# Patient Record
Sex: Male | Born: 1950 | Race: White | Hispanic: No | Smoking: Former smoker
Health system: Southern US, Community
[De-identification: ages and names within clinical notes are randomized; demographics above are authoritative.]

## PROBLEM LIST (undated history)

## (undated) ENCOUNTER — Emergency Department (HOSPITAL_COMMUNITY): Discharge status: Absent without leave | Payer: Self-pay

## (undated) DIAGNOSIS — I350 Nonrheumatic aortic (valve) stenosis: Secondary | ICD-10-CM

## (undated) DIAGNOSIS — M1990 Unspecified osteoarthritis, unspecified site: Secondary | ICD-10-CM

## (undated) DIAGNOSIS — I10 Essential (primary) hypertension: Secondary | ICD-10-CM

## (undated) DIAGNOSIS — I4891 Unspecified atrial fibrillation: Secondary | ICD-10-CM

## (undated) DIAGNOSIS — I700 Atherosclerosis of aorta: Secondary | ICD-10-CM

## (undated) DIAGNOSIS — K219 Gastro-esophageal reflux disease without esophagitis: Secondary | ICD-10-CM

## (undated) DIAGNOSIS — M109 Gout, unspecified: Secondary | ICD-10-CM

## (undated) DIAGNOSIS — I712 Thoracic aortic aneurysm, without rupture, unspecified: Secondary | ICD-10-CM

## (undated) DIAGNOSIS — Q2381 Bicuspid aortic valve: Secondary | ICD-10-CM

## (undated) DIAGNOSIS — Q231 Congenital insufficiency of aortic valve: Secondary | ICD-10-CM

## (undated) DIAGNOSIS — I9789 Other postprocedural complications and disorders of the circulatory system, not elsewhere classified: Principal | ICD-10-CM

## (undated) DIAGNOSIS — R0600 Dyspnea, unspecified: Secondary | ICD-10-CM

## (undated) DIAGNOSIS — I351 Nonrheumatic aortic (valve) insufficiency: Secondary | ICD-10-CM

## (undated) HISTORY — DX: Gastro-esophageal reflux disease without esophagitis: K21.9

## (undated) HISTORY — DX: Atherosclerosis of aorta: I70.0

## (undated) HISTORY — DX: Other postprocedural complications and disorders of the circulatory system, not elsewhere classified: I48.91

## (undated) HISTORY — DX: Nonrheumatic aortic (valve) stenosis: I35.0

## (undated) HISTORY — DX: Thoracic aortic aneurysm, without rupture: I71.2

## (undated) HISTORY — DX: Essential (primary) hypertension: I10

## (undated) HISTORY — DX: Bicuspid aortic valve: Q23.81

## (undated) HISTORY — PX: HERNIA REPAIR: SHX51

## (undated) HISTORY — DX: Other postprocedural complications and disorders of the circulatory system, not elsewhere classified: I97.89

## (undated) HISTORY — DX: Congenital insufficiency of aortic valve: Q23.1

## (undated) HISTORY — DX: Thoracic aortic aneurysm, without rupture, unspecified: I71.20

## (undated) HISTORY — DX: Nonrheumatic aortic (valve) insufficiency: I35.1

---

## 2012-11-26 ENCOUNTER — Ambulatory Visit: Payer: Self-pay | Admitting: Family Medicine

## 2012-12-17 ENCOUNTER — Ambulatory Visit: Payer: Self-pay | Admitting: Family Medicine

## 2013-06-17 ENCOUNTER — Emergency Department (HOSPITAL_COMMUNITY): Payer: Medicare Other

## 2013-06-17 ENCOUNTER — Encounter (HOSPITAL_COMMUNITY): Payer: Self-pay

## 2013-06-17 ENCOUNTER — Emergency Department (HOSPITAL_COMMUNITY)
Admission: EM | Admit: 2013-06-17 | Discharge: 2013-06-17 | Disposition: A | Discharge status: Home / Self Care | Payer: Medicare Other | Attending: Emergency Medicine | Admitting: Emergency Medicine

## 2013-06-17 DIAGNOSIS — G8929 Other chronic pain: Secondary | ICD-10-CM | POA: Insufficient documentation

## 2013-06-17 DIAGNOSIS — R141 Gas pain: Secondary | ICD-10-CM | POA: Insufficient documentation

## 2013-06-17 DIAGNOSIS — I10 Essential (primary) hypertension: Secondary | ICD-10-CM | POA: Insufficient documentation

## 2013-06-17 DIAGNOSIS — K219 Gastro-esophageal reflux disease without esophagitis: Secondary | ICD-10-CM | POA: Insufficient documentation

## 2013-06-17 DIAGNOSIS — R143 Flatulence: Secondary | ICD-10-CM | POA: Insufficient documentation

## 2013-06-17 DIAGNOSIS — R110 Nausea: Secondary | ICD-10-CM | POA: Insufficient documentation

## 2013-06-17 DIAGNOSIS — R1084 Generalized abdominal pain: Secondary | ICD-10-CM

## 2013-06-17 DIAGNOSIS — M109 Gout, unspecified: Secondary | ICD-10-CM | POA: Insufficient documentation

## 2013-06-17 DIAGNOSIS — Z79899 Other long term (current) drug therapy: Secondary | ICD-10-CM | POA: Insufficient documentation

## 2013-06-17 DIAGNOSIS — Z791 Long term (current) use of non-steroidal anti-inflammatories (NSAID): Secondary | ICD-10-CM | POA: Insufficient documentation

## 2013-06-17 DIAGNOSIS — M129 Arthropathy, unspecified: Secondary | ICD-10-CM | POA: Insufficient documentation

## 2013-06-17 DIAGNOSIS — R142 Eructation: Secondary | ICD-10-CM | POA: Insufficient documentation

## 2013-06-17 DIAGNOSIS — Z9889 Other specified postprocedural states: Secondary | ICD-10-CM | POA: Insufficient documentation

## 2013-06-17 HISTORY — DX: Gout, unspecified: M10.9

## 2013-06-17 HISTORY — DX: Unspecified osteoarthritis, unspecified site: M19.90

## 2013-06-17 LAB — COMPREHENSIVE METABOLIC PANEL WITH GFR
ALT: 52 U/L (ref 0–53)
AST: 34 U/L (ref 0–37)
Albumin: 4.1 g/dL (ref 3.5–5.2)
Alkaline Phosphatase: 97 U/L (ref 39–117)
BUN: 24 mg/dL — ABNORMAL HIGH (ref 6–23)
CO2: 31 meq/L (ref 19–32)
Calcium: 9.8 mg/dL (ref 8.4–10.5)
Chloride: 102 meq/L (ref 96–112)
Creatinine, Ser: 1.19 mg/dL (ref 0.50–1.35)
GFR calc Af Amer: 74 mL/min — ABNORMAL LOW
GFR calc non Af Amer: 64 mL/min — ABNORMAL LOW
Glucose, Bld: 107 mg/dL — ABNORMAL HIGH (ref 70–99)
Potassium: 4.6 meq/L (ref 3.5–5.1)
Sodium: 139 meq/L (ref 135–145)
Total Bilirubin: 0.8 mg/dL (ref 0.3–1.2)
Total Protein: 7 g/dL (ref 6.0–8.3)

## 2013-06-17 LAB — CBC WITH DIFFERENTIAL/PLATELET
Basophils Absolute: 0.1 10*3/uL (ref 0.0–0.1)
Basophils Relative: 1 % (ref 0–1)
Eosinophils Absolute: 0.1 10*3/uL (ref 0.0–0.7)
Eosinophils Relative: 2 % (ref 0–5)
HCT: 46.9 % (ref 39.0–52.0)
Hemoglobin: 16 g/dL (ref 13.0–17.0)
Lymphocytes Relative: 52 % — ABNORMAL HIGH (ref 12–46)
Lymphs Abs: 2.8 10*3/uL (ref 0.7–4.0)
MCH: 32.2 pg (ref 26.0–34.0)
MCHC: 34.1 g/dL (ref 30.0–36.0)
MCV: 94.4 fL (ref 78.0–100.0)
Monocytes Absolute: 0.5 10*3/uL (ref 0.1–1.0)
Monocytes Relative: 9 % (ref 3–12)
Neutro Abs: 1.9 10*3/uL (ref 1.7–7.7)
Neutrophils Relative %: 36 % — ABNORMAL LOW (ref 43–77)
Platelets: 218 10*3/uL (ref 150–400)
RBC: 4.97 MIL/uL (ref 4.22–5.81)
RDW: 13.9 % (ref 11.5–15.5)
WBC: 5.3 10*3/uL (ref 4.0–10.5)

## 2013-06-17 LAB — URINALYSIS, ROUTINE W REFLEX MICROSCOPIC
Bilirubin Urine: NEGATIVE
Glucose, UA: NEGATIVE mg/dL
Hgb urine dipstick: NEGATIVE
Ketones, ur: NEGATIVE mg/dL
Leukocytes, UA: NEGATIVE
Nitrite: NEGATIVE
Protein, ur: NEGATIVE mg/dL
Specific Gravity, Urine: 1.01 (ref 1.005–1.030)
Urobilinogen, UA: 0.2 mg/dL (ref 0.0–1.0)
pH: 6 (ref 5.0–8.0)

## 2013-06-17 LAB — LIPASE, BLOOD: Lipase: 24 U/L (ref 11–59)

## 2013-06-17 MED ORDER — HYOSCYAMINE SULFATE 0.125 MG SL SUBL: 0.1250 mg | SUBLINGUAL_TABLET | SUBLINGUAL | Status: DC | PRN

## 2013-06-17 MED ORDER — IOHEXOL 300 MG/ML  SOLN
100.0000 mL | Freq: Once | INTRAMUSCULAR | Status: AC | PRN
  Administered 2013-06-17: 100 mL via INTRAVENOUS

## 2013-06-17 MED ORDER — FAMOTIDINE 40 MG PO TABS: 20.0000 mg | ORAL_TABLET | Freq: Two times a day (BID) | ORAL | Status: DC

## 2013-06-17 MED ORDER — IOHEXOL 300 MG/ML  SOLN
50.0000 mL | Freq: Once | INTRAMUSCULAR | Status: AC | PRN
  Administered 2013-06-17: 50 mL via ORAL

## 2013-06-17 NOTE — ED Notes (Signed)
Pt complain of abdominal pain that is chronic in nature. Also, complain of nausea

## 2013-06-17 NOTE — ED Provider Notes (Signed)
Medical screening examination/treatment/procedure(s) were performed by non-physician practitioner and as supervising physician I was immediately available for consultation/collaboration.  Jamilyn Pigeon, MD 06/17/13 1335 

## 2013-06-17 NOTE — ED Provider Notes (Signed)
CSN: [id number]     Arrival date & time 06/17/13  7829 History     First MD Initiated Contact with Patient 06/17/13 [phone]     Chief Complaint  Patient presents with  . Abdominal Pain   (Consider location/radiation/quality/duration/timing/severity/associated sxs/prior Treatment) HPI Comments: Lawrence [name] is a 62 y.o. Male presenting with intermittent episodes of generalized abdominal cramping pain,  Stating it often wakes him at night,  Last occurred last night lasting several hours.  He describes abdominal distention and sometimes increased flatulence and nausea during these episodes, occurs about every other night on average for the past 2 months.  He was told he has a liver "problem" as he used to be a heavy drinker,  But not currently.  He denies bowel changes, no constipation, diarrhea or bloody stools,  Last bm was yesterday and normal.  He has found no alleviators and no pattern to episodes or food intake, no recent changes in diet.  He denies chest pain and shortness of breath.  He has taken no medicines for his symptoms and last felt this complaint last night.     The history is provided by the patient.    Past Medical History  Diagnosis Date  . Arthritis   . Hypertension   . Acid reflux disease   . Gout    Past Surgical History  Procedure Laterality Date  . Hernia repair     No family history on file. History  Substance Use Topics  . Smoking status: Never Smoker   . Smokeless tobacco: Not on file  . Alcohol Use: Yes    Review of Systems  Constitutional: Negative for fever, activity change and appetite change.  HENT: Negative for congestion, sore throat and neck pain.   Eyes: Negative.   Respiratory: Negative for chest tightness and shortness of breath.   Cardiovascular: Negative for chest pain.  Gastrointestinal: Positive for nausea, abdominal pain and abdominal distention. Negative for vomiting, diarrhea and constipation.  Genitourinary: Negative.    Musculoskeletal: Negative for joint swelling and arthralgias.  Skin: Negative.  Negative for rash and wound.  Neurological: Negative for dizziness, weakness, light-headedness, numbness and headaches.  Psychiatric/Behavioral: Negative.     Allergies  Morphine and related  Home Medications   Current Outpatient Rx  Name  Route  Sig  Dispense  Refill  . allopurinol (ZYLOPRIM) 300 MG tablet   Oral   Take 300 mg by mouth daily.         . cyclobenzaprine (FLEXERIL) 5 MG tablet   Oral   Take 5 mg by mouth 3 (three) times daily as needed for muscle spasms.         Marland Kitchen ibuprofen (ADVIL,MOTRIN) 600 MG tablet   Oral   Take 600 mg by mouth every 6 (six) hours as needed for pain.         Marland Kitchen lisinopril (PRINIVIL,ZESTRIL) 20 MG tablet   Oral   Take 20 mg by mouth daily.         . Multiple Vitamin (MULTIVITAMIN WITH MINERALS) TABS tablet   Oral   Take 1 tablet by mouth daily.         . famotidine (PEPCID) 40 MG tablet   Oral   Take 0.5 tablets (20 mg total) by mouth 2 (two) times daily.   28 tablet   0   . hyoscyamine (LEVSIN/SL) 0.125 MG SL tablet   Sublingual   Place 1 tablet (0.125 mg total) under the tongue every 4 (four) hours  as needed for cramping.   30 tablet   0    BP 138/86  Pulse 75  Temp(Src) 98.6 F (37 C) (Oral)  Resp 18  Ht 6\' 1"  (1.854 m)  Wt 214 lb (97.07 kg)  BMI 28.24 kg/m2  SpO2 98% Physical Exam  Nursing note and vitals reviewed. Constitutional: He appears well-developed and well-nourished.  HENT:  Head: Normocephalic and atraumatic.  Eyes: Conjunctivae are normal.  Neck: Normal range of motion.  Cardiovascular: Normal rate, regular rhythm, normal heart sounds and intact distal pulses.   Pulmonary/Chest: Effort normal and breath sounds normal. He has no wheezes.  Abdominal: Soft. He exhibits distension. He exhibits no pulsatile liver, no fluid wave, no abdominal bruit, no pulsatile midline mass and no mass. Bowel sounds are increased. There  is no hepatosplenomegaly. There is no tenderness. There is no rebound, no guarding and no CVA tenderness.  Musculoskeletal: Normal range of motion.  Neurological: He is alert.  Skin: Skin is warm and dry.  Psychiatric: He has a normal mood and affect.    ED Course   Procedures (including critical care time)  Labs Reviewed  CBC WITH DIFFERENTIAL - Abnormal; Notable for the following:    Neutrophils Relative % 36 (*)    Lymphocytes Relative 52 (*)    All other components within normal limits  COMPREHENSIVE METABOLIC PANEL - Abnormal; Notable for the following:    Glucose, Bld 107 (*)    BUN 24 (*)    GFR calc non Af Amer 64 (*)    GFR calc Af Amer 74 (*)    All other components within normal limits  LIPASE, BLOOD  URINALYSIS, ROUTINE W REFLEX MICROSCOPIC   Ct Abdomen Pelvis W Contrast  06/17/2013   *RADIOLOGY REPORT*  Clinical Data: Abdomen pain  CT ABDOMEN AND PELVIS WITH CONTRAST  Technique:  Multidetector CT imaging of the abdomen and pelvis was performed following the standard protocol during bolus administration of intravenous contrast.  Contrast: 50mL OMNIPAQUE IOHEXOL 300 MG/ML  SOLN, OMNIPAQUE IOHEXOL 300 MG/ML  SOLN  Comparison: None.  Findings: There is diffuse fatty infiltration of liver.  The liver is otherwise normal.  The gallbladder, spleen, pancreas, adrenal glands, kidneys are normal.  There is mild chronic bilateral perinephric stranding.  There is no hydronephrosis bilaterally. There is atherosclerosis of the abdominal aorta without aneurysmal dilatation.  There is no abdominal lymphadenopathy.  There is no small bowel obstruction or diverticulitis.  The appendix is normal.  Images of the pelvis demonstrate fluid filled bladder without abnormality.  There is minor dependent atelectasis of the visualized posterior lungs.  There are degenerative joint changes of the spine.  No acute abnormality is identified in the visualized bony structures.  IMPRESSION: No acute  abnormality identified in the abdomen and pelvis.  Diffuse fatty infiltration of liver.   Original Report Authenticated By: Sherian Rein, M.D.   1. Abdominal pain, chronic, generalized     MDM  Discussed with Dr. Judd Lien prior to dc home.  Prescribed pepcid, hyoscyamine,  Encouraged f/u with GI for consideration of colonoscopy.  Patients labs and/or radiological studies were viewed and considered during the medical decision making and disposition process.  The patient appears reasonably screened and/or stabilized for discharge and I doubt any other medical condition or other [facility] requiring further screening, evaluation, or treatment in the ED at this time prior to discharge.   Burgess Amor, PA-C 06/17/13 1323

## 2014-11-18 DIAGNOSIS — N529 Male erectile dysfunction, unspecified: Secondary | ICD-10-CM | POA: Diagnosis not present

## 2014-11-18 DIAGNOSIS — I10 Essential (primary) hypertension: Secondary | ICD-10-CM | POA: Diagnosis not present

## 2014-11-18 DIAGNOSIS — M545 Low back pain: Secondary | ICD-10-CM | POA: Diagnosis not present

## 2014-11-22 DIAGNOSIS — E790 Hyperuricemia without signs of inflammatory arthritis and tophaceous disease: Secondary | ICD-10-CM | POA: Diagnosis not present

## 2014-11-22 DIAGNOSIS — I10 Essential (primary) hypertension: Secondary | ICD-10-CM | POA: Diagnosis not present

## 2014-12-11 DIAGNOSIS — Z23 Encounter for immunization: Secondary | ICD-10-CM | POA: Diagnosis not present

## 2014-12-11 DIAGNOSIS — S61211A Laceration without foreign body of left index finger without damage to nail, initial encounter: Secondary | ICD-10-CM | POA: Diagnosis not present

## 2014-12-11 DIAGNOSIS — W270XXA Contact with workbench tool, initial encounter: Secondary | ICD-10-CM | POA: Diagnosis not present

## 2014-12-11 DIAGNOSIS — S60410A Abrasion of right index finger, initial encounter: Secondary | ICD-10-CM | POA: Diagnosis not present

## 2014-12-11 DIAGNOSIS — S60411A Abrasion of left index finger, initial encounter: Secondary | ICD-10-CM | POA: Diagnosis not present

## 2014-12-11 DIAGNOSIS — Z79899 Other long term (current) drug therapy: Secondary | ICD-10-CM | POA: Diagnosis not present

## 2014-12-11 DIAGNOSIS — I10 Essential (primary) hypertension: Secondary | ICD-10-CM | POA: Diagnosis not present

## 2014-12-11 DIAGNOSIS — Z87891 Personal history of nicotine dependence: Secondary | ICD-10-CM | POA: Diagnosis not present

## 2015-05-23 DIAGNOSIS — N529 Male erectile dysfunction, unspecified: Secondary | ICD-10-CM | POA: Diagnosis not present

## 2015-05-23 DIAGNOSIS — I10 Essential (primary) hypertension: Secondary | ICD-10-CM | POA: Diagnosis not present

## 2015-05-29 DIAGNOSIS — E782 Mixed hyperlipidemia: Secondary | ICD-10-CM | POA: Diagnosis not present

## 2015-05-29 DIAGNOSIS — D72829 Elevated white blood cell count, unspecified: Secondary | ICD-10-CM | POA: Diagnosis not present

## 2015-05-29 DIAGNOSIS — R7301 Impaired fasting glucose: Secondary | ICD-10-CM | POA: Diagnosis not present

## 2015-05-29 DIAGNOSIS — I10 Essential (primary) hypertension: Secondary | ICD-10-CM | POA: Diagnosis not present

## 2015-08-24 DIAGNOSIS — Q8500 Neurofibromatosis, unspecified: Secondary | ICD-10-CM | POA: Diagnosis not present

## 2015-08-24 DIAGNOSIS — L720 Epidermal cyst: Secondary | ICD-10-CM | POA: Diagnosis not present

## 2015-09-02 DIAGNOSIS — J302 Other seasonal allergic rhinitis: Secondary | ICD-10-CM | POA: Diagnosis not present

## 2015-09-27 DIAGNOSIS — L720 Epidermal cyst: Secondary | ICD-10-CM | POA: Diagnosis not present

## 2015-11-22 DIAGNOSIS — E7800 Pure hypercholesterolemia, unspecified: Secondary | ICD-10-CM | POA: Diagnosis not present

## 2015-11-22 DIAGNOSIS — D72829 Elevated white blood cell count, unspecified: Secondary | ICD-10-CM | POA: Diagnosis not present

## 2015-11-22 DIAGNOSIS — I10 Essential (primary) hypertension: Secondary | ICD-10-CM | POA: Diagnosis not present

## 2015-11-22 DIAGNOSIS — R7301 Impaired fasting glucose: Secondary | ICD-10-CM | POA: Diagnosis not present

## 2015-12-01 DIAGNOSIS — E782 Mixed hyperlipidemia: Secondary | ICD-10-CM | POA: Diagnosis not present

## 2015-12-01 DIAGNOSIS — M25559 Pain in unspecified hip: Secondary | ICD-10-CM | POA: Diagnosis not present

## 2015-12-01 DIAGNOSIS — R7301 Impaired fasting glucose: Secondary | ICD-10-CM | POA: Diagnosis not present

## 2015-12-01 DIAGNOSIS — I10 Essential (primary) hypertension: Secondary | ICD-10-CM | POA: Diagnosis not present

## 2016-02-13 DIAGNOSIS — I10 Essential (primary) hypertension: Secondary | ICD-10-CM | POA: Diagnosis not present

## 2016-06-10 DIAGNOSIS — E782 Mixed hyperlipidemia: Secondary | ICD-10-CM | POA: Diagnosis not present

## 2016-06-10 DIAGNOSIS — R7301 Impaired fasting glucose: Secondary | ICD-10-CM | POA: Diagnosis not present

## 2016-06-12 DIAGNOSIS — I10 Essential (primary) hypertension: Secondary | ICD-10-CM | POA: Diagnosis not present

## 2016-06-12 DIAGNOSIS — M25559 Pain in unspecified hip: Secondary | ICD-10-CM | POA: Diagnosis not present

## 2016-06-12 DIAGNOSIS — M542 Cervicalgia: Secondary | ICD-10-CM | POA: Diagnosis not present

## 2016-06-12 DIAGNOSIS — E782 Mixed hyperlipidemia: Secondary | ICD-10-CM | POA: Diagnosis not present

## 2016-06-12 DIAGNOSIS — Z86010 Personal history of colonic polyps: Secondary | ICD-10-CM | POA: Diagnosis not present

## 2016-06-12 DIAGNOSIS — R7301 Impaired fasting glucose: Secondary | ICD-10-CM | POA: Diagnosis not present

## 2016-07-01 ENCOUNTER — Telehealth: Payer: Self-pay

## 2016-07-01 NOTE — Telephone Encounter (Signed)
PT left Vm he is ready to schedule the colonoscopy. Can call back this afternoon.

## 2016-07-04 ENCOUNTER — Telehealth: Payer: Self-pay | Admitting: Gastroenterology

## 2016-07-04 NOTE — Telephone Encounter (Signed)
Pt was returning a call for DS. Please call [phone]

## 2016-07-09 ENCOUNTER — Telehealth: Payer: Self-pay

## 2016-07-09 NOTE — Telephone Encounter (Signed)
I called pt and he has been having colonoscopies every 3-5 years and at different places. He has a history of polyps. He will try to get some of the records and call me back to schedule an OV appt.

## 2016-07-09 NOTE — Telephone Encounter (Signed)
Pt is needing to be triaged. He said he hasn't received a call from nurse yet. Please call [phone]

## 2016-07-09 NOTE — Telephone Encounter (Signed)
See previous note. I have contacted the pt.

## 2016-07-10 NOTE — Telephone Encounter (Signed)
See separate note, he has OV appt.

## 2016-07-10 NOTE — Telephone Encounter (Signed)
Pt came by the office and signed release for previous colonoscopy reports. I have scheduled him an OV with Walden Field, NP on 08/01/2016 at 9:30 AM.

## 2016-08-01 ENCOUNTER — Ambulatory Visit (INDEPENDENT_AMBULATORY_CARE_PROVIDER_SITE_OTHER): Payer: Commercial Managed Care - HMO | Admitting: Nurse Practitioner

## 2016-08-01 ENCOUNTER — Encounter: Payer: Self-pay | Admitting: Nurse Practitioner

## 2016-08-01 DIAGNOSIS — Z86010 Personal history of colonic polyps: Secondary | ICD-10-CM | POA: Insufficient documentation

## 2016-08-01 NOTE — Progress Notes (Addendum)
REVIEWED-NO ADDITIONAL RECOMMENDATIONS.Marland Kitchen  Primary Care Physician:  Wende Neighbors, MD Primary Gastroenterologist:  Dr. Oneida Alar  Chief Complaint  Patient presents with  . Colonoscopy    hx polyps    HPI:   Lawrence [name] is a 65 y.o. male who presents to schedule colonoscopy. He is recently moved to this area. Last colonoscopy completed up [place]. Review of records finds last colonoscopy and treated on 01/21/2012 which found a transverse colon polyp which was removed and sent to pathology and found tubular adenoma without carcinoma. Recommended repeat colonoscopy every 3 years due to family history.  Today he states he's doing wel overall. Some RLQ abdominal pain in the past couple days, feels like when he had a hernia previously. Denies N/V, hematochezia, melena, fever, chills, acute changes in bowel habits. GERD symptoms well controlled currently without PPI or H2RA. Denies chest pain, dyspnea, dizziness, lightheadedness, syncope, near syncope. Denies any other upper or lower GI symptoms.  Past Medical History:  Diagnosis Date  . Acid reflux disease   . Arthritis   . Gout   . Hypertension     Past Surgical History:  Procedure Laterality Date  . HERNIA REPAIR      Current Outpatient Prescriptions  Medication Sig Dispense Refill  . allopurinol (ZYLOPRIM) 300 MG tablet Take 300 mg by mouth as needed.     . cyclobenzaprine (FLEXERIL) 5 MG tablet Take 5 mg by mouth 3 (three) times daily as needed for muscle spasms.    Marland Kitchen ibuprofen (ADVIL,MOTRIN) 600 MG tablet Take 600 mg by mouth every 6 (six) hours as needed for pain.    Marland Kitchen lisinopril (PRINIVIL,ZESTRIL) 20 MG tablet Take 20 mg by mouth daily.    . famotidine (PEPCID) 40 MG tablet Take 0.5 tablets (20 mg total) by mouth 2 (two) times daily. (Patient not taking: Reported on 08/01/2016) 28 tablet 0  . hyoscyamine (LEVSIN/SL) 0.125 MG SL tablet Place 1 tablet (0.125 mg total) under the tongue every 4 (four) hours as needed for cramping.  (Patient not taking: Reported on 08/01/2016) 30 tablet 0  . Multiple Vitamin (MULTIVITAMIN WITH MINERALS) TABS tablet Take 1 tablet by mouth daily.     No current facility-administered medications for this visit.     Allergies as of 08/01/2016 - Review Complete 08/01/2016  Allergen Reaction Noted  . Morphine and related Other (See Comments) 06/17/2013    No family history on file.  Social History   Social History  . Marital status: Single    Spouse name: N/A  . Number of children: N/A  . Years of education: N/A   Occupational History  . Not on file.   Social History Main Topics  . Smoking status: Never Smoker  . Smokeless tobacco: Not on file  . Alcohol use Yes  . Drug use: No  . Sexual activity: Not on file   Other Topics Concern  . Not on file   Social History Narrative  . No narrative on file    Review of Systems: 10-point ROS negative except as per HPI.    Physical Exam: BP (!) 156/96   Pulse (!) 53   Temp 97.6 F (36.4 C) (Oral)   Ht 5\' 11"  (1.803 m)   Wt 208 lb 12.8 oz (94.7 kg)   BMI 29.12 kg/m  General:   Alert and oriented. Pleasant and cooperative. Well-nourished and well-developed.  Head:  Normocephalic and atraumatic. Eyes:  Without icterus, sclera clear and conjunctiva pink.  Ears:  Normal auditory acuity.  Cardiovascular:  S1, S2 present without murmurs appreciated. Extremities without clubbing or edema. Respiratory:  Clear to auscultation bilaterally. No wheezes, rales, or rhonchi. No distress.  Gastrointestinal:  +BS, soft, non-tender and non-distended. No HSM noted. No guarding or rebound. No masses appreciated.  Rectal:  Deferred  Musculoskalatal:  Symmetrical without gross deformities. Neurologic:  Alert and oriented x4;  grossly normal neurologically. Psych:  Alert and cooperative. Normal mood and affect. Heme/Lymph/Immune: No excessive bruising noted.    08/01/2016 9:39 AM   Disclaimer: This note was dictated with voice  recognition software. Similar sounding words can inadvertently be transcribed and may not be corrected upon review.

## 2016-08-01 NOTE — Patient Instructions (Signed)
1. We will schedule your procedure for you. 2. Return for follow-up based on postprocedure recommendations. 

## 2016-08-01 NOTE — Progress Notes (Signed)
CC'ED TO PCP 

## 2016-08-01 NOTE — Assessment & Plan Note (Signed)
The patient has a personal history of colon polyps. Last removed and 2013 found to be tubular adenoma. Recommended 3 year repeat colonoscopy, he is currently about 1 year overdue. At this point we will proceed with recommended repeat colonoscopy. Generally asymptomatic from a GI standpoint.  Proceed with colonoscopy with Dr. Oneida Alar in the near future. The risks, benefits, and alternatives have been discussed in detail with the patient. They state understanding and desire to proceed.   The patient is not on any anticoagulants, anxiolytics, chronic pain medications, or antidepressants. He does drink occasionally, typically while watching a football game or for sporting events with up to 4 drinks per sitting. We will add 25 mg preprocedure Phenergan to promote adequate sedation.

## 2016-08-05 ENCOUNTER — Other Ambulatory Visit: Payer: Self-pay

## 2016-08-05 DIAGNOSIS — Z86010 Personal history of colonic polyps: Secondary | ICD-10-CM

## 2016-08-05 MED ORDER — PEG 3350-KCL-NA BICARB-NACL 420 G PO SOLR: 4000.0000 mL | ORAL | 0 refills | Status: DC

## 2016-08-29 ENCOUNTER — Encounter (HOSPITAL_COMMUNITY)
Admission: RE | Disposition: A | Discharge status: Home / Self Care | Payer: Self-pay | Source: Ambulatory Visit | Attending: Gastroenterology

## 2016-08-29 ENCOUNTER — Ambulatory Visit (HOSPITAL_COMMUNITY)
Admission: RE | Admit: 2016-08-29 | Discharge: 2016-08-29 | Disposition: A | Discharge status: Home / Self Care | Payer: Commercial Managed Care - HMO | Source: Ambulatory Visit | Attending: Gastroenterology | Admitting: Gastroenterology

## 2016-08-29 ENCOUNTER — Encounter (HOSPITAL_COMMUNITY): Payer: Self-pay | Admitting: *Deleted

## 2016-08-29 DIAGNOSIS — Z1211 Encounter for screening for malignant neoplasm of colon: Secondary | ICD-10-CM | POA: Insufficient documentation

## 2016-08-29 DIAGNOSIS — Z87891 Personal history of nicotine dependence: Secondary | ICD-10-CM | POA: Insufficient documentation

## 2016-08-29 DIAGNOSIS — D123 Benign neoplasm of transverse colon: Secondary | ICD-10-CM

## 2016-08-29 DIAGNOSIS — M109 Gout, unspecified: Secondary | ICD-10-CM | POA: Insufficient documentation

## 2016-08-29 DIAGNOSIS — K648 Other hemorrhoids: Secondary | ICD-10-CM | POA: Diagnosis not present

## 2016-08-29 DIAGNOSIS — Z79899 Other long term (current) drug therapy: Secondary | ICD-10-CM | POA: Insufficient documentation

## 2016-08-29 DIAGNOSIS — Q438 Other specified congenital malformations of intestine: Secondary | ICD-10-CM | POA: Insufficient documentation

## 2016-08-29 DIAGNOSIS — K5730 Diverticulosis of large intestine without perforation or abscess without bleeding: Secondary | ICD-10-CM | POA: Insufficient documentation

## 2016-08-29 DIAGNOSIS — Z86010 Personal history of colonic polyps: Secondary | ICD-10-CM | POA: Diagnosis not present

## 2016-08-29 DIAGNOSIS — I10 Essential (primary) hypertension: Secondary | ICD-10-CM | POA: Insufficient documentation

## 2016-08-29 HISTORY — PX: COLONOSCOPY: SHX5424

## 2016-08-29 SURGERY — COLONOSCOPY
Anesthesia: Moderate Sedation

## 2016-08-29 MED ORDER — PROMETHAZINE HCL 25 MG/ML IJ SOLN
25.0000 mg | Freq: Once | INTRAMUSCULAR | Status: AC
  Administered 2016-08-29: 25 mg via INTRAVENOUS

## 2016-08-29 MED ORDER — SODIUM CHLORIDE 0.9% FLUSH
INTRAVENOUS | Status: AC
  Filled 2016-08-29: qty 10

## 2016-08-29 MED ORDER — MIDAZOLAM HCL 5 MG/5ML IJ SOLN
INTRAMUSCULAR | Status: AC
  Filled 2016-08-29: qty 10

## 2016-08-29 MED ORDER — MEPERIDINE HCL 100 MG/ML IJ SOLN
INTRAMUSCULAR | Status: DC | PRN
  Administered 2016-08-29 (×3): 25 mg via INTRAVENOUS

## 2016-08-29 MED ORDER — SODIUM CHLORIDE 0.9 % IV SOLN
INTRAVENOUS | Status: DC
  Administered 2016-08-29: 10:00:00 via INTRAVENOUS

## 2016-08-29 MED ORDER — PROMETHAZINE HCL 25 MG/ML IJ SOLN
INTRAMUSCULAR | Status: AC
  Filled 2016-08-29: qty 1

## 2016-08-29 MED ORDER — STERILE WATER FOR IRRIGATION IR SOLN
Status: DC | PRN
  Administered 2016-08-29: 2.5 mL

## 2016-08-29 MED ORDER — MIDAZOLAM HCL 5 MG/5ML IJ SOLN
INTRAMUSCULAR | Status: DC | PRN
  Administered 2016-08-29 (×3): 2 mg via INTRAVENOUS

## 2016-08-29 MED ORDER — MEPERIDINE HCL 100 MG/ML IJ SOLN
INTRAMUSCULAR | Status: AC
  Filled 2016-08-29: qty 2

## 2016-08-29 NOTE — Discharge Instructions (Signed)
You have MODERATE linternal hemorrhoids. YOU HAVE diverticulosis IN YOUR LEFT COLON. YOU HAD ONE POLYP REMOVED. YOU HAVE AN EXTREMELY REDUNDANT LEFT COLON. YOUR PREP IN THE RIGHT COLON WAS LESS THAN IDEAL.    DRINK WATER TO KEEP YOUR URINE LIGHT YELLOW.  FOLLOW A HIGH FIBER DIET. AVOID ITEMS THAT CAUSE BLOATING. See info below.  CONTINUE YOUR WEIGHT LOSS EFFORTS. LOSE TEN POUNDS.  USE PREPARATION H FOUR TIMES  A DAY IF NEEDED TO RELIEVE RECTAL PAIN/PRESSURE/BLEEDING.   YOUR BIOPSY RESULTS WILL BE AVAILABLE IN MY CHART  OCT 23 AND MY OFFICE WILL CONTACT YOU IN 10-14 DAYS WITH YOUR RESULTS.   Next colonoscopy in 1-3 years.  Colonoscopy Care After Read the instructions outlined below and refer to this sheet in the next week. These discharge instructions provide you with general information on caring for yourself after you leave the hospital. While your treatment has been planned according to the most current medical practices available, unavoidable complications occasionally occur. If you have any problems or questions after discharge, call DR. Cherith Tewell, [phone].  ACTIVITY  You may resume your regular activity, but move at a slower pace for the next 24 hours.   Take frequent rest periods for the next 24 hours.   Walking will help get rid of the air and reduce the bloated feeling in your belly (abdomen).   No driving for 24 hours (because of the medicine (anesthesia) used during the test).   You may shower.   Do not sign any important legal documents or operate any machinery for 24 hours (because of the anesthesia used during the test).    NUTRITION  Drink plenty of fluids.   You may resume your normal diet as instructed by your doctor.   Begin with a light meal and progress to your normal diet. Heavy or fried foods are harder to digest and may make you feel sick to your stomach (nauseated).   Avoid alcoholic beverages for 24 hours or as instructed.    MEDICATIONS  You  may resume your normal medications.   WHAT YOU CAN EXPECT TODAY  Some feelings of bloating in the abdomen.   Passage of more gas than usual.   Spotting of blood in your stool or on the toilet paper  .  IF YOU HAD POLYPS REMOVED DURING THE COLONOSCOPY:  Eat a soft diet IF YOU HAVE NAUSEA, BLOATING, ABDOMINAL PAIN, OR VOMITING.    FINDING OUT THE RESULTS OF YOUR TEST Not all test results are available during your visit. DR. Oneida Alar WILL CALL YOU WITHIN 14 DAYS OF YOUR PROCEDUE WITH YOUR RESULTS. Do not assume everything is normal if you have not heard from DR. Rivka Baune, CALL HER OFFICE AT [phone].  SEEK IMMEDIATE MEDICAL ATTENTION AND CALL THE OFFICE: [phone] IF:  You have more than a spotting of blood in your stool.   Your belly is swollen (abdominal distention).   You are nauseated or vomiting.   You have a temperature over 101F.   You have abdominal pain or discomfort that is severe or gets worse throughout the day.  High-Fiber Diet A high-fiber diet changes your normal diet to include more whole grains, legumes, fruits, and vegetables. Changes in the diet involve replacing refined carbohydrates with unrefined foods. The calorie level of the diet is essentially unchanged. The Dietary Reference Intake (recommended amount) for adult males is 38 grams per day. For adult females, it is 25 grams per day. Pregnant and lactating women should consume 28 grams of  fiber per day. Fiber is the intact part of a plant that is not broken down during digestion. Functional fiber is fiber that has been isolated from the plant to provide a beneficial effect in the body. PURPOSE  Increase stool bulk.   Ease and regulate bowel movements.   Lower cholesterol.  REDUCE RISK OF COLON CANCER  INDICATIONS THAT YOU NEED MORE FIBER  Constipation and hemorrhoids.   Uncomplicated diverticulosis (intestine condition) and irritable bowel syndrome.   Weight management.   As a protective  measure against hardening of the arteries (atherosclerosis), diabetes, and cancer.   GUIDELINES FOR INCREASING FIBER IN THE DIET  Start adding fiber to the diet slowly. A gradual increase of about 5 more grams (2 slices of whole-wheat bread, 2 servings of most fruits or vegetables, or 1 bowl of high-fiber cereal) per day is best. Too rapid an increase in fiber may result in constipation, flatulence, and bloating.   Drink enough water and fluids to keep your urine clear or pale yellow. Water, juice, or caffeine-free drinks are recommended. Not drinking enough fluid may cause constipation.   Eat a variety of high-fiber foods rather than one type of fiber.   Try to increase your intake of fiber through using high-fiber foods rather than fiber pills or supplements that contain small amounts of fiber.   The goal is to change the types of food eaten. Do not supplement your present diet with high-fiber foods, but replace foods in your present diet.   INCLUDE A VARIETY OF FIBER SOURCES  Replace refined and processed grains with whole grains, canned fruits with fresh fruits, and incorporate other fiber sources. White rice, white breads, and most bakery goods contain little or no fiber.   Brown whole-grain rice, buckwheat oats, and many fruits and vegetables are all good sources of fiber. These include: broccoli, Brussels sprouts, cabbage, cauliflower, beets, sweet potatoes, white potatoes (skin on), carrots, tomatoes, eggplant, squash, berries, fresh fruits, and dried fruits.   Cereals appear to be the richest source of fiber. Cereal fiber is found in whole grains and bran. Bran is the fiber-rich outer coat of cereal grain, which is largely removed in refining. In whole-grain cereals, the bran remains. In breakfast cereals, the largest amount of fiber is found in those with "bran" in their names. The fiber content is sometimes indicated on the label.   You may need to include additional fruits and  vegetables each day.   In baking, for 1 cup white flour, you may use the following substitutions:   1 cup whole-wheat flour minus 2 tablespoons.   1/2 cup white flour plus 1/2 cup whole-wheat flour.   Polyps, Colon  A polyp is extra tissue that grows inside your body. Colon polyps grow in the large intestine. The large intestine, also called the colon, is part of your digestive system. It is a long, hollow tube at the end of your digestive tract where your body makes and stores stool. Most polyps are not dangerous. They are benign. This means they are not cancerous. But over time, some types of polyps can turn into cancer. Polyps that are smaller than a pea are usually not harmful. But larger polyps could someday become or may already be cancerous. To be safe, doctors remove all polyps and test them.   PREVENTION There is not one sure way to prevent polyps. You might be able to lower your risk of getting them if you:  Eat more fruits and vegetables and less fatty  food.   Do not smoke.   Avoid alcohol.   Exercise every day.   Lose weight if you are overweight.   Eating more calcium and folate can also lower your risk of getting polyps. Some foods that are rich in calcium are milk, cheese, and broccoli. Some foods that are rich in folate are chickpeas, kidney beans, and spinach.    Diverticulosis Diverticulosis is a common condition that develops when small pouches (diverticula) form in the wall of the colon. The risk of diverticulosis increases with age. It happens more often in people who eat a low-fiber diet. Most individuals with diverticulosis have no symptoms. Those individuals with symptoms usually experience belly (abdominal) pain, constipation, or loose stools (diarrhea).  HOME CARE INSTRUCTIONS  Increase the amount of fiber in your diet as directed by your caregiver or dietician. This may reduce symptoms of diverticulosis.   Drink at least 6 to 8 glasses of water each day  to prevent constipation.   Try not to strain when you have a bowel movement.   Avoiding nuts and seeds to prevent complications is NOT NECESSARY.     FOODS HAVING HIGH FIBER CONTENT INCLUDE:  Fruits. Apple, peach, pear, tangerine, raisins, prunes.   Vegetables. Brussels sprouts, asparagus, broccoli, cabbage, carrot, cauliflower, romaine lettuce, spinach, summer squash, tomato, winter squash, zucchini.   Starchy Vegetables. Baked beans, kidney beans, lima beans, split peas, lentils, potatoes (with skin).   Grains. Whole wheat bread, brown rice, bran flake cereal, plain oatmeal, white rice, shredded wheat, bran muffins.    SEEK IMMEDIATE MEDICAL CARE IF:  You develop increasing pain or severe bloating.   You have an oral temperature above 101F.   You develop vomiting or bowel movements that are bloody or black.   Hemorrhoids Hemorrhoids are dilated (enlarged) veins around the rectum. Sometimes clots will form in the veins. This makes them swollen and painful. These are called thrombosed hemorrhoids. Causes of hemorrhoids include:  Constipation.   Straining to have a bowel movement.   HEAVY LIFTING  HOME CARE INSTRUCTIONS  Eat a well balanced diet and drink 6 to 8 glasses of water every day to avoid constipation. You may also use a bulk laxative.   Avoid straining to have bowel movements.   Keep anal area dry and clean.   Do not use a donut shaped pillow or sit on the toilet for long periods. This increases blood pooling and pain.   Move your bowels when your body has the urge; this will require less straining and will decrease pain and pressure.

## 2016-08-29 NOTE — H&P (Signed)
Primary Care Physician:  Wende Neighbors, MD Primary Gastroenterologist:  Dr. Oneida Alar  Pre-Procedure History & Physical: HPI:  Lawrence [name] is a 64 y.o. male here for COLON CANCER SCREENING.  Past Medical History:  Diagnosis Date  . Acid reflux disease   . Arthritis   . Gout   . Hypertension     Past Surgical History:  Procedure Laterality Date  . HERNIA REPAIR      Prior to Admission medications   Medication Sig Start Date End Date Taking? Authorizing Provider  allopurinol (ZYLOPRIM) 300 MG tablet Take 300 mg by mouth daily.    Yes Historical Provider, MD  cyclobenzaprine (FLEXERIL) 5 MG tablet Take 5 mg by mouth at bedtime as needed for muscle spasms.    Yes Historical Provider, MD  ibuprofen (ADVIL,MOTRIN) 600 MG tablet Take 600 mg by mouth every 6 (six) hours as needed for pain.   Yes Historical Provider, MD  lisinopril (PRINIVIL,ZESTRIL) 20 MG tablet Take 20 mg by mouth daily.   Yes Historical Provider, MD  polyethylene glycol-electrolytes (TRILYTE) 420 g solution Take 4,000 mLs by mouth as directed. 08/05/16  Yes Danie Binder, MD    Allergies as of 08/05/2016 - Review Complete 08/01/2016  Allergen Reaction Noted  . Morphine and related Other (See Comments) 06/17/2013    Family History  Problem Relation Age of Onset  . Colon polyps Sister   . Colon cancer Neg Hx     Social History   Social History  . Marital status: Widowed    Spouse name: N/A  . Number of children: N/A  . Years of education: N/A   Occupational History  . Not on file.   Social History Main Topics  . Smoking status: Former Smoker    Packs/day: 2.50    Years: 25.00    Types: Cigarettes    Quit date: 08/02/1991  . Smokeless tobacco: Never Used  . Alcohol use Yes     Comment: Occasionally, with sporting events up to 4 at a time  . Drug use: No  . Sexual activity: Not on file   Other Topics Concern  . Not on file   Social History Narrative  . No narrative on file    Review of  Systems: See HPI, otherwise negative ROS   Physical Exam: BP (!) 157/95   Pulse (!) 53   Temp 98 F (36.7 C) (Oral)   Resp 20   Ht 5\' 11"  (1.803 m)   Wt 209 lb (94.8 kg)   SpO2 94%   BMI 29.15 kg/m  General:   Alert,  pleasant and cooperative in NAD Head:  Normocephalic and atraumatic. Neck:  Supple; Lungs:  Clear throughout to auscultation.    Heart:  Regular rate and rhythm. Abdomen:  Soft, nontender and nondistended. Normal bowel sounds, without guarding, and without rebound.   Neurologic:  Alert and  oriented x4;  grossly normal neurologically.  Impression/Plan:     SCREENING  Plan:  1. TCS TODAY. DISCUSSED PROCEDURE, BENEFITS, & RISKS: < 1% chance of medication reaction, bleeding, perforation, or rupture of spleen/liver.

## 2016-08-29 NOTE — Op Note (Addendum)
[facility] Patient Name: Lawrence [name] Procedure Date: 08/29/2016 10:32 AM MRN: [id number] Date of Birth: April 14, 1951 Attending MD: Barney Drain , MD CSN: [id number] Age: 65 Admit Type: Outpatient Procedure:                Colonoscopy WITH SNARE POLYPECTOMY Indications:              High risk colon cancer surveillance: Personal                            history of colonic polyps Providers:                Barney Drain, MD, Janeece Riggers, RN, Isabella Stalling,                            Technician Referring MD:             Delphina Cahill, MD Medicines:                Promethazine 25 mg IV, Meperidine 75 mg IV,                            Midazolam 6 mg IV Complications:            No immediate complications. Estimated Blood Loss:     Estimated blood loss: none. Procedure:                Pre-Anesthesia Assessment:                           - Prior to the procedure, a History and Physical                            was performed, and patient medications and                            allergies were reviewed. The patient's tolerance of                            previous anesthesia was also reviewed. The risks                            and benefits of the procedure and the sedation                            options and risks were discussed with the patient.                            All questions were answered, and informed consent                            was obtained. Prior Anticoagulants: The patient has                            taken ibuprofen, last dose was 1 day prior to  procedure. ASA Grade Assessment: II - A patient                            with mild systemic disease. After reviewing the                            risks and benefits, the patient was deemed in                            satisfactory condition to undergo the procedure.                            After obtaining informed consent, the colonoscope                            was passed  under direct vision. Throughout the                            procedure, the patient's blood pressure, pulse, and                            oxygen saturations were monitored continuously. The                            [id number]) scope was introduced through                            the anus and advanced to the the cecum, identified                            by appendiceal orifice and ileocecal valve. The                            ileocecal valve, appendiceal orifice, and rectum                            were photographed. The colonoscopy was extremely                            difficult due to significant looping. Successful                            completion of the procedure was aided by changing                            the patient to a supine position, changing the                            patient to a prone position, using manual pressure,                            straightening and shortening the scope to obtain  bowel loop reduction and applying abdominal                            pressure. The patient tolerated the procedure                            fairly well. The quality of the bowel preparation                            was good BUT LIMITED EVALUATION OF MUCOSA IN RIGHT                            COLON. POLYPS < 5 MM WOULD BE MISSED. Scope In: 10:57:01 AM Scope Out: 11:31:04 AM Scope Withdrawal Time: 0 hours 15 minutes 48 seconds  Total Procedure Duration: 0 hours 34 minutes 3 seconds  Findings:      The recto-sigmoid colon, sigmoid colon and descending colon were       significantly redundant.      A 8 mm polyp was found in the distal transverse colon. The polyp was       sessile. The polyp was removed with a hot snare. Resection and retrieval       were complete.      Many small and large-mouthed diverticula were found in the sigmoid       colon, descending colon and transverse colon.      Non-bleeding internal  hemorrhoids were found. The hemorrhoids were       moderate. Impression:               - Redundant colon.                           - One 8 mm polyp in the distal transverse colon,                            removed with a hot snare. Resected and retrieved.                           - Diverticulosis in the sigmoid colon, in the                            descending colon and in the transverse colon.                           - Non-bleeding internal hemorrhoids. Moderate Sedation:      Moderate (conscious) sedation was administered by the endoscopy nurse       and supervised by the endoscopist. The following parameters were       monitored: oxygen saturation, heart rate, blood pressure, and response       to care. Total physician intraservice time was 48 minutes. Recommendation:           - High fiber diet.                           - Continue present medications.                           -  Await pathology results.                           - Repeat colonoscopy 1-3 YEARS for surveillance DUE                            TO FAIR PREP IN RIGHT COLON. NEEDS COLOWRAP XL.                           - Patient has a contact number available for                            emergencies. The signs and symptoms of potential                            delayed complications were discussed with the                            patient. Return to normal activities tomorrow.                            Written discharge instructions were provided to the                            patient. Procedure Code(s):        --- Professional ---                           [phone], Colonoscopy, flexible; with removal of                            tumor(s), polyp(s), or other lesion(s) by snare                            technique                           99152, Moderate sedation services provided by the                            same physician or other qualified health care                            professional  performing the diagnostic or                            therapeutic service that the sedation supports,                            requiring the presence of an independent trained                            observer to assist in the monitoring of the  patient's level of consciousness and physiological                            status; initial 15 minutes of intraservice time,                            patient age 3 years or older                           [phone], Moderate sedation services; each additional                            15 minutes intraservice time                           [phone], Moderate sedation services; each additional                            15 minutes intraservice time Diagnosis Code(s):        --- Professional ---                           Z86.010, Personal history of colonic polyps                           D12.3, Benign neoplasm of transverse colon (hepatic                            flexure or splenic flexure)                           K64.8, Other hemorrhoids                           K57.30, Diverticulosis of large intestine without                            perforation or abscess without bleeding                           Q43.8, Other specified congenital malformations of                            intestine CPT copyright 2016 [employer]. All rights reserved. The codes documented in this report are preliminary and upon coder review may  be revised to meet current compliance requirements. Barney Drain, MD Barney Drain, MD 08/29/2016 11:53:30 AM This report has been signed electronically. Number of Addenda: 0

## 2016-09-03 ENCOUNTER — Encounter (HOSPITAL_COMMUNITY): Payer: Self-pay | Admitting: Gastroenterology

## 2016-09-13 ENCOUNTER — Telehealth: Payer: Self-pay | Admitting: Gastroenterology

## 2016-09-18 NOTE — Telephone Encounter (Signed)
Please call pt. He had A simple adenoma removed from hIS colon. NEXT TCS BEFORE END OF 2017 TO SAVE ON YOUR DEDUCTIBLE OR WITHIN THE NEXT YEAR. FOLLOW A High fiber diet.

## 2016-09-18 NOTE — Telephone Encounter (Signed)
Pt is calling to see about his results from the TCS. Please advise

## 2016-09-18 NOTE — Telephone Encounter (Signed)
ON RECALL  °

## 2016-09-18 NOTE — Telephone Encounter (Signed)
Pt is aware and he would he like to wait until a year. NIC for TCS in one year

## 2016-09-23 DIAGNOSIS — K5790 Diverticulosis of intestine, part unspecified, without perforation or abscess without bleeding: Secondary | ICD-10-CM | POA: Diagnosis not present

## 2016-09-23 DIAGNOSIS — L720 Epidermal cyst: Secondary | ICD-10-CM | POA: Diagnosis not present

## 2016-09-23 DIAGNOSIS — Z86010 Personal history of colonic polyps: Secondary | ICD-10-CM | POA: Diagnosis not present

## 2016-09-23 DIAGNOSIS — Z6828 Body mass index (BMI) 28.0-28.9, adult: Secondary | ICD-10-CM | POA: Diagnosis not present

## 2016-12-05 DIAGNOSIS — E782 Mixed hyperlipidemia: Secondary | ICD-10-CM | POA: Diagnosis not present

## 2016-12-05 DIAGNOSIS — R7301 Impaired fasting glucose: Secondary | ICD-10-CM | POA: Diagnosis not present

## 2016-12-10 DIAGNOSIS — I10 Essential (primary) hypertension: Secondary | ICD-10-CM | POA: Diagnosis not present

## 2016-12-10 DIAGNOSIS — Z6828 Body mass index (BMI) 28.0-28.9, adult: Secondary | ICD-10-CM | POA: Diagnosis not present

## 2016-12-10 DIAGNOSIS — E782 Mixed hyperlipidemia: Secondary | ICD-10-CM | POA: Diagnosis not present

## 2016-12-10 DIAGNOSIS — Z86010 Personal history of colonic polyps: Secondary | ICD-10-CM | POA: Diagnosis not present

## 2016-12-10 DIAGNOSIS — D45 Polycythemia vera: Secondary | ICD-10-CM | POA: Diagnosis not present

## 2016-12-10 DIAGNOSIS — R7301 Impaired fasting glucose: Secondary | ICD-10-CM | POA: Diagnosis not present

## 2017-02-10 DIAGNOSIS — Z6828 Body mass index (BMI) 28.0-28.9, adult: Secondary | ICD-10-CM | POA: Diagnosis not present

## 2017-02-10 DIAGNOSIS — D751 Secondary polycythemia: Secondary | ICD-10-CM | POA: Diagnosis not present

## 2017-02-10 DIAGNOSIS — R011 Cardiac murmur, unspecified: Secondary | ICD-10-CM | POA: Diagnosis not present

## 2017-02-26 ENCOUNTER — Encounter: Payer: Self-pay | Admitting: Cardiology

## 2017-02-26 NOTE — Progress Notes (Signed)
Cardiology Office Note  Date: 02/27/2017   ID: [name], DOB [birth year], MRN [id number]  PCP: Wende Neighbors, MD  Consulting Cardiologist: [name], MD   Chief Complaint  Patient presents with  . Heart Murmur    History of Present Illness: Lawrence [name] is a 66 y.o. male referred for cardiology consultation by Dr. Nevada Crane for further evaluation of heart murmur.He states that he has been aware of having a heart murmur for at least the last 10 years, was originally diagnosed when he lived in [place] and had an echocardiogram done at that time. He does not recall the findings of this study or whether he had documented valvular heart disease or not. From a functional perspective, he is not reporting any limiting shortness of breath or chest pain. He states that he is disabled due to chronic back pain which is his main limitation. He does not report any palpitations or syncope.  He saw Dr. [place] Crane recently due to polycythemia on screening lab work, hemoglobin 18.2. He was placed on aspirin with further workup pending. Current medications are reviewed below. He reports compliance.  I personally reviewed his ECG today which shows sinus rhythm with left anterior fascicular block.  Past Medical History:  Diagnosis Date  . Arthritis   . Essential hypertension   . GERD (gastroesophageal reflux disease)   . Gout     Past Surgical History:  Procedure Laterality Date  . COLONOSCOPY N/A 08/29/2016   Procedure: COLONOSCOPY;  Surgeon: Danie Binder, MD;  Location: [facility];  Service: Endoscopy;  Laterality: N/A;  1030  . HERNIA REPAIR      Current Outpatient Prescriptions  Medication Sig Dispense Refill  . allopurinol (ZYLOPRIM) 300 MG tablet Take 300 mg by mouth daily.     . cyclobenzaprine (FLEXERIL) 5 MG tablet Take 5 mg by mouth at bedtime as needed for muscle spasms.     Marland Kitchen ibuprofen (ADVIL,MOTRIN) 600 MG tablet Take 600 mg by mouth every 6 (six) hours as needed for  pain.    Marland Kitchen lisinopril (PRINIVIL,ZESTRIL) 20 MG tablet Take 20 mg by mouth daily.     No current facility-administered medications for this visit.    Allergies:  Morphine and related   Social History: The patient  reports that he quit smoking about 25 years ago. His smoking use included Cigarettes. He has a 62.50 pack-year smoking history. He has never used smokeless tobacco. He reports that he drinks alcohol. He reports that he does not use drugs.   Family History: The patient's family history includes Colon polyps in his sister; Heart attack in his mother; Stomach cancer in his father.   ROS:  Please see the history of present illness. Otherwise, complete review of systems is positive for snoring and periods of apnea during sleep - recalls being told this by his late wife.  All other systems are reviewed and negative.   Physical Exam: VS:  BP (!) 138/96 (BP Location: Right Arm)   Pulse 99   Ht 5\' 11"  (1.803 m)   Wt 212 lb (96.2 kg)   SpO2 94%   BMI 29.57 kg/m , BMI Body mass index is 29.57 kg/m.  Wt Readings from Last 3 Encounters:  02/27/17 212 lb (96.2 kg)  08/29/16 209 lb (94.8 kg)  08/01/16 208 lb 12.8 oz (94.7 kg)    General: Patient appears comfortable at rest. HEENT: Conjunctiva and lids normal, oropharynx clear. Neck: Supple, no elevated JVP or carotid bruits, no  thyromegaly. Lungs: Clear to auscultation, nonlabored breathing at rest. Cardiac: Regular rate and rhythm, S4, 3-6/0 systolic murmur best heard at the right base, no pericardial rub. Abdomen: Soft, nontender, bowel sounds present, no guarding or rebound. Extremities: No pitting edema, distal pulses 2+. Skin: Warm and dry. Musculoskeletal: No kyphosis. Neuropsychiatric: Alert and oriented x3, affect grossly appropriate.  ECG: No old tracing available for comparison.  Assessment and Plan:  1. Systolic heart murmur, predominantly aortic position. Patient reports being aware of this for at least the last 10  years. He had an echocardiogram in the past but does not recall the results. ECG reviewed and overall nonspecific. He does have elevated blood pressure and polycythemia, so his murmur could be accentuated by flow. Plan will be to obtain an echocardiogram for further objective assessment.  2. Essential hypertension, blood pressure is elevated today. He reports compliance with lisinopril.  3. Patient states that he was told by his late wife that he snored and also had periods of apnea at nighttime. I asked him to discuss this with Dr. Nevada Crane regarding possibility of a sleep test to evaluate for obstructive sleep apnea.  4. Tobacco abuse in remission.  Current medicines were reviewed with the patient today.   Orders Placed This Encounter  Procedures  . EKG 12-Lead  . ECHOCARDIOGRAM COMPLETE    Disposition: Call with test results and determine follow-up.  Signed, Satira Sark, MD, [facility] 02/27/2017 1:49 PM    [place] [facility] [address], [place], [place] [address] Phone: [phone]; Fax: [phone]

## 2017-02-27 ENCOUNTER — Ambulatory Visit (INDEPENDENT_AMBULATORY_CARE_PROVIDER_SITE_OTHER): Payer: Medicare HMO | Admitting: Cardiology

## 2017-02-27 ENCOUNTER — Encounter: Payer: Self-pay | Admitting: Cardiology

## 2017-02-27 VITALS — BP 138/96 | HR 99 | Ht 71.0 in | Wt 212.0 lb

## 2017-02-27 DIAGNOSIS — Z9189 Other specified personal risk factors, not elsewhere classified: Secondary | ICD-10-CM

## 2017-02-27 DIAGNOSIS — F17201 Nicotine dependence, unspecified, in remission: Secondary | ICD-10-CM

## 2017-02-27 DIAGNOSIS — I10 Essential (primary) hypertension: Secondary | ICD-10-CM | POA: Diagnosis not present

## 2017-02-27 DIAGNOSIS — R011 Cardiac murmur, unspecified: Secondary | ICD-10-CM

## 2017-02-27 NOTE — Patient Instructions (Signed)
Your physician recommends that you schedule a follow-up appointment depending on test results   Your physician recommends that you continue on your current medications as directed. Please refer to the Current Medication list given to you today.  Your physician has requested that you have an echocardiogram. Echocardiography is a painless test that uses sound waves to create images of your heart. It provides your doctor with information about the size and shape of your heart and how well your heart's chambers and valves are working. This procedure takes approximately one hour. There are no restrictions for this procedure.   If you need a refill on your cardiac medications before your next appointment, please call your pharmacy.  Thank you for choosing [facility]!

## 2017-03-13 ENCOUNTER — Ambulatory Visit (HOSPITAL_COMMUNITY)
Admission: RE | Admit: 2017-03-13 | Discharge: 2017-03-13 | Disposition: A | Discharge status: Home / Self Care | Payer: Medicare HMO | Source: Ambulatory Visit | Attending: Cardiology | Admitting: Cardiology

## 2017-03-13 DIAGNOSIS — I517 Cardiomegaly: Secondary | ICD-10-CM | POA: Diagnosis not present

## 2017-03-13 DIAGNOSIS — R011 Cardiac murmur, unspecified: Secondary | ICD-10-CM | POA: Diagnosis not present

## 2017-03-13 DIAGNOSIS — I5030 Unspecified diastolic (congestive) heart failure: Secondary | ICD-10-CM | POA: Insufficient documentation

## 2017-03-13 DIAGNOSIS — I77810 Thoracic aortic ectasia: Secondary | ICD-10-CM | POA: Insufficient documentation

## 2017-03-13 DIAGNOSIS — I352 Nonrheumatic aortic (valve) stenosis with insufficiency: Secondary | ICD-10-CM | POA: Insufficient documentation

## 2017-03-13 DIAGNOSIS — I361 Nonrheumatic tricuspid (valve) insufficiency: Secondary | ICD-10-CM | POA: Diagnosis not present

## 2017-03-13 DIAGNOSIS — D509 Iron deficiency anemia, unspecified: Secondary | ICD-10-CM | POA: Diagnosis not present

## 2017-03-13 DIAGNOSIS — I350 Nonrheumatic aortic (valve) stenosis: Secondary | ICD-10-CM

## 2017-03-13 LAB — ECHOCARDIOGRAM COMPLETE
AOPV: 0.67 m/s
AV Area VTI index: 0.83 cm2/m2
AV Area mean vel: 1.87 cm2
AV area mean vel ind: 0.85 cm2/m2
AV peak Index: 0.87
AV pk vel: 215 cm/s
AV vel: 1.83
AVA: 1.83 cm2
AVAREAVTI: 1.92 cm2
AVCELMEANRAT: 0.66
AVG: 10 mmHg
AVPG: 18 mmHg
CHL CUP DOP CALC LVOT VTI: 33.9 cm
CHL CUP STROKE VOLUME: 63 mL
CHL CUP TV REG PEAK VELOCITY: 213 cm/s
DOP CAL AO MEAN VELOCITY: 141 cm/s
EERAT: 8.05
EWDT: 239 ms
FS: 29 % (ref 28–44)
IVS/LV PW RATIO, ED: 0.9
LA ID, A-P, ES: 28 mm
LA diam end sys: 28 mm
LA vol A4C: 40.5 ml
LADIAMINDEX: 1.26 cm/m2
LAVOL: 45.3 mL
LAVOLIN: 20.4 mL/m2
LDCA: 2.84 cm2
LV E/e' medial: 8.05
LV PW d: 14.4 mm — AB (ref 0.6–1.1)
LV TDI E'LATERAL: 7.18
LV TDI E'MEDIAL: 6.2
LV e' LATERAL: 7.18 cm/s
LV sys vol index: 20 mL/m2
LV sys vol: 44 mL
LVDIAVOL: 106 mL (ref 62–150)
LVDIAVOLIN: 48 mL/m2
LVEEAVG: 8.05
LVOT SV: 96 mL
LVOT peak VTI: 0.64 cm
LVOT peak grad rest: 8 mmHg
LVOT peak vel: 145 cm/s
LVOTD: 19 mm
Lateral S' vel: 12.3 cm/s
MV Dec: 239
MV pk A vel: 73.8 m/s
MVPKEVEL: 57.8 m/s
P 1/2 time: 751 ms
Simpson's disk: 59
TAPSE: 18.7 mm
TRMAXVEL: 213 cm/s
VTI: 52.7 cm
Valve area index: 0.83

## 2017-03-13 NOTE — Progress Notes (Signed)
*  PRELIMINARY RESULTS* Echocardiogram 2D Echocardiogram has been performed.  Lawrence [name] 03/13/2017, 9:36 AM

## 2017-08-04 ENCOUNTER — Other Ambulatory Visit: Payer: Self-pay

## 2017-08-04 ENCOUNTER — Emergency Department (HOSPITAL_COMMUNITY)
Admission: EM | Admit: 2017-08-04 | Discharge: 2017-08-04 | Disposition: A | Discharge status: Home / Self Care | Payer: Medicare HMO | Attending: Emergency Medicine | Admitting: Emergency Medicine

## 2017-08-04 ENCOUNTER — Emergency Department (HOSPITAL_COMMUNITY): Payer: Medicare HMO

## 2017-08-04 ENCOUNTER — Encounter (HOSPITAL_COMMUNITY): Payer: Self-pay | Admitting: Emergency Medicine

## 2017-08-04 DIAGNOSIS — Z79899 Other long term (current) drug therapy: Secondary | ICD-10-CM | POA: Diagnosis not present

## 2017-08-04 DIAGNOSIS — Z87891 Personal history of nicotine dependence: Secondary | ICD-10-CM | POA: Diagnosis not present

## 2017-08-04 DIAGNOSIS — R079 Chest pain, unspecified: Secondary | ICD-10-CM | POA: Diagnosis not present

## 2017-08-04 DIAGNOSIS — R0789 Other chest pain: Secondary | ICD-10-CM | POA: Insufficient documentation

## 2017-08-04 DIAGNOSIS — K5730 Diverticulosis of large intestine without perforation or abscess without bleeding: Secondary | ICD-10-CM | POA: Diagnosis not present

## 2017-08-04 DIAGNOSIS — I10 Essential (primary) hypertension: Secondary | ICD-10-CM | POA: Diagnosis not present

## 2017-08-04 LAB — BASIC METABOLIC PANEL
Anion gap: 10 (ref 5–15)
BUN: 17 mg/dL (ref 6–20)
CHLORIDE: 103 mmol/L (ref 101–111)
CO2: 29 mmol/L (ref 22–32)
CREATININE: 1.06 mg/dL (ref 0.61–1.24)
Calcium: 9.9 mg/dL (ref 8.9–10.3)
GFR calc Af Amer: >60 mL/min (ref >60)
GFR calc non Af Amer: >60 mL/min (ref >60)
Glucose, Bld: 118 mg/dL — ABNORMAL HIGH (ref 65–99)
POTASSIUM: 4 mmol/L (ref 3.5–5.1)
Sodium: 142 mmol/L (ref 135–145)

## 2017-08-04 LAB — HEPATIC FUNCTION PANEL
ALT: 31 U/L (ref 17–63)
AST: 27 U/L (ref 15–41)
Albumin: 4.4 g/dL (ref 3.5–5.0)
Alkaline Phosphatase: 72 U/L (ref 38–126)
BILIRUBIN DIRECT: 0.1 mg/dL (ref 0.1–0.5)
BILIRUBIN INDIRECT: 0.6 mg/dL (ref 0.3–0.9)
Total Bilirubin: 0.7 mg/dL (ref 0.3–1.2)
Total Protein: 7.3 g/dL (ref 6.5–8.1)

## 2017-08-04 LAB — CBC
HEMATOCRIT: 46.9 % (ref 39.0–52.0)
Hemoglobin: 16 g/dL (ref 13.0–17.0)
MCH: 32 pg (ref 26.0–34.0)
MCHC: 34.1 g/dL (ref 30.0–36.0)
MCV: 93.8 fL (ref 78.0–100.0)
PLATELETS: 172 10*3/uL (ref 150–400)
RBC: 5 MIL/uL (ref 4.22–5.81)
RDW: 12.9 % (ref 11.5–15.5)
WBC: 7 10*3/uL (ref 4.0–10.5)

## 2017-08-04 LAB — TROPONIN I: Troponin I: <0.03 ng/mL (ref <0.03)

## 2017-08-04 MED ORDER — NITROGLYCERIN 0.4 MG SL SUBL
0.4000 mg | SUBLINGUAL_TABLET | SUBLINGUAL | Status: AC | PRN
  Administered 2017-08-04 (×3): 0.4 mg via SUBLINGUAL
  Filled 2017-08-04: qty 1

## 2017-08-04 MED ORDER — ONDANSETRON HCL 4 MG/2ML IJ SOLN
4.0000 mg | Freq: Once | INTRAMUSCULAR | Status: AC
  Administered 2017-08-04: 4 mg via INTRAVENOUS
  Filled 2017-08-04: qty 2

## 2017-08-04 MED ORDER — TRAMADOL HCL 50 MG PO TABS
ORAL_TABLET | ORAL | Status: AC
  Filled 2017-08-04: qty 1

## 2017-08-04 MED ORDER — TRAMADOL HCL 50 MG PO TABS
50.0000 mg | ORAL_TABLET | Freq: Once | ORAL | Status: AC
  Administered 2017-08-04: 50 mg via ORAL

## 2017-08-04 MED ORDER — IOPAMIDOL (ISOVUE-370) INJECTION 76%
100.0000 mL | Freq: Once | INTRAVENOUS | Status: AC | PRN
  Administered 2017-08-04: 100 mL via INTRAVENOUS

## 2017-08-04 MED ORDER — TRAMADOL HCL 50 MG PO TABS: 50.0000 mg | ORAL_TABLET | Freq: Four times a day (QID) | ORAL | 0 refills | Status: DC | PRN

## 2017-08-04 MED ORDER — HYDROMORPHONE HCL 1 MG/ML IJ SOLN
1.0000 mg | Freq: Once | INTRAMUSCULAR | Status: AC
Start: 2017-08-04 — End: 2017-08-04
  Administered 2017-08-04: 1 mg via INTRAVENOUS
  Filled 2017-08-04: qty 1

## 2017-08-04 NOTE — ED Notes (Signed)
ED Provider at bedside. 

## 2017-08-04 NOTE — ED Provider Notes (Signed)
[facility] DEPT Provider Note   CSN: [id number] Arrival date & time: 08/04/17  1608     History   Chief Complaint Chief Complaint  Patient presents with  . Chest Pain    HPI Lawrence [name] is a 66 y.o. male.  Patient complains of left-sided chest pain. He states is severe and sharp   The history is provided by the patient.  Chest Pain   This is a new problem. The current episode started 12 to 24 hours ago. The problem occurs constantly. The problem has not changed since onset.The pain is associated with movement. The pain is present in the substernal region. The pain is at a severity of 4/10. The pain is severe. The quality of the pain is described as stabbing. The pain does not radiate. Pertinent negatives include no abdominal pain, no back pain, no cough and no headaches.  Pertinent negatives for past medical history include no seizures.    Past Medical History:  Diagnosis Date  . Arthritis   . Essential hypertension   . GERD (gastroesophageal reflux disease)   . Gout     Patient Active Problem List   Diagnosis Date Noted  . History of colonic polyps 08/01/2016    Past Surgical History:  Procedure Laterality Date  . COLONOSCOPY N/A 08/29/2016   Procedure: COLONOSCOPY;  Surgeon: Danie Binder, MD;  Location: [facility];  Service: Endoscopy;  Laterality: N/A;  1030  . HERNIA REPAIR         Home Medications    Prior to Admission medications   Medication Sig Start Date End Date Taking? Authorizing Provider  allopurinol (ZYLOPRIM) 300 MG tablet Take 300 mg by mouth daily.    Yes [provider]  cyclobenzaprine (FLEXERIL) 5 MG tablet Take 5 mg by mouth at bedtime as needed for muscle spasms.    Yes [provider]  ibuprofen (ADVIL,MOTRIN) 600 MG tablet Take 600 mg by mouth every 6 (six) hours as needed for pain.   Yes [provider]  lisinopril (PRINIVIL,ZESTRIL) 20 MG tablet Take 20 mg by mouth daily.   Yes [provider]    Family History Family History  Problem Relation Age of Onset  . Colon polyps Sister   . Heart attack Mother   . Stomach cancer Father   . Colon cancer Neg Hx     Social History Social History  Substance Use Topics  . Smoking status: Former Smoker    Packs/day: 2.50    Years: 25.00    Types: Cigarettes    Quit date: 08/02/1991  . Smokeless tobacco: Never Used  . Alcohol use Yes     Comment: Occasionally, with sporting events up to 4 at a time     Allergies   Morphine and related   Review of Systems Review of Systems  Constitutional: Negative for appetite change and fatigue.  HENT: Negative for congestion, ear discharge and sinus pressure.   Eyes: Negative for discharge.  Respiratory: Negative for cough.   Cardiovascular: Positive for chest pain.  Gastrointestinal: Negative for abdominal pain and diarrhea.  Genitourinary: Negative for frequency and hematuria.  Musculoskeletal: Negative for back pain.  Skin: Negative for rash.  Neurological: Negative for seizures and headaches.  Psychiatric/Behavioral: Negative for hallucinations.     Physical Exam Updated Vital Signs BP (!) 151/100   Pulse 73   Temp 98 F (36.7 C) (Oral)   Resp 17   Ht 5' 10.5" (1.791 m)   Wt 95.3  kg (210 lb)   SpO2 94%   BMI 29.71 kg/m   Physical Exam  Constitutional: He is oriented to person, place, and time. He appears well-developed.  HENT:  Head: Normocephalic.  Eyes: Conjunctivae and EOM are normal. No scleral icterus.  Neck: Neck supple. No thyromegaly present.  Cardiovascular: Normal rate and regular rhythm.  Exam reveals no gallop and no friction rub.   No murmur heard. Pulmonary/Chest: No stridor. He has no wheezes. He has no rales. He exhibits tenderness.  Patient has tenderness to left anterior chest.  Abdominal: He exhibits no distension. There is no tenderness. There is no rebound.  Musculoskeletal: Normal range of motion. He exhibits no edema.    Lymphadenopathy:    He has no cervical adenopathy.  Neurological: He is oriented to person, place, and time. He exhibits normal muscle tone. Coordination normal.  Skin: No rash noted. No erythema.  Psychiatric: He has a normal mood and affect. His behavior is normal.     ED Treatments / Results  Labs (all labs ordered are listed, but only abnormal results are displayed) Labs Reviewed  BASIC METABOLIC PANEL - Abnormal; Notable for the following:       Result Value   Glucose, Bld 118 (*)    All other components within normal limits  CBC  TROPONIN I  HEPATIC FUNCTION PANEL  TROPONIN I    EKG  EKG Interpretation None       Radiology Dg Chest 2 View  Result Date: 08/04/2017 CLINICAL DATA:  Left-sided chest pain EXAM: CHEST  2 VIEW COMPARISON:  None. FINDINGS: Low volume film. The lungs are clear without focal pneumonia, edema, pneumothorax or pleural effusion. Prominence of the right mediastinal contour likely related to thoracic aortic aneurysm although slight rightward rotation may account for this finding. The visualized bony structures of the thorax are intact. IMPRESSION: Prominent right mediastinal contour may be related to ascending thoracic aortic aneurysm although patient is rotated to the right on the frontal projection which would accentuate the right mediastinal contours. Chest CT could be used to further evaluate as clinically warranted. Electronically Signed   By: Misty Stanley M.D.   On: 08/04/2017 16:34   Ct Angio Chest/abd/pel For Dissection W And/or Wo Contrast  Result Date: 08/04/2017 CLINICAL DATA:  Non radiating LEFT chest pain for 30 minutes. Flu symptoms. History of hypertension. EXAM: CT ANGIOGRAPHY CHEST, ABDOMEN AND PELVIS TECHNIQUE: Multidetector CT imaging through the chest, abdomen and pelvis was performed using the standard protocol during bolus administration of intravenous contrast. Multiplanar reconstructed images and MIPs were obtained and reviewed  to evaluate the vascular anatomy. CONTRAST:  100 cc Isovue 370 COMPARISON:  Chest radiograph August 04, 2017 at 1610 hours an CT abdomen and pelvis June 17, 2013 FINDINGS: CTA CHEST FINDINGS CARDIOVASCULAR: Ascending aorta is 5 cm in transaxial dimension. Mild calcific atherosclerosis. No intrinsic density on noncontrast CT. Homogeneous contrast opacification of thoracic aorta without dissection, aneurysm, luminal irregularity, periaortic fluid collections, or contrast extravasation. Heart size is normal. Mild coronary artery calcification. No pericardial effusion. No central pulmonary embolism. MEDIASTINUM/NODES: No mediastinal mass or lymphadenopathy by CT size criteria. Mildly patulous thoracic esophagus with air-fluid level associated with reflux. LUNGS/PLEURA: Tracheobronchial tree is patent, no pneumothorax. Mild centrilobular emphysema. Apical bullous changes. No pleural effusions, focal consolidations, pulmonary nodules or masses. Bibasilar bandlike densities. MUSCULOSKELETAL: Non-suspicious. Review of the MIP images confirms the above findings. CTA ABDOMEN AND PELVIS FINDINGS VASCULAR Aorta: Abdominal aorta is normal caliber. Mildly tortuous course. Mild calcific  atherosclerosis Homogeneous contrast opacification of aortoiliac vessels without dissection, aneurysm, luminal irregularity, periaortic fluid collections, or contrast extravasation. Celiac: Patent. SMA: Patent. Renals: Patent. IMA: Patent. Inflow: Negative. Veins: Negative, not tailored for evaluation. Review of the MIP images confirms the above findings. NON-VASCULAR HEPATOBILIARY: Liver and gallbladder are normal. PANCREAS: Normal. SPLEEN: Normal considering arterial phase. ADRENALS/URINARY TRACT: Kidneys are orthotopic, demonstrating symmetric enhancement. No nephrolithiasis, hydronephrosis or solid renal masses. The unopacified ureters are normal in course and caliber. Urinary bladder is well distended and unremarkable. Normal adrenal  glands. STOMACH/BOWEL: The stomach, small and large bowel are normal in course and caliber without inflammatory changes, sensitivity decreased without oral contrast. Mild colonic diverticulosis. Normal appendix. VASCULAR/LYMPHATIC: No lymphadenopathy by CT size criteria. REPRODUCTIVE: Mild prostatomegaly. OTHER: No intraperitoneal free fluid or free air. MUSCULOSKELETAL: Nonacute. Small fat containing LEFT greater than RIGHT inguinal hernias. Subcentimeter sclerotic presumed bone island LEFT ischial tuberosity. Broad thoracolumbar dextroscoliosis. Review of the MIP images confirms the above findings. IMPRESSION: CTA CHEST: 1. 5 cm fusiform aneurysm ascending aorta. Recommend semi-annual imaging followup by CTA or MRA and referral to cardiothoracic surgery if not already obtained. This recommendation follows 2010 ACCF/AHA/AATS/ACR/ASA/SCA/SCAI/SIR/STS/SVM Guidelines for the Diagnosis and Management of Patients With Thoracic Aortic Disease. Circulation. 2010; 121: R443-X540. 2. Mild emphysema.  Bibasilar atelectasis/scarring. CTA ABDOMEN AND PELVIS: 1. No acute vascular process or or acute intra-abdominal/pelvic disease. 2. Mild colonic diverticulosis. Aortic Atherosclerosis (ICD10-I70.0), Emphysema (ICD10-J43.9), Aortic aneurysm NOS (ICD10-I71.9). Electronically Signed   By: Elon Alas M.D.   On: 08/04/2017 17:34    Procedures Procedures (including critical care time)  Medications Ordered in ED Medications  HYDROmorphone (DILAUDID) injection 1 mg (1 mg Intravenous Given 08/04/17 1652)  ondansetron (ZOFRAN) injection 4 mg (4 mg Intravenous Given 08/04/17 1650)  nitroGLYCERIN (NITROSTAT) SL tablet 0.4 mg (0.4 mg Sublingual Given 08/04/17 1704)  iopamidol (ISOVUE-370) 76 % injection 100 mL (100 mLs Intravenous Contrast Given 08/04/17 1710)     Initial Impression / Assessment and Plan / ED Course  I have reviewed the triage vital signs and the nursing notes.  Pertinent labs & imaging results that  were available during my care of the patient were reviewed by me and considered in my medical decision making (see chart for details). Patient had 2 normal troponins his pain was relieved with pain medicine. Patient does have a dilated aorta to 5 cm in his thoracic area.  he will be referred to thoracic surgery.  Suspect chest pain is chest wall pain. Patient will follow-up with his PCP     Final Clinical Impressions(s) / ED Diagnoses   Final diagnoses:  Chest wall pain    New Prescriptions New Prescriptions   No medications on file     Milton Ferguson, MD 08/04/17 2034

## 2017-08-04 NOTE — ED Notes (Signed)
Oxygen sat 89% on RA, oxygen applied at 2 LPM via [place].

## 2017-08-04 NOTE — ED Triage Notes (Signed)
Patient c/o left chest pain, non-radiating. Patient pain started approx 30 minutes ago. Per patient shortness of breath. Per patient nausea, vomiting, and generalized weakness yesterday-stating "I think I had the flu." Patient states flu like symptoms x3-4 days. Denies any cardiac hx.

## 2017-08-04 NOTE — ED Notes (Addendum)
Patient to CT with writer.

## 2017-08-04 NOTE — Discharge Instructions (Signed)
Follow-up with your family doctor this week for recheck. Follow-up with Dr.Gerhardt in 2-4 weeks to discuss your thoracic aorta

## 2017-08-04 NOTE — ED Notes (Signed)
Oxygen 91% on 2LPM. Increased oxygen to 3 LPM. Sat now 93%.

## 2017-08-07 DIAGNOSIS — Z09 Encounter for follow-up examination after completed treatment for conditions other than malignant neoplasm: Secondary | ICD-10-CM | POA: Diagnosis not present

## 2017-08-07 DIAGNOSIS — R079 Chest pain, unspecified: Secondary | ICD-10-CM | POA: Diagnosis not present

## 2017-08-07 DIAGNOSIS — Z6828 Body mass index (BMI) 28.0-28.9, adult: Secondary | ICD-10-CM | POA: Diagnosis not present

## 2017-08-11 ENCOUNTER — Telehealth: Payer: Self-pay | Admitting: Cardiology

## 2017-08-11 DIAGNOSIS — Q2381 Bicuspid aortic valve: Secondary | ICD-10-CM

## 2017-08-11 DIAGNOSIS — I712 Thoracic aortic aneurysm, without rupture, unspecified: Secondary | ICD-10-CM

## 2017-08-11 DIAGNOSIS — Q231 Congenital insufficiency of aortic valve: Secondary | ICD-10-CM

## 2017-08-11 NOTE — Telephone Encounter (Signed)
I already forwarded you a staff message sent to me by Dr. Servando Snare recently regarding this patient's CT scan results and with a request to make a referral to see Dr. Servando Snare for consultation. Thoracic aortic size has increased to 5 cm at least based on comparison to the echocardiogram he had earlier this year. With a bicuspid aortic valve, this needs to be followed closely as it relates to timing of potential surgical intervention. Please put in referral to see Dr. Servando Snare soon to discuss further.

## 2017-08-11 NOTE — Telephone Encounter (Signed)
Pt would like to speak w/ someone about the CT he had on 08/04/17-- he's concerned about his dx and would like some information about Dr. Servando Snare

## 2017-08-11 NOTE — Telephone Encounter (Signed)
Patient has apt scheduled with Dr.gerhardt, will have friend go with him that has had heart surgery before.he also plans to bring a list of questions

## 2017-08-11 NOTE — Telephone Encounter (Signed)
Attempt to reach pt, got vm,[id number]

## 2017-08-11 NOTE — Telephone Encounter (Signed)
ED noted 5 cm thoracic aneurysm from 08/04/17 visit, I do not see where they referred him to anyone.Does he need to see you ? Should I place referral to [facility] in your name ?

## 2017-08-13 ENCOUNTER — Encounter: Payer: Self-pay | Admitting: Gastroenterology

## 2017-08-25 ENCOUNTER — Telehealth: Payer: Self-pay

## 2017-08-25 NOTE — Telephone Encounter (Signed)
Pt came by the office with his letter that it was time to schedule his next colonoscopy. Has a hx of polyps and last year simple adenoma. He is having to take care of an aortic aneurysm now and will likely be several weeks before he can do the colonoscopy. He wanted me to go over his previous results and I did. He said he will call when he is able to schedule the colonoscopy.

## 2017-08-26 NOTE — Telephone Encounter (Signed)
Pt is aware.  

## 2017-08-26 NOTE — Telephone Encounter (Signed)
PLEASE CALL PT. OK TO WAIT UNTIL HIS ANEURYSM IS REPAIRED. HE SHOULD CONTACT us TO SCHEDULE HIS COLONOSCOPY WITHIN THE NEXT 6-12 MOS.

## 2017-08-28 ENCOUNTER — Institutional Professional Consult (permissible substitution) (INDEPENDENT_AMBULATORY_CARE_PROVIDER_SITE_OTHER): Payer: Medicare HMO | Admitting: Cardiothoracic Surgery

## 2017-08-28 ENCOUNTER — Encounter: Payer: Self-pay | Admitting: Cardiothoracic Surgery

## 2017-08-28 ENCOUNTER — Telehealth: Payer: Self-pay | Admitting: *Deleted

## 2017-08-28 VITALS — BP 154/104 | HR 69 | Ht 70.5 in | Wt 212.0 lb

## 2017-08-28 DIAGNOSIS — I712 Thoracic aortic aneurysm, without rupture, unspecified: Secondary | ICD-10-CM

## 2017-08-28 DIAGNOSIS — Q231 Congenital insufficiency of aortic valve: Secondary | ICD-10-CM | POA: Diagnosis not present

## 2017-08-28 DIAGNOSIS — I10 Essential (primary) hypertension: Secondary | ICD-10-CM

## 2017-08-28 MED ORDER — METOPROLOL TARTRATE 25 MG PO TABS: 25.0000 mg | ORAL_TABLET | Freq: Two times a day (BID) | ORAL | 1 refills | Status: DC

## 2017-08-28 NOTE — Patient Instructions (Signed)

## 2017-08-28 NOTE — Progress Notes (Signed)
[address]       [place] [address]             [phone]                    [place] A [facility] [place] Medical Record #[id number] Date of Birth: 1951/02/17  Referring:Dr Domenic Polite Primary Care: [name], MD  Chief Complaint:    Chief Complaint  Patient presents with  . 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 male is seen in the office  today for evaluation of dilated ascending aorta. The patient notes that in the spring of 2018 he was noted to have a soft murmur. Echocardiogram done at that time showed a bicuspid aortic valve. 3-4 weeks ago the patient had some chest discomfort and was seen in the emergency room CTA of the chest was done that showed a dilated ascending aorta to 5.0 cm without evidence of dissection.  Patient has a history of hypertension but otherwise no significant cardiac history.    patient does have a family history of her brother who at age 93 died suddenly at home found outside of the shower no autopsy was done    Current Activity/ Functional Status:  Patient is independent with mobility/ambulation, transfers, ADL's, IADL's.  Patient is disabled from chronic back pain  Zubrod Score: At the time of surgery this patient's most appropriate activity status/level should be described as: [x]     0    Normal activity, no symptoms []     1    Restricted in physical strenuous activity but ambulatory, able to do out light work []     2    Ambulatory and capable of self care, unable to do work activities, up and about               >50 % of waking hours                              []     3    Only limited self care, in bed greater than 50% of waking hours []     4    Completely disabled, no self care, confined to bed or chair []     5    Moribund   Past Medical History:  Diagnosis Date  . Arthritis   . Essential hypertension   . GERD  (gastroesophageal reflux disease)   . Gout     Past Surgical History:  Procedure Laterality Date  . COLONOSCOPY N/A 08/29/2016   Procedure: COLONOSCOPY;  Surgeon: Danie Binder, MD;  Location: [facility];  Service: Endoscopy;  Laterality: N/A;  1030  . HERNIA REPAIR      Family History  Problem Relation Age of Onset  . Colon polyps Sister   . Heart attack Mother   . Stomach cancer Father   . Colon cancer Neg Hx   Patient has one brother who died suddenly at home approximate one year ago at age 45 no autopsy was performed so exact cause of death is unknown Patient's father died at age 12 of gastric cancer, mother died at age 79 of an myocardial infarction while living in a nursing home. He has 8 siblings 1 at age 72 died of bone marrow cancer, 1 age 54 died  of brain cancer in the brother who died one year ago at age 50 sudden death at home. Social History   Social History  . Marital status: Widowed    Spouse name: N/A  . Number of children: N/A  . Years of education: N/A   Occupational History  . Not on file.   Social History Main Topics  . Smoking status: Former Smoker    Packs/day: 2.50    Years: 25.00    Types: Cigarettes    Quit date: 08/02/1991  . Smokeless tobacco: Never Used  . Alcohol use Yes     Comment: Occasionally, with sporting events up to 4 at a time  . Drug use: No  . Sexual activity: Not on file   Other Topics Concern  . Not on file   Social History Narrative  . No narrative on file  patient's wife died of malignancy proximally one year ago he moved from [place] to [place]. Currently on disability for  chronic back disorder  History  Smoking Status  . Former Smoker  . Packs/day: 2.50  . Years: 25.00  . Types: Cigarettes  . Quit date: 08/02/1991  Smokeless Tobacco  . Never Used    History  Alcohol Use  . Yes    Comment: Occasionally, with sporting events up to 4 at a time     Allergies  Allergen Reactions  . Morphine  And Related Other (See Comments)    Caused patient to pass out.     Current Outpatient Prescriptions  Medication Sig Dispense Refill  . allopurinol (ZYLOPRIM) 300 MG tablet Take 300 mg by mouth daily.     . cyclobenzaprine (FLEXERIL) 5 MG tablet Take 5 mg by mouth at bedtime as needed for muscle spasms.     Marland Kitchen ibuprofen (ADVIL,MOTRIN) 600 MG tablet Take 600 mg by mouth every 6 (six) hours as needed for pain.    Marland Kitchen lisinopril (PRINIVIL,ZESTRIL) 20 MG tablet Take 20 mg by mouth daily.    . nitroGLYCERIN (NITROSTAT) 0.3 MG SL tablet PLACE 1 TABLET UNDER THE TONGUE FOR CHEST PAIN; MAY USE ANOTHER T...  (REFER TO PRESCRIPTION NOTES).  0  . traMADol (ULTRAM) 50 MG tablet Take 1 tablet (50 mg total) by mouth every 6 (six) hours as needed. 20 tablet 0  . metoprolol tartrate (LOPRESSOR) 25 MG tablet Take 1 tablet (25 mg total) by mouth 2 (two) times daily. 60 tablet 1   No current facility-administered medications for this visit.     Pertinent items are noted in HPI.   Review of Systems:     Cardiac Review of Systems: Y or N  Chest Pain [ y   ]  Resting SOB [ n  ] Exertional SOB  [ n ]  Orthopnea [  n]   Pedal Edema [  n ]    Palpitations [n  ] Syncope  [  n]   Presyncope [ n  ]  General Review of Systems: [Y] = yes [  ]=no Constitional: recent weight change [  ];  Wt loss over the last 3 months [   ] anorexia [  ]; fatigue [  ]; nausea [  ]; night sweats [  ]; fever [  ]; or chills [  ];          Dental: poor dentition[  All dentures  ]; Last Dentist visit:   Eye : blurred vision [  ]; diplopia [   ]; vision changes [  ];  Amaurosis fugax[  ]; Resp: cough [  ];  wheezing[  ];  hemoptysis[  ]; shortness of breath[  ]; paroxysmal nocturnal dyspnea[  ]; dyspnea on exertion[  ]; or orthopnea[  ];  GI:  gallstones[  ], vomiting[  ];  dysphagia[  ]; melena[  ];  hematochezia [  ]; heartburn[  ];   Hx of  Colonoscopy[  ]; GU: kidney stones [  ]; hematuria[  ];   dysuria [  ];  nocturia[  ];  history of      obstruction [  ]; urinary frequency [  ]             Skin: rash, swelling[  ];, hair loss[  ];  peripheral edema[  ];  or itching[  ]; Musculosketetal: myalgias[  ];  joint swelling[  ];  joint erythema[  ];  joint pain[  ];  back pain[  ];  Heme/Lymph: bruising[  ];  bleeding[  ];  anemia[  ];  Neuro: TIA[  ];  headaches[  ];  stroke[  ];  vertigo[  ];  seizures[  ];   paresthesias[  ];  difficulty walking[  ];  Psych:depression[  ]; anxiety[  ];  Endocrine: diabetes[  ];  thyroid dysfunction[  ];  Immunizations: Flu up to date [ n ]; Pneumococcal up to date Florencio.Farrier  ];  Other:  Physical Exam: BP (!) 175/111   Pulse 69   Ht 5' 10.5" (1.791 m)   Wt 212 lb (96.2 kg)   SpO2 97%   BMI 29.99 kg/m  Repeat bp  160/103 had not taken BP meds  This am   PHYSICAL EXAMINATION: General appearance: alert, cooperative, appears stated age and no distress Head: Normocephalic, without obvious abnormality, atraumatic Neck: no adenopathy, no carotid bruit, no JVD, supple, symmetrical, trachea midline and thyroid not enlarged, symmetric, no tenderness/mass/nodules Lymph nodes: Cervical, supraclavicular, and axillary nodes normal. Resp: clear to auscultation bilaterally Back: symmetric, no curvature. ROM normal. No CVA tenderness. Cardio: systolic murmur: early systolic 2/6, crescendo at lower left sternal border GI: soft, non-tender; bowel sounds normal; no masses,  no organomegaly Extremities: extremities normal, atraumatic, no cyanosis or edema and Homans sign is negative, no sign of DVT Neurologic: Grossly normal Patient has no stigmata of Marfan's  Diagnostic Studies & Laboratory data:     Recent Radiology Findings:   Dg Chest 2 View  Result Date: 08/04/2017 CLINICAL DATA:  Left-sided chest pain EXAM: CHEST  2 VIEW COMPARISON:  None. FINDINGS: Low volume film. The lungs are clear without focal pneumonia, edema, pneumothorax or pleural effusion. Prominence of the right mediastinal contour likely  related to thoracic aortic aneurysm although slight rightward rotation may account for this finding. The visualized bony structures of the thorax are intact. IMPRESSION: Prominent right mediastinal contour may be related to ascending thoracic aortic aneurysm although patient is rotated to the right on the frontal projection which would accentuate the right mediastinal contours. Chest CT could be used to further evaluate as clinically warranted. Electronically Signed   By: Misty Stanley M.D.   On: 08/04/2017 16:34   Ct Angio Chest/abd/pel For Dissection W And/or Wo Contrast  Result Date: 08/04/2017 CLINICAL DATA:  Non radiating LEFT chest pain for 30 minutes. Flu symptoms. History of hypertension. EXAM: CT ANGIOGRAPHY CHEST, ABDOMEN AND PELVIS TECHNIQUE: Multidetector CT imaging through the chest, abdomen and pelvis was performed using the standard protocol during bolus administration of intravenous contrast. Multiplanar reconstructed images and MIPs were  obtained and reviewed to evaluate the vascular anatomy. CONTRAST:  100 cc Isovue 370 COMPARISON:  Chest radiograph August 04, 2017 at 1610 hours an CT abdomen and pelvis June 17, 2013 FINDINGS: CTA CHEST FINDINGS CARDIOVASCULAR: Ascending aorta is 5 cm in transaxial dimension. Mild calcific atherosclerosis. No intrinsic density on noncontrast CT. Homogeneous contrast opacification of thoracic aorta without dissection, aneurysm, luminal irregularity, periaortic fluid collections, or contrast extravasation. Heart size is normal. Mild coronary artery calcification. No pericardial effusion. No central pulmonary embolism. MEDIASTINUM/NODES: No mediastinal mass or lymphadenopathy by CT size criteria. Mildly patulous thoracic esophagus with air-fluid level associated with reflux. LUNGS/PLEURA: Tracheobronchial tree is patent, no pneumothorax. Mild centrilobular emphysema. Apical bullous changes. No pleural effusions, focal consolidations, pulmonary nodules or  masses. Bibasilar bandlike densities. MUSCULOSKELETAL: Non-suspicious. Review of the MIP images confirms the above findings. CTA ABDOMEN AND PELVIS FINDINGS VASCULAR Aorta: Abdominal aorta is normal caliber. Mildly tortuous course. Mild calcific atherosclerosis Homogeneous contrast opacification of aortoiliac vessels without dissection, aneurysm, luminal irregularity, periaortic fluid collections, or contrast extravasation. Celiac: Patent. SMA: Patent. Renals: Patent. IMA: Patent. Inflow: Negative. Veins: Negative, not tailored for evaluation. Review of the MIP images confirms the above findings. NON-VASCULAR HEPATOBILIARY: Liver and gallbladder are normal. PANCREAS: Normal. SPLEEN: Normal considering arterial phase. ADRENALS/URINARY TRACT: Kidneys are orthotopic, demonstrating symmetric enhancement. No nephrolithiasis, hydronephrosis or solid renal masses. The unopacified ureters are normal in course and caliber. Urinary bladder is well distended and unremarkable. Normal adrenal glands. STOMACH/BOWEL: The stomach, small and large bowel are normal in course and caliber without inflammatory changes, sensitivity decreased without oral contrast. Mild colonic diverticulosis. Normal appendix. VASCULAR/LYMPHATIC: No lymphadenopathy by CT size criteria. REPRODUCTIVE: Mild prostatomegaly. OTHER: No intraperitoneal free fluid or free air. MUSCULOSKELETAL: Nonacute. Small fat containing LEFT greater than RIGHT inguinal hernias. Subcentimeter sclerotic presumed bone island LEFT ischial tuberosity. Broad thoracolumbar dextroscoliosis. Review of the MIP images confirms the above findings. IMPRESSION: CTA CHEST: 1. 5 cm fusiform aneurysm ascending aorta. Recommend semi-annual imaging followup by CTA or MRA and referral to cardiothoracic surgery if not already obtained. This recommendation follows 2010 ACCF/AHA/AATS/ACR/ASA/SCA/SCAI/SIR/STS/SVM Guidelines for the Diagnosis and Management of Patients With Thoracic Aortic Disease.  Circulation. 2010; 121: H417-E081. 2. Mild emphysema.  Bibasilar atelectasis/scarring. CTA ABDOMEN AND PELVIS: 1. No acute vascular process or or acute intra-abdominal/pelvic disease. 2. Mild colonic diverticulosis. Aortic Atherosclerosis (ICD10-I70.0), Emphysema (ICD10-J43.9), Aortic aneurysm NOS (ICD10-I71.9). Electronically Signed   By: Elon Alas M.D.   On: 08/04/2017 17:34     I have independently reviewed the above radiology studies  and reviewed the findings with the patient.  ECHO: *[address], [place] [address]                            [phone]  ------------------------------------------------------------------- Transthoracic Echocardiography  Patient:    Lawrence [name], Lawrence [name] MR #:       [id number] Study Date: 03/13/2017 Gender:     M Age:        37 Height:     180.3 cm Weight:     96.2 kg BSA:        2.22 m^2 Pt. Status: Room:   ATTENDING  [name], M.D.  ORDERING     [name], M.D.  REFERRING    [name], M.D.  PERFORMING   [facility], Forestine Na  SONOGRAPHER  Alvino Chapel, RCS  cc:  ------------------------------------------------------------------- LV EF: 60%  ------------------------------------------------------------------- Indications:      Murmur 785.2.  ------------------------------------------------------------------- History:   PMH:  GERD.  ------------------------------------------------------------------- Study Conclusions  - Left ventricle: The cavity size was normal. There was moderate   concentric hypertrophy. Systolic function was normal. The   estimated ejection fraction was 60%. Wall motion was normal;   there were no regional wall motion abnormalities. Doppler   parameters are consistent with abnormal left ventricular   relaxation (grade 1 diastolic dysfunction). - Aortic valve: Mildly calcified annulus. Bicuspid.  There was mild   stenosis. There was mild to moderate regurgitation. Peak velocity   (S): 215 cm/s. Mean gradient (S): 10 mm Hg. Valve area (VTI):   1.83 cm^2. Valve area (Vmax): 1.92 cm^2. Valve area (Vmean): 1.87   cm^2. - Aorta: Mild aortic root dilatation. Aortic root dimension: 40 mm   (ED).  ------------------------------------------------------------------- Study data:  No prior study was available for comparison.  Study status:  Routine.  Procedure:  The patient reported no pain pre or post test. Transthoracic echocardiography. Image quality was adequate.  Study completion:  There were no complications. Transthoracic echocardiography.  M-mode, complete 2D, spectral Doppler, and color Doppler.  Birthdate:  Patient birthdate: 05-16-1951.  Age:  Patient is 66 yr old.  Sex:  Gender: male. BMI: 29.6 kg/m^2.  Blood pressure:     170/90  Patient status: Inpatient.  Study date:  Study date: 03/13/2017. Study time: 08:52 AM.  Location:  [facility].  -------------------------------------------------------------------  ------------------------------------------------------------------- Left ventricle:  The cavity size was normal. There was moderate concentric hypertrophy. Systolic function was normal. The estimated ejection fraction was 60%. Wall motion was normal; there were no regional wall motion abnormalities. Doppler parameters are consistent with abnormal left ventricular relaxation (grade 1 diastolic dysfunction). There was no evidence of elevated ventricular filling pressure by Doppler parameters.  ------------------------------------------------------------------- Aortic valve:   Mildly calcified annulus. Bicuspid.  Doppler: There was mild stenosis.   There was mild to moderate regurgitation.    VTI ratio of LVOT to aortic valve: 0.64. Valve area (VTI): 1.83 cm^2. Indexed valve area (VTI): 0.83 cm^2/m^2. Peak velocity ratio of LVOT to aortic valve: 0.67. Valve  area (Vmax): 1.92 cm^2. Indexed valve area (Vmax): 0.87 cm^2/m^2. Mean velocity ratio of LVOT to aortic valve: 0.66. Valve area (Vmean): 1.87 cm^2. Indexed valve area (Vmean): 0.85 cm^2/m^2.    Mean gradient (S): 10 mm Hg. Peak gradient (S): 18 mm Hg.  ------------------------------------------------------------------- Aorta:  Mild aortic root dilatation.  ------------------------------------------------------------------- Mitral valve:   Normal thickness leaflets .  Doppler:  There was no significant regurgitation.  ------------------------------------------------------------------- Left atrium:  The atrium was normal in size.  ------------------------------------------------------------------- Atrial septum:  No defect or patent foramen ovale was identified.   ------------------------------------------------------------------- Right ventricle:  The cavity size was normal. Wall thickness was normal. Systolic function was normal.  ------------------------------------------------------------------- Pulmonic valve:    The valve appears to be grossly normal. Doppler:  There was no significant regurgitation.  ------------------------------------------------------------------- Tricuspid valve:   Structurally normal valve.   Leaflet separation was normal.  Doppler:  Transvalvular velocity was within the normal range. There was trivial regurgitation.  ------------------------------------------------------------------- Right atrium:  The atrium was normal in size.  ------------------------------------------------------------------- Pericardium:  There was no pericardial effusion.  ------------------------------------------------------------------- Systemic veins:  Not  visualized.  ------------------------------------------------------------------- Post procedure conclusions Ascending Aorta:  - Mild aortic root  dilatation.  ------------------------------------------------------------------- Measurements   Left ventricle                           Value          Reference  LV ID, ED, PLAX chordal                  47.8  mm       43 - 52  LV ID, ES, PLAX chordal                  33.8  mm       23 - 38  LV fx shortening, PLAX chordal           29    %        >=29  LV PW thickness, ED                      14.4  mm       ----------  IVS/LV PW ratio, ED                      0.9            <=1.3  Stroke volume, 2D                        96    ml       ----------  Stroke volume/bsa, 2D                    43    ml/m^2   ----------  LV ejection fraction, 1-p A4C            61    %        ----------  LV end-diastolic volume, 2-p             106   ml       ----------  LV end-systolic volume, 2-p              44    ml       ----------  LV ejection fraction, 2-p                59    %        ----------  Stroke volume, 2-p                       63    ml       ----------  LV end-diastolic volume/bsa, 2-p         48    ml/m^2   ----------  LV end-systolic volume/bsa, 2-p          20    ml/m^2   ----------  Stroke volume/bsa, 2-p                   28.2  ml/m^2   ----------  LV e&', lateral                           7.18  cm/s     ----------  LV E/e&', lateral                         8.05           ----------  LV e&', medial                            6.2   cm/s     ----------  LV E/e&', medial                          9.32           ----------  LV e&', average                           6.69  cm/s     ----------  LV E/e&', average                         8.64           ----------    Ventricular septum                       Value          Reference  IVS thickness, ED                        13    mm       ----------    LVOT                                     Value          Reference  LVOT ID, S                               19    mm       ----------  LVOT area                                2.84  cm^2      ----------  LVOT peak velocity, S                    145   cm/s     ----------  LVOT mean velocity, S                    93    cm/s     ----------  LVOT VTI, S                              33.9  cm       ----------  LVOT peak gradient, S                    8     mm Hg    ----------    Aortic valve                             Value          Reference  Aortic valve peak velocity, S            215   cm/s     ----------  Aortic valve mean velocity, S  141   cm/s     ----------  Aortic valve VTI, S                      52.7  cm       ----------  Aortic mean gradient, S                  10    mm Hg    ----------  Aortic peak gradient, S                  18    mm Hg    ----------  VTI ratio, LVOT/AV                       0.64           ----------  Aortic valve area, VTI                   1.83  cm^2     ----------  Aortic valve area/bsa, VTI               0.83  cm^2/m^2 ----------  Velocity ratio, peak, LVOT/AV            0.67           ----------  Aortic valve area, peak velocity         1.92  cm^2     ----------  Aortic valve area/bsa, peak              0.87  cm^2/m^2 ----------  velocity  Velocity ratio, mean, LVOT/AV            0.66           ----------  Aortic valve area, mean velocity         1.87  cm^2     ----------  Aortic valve area/bsa, mean              0.85  cm^2/m^2 ----------  velocity  Aortic regurg pressure half-time         751   ms       ----------    Aorta                                    Value          Reference  Aortic root ID, ED                       40    mm       ----------    Left atrium                              Value          Reference  LA ID, A-P, ES                           28    mm       ----------  LA ID/bsa, A-P                           1.26  cm/m^2   <=2.2  LA volume, S  45.3  ml       ----------  LA volume/bsa, S                         20.4  ml/m^2   ----------  LA volume, ES, 1-p A4C                   40.5  ml        ----------  LA volume/bsa, ES, 1-p A4C               18.3  ml/m^2   ----------  LA volume, ES, 1-p A2C                   48.1  ml       ----------  LA volume/bsa, ES, 1-p A2C               21.7  ml/m^2   ----------    Mitral valve                             Value          Reference  Mitral E-wave peak velocity              57.8  cm/s     ----------  Mitral A-wave peak velocity              73.8  cm/s     ----------  Mitral deceleration time         (H)     239   ms       150 - 230  Mitral E/A ratio, peak                   0.8            ----------    Tricuspid valve                          Value          Reference  Tricuspid regurg peak velocity           213   cm/s     ----------  Tricuspid peak RV-RA gradient            18    mm Hg    ----------    Right atrium                             Value          Reference  RA ID, S-I, ES, A4C              (H)     62.4  mm       34 - 49  RA area, ES, A4C                         18.7  cm^2     8.3 - 19.5  RA volume, ES, A/L                       45.4  ml       ----------  RA volume/bsa, ES, A/L                   20.5  ml/m^2   ----------  Right ventricle                          Value          Reference  RV ID, ED, PLAX                          30    mm       19 - 38  TAPSE                                    18.7  mm       ----------  RV s&', lateral, S                        12.3  cm/s     ----------  Legend: (L)  and  (H)  mark values outside specified reference range.  ------------------------------------------------------------------- Prepared and Electronically Authenticated by  Kate Sable, MD 2018-05-03T10:08:02   Recent Lab Findings: Lab Results  Component Value Date   WBC 7.0 08/04/2017   HGB 16.0 08/04/2017   HCT 46.9 08/04/2017   PLT 172 08/04/2017   GLUCOSE 118 (H) 08/04/2017   ALT 31 08/04/2017   AST 27 08/04/2017   NA 142 08/04/2017   K 4.0 08/04/2017   CL 103 08/04/2017   CREATININE 1.06  08/04/2017   BUN 17 08/04/2017   CO2 29 08/04/2017   Aortic Size Index=     5.0     /Body surface area is 2.19 meters squared. = 2.28  < 2.75 cm/m2      4% risk per year 2.75 to 4.25          8% risk per year > 4.25 cm/m2    20% risk per year     Assessment / Plan:   1/Patient with bicuspid aortic valve mild aortic stenosis and aortic insufficiency with 5 cm ascending aorta, and questionable risk factor of history of sudden death of a 6 year old brother.  2/significant hypertension in the office today when the patient did not take his blood pressure medication-, I discussed with Dr. Domenic Polite, we have added Lopressor 25 twice a day to his lisinopril 20 mg a day. With early follow-up next week of his blood pressure control. 3/ with the patient's history of bicuspid aortic valve dilated ascending aorta, and potential risk factor of close family member with sudden death at a young age, when he sees Dr. Domenic Polite next week will be to discuss proceeding with cardiac catheterization to evaluate potential for coronary artery disease. He will need cardiac catheterization prior to replacement of his ascending aorta.  Plan to see him patient back within the next month after his had cardiac catheterization done.  Patient was given information about ascending aortic aneurysms, aortic dissection and bicuspid aortic valves.  Patient was warned about not using Cipro and similar antibiotics. Recent studies have raised concern that fluoroquinolone antibiotics could be associated with an increased risk of aortic aneurysm Fluoroquinolones have non-antimicrobial properties that might jeopardise the integrity of the extracellular matrix of the vascular wall In a  propensity score matched cohort study in [place], there was a 66% increased rate of aortic aneurysm or dissection associated with oral fluoroquinolone use, compared with amoxicillin use, within a 60 day risk period from start of treatment  The [employer]  of Cardiology [facility]) and the [facility]) have issued a statement to clarify 2 previous guidelines from the [facility], [place], and collaborating societies addressing the risk of aortic dissection in patients with bicuspid aortic valves (BAV) and severe aortic enlargement. The 2 guidelines differ with regard to the recommended threshold of aortic root or ascending aortic dilatation that would justify surgical intervention in patients with bicuspid aortic valves. This new statement of clarification uses the ACC/AHA revised structure for delineating the Class of Recommendation and Level of Evidence to provide recommendation that replace those contained in Section 9.2.2.1 of the thoracic aortic disease guidelines and Section 5.1.3 of the valvular heart disease guideline. New recommendations in intervention in patients with BAV and dilatation of the aortic root (sinuses) or ascending aorta include:  . Operative intervention to repair or replace the aortic root (sinuses) or replace the ascending aorta is indicated in asymptomatic patients with BAV if the diameter of the aortic root or ascending aorta is 5.5 cm or greater. (Class of recommendation 1, Level of evidence B-NR).  Marland Kitchen Operative intervention to repair or replace the aortic root (sinuses) or replace the ascending aorta is reasonable in asymptomatic patients with BAV if the diameter of the aortic root or ascending aorta is 5.0 cm or greater and an additional risk factor for dissection is present or if the patient is at low surgical risk and the surgery is performed by an experienced aortic surgical team in a center with established expertise in these procedures. (Class of recommendation IIa; Level of Evidence B-NR).  . Replacement of the ascending aorta is reasonable in patients with BAV undergoing AVR because of severe aortic stenosis or aortic regurgitation when the diameter of the ascending aorta is greater than 4.5 cm (Class of  recommendation IIa; Level of evidence C-EO).  Citation: Donnamarie Poag, Randolm Idol, et al. Surgery for aortic dilatation in patients with bicuspid aortic valves. A statement of clarification from the [employer] of Cardiology/American Heart Association Task Force on Clinical Practice Guidelines. [Published online ahead of print October 14, 2014]. Circulation. doi: 10.1161/CIR.0000000000000331. I  spent 40 minutes counseling the patient face to face and 50% or more the  time was spent in counseling and coordination of care. The total time spent in the appointment was 60 minutes.  [name] MD      [address] [place] [address] Office [phone]   Beeper [phone]  08/28/2017 11:18 AM

## 2017-08-28 NOTE — Telephone Encounter (Signed)
I TALKED WITH THEIR OFFICE AND REQUESTED THEY CONTACT MR. Lawrence [name] WITH AN APPOINTMENT FOR NEXT WEEK. DR. MCDOWELL AND DR. Servando Snare HAD A TELEPHONE CONFERENCE WHILE MR. Lawrence [name] WAS HERE TODAY AS A NEW REFERRAL FROM DR. [place] TAA. HIS BP WAS ELEVATED. HE NEEDS TO BE SEEN NEXT WEEK FOR BP EVAL AFTER METOPROLOL WAS STARTED BY DR. Servando Snare AND DISCUSS CARDIAC CATH IN 1-2 MONTHS.

## 2017-08-29 ENCOUNTER — Telehealth: Payer: Self-pay | Admitting: Gastroenterology

## 2017-08-29 NOTE — Telephone Encounter (Signed)
Pt needs to schedule his colonoscopy see previous phone notes and office note. Routing message

## 2017-08-29 NOTE — Telephone Encounter (Signed)
[phone]  PATIENT CALLED TO SCHEDULE HIS ONE YEAR TCS, CAN THIS BE TRIAGED?

## 2017-09-01 ENCOUNTER — Encounter: Payer: Self-pay | Admitting: Physician Assistant

## 2017-09-01 DIAGNOSIS — I10 Essential (primary) hypertension: Secondary | ICD-10-CM | POA: Insufficient documentation

## 2017-09-01 DIAGNOSIS — I700 Atherosclerosis of aorta: Secondary | ICD-10-CM | POA: Insufficient documentation

## 2017-09-01 DIAGNOSIS — Q231 Congenital insufficiency of aortic valve: Secondary | ICD-10-CM | POA: Insufficient documentation

## 2017-09-01 DIAGNOSIS — I712 Thoracic aortic aneurysm, without rupture, unspecified: Secondary | ICD-10-CM | POA: Insufficient documentation

## 2017-09-01 DIAGNOSIS — I350 Nonrheumatic aortic (valve) stenosis: Secondary | ICD-10-CM | POA: Insufficient documentation

## 2017-09-01 DIAGNOSIS — I351 Nonrheumatic aortic (valve) insufficiency: Secondary | ICD-10-CM | POA: Insufficient documentation

## 2017-09-01 NOTE — Telephone Encounter (Signed)
See below

## 2017-09-01 NOTE — Progress Notes (Addendum)
Cardiology Office Note    Date:  09/02/2017  ID:  Lawrence [name], DOB 25-Jan-1951, MRN [id number] PCP:  [name], MD  Cardiologist: Dr. Domenic Polite  Chief Complaint: discuss cath  History of Present Illness:  Lawrence [name] is a 66 y.o. 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   He has history of heart murmur for at least 10 years. He was originally diagnosed when he lived in [place] and had an echocardiogram done at that time but results were unknown. He is diasbled due to chronic back pain which is his main limitation. His wife died in 02-27-2012. He's lived in [place] for about 6 years. Per Dr. Myles Gip note from 02/2017 he had seen his PCP recently for polycythemia on screening lab work, hemoglobin 18.2. 2D echo 03/13/17 showed EF 60%, grade 1 DD, bicuspid aortic valve, mild AS, mild-mod AR, mild aortic root dilitation. Initially this was to be followed closely on a yearly basis. However, he developed chest pain and was seen in the ED 08/04/17 at which time CT angio revealed 5 cm fusiform aneurysm ascending aorta not previously known. It also showed aortic atherosclerosis.   He was seen by Dr. Servando Snare 10/18 who was concerned for his valvular disease in the context of 5 cm ascending aorta, and questionable risk factor of history of sudden death of a 6 year old brother. Dr. Servando Snare recommended f/u in clinic to discuss cardiac cath to evaluate for potential coronary disease and also pre-operatively in prep for possible replacement of ascending aorta. He was advised to avoid heavy lifting or fluoroquinolones. Blood pressure was high so metoprolol was added. Per Dr. Everrett Coombe note, when reviewing indications for operative intervention, "Operative intervention to repair or replace the aortic root (sinuses) or replace the ascending aorta is reasonable in asymptomatic patients with BAV if  the diameter of the aortic root or ascending aorta is 5.0 cm or greater and an additional risk factor for dissection is present or if the patient is at low surgical risk and the surgery is performed by an experienced aortic surgical team in a center with established expertise in these procedures. (Class of recommendation IIa; Level of Evidence B-NR)." Last labs on file from 07/2017 showed normal troponins, LFTs, CBC, glucose 118, Cr 1.06. The patient also has history of tubular adenoma without evidence of malignancy in 08/2016 and is due to follow-up with GI as well. He states Dr. Servando Snare has asked him to make this appointment.  He returns for follow-up overall doing well. He does occasionally get atypical pains in his chest while lying down at night lasting only a few minutes, possibly worse lying on left or right side. They are short lived, not brought on by anything and resolve spontaneously. 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 No syncope. He feels well today. SBP is 136/78 with recheck by me 159/85. He does report being on a statin previously but does not really remember which one; he knows it "didn't agree with him" but doesn't remember other details.    Past Medical History:  Diagnosis Date  . Aortic atherosclerosis ([place])   . Aortic insufficiency   . Aortic stenosis   . Arthritis   . Bicuspid aortic valve    a. mild AS, mild-mod AI by echo 03/2017, 5cm thoracic aortic aneurysm by  CT 07/2017.  . Essential hypertension   . GERD (gastroesophageal reflux disease)   . Gout   . Thoracic aortic aneurysm ([place])    a. 07/2017: 5cm by CT.    Past Surgical History:  Procedure Laterality Date  . COLONOSCOPY N/A 08/29/2016   Procedure: COLONOSCOPY;  Surgeon: Danie Binder, MD;  [facility]: [facility];  Service: Endoscopy;  Laterality: N/A;  1030  . HERNIA REPAIR      Current Medications: Current Meds  Medication Sig   . allopurinol (ZYLOPRIM) 300 MG tablet Take 300 mg by mouth daily.   . cyclobenzaprine (FLEXERIL) 5 MG tablet Take 5 mg by mouth at bedtime as needed for muscle spasms.   Marland Kitchen ibuprofen (ADVIL,MOTRIN) 600 MG tablet Take 600 mg by mouth every 6 (six) hours as needed for pain.  Marland Kitchen lisinopril (PRINIVIL,ZESTRIL) 20 MG tablet Take 20 mg by mouth daily.  . metoprolol tartrate (LOPRESSOR) 25 MG tablet Take 1 tablet (25 mg total) by mouth 2 (two) times daily.  . nitroGLYCERIN (NITROSTAT) 0.3 MG SL tablet PLACE 1 TABLET UNDER THE TONGUE FOR CHEST PAIN; MAY USE ANOTHER T...  (REFER TO PRESCRIPTION NOTES).  . traMADol (ULTRAM) 50 MG tablet Take 1 tablet (50 mg total) by mouth every 6 (six) hours as needed.     Allergies:   Morphine and related   Social History   Social History  . Marital status: Widowed    Spouse name: N/A  . Number of children: N/A  . Years of education: N/A   Social History Main Topics  . Smoking status: Former Smoker    Packs/day: 2.50    Years: 25.00    Types: Cigarettes    Quit date: 08/02/1991  . Smokeless tobacco: Never Used  . Alcohol use Yes     Comment: Occasionally, with sporting events up to 4 at a time  . Drug use: No  . Sexual activity: Not Asked   Other Topics Concern  . None   Social History Narrative  . None     Family History:  Family History  Problem Relation Age of Onset  . Colon polyps Sister   . Heart attack Mother   . Stomach cancer Father   . Colon cancer Neg Hx     ROS:   Please see the history of present illness. Takes rare ibuprofen for chronic back issues All other systems are reviewed and otherwise negative.    PHYSICAL EXAM:   VS:  BP 136/78   Pulse (!) 55   Ht 5\' 10"  (1.778 m)   Wt 210 lb (95.3 kg)   SpO2 94%   BMI 30.13 kg/m   BMI: Body mass index is 30.13 kg/m. GEN: Well nourished, well developed WM, in no acute distress  HEENT: normocephalic, atraumatic Neck: no JVD, carotid bruits, or masses Cardiac: RRR; 2-3/6 SEM  at RUSB, preserved S2, no rubs or gallops, no edema  Respiratory:  clear to auscultation bilaterally, normal work of breathing GI: soft, nontender, nondistended, + BS MS: no deformity or atrophy  Skin: warm and dry, no rash Neuro:  Alert and Oriented x 3, Strength and sensation are intact, follows commands Psych: euthymic mood, full affect  Wt Readings from Last 3 Encounters:  09/02/17 210 lb (95.3 kg)  08/28/17 212 lb (96.2 kg)  08/04/17 210 lb (95.3 kg)      Studies/Labs Reviewed:   EKG:  EKG was not ordered today but reviewed from prior visit.  Recent Labs: 08/04/2017: ALT 31;  BUN 17; Creatinine, Ser 1.06; Hemoglobin 16.0; Platelets 172; Potassium 4.0; Sodium 142   Lipid Panel No results found for: CHOL, TRIG, HDL, CHOLHDL, VLDL, LDLCALC, LDLDIRECT  Additional studies/ records that were reviewed today include: Summarized above.    ASSESSMENT & PLAN:   1. Bicuspid aortic valve with mild aortic stenosis and mild to moderate aortic insufficiency - Dr. Servando Snare is currently following with plans for eventual surgery due to need for replacement of his ascending aorta. Pre-operative cardiac cath has been requested. Spoke with Dr. Domenic Polite who agrees with plan to proceed with [facility]. Risks and benefits of cardiac catheterization have been discussed with the patient. These include bleeding, infection, kidney damage, stroke, heart attack, death. The patient understands these risks and is willing to proceed. Will check pre-cath labs today. He wishes to proceed on Friday.  2. Chest pain - somewhat atypical in nature, ruled out in ED, EKG nonacute at that time. He's not had any signs of dissection. He's asymptomatic today. Will be able to fully evaluate with cath. Warning sx reviewed with patient. He was previously on aspirin but stopped taking it (no issues, just stopped). Per review with Dr. Domenic Polite, will resume low dose today. 3. Thoracic aortic aneurysm - as above. Being followed by Dr.  Servando Snare. 4. Essential HTN - blood pressure remains elevated today. He was started on metoprolol recently. Will increase lisinopril to 40mg  daily. I suspect he might actually require an additional agent as my initial personal repeat was around 170/95 then down to 159/85 on recheck at end of visit. If blood pressure remains elevated, may need to consider another addition such as amlodipine. HR is 55 so cannot titrate beta blocker further. We also discussed reduction of sodium in diet. Will check screening TSH. Also advised pt to monitor blood pressure at home and call if running >130/80. Ideally would like to see him around 120/80 or less given aforementioned aortic disease. 5. Aortic atherosclerosis - no prior lipid panel on file. He does report history of statin therapy in the past but does not recall details. I have asked nurse to call Dr. Juel Burrow office to get more information. He's not fasting today but only had cabbage so will add lipid panel to today's labs along with A1C to fully assess his risk factors for atherosclerosis given prior hyperglycemia without known diagnosis of DM. Would consider trial of alternative statin once more information is known about what he previously tried.   Disposition: F/u with Dr. McDowell/[facility] in 2 weeks post-cath.   Medication Adjustments/Labs and Tests Ordered: Current medicines are reviewed at length with the patient today.  Concerns regarding medicines are outlined above. Medication changes, Labs and Tests ordered today are summarized above and listed in the Patient Instructions accessible in Encounters.   Signed, Charlie Pitter, PA-C  09/02/2017 1:17 PM    [name] Location in [place]. [place], [place] [address] Ph: [phone]; Fax [phone]

## 2017-09-01 NOTE — Telephone Encounter (Signed)
LMOM, pt is aware that he is to wait to schedule his TCS after the procedures for his heart have been taken care of and he has been released from the doctor.

## 2017-09-01 NOTE — Telephone Encounter (Signed)
Routing to DS. He has appt at the heart center 09/02/17. See surgical consult note from 08/28/17.

## 2017-09-02 ENCOUNTER — Ambulatory Visit (INDEPENDENT_AMBULATORY_CARE_PROVIDER_SITE_OTHER): Payer: Medicare HMO | Admitting: Physician Assistant

## 2017-09-02 ENCOUNTER — Other Ambulatory Visit (HOSPITAL_COMMUNITY)
Admission: RE | Admit: 2017-09-02 | Discharge: 2017-09-02 | Disposition: A | Discharge status: Home / Self Care | Payer: Medicare HMO | Source: Ambulatory Visit | Attending: Physician Assistant | Admitting: Physician Assistant

## 2017-09-02 ENCOUNTER — Encounter: Payer: Self-pay | Admitting: *Deleted

## 2017-09-02 ENCOUNTER — Encounter: Payer: Self-pay | Admitting: Physician Assistant

## 2017-09-02 ENCOUNTER — Telehealth: Payer: Self-pay | Admitting: Physician Assistant

## 2017-09-02 VITALS — BP 159/85 | HR 55 | Ht 70.0 in | Wt 210.0 lb

## 2017-09-02 DIAGNOSIS — I400 Infective myocarditis: Secondary | ICD-10-CM | POA: Diagnosis not present

## 2017-09-02 DIAGNOSIS — I700 Atherosclerosis of aorta: Secondary | ICD-10-CM

## 2017-09-02 DIAGNOSIS — I351 Nonrheumatic aortic (valve) insufficiency: Secondary | ICD-10-CM | POA: Insufficient documentation

## 2017-09-02 DIAGNOSIS — Q231 Congenital insufficiency of aortic valve: Secondary | ICD-10-CM | POA: Diagnosis not present

## 2017-09-02 DIAGNOSIS — I350 Nonrheumatic aortic (valve) stenosis: Secondary | ICD-10-CM | POA: Diagnosis not present

## 2017-09-02 DIAGNOSIS — I712 Thoracic aortic aneurysm, without rupture, unspecified: Secondary | ICD-10-CM

## 2017-09-02 DIAGNOSIS — R0789 Other chest pain: Secondary | ICD-10-CM

## 2017-09-02 DIAGNOSIS — I10 Essential (primary) hypertension: Secondary | ICD-10-CM | POA: Diagnosis not present

## 2017-09-02 LAB — CBC WITH DIFFERENTIAL/PLATELET
Basophils Absolute: 0 10*3/uL (ref 0.0–0.1)
Basophils Relative: 1 %
EOS ABS: 0.3 10*3/uL (ref 0.0–0.7)
Eosinophils Relative: 5 %
HEMATOCRIT: 48.2 % (ref 39.0–52.0)
HEMOGLOBIN: 16.2 g/dL (ref 13.0–17.0)
LYMPHS ABS: 2 10*3/uL (ref 0.7–4.0)
LYMPHS PCT: 42 %
MCH: 31.2 pg (ref 26.0–34.0)
MCHC: 33.6 g/dL (ref 30.0–36.0)
MCV: 92.9 fL (ref 78.0–100.0)
MONOS PCT: 10 %
Monocytes Absolute: 0.5 10*3/uL (ref 0.1–1.0)
NEUTROS ABS: 2 10*3/uL (ref 1.7–7.7)
NEUTROS PCT: 42 %
Platelets: 167 10*3/uL (ref 150–400)
RBC: 5.19 MIL/uL (ref 4.22–5.81)
RDW: 12.9 % (ref 11.5–15.5)
WBC: 4.7 10*3/uL (ref 4.0–10.5)

## 2017-09-02 LAB — PROTIME-INR
INR: 1.06
PROTHROMBIN TIME: 13.7 s (ref 11.4–15.2)

## 2017-09-02 LAB — COMPREHENSIVE METABOLIC PANEL
ALK PHOS: 81 U/L (ref 38–126)
ALT: 28 U/L (ref 17–63)
ANION GAP: 8 (ref 5–15)
AST: 22 U/L (ref 15–41)
Albumin: 4.7 g/dL (ref 3.5–5.0)
BILIRUBIN TOTAL: 0.7 mg/dL (ref 0.3–1.2)
BUN: 22 mg/dL — ABNORMAL HIGH (ref 6–20)
CALCIUM: 9.6 mg/dL (ref 8.9–10.3)
CO2: 30 mmol/L (ref 22–32)
CREATININE: 1.1 mg/dL (ref 0.61–1.24)
Chloride: 104 mmol/L (ref 101–111)
Glucose, Bld: 115 mg/dL — ABNORMAL HIGH (ref 65–99)
Potassium: 4.5 mmol/L (ref 3.5–5.1)
Sodium: 142 mmol/L (ref 135–145)
TOTAL PROTEIN: 7 g/dL (ref 6.5–8.1)

## 2017-09-02 LAB — TSH: TSH: 1.304 u[IU]/mL (ref 0.350–4.500)

## 2017-09-02 LAB — LIPID PANEL
Cholesterol: 193 mg/dL (ref 0–200)
HDL: 37 mg/dL — AB (ref >40)
LDL Cholesterol: 129 mg/dL — ABNORMAL HIGH (ref 0–99)
TRIGLYCERIDES: 133 mg/dL (ref <150)
Total CHOL/HDL Ratio: 5.2 RATIO
VLDL: 27 mg/dL (ref 0–40)

## 2017-09-02 MED ORDER — LISINOPRIL 40 MG PO TABS: 40.0000 mg | ORAL_TABLET | Freq: Every day | ORAL | 3 refills | Status: DC

## 2017-09-02 MED ORDER — ASPIRIN EC 81 MG PO TBEC: 81.0000 mg | DELAYED_RELEASE_TABLET | Freq: Every day | ORAL | 3 refills | Status: DC

## 2017-09-02 NOTE — Telephone Encounter (Addendum)
Attempt to reach, got vm, [id number]   Patient called back, wanted to make sure he was not having bypass surgery.I explained the process of the heart cath and was satisfied.

## 2017-09-02 NOTE — Patient Instructions (Addendum)
Medication Instructions:  Your physician has recommended you make the following change in your medication:  Increase Lisinopril to 40 mg Daily    Labwork: Your physician recommends that you return for lab work in: Today    Testing/Procedures: Your physician has requested that you have a cardiac catheterization. Cardiac catheterization is used to diagnose and/or treat various heart conditions. Doctors may recommend this procedure for a number of different reasons. The most common reason is to evaluate chest pain. Chest pain can be a symptom of coronary artery disease (CAD), and cardiac catheterization can show whether plaque is narrowing or blocking your heart's arteries. This procedure is also used to evaluate the valves, as well as measure the blood flow and oxygen levels in different parts of your heart. For further information please visit [email or web address]. Please follow instruction sheet, as given.    Follow-Up: Your physician recommends that you schedule a follow-up appointment in: 2 Weeks    Any Other Special Instructions Will Be Listed Below (If Applicable).     If you need a refill on your cardiac medications before your next appointment, please call your pharmacy.    Please monitor your blood pressure occasionally at home. Call your doctor if you tend to get readings of greater than 130 on the top number or 80 on the bottom number.   Cooking With Less Salt Cooking with less salt is one way to reduce the amount of sodium you get from food. Depending on your condition and overall health, your health care provider or diet and nutrition specialist (dietitian) may recommend that you reduce your sodium intake. Most people should have less than 2,300 milligrams (mg) of sodium each day. If you have high blood pressure (hypertension), you may need to limit your sodium to 1,500 mg each day. Follow the tips below to help reduce your sodium intake. What do I need to know about cooking  with less salt? Shopping  Buy sodium-free or low-sodium products. Look for the following words on food labels: ? Low-sodium. ? Sodium-free. ? Reduced-sodium. ? No salt added. ? Unsalted.  Buy fresh or frozen vegetables. Avoid canned vegetables.  Avoid buying meats or protein foods that have been injected with broth or saline solution.  Avoid cured or smoked meats, such as hot dogs, bacon, salami, ham, and bologna. Reading food labels  Check the food label before buying or using packaged ingredients.  Look for products with no more than 140 mg of sodium in one serving.  Do not choose foods with salt as one of the first three ingredients on the ingredients list. If salt is one of the first three ingredients, it usually means the item is high in sodium, because ingredients are listed in order of amount in the food item. Cooking  Use herbs, seasonings without salt, and spices as substitutes for salt in foods.  Use sodium-free baking soda when baking.  Grill, braise, or roast foods to add flavor with less salt.  Avoid adding salt to pasta, rice, or hot cereals while cooking.  Drain and rinse canned vegetables before use.  Avoid adding salt when cooking sweets and desserts.  Cook with low-sodium ingredients. What are some salt alternatives? The following are herbs, seasonings, and spices that can be used instead of salt to give taste to your food. Herbs should be fresh or dried. Do not choose packaged mixes. Next to the name of the herb, spice, or seasoning are some examples of foods you can pair it with. Herbs  Bay leaves - Soups, meat and vegetable dishes, and spaghetti sauce.  Basil - [employer], soups, pasta, and fish dishes.  Cilantro - Meat, poultry, and vegetable dishes.  Chili powder - Marinades and Mexican dishes.  Chives - Salad dressings and potato dishes.  Cumin - Mexican dishes, couscous, and meat dishes.  Dill - Fish dishes, sauces, and  salads.  Fennel - Meat and vegetable dishes, breads, and cookies.  Garlic (do not use garlic salt) - [place] dishes, meat dishes, salad dressings, and sauces.  Marjoram - Soups, potato dishes, and meat dishes.  Oregano - Pizza and spaghetti sauce.  Parsley - Salads, soups, pasta, and meat dishes.  Rosemary - [place] dishes, salad dressings, soups, and red meats.  Saffron - Fish dishes, pasta, and some poultry dishes.  Sage - Stuffings and sauces.  Tarragon - Fish and [employer].  Thyme - Stuffing, meat, and fish dishes. Seasonings  Lemon juice - Fish dishes, poultry dishes, vegetables, and salads.  Vinegar - Salad dressings, vegetables, and fish dishes. Spices  Cinnamon - Sweet dishes, such as cakes, cookies, and puddings.  Cloves - Gingerbread, puddings, and marinades for meats.  Curry - Vegetable dishes, fish and poultry dishes, and stir-fry dishes.  Ginger - Vegetables dishes, fish dishes, and stir-fry dishes.  Nutmeg - Pasta, vegetables, poultry, fish dishes, and custard. What are some low-sodium ingredients and foods?  Fresh or frozen fruits and vegetables with no sauce added.  Fresh or frozen whole meats, poultry, and fish with no sauce added.  Eggs.  Noodles, pasta, quinoa, rice.  Shredded or puffed wheat or puffed rice.  Regular or quick oats.  Milk, yogurt, hard cheeses, and low-sodium cheeses. Good cheese choices include Swiss, [place]. Always check the label for the serving size and sodium content.  Unsalted butter or margarine.  Unsalted nuts.  Sherbet or ice cream (keep to  cup per serving).  Homemade pudding.  Sodium-free baking soda and baking powder. This is not a complete list of low-sodium ingredients and foods. Contact your dietitian for more options. Summary  Cooking with less salt is one way to reduce the amount of sodium that you get from food.  Buy sodium-free or low-sodium products.  Check the food  label before using or buying packaged ingredients.  Use herbs, seasonings without salt, and spices as substitutes for salt in foods. This information is not intended to replace advice given to you by your health care provider. Make sure you discuss any questions you have with your health care provider. Document Released: 10/28/2005 Document Revised: 11/05/2016 Document Reviewed: 11/05/2016 Elsevier Interactive Patient Education  2017 [employer].

## 2017-09-02 NOTE — Telephone Encounter (Signed)
Patient has questions regarding office visit today / tg

## 2017-09-03 ENCOUNTER — Telehealth: Payer: Self-pay

## 2017-09-03 LAB — HEMOGLOBIN A1C
Hgb A1c MFr Bld: 6 % — ABNORMAL HIGH (ref 4.8–5.6)
Mean Plasma Glucose: 126 mg/dL

## 2017-09-03 NOTE — Telephone Encounter (Signed)
Pt walked in office this morning asking about scheduling his TCS. Pt also has a heart procedure that needs attention. In notes, Dr.Fileds states that it's ok to do procedure after his heart procedure. Pt says his heart Dr.  Geraldine Solar him to have his TSC prior to his heart procedure.   I called cardiologist office to see if he needs to have his TCS before his heart procedure. Waiting on a response.

## 2017-09-04 ENCOUNTER — Telehealth: Payer: Self-pay | Admitting: *Deleted

## 2017-09-04 ENCOUNTER — Ambulatory Visit: Payer: Medicare HMO | Admitting: Physician Assistant

## 2017-09-04 DIAGNOSIS — I10 Essential (primary) hypertension: Secondary | ICD-10-CM

## 2017-09-04 MED ORDER — AMLODIPINE BESYLATE 5 MG PO TABS: 5.0000 mg | ORAL_TABLET | Freq: Every day | ORAL | 6 refills | Status: DC

## 2017-09-04 NOTE — Telephone Encounter (Signed)
Triage for TCS ASAP DX: PERSONAL HISTORY OF POLYPS, MOVIPREP SPLIT DOES RX OR SAMPLE. NEEDS TO FOLLOW A CLEAR LIQUID DIET ON DAY BEFORE COLONOSCOPY.

## 2017-09-04 NOTE — Telephone Encounter (Signed)
Patient reports take Lisinopril 40 mg Wed. And Thru. Pt will come for bmet on Tue. Pt voiced understanding

## 2017-09-04 NOTE — Clinical Note (Incomplete)
Per Dr. Everrett Coombe request, I called and spoke with Dr. Oneida Alar, requesting that she proceed with his scheduled colonoscopy before his heart surgery. He may have to be on blood thinners post op which would

## 2017-09-04 NOTE — Telephone Encounter (Signed)
-----   Message from Charlie Pitter, [place] sent at 09/04/2017  2:28 PM EDT ----- Can you find out if [name] took 40mg  of lisinopril yesterday and today? - If today was his first day of the increased dose, would ask him to give it a little time. If blood pressure remains elevated tomorrow at time of cath, they will give him IV medicine to bring it down.  - If he took 40mg  of lisinopril yesterday plus this morning and blood pressure presently is still >130/80, would add amlodipine 5mg  every evening.  Would also avoid ibuprofen for now as this can drive up pressure too.  Dayna Dunn PA-C    ----- Message ----- From: Levonne Hubert, LPN Sent: [id number]   2:23 PM To: Charlie Pitter, PA-C  Pt reports BP this morning at 10:30 of 147/107 and this afternoon at 1:30 of 147/95. He would like know if this is ok for cath tomorrow.

## 2017-09-04 NOTE — Telephone Encounter (Signed)
Spoke with nurse at Dr. Nevada Crane 's office who states that pt was on pravastatin. Adverse reaction is unknown but she will msg Dr. Nevada Crane and return my call.

## 2017-09-05 ENCOUNTER — Observation Stay (HOSPITAL_COMMUNITY)
Admission: RE | Admit: 2017-09-05 | Discharge: 2017-09-06 | Disposition: A | Discharge status: Home / Self Care | Payer: Medicare HMO | Source: Ambulatory Visit | Attending: Cardiology | Admitting: Cardiology

## 2017-09-05 ENCOUNTER — Encounter (HOSPITAL_COMMUNITY): Payer: Self-pay | Admitting: Cardiology

## 2017-09-05 ENCOUNTER — Encounter (HOSPITAL_COMMUNITY)
Admission: RE | Disposition: A | Discharge status: Home / Self Care | Payer: Self-pay | Source: Ambulatory Visit | Attending: Cardiology

## 2017-09-05 DIAGNOSIS — Z885 Allergy status to narcotic agent status: Secondary | ICD-10-CM | POA: Diagnosis not present

## 2017-09-05 DIAGNOSIS — I352 Nonrheumatic aortic (valve) stenosis with insufficiency: Principal | ICD-10-CM | POA: Insufficient documentation

## 2017-09-05 DIAGNOSIS — D751 Secondary polycythemia: Secondary | ICD-10-CM | POA: Diagnosis not present

## 2017-09-05 DIAGNOSIS — Z87891 Personal history of nicotine dependence: Secondary | ICD-10-CM | POA: Diagnosis not present

## 2017-09-05 DIAGNOSIS — I7121 Aneurysm of the ascending aorta, without rupture: Secondary | ICD-10-CM | POA: Diagnosis present

## 2017-09-05 DIAGNOSIS — Z7982 Long term (current) use of aspirin: Secondary | ICD-10-CM | POA: Insufficient documentation

## 2017-09-05 DIAGNOSIS — I10 Essential (primary) hypertension: Secondary | ICD-10-CM | POA: Insufficient documentation

## 2017-09-05 DIAGNOSIS — M549 Dorsalgia, unspecified: Secondary | ICD-10-CM | POA: Insufficient documentation

## 2017-09-05 DIAGNOSIS — M1990 Unspecified osteoarthritis, unspecified site: Secondary | ICD-10-CM | POA: Diagnosis not present

## 2017-09-05 DIAGNOSIS — I712 Thoracic aortic aneurysm, without rupture: Secondary | ICD-10-CM | POA: Diagnosis not present

## 2017-09-05 DIAGNOSIS — I700 Atherosclerosis of aorta: Secondary | ICD-10-CM | POA: Diagnosis not present

## 2017-09-05 DIAGNOSIS — I350 Nonrheumatic aortic (valve) stenosis: Secondary | ICD-10-CM

## 2017-09-05 DIAGNOSIS — R0789 Other chest pain: Secondary | ICD-10-CM | POA: Insufficient documentation

## 2017-09-05 DIAGNOSIS — Q2543 Congenital aneurysm of aorta: Secondary | ICD-10-CM | POA: Diagnosis present

## 2017-09-05 DIAGNOSIS — K219 Gastro-esophageal reflux disease without esophagitis: Secondary | ICD-10-CM | POA: Diagnosis not present

## 2017-09-05 DIAGNOSIS — I719 Aortic aneurysm of unspecified site, without rupture: Secondary | ICD-10-CM | POA: Diagnosis present

## 2017-09-05 DIAGNOSIS — M109 Gout, unspecified: Secondary | ICD-10-CM | POA: Insufficient documentation

## 2017-09-05 DIAGNOSIS — G8929 Other chronic pain: Secondary | ICD-10-CM | POA: Diagnosis not present

## 2017-09-05 DIAGNOSIS — Q231 Congenital insufficiency of aortic valve: Secondary | ICD-10-CM

## 2017-09-05 HISTORY — PX: RIGHT/LEFT HEART CATH AND CORONARY ANGIOGRAPHY: CATH118266

## 2017-09-05 LAB — POCT I-STAT 3, VENOUS BLOOD GAS (G3P V)
ACID-BASE DEFICIT: 1 mmol/L (ref 0.0–2.0)
Acid-base deficit: 1 mmol/L (ref 0.0–2.0)
BICARBONATE: 25.1 mmol/L (ref 20.0–28.0)
BICARBONATE: 25.2 mmol/L (ref 20.0–28.0)
O2 SAT: 71 %
O2 SAT: 72 %
PCO2 VEN: 45 mmHg (ref 44.0–60.0)
PH VEN: 7.354 (ref 7.250–7.430)
PO2 VEN: 40 mmHg (ref 32.0–45.0)
TCO2: 26 mmol/L (ref 22–32)
TCO2: 27 mmol/L (ref 22–32)
pCO2, Ven: 45.3 mmHg (ref 44.0–60.0)
pH, Ven: 7.354 (ref 7.250–7.430)
pO2, Ven: 39 mmHg (ref 32.0–45.0)

## 2017-09-05 LAB — CBC
HEMATOCRIT: 45.8 % (ref 39.0–52.0)
HEMOGLOBIN: 15.4 g/dL (ref 13.0–17.0)
MCH: 31.1 pg (ref 26.0–34.0)
MCHC: 33.6 g/dL (ref 30.0–36.0)
MCV: 92.5 fL (ref 78.0–100.0)
Platelets: 159 10*3/uL (ref 150–400)
RBC: 4.95 MIL/uL (ref 4.22–5.81)
RDW: 13.1 % (ref 11.5–15.5)
WBC: 4.6 10*3/uL (ref 4.0–10.5)

## 2017-09-05 SURGERY — RIGHT/LEFT HEART CATH AND CORONARY ANGIOGRAPHY
Anesthesia: LOCAL

## 2017-09-05 MED ORDER — SODIUM CHLORIDE 0.9 % IV SOLN: 250.0000 mL | INTRAVENOUS | Status: DC | PRN

## 2017-09-05 MED ORDER — SODIUM CHLORIDE 0.9% FLUSH: 3.0000 mL | INTRAVENOUS | Status: DC | PRN

## 2017-09-05 MED ORDER — FENTANYL CITRATE (PF) 100 MCG/2ML IJ SOLN
INTRAMUSCULAR | Status: AC
  Filled 2017-09-05: qty 2

## 2017-09-05 MED ORDER — HEPARIN SODIUM (PORCINE) 1000 UNIT/ML IJ SOLN
INTRAMUSCULAR | Status: DC | PRN
  Administered 2017-09-05: 5000 [IU] via INTRAVENOUS

## 2017-09-05 MED ORDER — HEPARIN SODIUM (PORCINE) 1000 UNIT/ML IJ SOLN
INTRAMUSCULAR | Status: AC
  Filled 2017-09-05: qty 1

## 2017-09-05 MED ORDER — NITROGLYCERIN 0.4 MG SL SUBL: 0.4000 mg | SUBLINGUAL_TABLET | SUBLINGUAL | Status: DC | PRN

## 2017-09-05 MED ORDER — LIDOCAINE HCL 2 % IJ SOLN
INTRAMUSCULAR | Status: AC
  Filled 2017-09-05: qty 20

## 2017-09-05 MED ORDER — FENTANYL CITRATE (PF) 100 MCG/2ML IJ SOLN
INTRAMUSCULAR | Status: DC | PRN
  Administered 2017-09-05 (×2): 25 ug via INTRAVENOUS

## 2017-09-05 MED ORDER — LIDOCAINE HCL 2 % IJ SOLN
INTRAMUSCULAR | Status: DC | PRN
  Administered 2017-09-05 (×2): 3 mL

## 2017-09-05 MED ORDER — MIDAZOLAM HCL 2 MG/2ML IJ SOLN
INTRAMUSCULAR | Status: AC
  Filled 2017-09-05: qty 2

## 2017-09-05 MED ORDER — CYCLOBENZAPRINE HCL 5 MG PO TABS: 5.0000 mg | ORAL_TABLET | Freq: Every evening | ORAL | Status: DC | PRN

## 2017-09-05 MED ORDER — METOPROLOL TARTRATE 25 MG PO TABS
25.0000 mg | ORAL_TABLET | Freq: Two times a day (BID) | ORAL | Status: DC
  Administered 2017-09-05 – 2017-09-06 (×2): 25 mg via ORAL
  Filled 2017-09-05 (×2): qty 1

## 2017-09-05 MED ORDER — MIDAZOLAM HCL 2 MG/2ML IJ SOLN
INTRAMUSCULAR | Status: DC | PRN
  Administered 2017-09-05 (×2): 1 mg via INTRAVENOUS

## 2017-09-05 MED ORDER — ACETAMINOPHEN 325 MG PO TABS: 650.0000 mg | ORAL_TABLET | ORAL | Status: DC | PRN

## 2017-09-05 MED ORDER — IOPAMIDOL (ISOVUE-370) INJECTION 76%
INTRAVENOUS | Status: AC
  Filled 2017-09-05: qty 100

## 2017-09-05 MED ORDER — ASPIRIN EC 81 MG PO TBEC
81.0000 mg | DELAYED_RELEASE_TABLET | Freq: Every day | ORAL | Status: DC
  Administered 2017-09-06: 81 mg via ORAL
  Filled 2017-09-05: qty 1

## 2017-09-05 MED ORDER — SODIUM CHLORIDE 0.9 % WEIGHT BASED INFUSION
3.0000 mL/kg/h | INTRAVENOUS | Status: AC
  Administered 2017-09-05: 3 mL/kg/h via INTRAVENOUS

## 2017-09-05 MED ORDER — IOPAMIDOL (ISOVUE-370) INJECTION 76%
INTRAVENOUS | Status: DC | PRN
  Administered 2017-09-05: 80 mL via INTRA_ARTERIAL

## 2017-09-05 MED ORDER — VERAPAMIL HCL 2.5 MG/ML IV SOLN
INTRAVENOUS | Status: AC
  Filled 2017-09-05: qty 2

## 2017-09-05 MED ORDER — SODIUM CHLORIDE 0.9 % WEIGHT BASED INFUSION: 1.0000 mL/kg/h | INTRAVENOUS | Status: AC

## 2017-09-05 MED ORDER — HEPARIN (PORCINE) IN NACL 2-0.9 UNIT/ML-% IJ SOLN
INTRAMUSCULAR | Status: AC | PRN
  Administered 2017-09-05: 1000 mL

## 2017-09-05 MED ORDER — ASPIRIN 81 MG PO CHEW
CHEWABLE_TABLET | ORAL | Status: AC
  Administered 2017-09-05: 81 mg via ORAL
  Filled 2017-09-05: qty 1

## 2017-09-05 MED ORDER — ONDANSETRON HCL 4 MG/2ML IJ SOLN: 4.0000 mg | Freq: Four times a day (QID) | INTRAMUSCULAR | Status: DC | PRN

## 2017-09-05 MED ORDER — SODIUM CHLORIDE 0.9 % WEIGHT BASED INFUSION: 1.0000 mL/kg/h | INTRAVENOUS | Status: DC

## 2017-09-05 MED ORDER — PNEUMOCOCCAL VAC POLYVALENT 25 MCG/0.5ML IJ INJ
0.5000 mL | INJECTION | INTRAMUSCULAR | Status: DC
Start: 2017-09-06 — End: 2017-09-06
  Filled 2017-09-05: qty 0.5

## 2017-09-05 MED ORDER — HEPARIN SODIUM (PORCINE) 5000 UNIT/ML IJ SOLN: 5000.0000 [IU] | Freq: Three times a day (TID) | INTRAMUSCULAR | Status: DC

## 2017-09-05 MED ORDER — SODIUM CHLORIDE 0.9% FLUSH
3.0000 mL | Freq: Two times a day (BID) | INTRAVENOUS | Status: DC
  Administered 2017-09-05 – 2017-09-06 (×2): 3 mL via INTRAVENOUS

## 2017-09-05 MED ORDER — ASPIRIN EC 81 MG PO TBEC: 81.0000 mg | DELAYED_RELEASE_TABLET | Freq: Every day | ORAL | Status: DC

## 2017-09-05 MED ORDER — TRAMADOL HCL 50 MG PO TABS
50.0000 mg | ORAL_TABLET | Freq: Four times a day (QID) | ORAL | Status: DC | PRN
Start: 2017-09-05 — End: 2017-09-06

## 2017-09-05 MED ORDER — HEPARIN (PORCINE) IN NACL 2-0.9 UNIT/ML-% IJ SOLN
INTRAMUSCULAR | Status: AC
  Filled 2017-09-05: qty 1000

## 2017-09-05 MED ORDER — NITROGLYCERIN 0.3 MG SL SUBL: 0.3000 mg | SUBLINGUAL_TABLET | SUBLINGUAL | Status: DC | PRN

## 2017-09-05 MED ORDER — AMLODIPINE BESYLATE 5 MG PO TABS: 5.0000 mg | ORAL_TABLET | Freq: Every day | ORAL | Status: DC

## 2017-09-05 MED ORDER — HEPARIN SODIUM (PORCINE) 5000 UNIT/ML IJ SOLN
5000.0000 [IU] | Freq: Three times a day (TID) | INTRAMUSCULAR | Status: DC
  Filled 2017-09-05: qty 1

## 2017-09-05 MED ORDER — SODIUM CHLORIDE 0.9% FLUSH
3.0000 mL | Freq: Two times a day (BID) | INTRAVENOUS | Status: DC
  Administered 2017-09-05: 3 mL via INTRAVENOUS

## 2017-09-05 MED ORDER — LISINOPRIL 40 MG PO TABS
40.0000 mg | ORAL_TABLET | Freq: Every day | ORAL | Status: DC
  Administered 2017-09-06: 40 mg via ORAL
  Filled 2017-09-05: qty 1

## 2017-09-05 MED ORDER — ALLOPURINOL 300 MG PO TABS: 300.0000 mg | ORAL_TABLET | Freq: Every day | ORAL | Status: DC | PRN

## 2017-09-05 MED ORDER — ASPIRIN 81 MG PO CHEW
81.0000 mg | CHEWABLE_TABLET | ORAL | Status: AC
  Administered 2017-09-05: 81 mg via ORAL

## 2017-09-05 SURGICAL SUPPLY — 13 items
CATH BALLN WEDGE 5F 110CM (CATHETERS) ×2 IMPLANT
CATH IMPULSE 5F ANG/FL3.5 (CATHETERS) ×2 IMPLANT
CATH INFINITI 5 FR 3DRC (CATHETERS) ×2 IMPLANT
DEVICE RAD COMP TR BAND LRG (VASCULAR PRODUCTS) ×2 IMPLANT
GLIDESHEATH SLEND SS 6F .021 (SHEATH) ×2 IMPLANT
GUIDEWIRE INQWIRE 1.5J.035X260 (WIRE) ×1 IMPLANT
INQWIRE 1.5J .035X260CM (WIRE) ×2
KIT HEART LEFT (KITS) ×2 IMPLANT
PACK CARDIAC CATHETERIZATION (CUSTOM PROCEDURE TRAY) ×2 IMPLANT
SHEATH GLIDE SLENDER 4/5FR (SHEATH) ×2 IMPLANT
SYR MEDRAD MARK V 150ML (SYRINGE) ×2 IMPLANT
TRANSDUCER W/STOPCOCK (MISCELLANEOUS) ×2 IMPLANT
TUBING CIL FLEX 10 FLL-RA (TUBING) ×2 IMPLANT

## 2017-09-05 NOTE — H&P (View-Only) (Signed)
Cardiology Office Note    Date:  09/02/2017  ID:  Lawrence [name], DOB [birth year], MRN [id number] PCP:  [name], MD  Cardiologist: Dr. Domenic Polite  Chief Complaint: discuss cath  History of Present Illness:  Lawrence [name] is a 66 y.o. 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   He has history of heart murmur for at least 10 years. He was originally diagnosed when he lived in [place] and had an echocardiogram done at that time but results were unknown. He is diasbled due to chronic back pain which is his main limitation. His wife died in 02/13/12. He's lived in [place] for about 6 years. Per Dr. Myles Gip note from 02/2017 he had seen his PCP recently for polycythemia on screening lab work, hemoglobin 18.2. 2D echo 03/13/17 showed EF 60%, grade 1 DD, bicuspid aortic valve, mild AS, mild-mod AR, mild aortic root dilitation. Initially this was to be followed closely on a yearly basis. However, he developed chest pain and was seen in the ED 08/04/17 at which time CT angio revealed 5 cm fusiform aneurysm ascending aorta not previously known. It also showed aortic atherosclerosis.   He was seen by Dr. Servando Snare 10/18 who was concerned for his valvular disease in the context of 5 cm ascending aorta, and questionable risk factor of history of sudden death of a 1 year old brother. Dr. Servando Snare recommended f/u in clinic to discuss cardiac cath to evaluate for potential coronary disease and also pre-operatively in prep for possible replacement of ascending aorta. He was advised to avoid heavy lifting or fluoroquinolones. Blood pressure was high so metoprolol was added. Per Dr. Everrett Coombe note, when reviewing indications for operative intervention, "Operative intervention to repair or replace the aortic root (sinuses) or replace the ascending aorta is reasonable in asymptomatic patients with BAV if  the diameter of the aortic root or ascending aorta is 5.0 cm or greater and an additional risk factor for dissection is present or if the patient is at low surgical risk and the surgery is performed by an experienced aortic surgical team in a center with established expertise in these procedures. (Class of recommendation IIa; Level of Evidence B-NR)." Last labs on file from 07/2017 showed normal troponins, LFTs, CBC, glucose 118, Cr 1.06. The patient also has history of tubular adenoma without evidence of malignancy in 08/2016 and is due to follow-up with GI as well. He states Dr. Servando Snare has asked him to make this appointment.  He returns for follow-up overall doing well. He does occasionally get atypical pains in his chest while lying down at night lasting only a few minutes, possibly worse lying on left or right side. They are short lived, not brought on by anything and resolve spontaneously. 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 No syncope. He feels well today. SBP is 136/78 with recheck by me 159/85. He does report being on a statin previously but does not really remember which one; he knows it "didn't agree with him" but doesn't remember other details.    Past Medical History:  Diagnosis Date  . Aortic atherosclerosis ([place])   . Aortic insufficiency   . Aortic stenosis   . Arthritis   . Bicuspid aortic valve    a. mild AS, mild-mod AI by echo 03/2017, 5cm thoracic aortic aneurysm by  CT 07/2017.  . Essential hypertension   . GERD (gastroesophageal reflux disease)   . Gout   . Thoracic aortic aneurysm ([place])    a. 07/2017: 5cm by CT.    Past Surgical History:  Procedure Laterality Date  . COLONOSCOPY N/A 08/29/2016   Procedure: COLONOSCOPY;  Surgeon: Danie Binder, MD;  Location: [facility];  Service: Endoscopy;  Laterality: N/A;  1030  . HERNIA REPAIR      Current Medications: Current Meds  Medication Sig   . allopurinol (ZYLOPRIM) 300 MG tablet Take 300 mg by mouth daily.   . cyclobenzaprine (FLEXERIL) 5 MG tablet Take 5 mg by mouth at bedtime as needed for muscle spasms.   Marland Kitchen ibuprofen (ADVIL,MOTRIN) 600 MG tablet Take 600 mg by mouth every 6 (six) hours as needed for pain.  Marland Kitchen lisinopril (PRINIVIL,ZESTRIL) 20 MG tablet Take 20 mg by mouth daily.  . metoprolol tartrate (LOPRESSOR) 25 MG tablet Take 1 tablet (25 mg total) by mouth 2 (two) times daily.  . nitroGLYCERIN (NITROSTAT) 0.3 MG SL tablet PLACE 1 TABLET UNDER THE TONGUE FOR CHEST PAIN; MAY USE ANOTHER T...  (REFER TO PRESCRIPTION NOTES).  . traMADol (ULTRAM) 50 MG tablet Take 1 tablet (50 mg total) by mouth every 6 (six) hours as needed.     Allergies:   Morphine and related   Social History   Social History  . Marital status: Widowed    Spouse name: N/A  . Number of children: N/A  . Years of education: N/A   Social History Main Topics  . Smoking status: Former Smoker    Packs/day: 2.50    Years: 25.00    Types: Cigarettes    Quit date: 08/02/1991  . Smokeless tobacco: Never Used  . Alcohol use Yes     Comment: Occasionally, with sporting events up to 4 at a time  . Drug use: No  . Sexual activity: Not Asked   Other Topics Concern  . None   Social History Narrative  . None     Family History:  Family History  Problem Relation Age of Onset  . Colon polyps Sister   . Heart attack Mother   . Stomach cancer Father   . Colon cancer Neg Hx     ROS:   Please see the history of present illness. Takes rare ibuprofen for chronic back issues All other systems are reviewed and otherwise negative.    PHYSICAL EXAM:   VS:  BP 136/78   Pulse (!) 55   Ht 5\' 10"  (1.778 m)   Wt 210 lb (95.3 kg)   SpO2 94%   BMI 30.13 kg/m   BMI: Body mass index is 30.13 kg/m. GEN: Well nourished, well developed WM, in no acute distress  HEENT: normocephalic, atraumatic Neck: no JVD, carotid bruits, or masses Cardiac: RRR; 2-3/6 SEM  at RUSB, preserved S2, no rubs or gallops, no edema  Respiratory:  clear to auscultation bilaterally, normal work of breathing GI: soft, nontender, nondistended, + BS MS: no deformity or atrophy  Skin: warm and dry, no rash Neuro:  Alert and Oriented x 3, Strength and sensation are intact, follows commands Psych: euthymic mood, full affect  Wt Readings from Last 3 Encounters:  09/02/17 210 lb (95.3 kg)  08/28/17 212 lb (96.2 kg)  08/04/17 210 lb (95.3 kg)      Studies/Labs Reviewed:   EKG:  EKG was not ordered today but reviewed from prior visit.  Recent Labs: 08/04/2017: ALT 31;  BUN 17; Creatinine, Ser 1.06; Hemoglobin 16.0; Platelets 172; Potassium 4.0; Sodium 142   Lipid Panel No results found for: CHOL, TRIG, HDL, CHOLHDL, VLDL, LDLCALC, LDLDIRECT  Additional studies/ records that were reviewed today include: Summarized above.    ASSESSMENT & PLAN:   1. Bicuspid aortic valve with mild aortic stenosis and mild to moderate aortic insufficiency - Dr. Servando Snare is currently following with plans for eventual surgery due to need for replacement of his ascending aorta. Pre-operative cardiac cath has been requested. Spoke with Dr. Domenic Polite who agrees with plan to proceed with [facility]. Risks and benefits of cardiac catheterization have been discussed with the patient. These include bleeding, infection, kidney damage, stroke, heart attack, death. The patient understands these risks and is willing to proceed. Will check pre-cath labs today. He wishes to proceed on Friday.  2. Chest pain - somewhat atypical in nature, ruled out in ED, EKG nonacute at that time. He's not had any signs of dissection. He's asymptomatic today. Will be able to fully evaluate with cath. Warning sx reviewed with patient. He was previously on aspirin but stopped taking it (no issues, just stopped). Per review with Dr. Domenic Polite, will resume low dose today. 3. Thoracic aortic aneurysm - as above. Being followed by Dr.  Servando Snare. 4. Essential HTN - blood pressure remains elevated today. He was started on metoprolol recently. Will increase lisinopril to 40mg  daily. I suspect he might actually require an additional agent as my initial personal repeat was around 170/95 then down to 159/85 on recheck at end of visit. If blood pressure remains elevated, may need to consider another addition such as amlodipine. HR is 55 so cannot titrate beta blocker further. We also discussed reduction of sodium in diet. Will check screening TSH. Also advised pt to monitor blood pressure at home and call if running >130/80. Ideally would like to see him around 120/80 or less given aforementioned aortic disease. 5. Aortic atherosclerosis - no prior lipid panel on file. He does report history of statin therapy in the past but does not recall details. I have asked nurse to call Dr. Juel Burrow office to get more information. He's not fasting today but only had cabbage so will add lipid panel to today's labs along with A1C to fully assess his risk factors for atherosclerosis given prior hyperglycemia without known diagnosis of DM. Would consider trial of alternative statin once more information is known about what he previously tried.   Disposition: F/u with Dr. McDowell/[facility] in 2 weeks post-cath.   Medication Adjustments/Labs and Tests Ordered: Current medicines are reviewed at length with the patient today.  Concerns regarding medicines are outlined above. Medication changes, Labs and Tests ordered today are summarized above and listed in the Patient Instructions accessible in Encounters.   Signed, Charlie Pitter, PA-C  09/02/2017 1:17 PM    [facility] Location in [place]. [place], [place] [address] Ph: [phone]; Fax [phone]

## 2017-09-05 NOTE — Progress Notes (Signed)
Patient completed catheterization with no complication.    He has no one to stay with him at home tonight so will be admitted overnight for observation and discharged home tomorrow morning.   Loralie Champagne 09/05/2017 10:26 AM

## 2017-09-05 NOTE — Progress Notes (Signed)
Pt states he has no one to take him home or  be with him at home tonight .  He states he wants to spend the night and does not feel comfortable going home by himself.  He states he has already checked with his insurance co and gotten approval. Eliezer Champagne told of pt's wishes.

## 2017-09-05 NOTE — Interval H&P Note (Signed)
History and Physical Interval Note:  09/05/2017 9:34 AM  Lawrence [name]  has presented today for surgery, with the diagnosis of arotic stenosis - preop  The various methods of treatment have been discussed with the patient and family. After consideration of risks, benefits and other options for treatment, the patient has consented to  Procedure(s): RIGHT/LEFT HEART CATH AND CORONARY ANGIOGRAPHY (N/A) as a surgical intervention .  The patient's history has been reviewed, patient examined, no change in status, stable for surgery.  I have reviewed the patient's chart and labs.  Questions were answered to the patient's satisfaction.     Jamaris Biernat [employer]

## 2017-09-05 NOTE — Progress Notes (Signed)
Site area: Right brachial a 7 french venous sheath was removed  Site Prior to Removal:  Level 0  Pressure Applied For 10 MINUTES    Bedrest Beginning at  1050a  Manual:   Yes.    Patient Status During Pull:  stable  Post Pull Groin Site:  Level 0  Post Pull Instructions Given:  Yes.    Post Pull Pulses Present:  Yes.    Dressing Applied:  Yes.    Comments:  VS remain stable during sheath pull

## 2017-09-05 NOTE — Progress Notes (Signed)
TR BAND REMOVAL  LOCATION:    Right radial  DEFLATED PER PROTOCOL:    Yes.    TIME BAND OFF / DRESSING APPLIED:    1320p   SITE UPON ARRIVAL:    Level 0  SITE AFTER BAND REMOVAL:    Level 0  CIRCULATION SENSATION AND MOVEMENT:    Within Normal Limits   Yes.    COMMENTS:   Coban used as a pressure dressing.

## 2017-09-06 DIAGNOSIS — Z885 Allergy status to narcotic agent status: Secondary | ICD-10-CM | POA: Diagnosis not present

## 2017-09-06 DIAGNOSIS — G8929 Other chronic pain: Secondary | ICD-10-CM | POA: Diagnosis not present

## 2017-09-06 DIAGNOSIS — M1990 Unspecified osteoarthritis, unspecified site: Secondary | ICD-10-CM | POA: Diagnosis not present

## 2017-09-06 DIAGNOSIS — D751 Secondary polycythemia: Secondary | ICD-10-CM | POA: Diagnosis not present

## 2017-09-06 DIAGNOSIS — M109 Gout, unspecified: Secondary | ICD-10-CM | POA: Diagnosis not present

## 2017-09-06 DIAGNOSIS — I719 Aortic aneurysm of unspecified site, without rupture: Secondary | ICD-10-CM

## 2017-09-06 DIAGNOSIS — I10 Essential (primary) hypertension: Secondary | ICD-10-CM | POA: Diagnosis not present

## 2017-09-06 DIAGNOSIS — M549 Dorsalgia, unspecified: Secondary | ICD-10-CM | POA: Diagnosis not present

## 2017-09-06 DIAGNOSIS — I352 Nonrheumatic aortic (valve) stenosis with insufficiency: Secondary | ICD-10-CM | POA: Diagnosis not present

## 2017-09-06 DIAGNOSIS — K219 Gastro-esophageal reflux disease without esophagitis: Secondary | ICD-10-CM | POA: Diagnosis not present

## 2017-09-06 LAB — HIV ANTIBODY (ROUTINE TESTING W REFLEX): HIV SCREEN 4TH GENERATION: NONREACTIVE

## 2017-09-06 NOTE — Discharge Summary (Signed)
.     Discharge Summary    Patient ID: Lawrence [name],  MRN: [id number], DOB/AGE: Nov 14, 1950 66 y.o.  Admit date: 09/05/2017 Discharge date: 09/06/2017  Primary Care Provider: [name] Primary Cardiologist: Dr. Domenic Polite  Discharge Diagnoses    Active Problems:   Aortic stenosis   Aortic root aneurysm (H[facility])   Valvular heart disease   HTN  Allergies Allergies  Allergen Reactions  . Morphine And Related Other (See Comments)    Caused patient to pass out.     Diagnostic Studies/Procedures    RIGHT/LEFT HEART CATH AND CORONARY ANGIOGRAPHY  Conclusion   1. Normal right and left heart filling pressures and preserved cardiac output.  2. Normal coronaries.      History of Present Illness     Lawrence [name] is a 66 y.o. 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   He has history of heart murmur for at least 10 years. He was originally diagnosed when he lived in [place] and had an echocardiogram done at that time but results were unknown. He is diasbled due to chronic back pain which is his main limitation. His wife died in 02/17/12. He's lived in [place] for about 6 years. Per Dr. Myles Gip note from 02/2017 he had seen his PCP recently for polycythemia on screening lab work, hemoglobin 18.2. 2D echo 03/13/17 showed EF 60%, grade 1 DD, bicuspid aortic valve, mild AS, mild-mod AR, mild aortic root dilitation. Initially this was to be followed closely on a yearly basis. However, he developed chest pain and was seen in the ED 08/04/17 at which time CT angio revealed 5 cm fusiform aneurysm ascending aorta not previously known. It also showed aortic atherosclerosis.   He was seen by Dr. Servando Snare 10/18 who was concerned for his valvular disease in the context of 5 cm ascending aorta, and questionable risk factor of history of sudden death of a 58 year old brother. Dr. Servando Snare recommended  f/u in clinic to discuss cardiac cath to evaluate for potential coronary disease and also pre-operatively in prep for possible replacement of ascending aorta. He was advised to avoid heavy lifting or fluoroquinolones.   Hospital Course     Consultants: None  Patient presented for outpatient cath that showed normal coronaries with normal filling pressure. He kept overnight due to no one able to stay with him overnight. No complications. Cath site looks great.   The patient has been seen by Dr. Curt Bears  today and deemed ready for discharge home. All follow-up appointments have been scheduled. Discharge medications are listed below.  _____________   Discharge Vitals Blood pressure (!) 160/98, pulse (!) 52, temperature 97.6 F (36.4 C), temperature source Oral, resp. rate 18, height 5\' 10"  (1.778 m), weight 203 lb 8 oz (92.3 kg), SpO2 97 %.  Filed Weights   09/05/17 0822 09/05/17 1333 09/06/17 0701  Weight: 210 lb (95.3 kg) 206 lb 1 oz (93.5 kg) 203 lb 8 oz (92.3 kg)   Physical Exam  Constitutional: He is oriented to person, place, and time and well-developed, well-nourished, and in no distress.  HENT:  Head: Normocephalic and atraumatic.  Eyes: Pupils are equal, round, and reactive to light. Conjunctivae are normal.  Neck: Normal range of motion. No JVD present.  Cardiovascular: Normal rate, regular rhythm and normal heart sounds.   R radial cath site without hematoma or bruise  Pulmonary/Chest: Effort normal and breath sounds normal.  Abdominal:  Soft. Bowel sounds are normal.  Musculoskeletal: Normal range of motion.  Neurological: He is alert and oriented to person, place, and time.  Skin: Skin is warm and dry.    Labs & Radiologic Studies    CBC  Recent Labs  09/05/17 1405  WBC 4.6  HGB 15.4  HCT 45.8  MCV 92.5  PLT 159    Disposition   Pt is being discharged home today in good condition.  Follow-up Plans & Appointments    Follow-up Information    Lendon Colonel, NP. Go on 09/16/2017.   Specialties:  Nurse Practitioner, Radiology, Cardiology Why:  @2pm   Contact information: [place] [address] [phone]          Discharge Instructions    Diet - low sodium heart healthy    Complete by:  As directed    Discharge instructions    Complete by:  As directed    No driving for 48 hours. No sexual activity for 1 week.  Keep procedure site clean & dry. If you notice increased pain, swelling, bleeding or pus, call/return!  You may shower, but no soaking baths/hot tubs/pools for 1 week.   Increase activity slowly    Complete by:  As directed       Discharge Medications   Current Discharge Medication List    CONTINUE these medications which have NOT CHANGED   Details  aspirin EC 81 MG tablet Take 1 tablet (81 mg total) by mouth daily. Qty: 90 tablet, Refills: 3    lisinopril (PRINIVIL,ZESTRIL) 40 MG tablet Take 1 tablet (40 mg total) by mouth daily. Qty: 90 tablet, Refills: 3    metoprolol tartrate (LOPRESSOR) 25 MG tablet Take 1 tablet (25 mg total) by mouth 2 (two) times daily. Qty: 60 tablet, Refills: 1    allopurinol (ZYLOPRIM) 300 MG tablet Take 300 mg by mouth daily as needed (gout flare).     amLODipine (NORVASC) 5 MG tablet Take 1 tablet (5 mg total) by mouth daily. Qty: 30 tablet, Refills: 6    calcium carbonate (TUMS - DOSED IN MG ELEMENTAL CALCIUM) 500 MG chewable tablet Chew 1-2 tablets by mouth daily as needed for indigestion or heartburn.    cyclobenzaprine (FLEXERIL) 5 MG tablet Take 5 mg by mouth at bedtime as needed for muscle spasms.     ibuprofen (ADVIL,MOTRIN) 600 MG tablet Take 600 mg by mouth 3 (three) times daily as needed for moderate pain.     nitroGLYCERIN (NITROSTAT) 0.3 MG SL tablet Place 0.3 mg under the tongue every 5 (five) minutes as needed for chest pain.    traMADol (ULTRAM) 50 MG tablet Take 1 tablet (50 mg total) by mouth every 6 (six) hours as needed. Qty: 20 tablet,  Refills: 0          Outstanding Labs/Studies   none  Duration of Discharge Encounter   Greater than 30 minutes including physician time.  Signed, Bhagat,Bhavinkumar PA-C 09/06/2017, 11:16 AM  I have seen and examined this patient with Vin Bhagat.  Agree with above, note added to reflect my findings.  On exam, RRR, no murmurs, lungs clear. Patient presented yesterday for left heart catheterization as a pre-op for aortic surgery. Patient stayed overnight as no one was able to evaluate him at home, with no family around. Cath site stable. Plan for discharge with follow-up in clinic.  Orlanda Lemmerman M. Berlyn Saylor MD 09/06/2017 11:19 AM

## 2017-09-06 NOTE — Progress Notes (Signed)
Bath offered and pt stated that he will wait till he gets home

## 2017-09-06 NOTE — Progress Notes (Signed)
Patient given discharge instructions and all questions answered.  

## 2017-09-08 ENCOUNTER — Telehealth: Payer: Self-pay | Admitting: Adult Health

## 2017-09-08 MED FILL — Verapamil HCl IV Soln 2.5 MG/ML: INTRAVENOUS | Qty: 2 | Status: AC

## 2017-09-08 NOTE — Telephone Encounter (Signed)
Patient wanted to verify all his medications regarding his BP meds, reaffirmed that he is taking what is ordered

## 2017-09-08 NOTE — Telephone Encounter (Signed)
Please give pt a call--has questions about his BP meds

## 2017-09-09 ENCOUNTER — Other Ambulatory Visit (HOSPITAL_COMMUNITY)
Admission: RE | Admit: 2017-09-09 | Discharge: 2017-09-09 | Disposition: A | Discharge status: Home / Self Care | Payer: Medicare HMO | Source: Ambulatory Visit | Attending: Physician Assistant | Admitting: Physician Assistant

## 2017-09-09 ENCOUNTER — Telehealth: Payer: Self-pay | Admitting: Physician Assistant

## 2017-09-09 DIAGNOSIS — I10 Essential (primary) hypertension: Secondary | ICD-10-CM | POA: Diagnosis not present

## 2017-09-09 LAB — BASIC METABOLIC PANEL
Anion gap: 8 (ref 5–15)
BUN: 21 mg/dL — AB (ref 6–20)
CHLORIDE: 104 mmol/L (ref 101–111)
CO2: 29 mmol/L (ref 22–32)
CREATININE: 1.09 mg/dL (ref 0.61–1.24)
Calcium: 9.4 mg/dL (ref 8.9–10.3)
Glucose, Bld: 110 mg/dL — ABNORMAL HIGH (ref 65–99)
POTASSIUM: 4.1 mmol/L (ref 3.5–5.1)
SODIUM: 141 mmol/L (ref 135–145)

## 2017-09-09 NOTE — Telephone Encounter (Signed)
Patient walked into clinic today, is having lab work done now

## 2017-09-09 NOTE — Telephone Encounter (Signed)
Pt needs to know when he needs to have his lab work done

## 2017-09-12 ENCOUNTER — Telehealth: Payer: Self-pay | Admitting: *Deleted

## 2017-09-12 NOTE — Telephone Encounter (Signed)
-----   Message from Charlie Pitter, [place] sent at 09/09/2017  3:38 PM EDT ----- Please let patient know labs were stable. Dayna Dunn PA-C

## 2017-09-12 NOTE — Telephone Encounter (Signed)
Called patient with test results. No answer.Phone busy.

## 2017-09-15 NOTE — Progress Notes (Signed)
Cardiology Office Note   Date:  09/16/2017   ID:  Lawrence [name], DOB Feb 03, 1951, MRN [id number]  PCP:  Lawrence Squibb, MD  Cardiologist: Lawrence Sark,  MD.  Chief Complaint  Patient presents with  . Hypertension  . AAA      History of Present Illness: Lawrence [name] is a 66 y.o. male who presents for ongoing assessment and management aortic valve disease, hypertension, with aortic root aneurysm, status post hospitalization.  The patient underwent right and left coronary angiography revealing normal right and left heart filling pressures with preserved cardiac output and normal coronaries.  Other history includes GERD, polycythemia, bicuspid aortic valve with thoracic aortic aneurysm.  He is also disabled due to chronic back pain.  The patient was found to have a thoracic aortic aneurysm and a bicuspid aortic valve.  Echocardiogram size the aortic valve at 5 cm.  He was not found to have any coronary artery disease.  The patient had mild to moderate aortic valve stenosis.  He has recently been placed on metoprolol and lisinopril.  At first he felt a little sluggish but now feels well and is able to tolerate the medication without problems.  He denies chest pain or dyspnea.  Past Medical History:  Diagnosis Date  . Aortic atherosclerosis ([place])   . Aortic insufficiency   . Aortic stenosis   . Arthritis   . Bicuspid aortic valve    a. mild AS, mild-mod AI by echo 03/2017, 5cm thoracic aortic aneurysm by CT 07/2017.  . Essential hypertension   . GERD (gastroesophageal reflux disease)   . Gout   . Thoracic aortic aneurysm ([place])    a. 07/2017: 5cm by CT.    Past Surgical History:  Procedure Laterality Date  . HERNIA REPAIR       Current Outpatient Medications  Medication Sig Dispense Refill  . allopurinol (ZYLOPRIM) 300 MG tablet Take 300 mg by mouth daily as needed (gout flare).     Marland Kitchen amLODipine (NORVASC) 5 MG tablet Take 1 tablet (5 mg total) by mouth daily. 30 tablet 6  .  aspirin EC 81 MG tablet Take 1 tablet (81 mg total) by mouth daily. 90 tablet 3  . calcium carbonate (TUMS - DOSED IN MG ELEMENTAL CALCIUM) 500 MG chewable tablet Chew 1-2 tablets by mouth daily as needed for indigestion or heartburn.    . cyclobenzaprine (FLEXERIL) 5 MG tablet Take 5 mg by mouth at bedtime as needed for muscle spasms.     Marland Kitchen ibuprofen (ADVIL,MOTRIN) 600 MG tablet Take 600 mg by mouth 3 (three) times daily as needed for moderate pain.     Marland Kitchen lisinopril (PRINIVIL,ZESTRIL) 40 MG tablet Take 1 tablet (40 mg total) by mouth daily. 90 tablet 3  . metoprolol tartrate (LOPRESSOR) 25 MG tablet Take 1 tablet (25 mg total) by mouth 2 (two) times daily. 60 tablet 1  . nitroGLYCERIN (NITROSTAT) 0.3 MG SL tablet Place 0.3 mg under the tongue every 5 (five) minutes as needed for chest pain.    . traMADol (ULTRAM) 50 MG tablet Take 1 tablet (50 mg total) by mouth every 6 (six) hours as needed. (Patient taking differently: Take 50 mg by mouth every 6 (six) hours as needed for severe pain. ) 20 tablet 0   No current facility-administered medications for this visit.     Allergies:   Morphine and related    Social History:  The patient  reports that he quit smoking about 26 years ago.  His smoking use included cigarettes. He has a 62.50 pack-year smoking history. he has never used smokeless tobacco. He reports that he drinks alcohol. He reports that he does not use drugs.   Family History:  The patient's family history includes Colon polyps in his sister; Heart attack in his mother; Stomach cancer in his father.    ROS: All other systems are reviewed and negative. Unless otherwise mentioned in H&P    PHYSICAL EXAM: VS:  BP 126/76   Pulse (!) 58   Ht 5\' 10"  (1.778 m)   Wt 211 lb (95.7 kg)   SpO2 98%   BMI 30.28 kg/m  , BMI Body mass index is 30.28 kg/m. GEN: Well nourished, well developed, in no acute distress  HEENT: normal  Neck: no JVD, carotid bruits, or masses Cardiac: RRR; 1/6   Systolic murmurs, right sternal border, rubs, or gallops,no edema  Respiratory:  clear to auscultation bilaterally, normal work of breathing GI: soft, nontender, nondistended, + BS MS: no deformity or atrophy  Skin: warm and dry, no rash Neuro:  Strength and sensation are intact Psych: euthymic mood, full affect  Recent Labs: 09/02/2017: ALT 28; TSH 1.304 09/05/2017: Hemoglobin 15.4; Platelets 159 09/09/2017: BUN 21; Creatinine, Ser 1.09; Potassium 4.1; Sodium 141    Lipid Panel    Component Value Date/Time   CHOL 193 09/02/2017 1420   TRIG 133 09/02/2017 1420   HDL 37 (L) 09/02/2017 1420   CHOLHDL 5.2 09/02/2017 1420   VLDL 27 09/02/2017 1420   LDLCALC 129 (H) 09/02/2017 1420      Wt Readings from Last 3 Encounters:  09/16/17 211 lb (95.7 kg)  09/06/17 203 lb 8 oz (92.3 kg)  09/02/17 210 lb (95.3 kg)      Other studies Reviewed: Echocardiogram 2017-04-07 - Left ventricle: The cavity size was normal. There was moderate   concentric hypertrophy. Systolic function was normal. The   estimated ejection fraction was 60%. Wall motion was normal;   there were no regional wall motion abnormalities. Doppler   parameters are consistent with abnormal left ventricular   relaxation (grade 1 diastolic dysfunction). - Aortic valve: Mildly calcified annulus. Bicuspid. There was mild   stenosis. There was mild to moderate regurgitation. Peak velocity   (S): 215 cm/s. Mean gradient (S): 10 mm Hg. Valve area (VTI):   1.83 cm^2. Valve area (Vmax): 1.92 cm^2. Valve area (Vmean): 1.87   cm^2. - Aorta: Mild aortic root dilatation. Aortic root dimension: 40 mm   (ED).  Cardiac Catheterization 09/05/2017 Coronary Findings   Diagnostic  Dominance: Right  Left Main  No significant coronary disease.  Left Anterior Descending  No significant coronary disease.  Ramus Intermedius  Moderate ramus. No significant coronary disease.  Left Circumflex  No significant coronary disease.  Right  Coronary Artery  No significant coronary disease.  Intervention   No interventions have been documented.  Right Heart   Right Heart Pressures RHC Procedural Findings: Hemodynamics (mmHg) RA mean 5 RV 29/7 PA 30/11, mean 19 PCWP mean 13  Oxygen saturations: PA 71% AO 98%  Cardiac Output (Fick) 4.76  Cardiac Index (Fick) 2.23    ASSESSMENT AND PLAN:  1.  Bicuspid aortic valve: Recent cardiac catheterization revealing normal coronary.  He did have mild to moderate aortic valve vegetation, with mild stenosis.  He is currently on metoprolol 25 mg twice daily with good heart rate control.  He did have some mild sluggishness when starting the medication but has recovered from this.  2.  Ascending aortic aneurysm: Measurement per CT scan revealed a 5 cm fusiform aneurysm of the ascending aorta.  Semiannual imaging is recommended.  The patient is due to follow-up with CVTS for ongoing assessment.   3.  History of colon polyps: The patient is due to have a colonoscopy within the next 6 weeks.  He was advised by surgery to have this completed in case he would need to have aortic valve replacement and aneurysm repair and require anticoagulation.  Once colonoscopy is completed, they will contact surgery to allow them to follow-up with him.  4.  Hypertension: Patient is on 3 antihypertensive medications to include amlodipine, metoprolol, and lisinopril.  I reviewed his recent labs.  Creatinine 1.09.   Current medicines are reviewed at length with the patient today.  I have given him copies of his catheterization report CAT scan report and echocardiogram for his own records.  Refills have been provided on metoprolol and lisinopril.  We will see him in 6 months unless he becomes symptomatic.  Work paperwork is also filled out concerning his recent absence for catheterization.  Labs/ tests ordered today include:  Phill Myron. West Pugh, ANP, AACC   09/16/2017 2:00 PM    [facility] [address], [place], [place] [address] Phone: [phone]; Fax: [phone]

## 2017-09-16 ENCOUNTER — Encounter: Payer: Self-pay | Admitting: Adult Health

## 2017-09-16 ENCOUNTER — Ambulatory Visit (INDEPENDENT_AMBULATORY_CARE_PROVIDER_SITE_OTHER): Payer: Medicare HMO | Admitting: Adult Health

## 2017-09-16 VITALS — BP 126/76 | HR 58 | Ht 70.0 in | Wt 211.0 lb

## 2017-09-16 DIAGNOSIS — I714 Abdominal aortic aneurysm, without rupture, unspecified: Secondary | ICD-10-CM

## 2017-09-16 DIAGNOSIS — Q231 Congenital insufficiency of aortic valve: Secondary | ICD-10-CM | POA: Diagnosis not present

## 2017-09-16 DIAGNOSIS — Q2381 Bicuspid aortic valve: Secondary | ICD-10-CM

## 2017-09-16 DIAGNOSIS — I10 Essential (primary) hypertension: Secondary | ICD-10-CM

## 2017-09-16 MED ORDER — LISINOPRIL 40 MG PO TABS: 40.0000 mg | ORAL_TABLET | Freq: Every day | ORAL | 10 refills | Status: DC

## 2017-09-16 MED ORDER — METOPROLOL TARTRATE 25 MG PO TABS: 25.0000 mg | ORAL_TABLET | Freq: Two times a day (BID) | ORAL | 10 refills | Status: DC

## 2017-09-16 NOTE — Patient Instructions (Signed)
Medication Instructions:  Your physician recommends that you continue on your current medications as directed. Please refer to the Current Medication list given to you today.   Labwork: NONE   Testing/Procedures: NONE   Follow-Up: Your physician wants you to follow-up in: 6 Months. You will receive a reminder letter in the mail two months in advance. If you don't receive a letter, please call our office to schedule the follow-up appointment.   Any Other Special Instructions Will Be Listed Below (If Applicable).     If you need a refill on your cardiac medications before your next appointment, please call your pharmacy.  Thank you for choosing [facility]!   

## 2017-12-08 ENCOUNTER — Other Ambulatory Visit: Payer: Self-pay

## 2017-12-08 MED ORDER — LISINOPRIL 40 MG PO TABS: 40.0000 mg | ORAL_TABLET | Freq: Every day | ORAL | 1 refills | Status: DC

## 2017-12-08 MED ORDER — METOPROLOL TARTRATE 25 MG PO TABS: 25.0000 mg | ORAL_TABLET | Freq: Two times a day (BID) | ORAL | 1 refills | Status: DC

## 2017-12-08 MED ORDER — AMLODIPINE BESYLATE 5 MG PO TABS: 5.0000 mg | ORAL_TABLET | Freq: Every day | ORAL | 1 refills | Status: DC

## 2017-12-08 NOTE — Telephone Encounter (Signed)
escribed refills to [facility] for lisinopril, metoprolol and amlodipine

## 2017-12-09 ENCOUNTER — Other Ambulatory Visit: Payer: Self-pay

## 2017-12-09 MED ORDER — LISINOPRIL 40 MG PO TABS: 40.0000 mg | ORAL_TABLET | Freq: Every day | ORAL | 1 refills | Status: DC

## 2017-12-09 MED ORDER — AMLODIPINE BESYLATE 5 MG PO TABS: 5.0000 mg | ORAL_TABLET | Freq: Every day | ORAL | 1 refills | Status: DC

## 2017-12-09 MED ORDER — METOPROLOL TARTRATE 25 MG PO TABS: 25.0000 mg | ORAL_TABLET | Freq: Two times a day (BID) | ORAL | 1 refills | Status: DC

## 2018-01-05 ENCOUNTER — Encounter: Payer: Self-pay | Admitting: Nurse Practitioner

## 2018-01-05 ENCOUNTER — Encounter: Payer: Self-pay | Admitting: *Deleted

## 2018-01-05 ENCOUNTER — Other Ambulatory Visit: Payer: Self-pay | Admitting: *Deleted

## 2018-01-05 ENCOUNTER — Ambulatory Visit (INDEPENDENT_AMBULATORY_CARE_PROVIDER_SITE_OTHER): Payer: Medicare HMO | Admitting: Nurse Practitioner

## 2018-01-05 VITALS — BP 127/79 | HR 57 | Temp 98.5°F | Ht 70.0 in | Wt 212.4 lb

## 2018-01-05 DIAGNOSIS — Z86010 Personal history of colonic polyps: Secondary | ICD-10-CM

## 2018-01-05 DIAGNOSIS — I712 Thoracic aortic aneurysm, without rupture, unspecified: Secondary | ICD-10-CM

## 2018-01-05 NOTE — Assessment & Plan Note (Signed)
3 of colon polyps with most recent colonoscopy just over 1 year ago which noted fair prep and a single transverse polyp measuring 8 mm found to be tubular adenoma.  Recommend one year repeat exam.  The patient has a dilated a sending aorta aneurysm and is undergoing workup by cardiothoracic surgery.  The patient states he was told they want his colonoscopy done before any surgical revision of his aneurysm.  At this point we will schedule colonoscopy to follow-up on history of colon polyps.  Proceed with colonoscopy with Dr. Oneida Alar in the near future. The risks, benefits, and alternatives have been discussed in detail with the patient. They state understanding and desire to proceed.   Patient is not on any anticoagulants, anxiolytics, chronic pain medications, or antidepressants.  Occasional alcohol use, denies drug use.  Conscious sedation should be adequate for his procedure as it was for his last.

## 2018-01-05 NOTE — Progress Notes (Addendum)
REVIEWED-NO ADDITIONAL RECOMMENDATIONS.  Referring Provider: [name], MD Primary Care Physician:  [name], MD Primary GI:  Dr. Oneida Alar  Chief Complaint  Patient presents with  . Colonoscopy    consult, doing ok    HPI:   Lawrence [name] is a 67 y.o. male who presents for follow-up colonoscopy.  The patient was last seen in our office 08/01/2016 for history of colon polyps.  His previous colonoscopy at his last visit was noted to be in the [place] dated 01/21/2012 which found a tubular adenoma transverse colon polyp with recommended repeat exam every 2 years due to family history.  At his last visit he was doing well overall other than some right lower quadrant abdominal pain consistent with previous hernia symptoms.  GERD symptoms well controlled on diet.  No other GI symptoms.  Recommended repeat colonoscopy.  Follow-up based on post procedure recommendations  Colonoscopy was completed 08/29/2016 which found a fair prep, redundant colon, single 8 mm polyp in distal transverse colon, diverticulosis, nonbleeding internal hemorrhoids.  Surgical pathology found the polyp to be tubular adenoma.  Recommended continue current medications, repeat colonoscopy in 1 year.  Today he states he's doing well overall. States that for his last colonoscopy he did drink "almost all the prep" but his bowel movements were clear. Has an aortic aneurism which is pending surgery. His doctors told him postop he will be on blood thinners that will not be able to be held so they prefer he have colonoscopy prior to surgery. Has had a couple episodes of hematochezia on the stool and paper when straining; history of known hemorrhoids. Denies abdominal pain, N/V, melena, fever, chills, unintentional weight loss. GERD symptoms well controlled unless he gets off work late (11:00 pm) and eats prior to bed. Has intermittent dyspnea ("I think cause of the heart issue") but does not require oxygen; no syncope. Denies chest  pain, dizziness, lightheadedness, syncope, near syncope. Denies any other upper or lower GI symptoms.  Note from Dr. Oneida Alar recommended ASAP colonoscopy with moviprep split dose.  Past Medical History:  Diagnosis Date  . Aortic atherosclerosis ([place])   . Aortic insufficiency   . Aortic stenosis   . Arthritis   . Bicuspid aortic valve    a. mild AS, mild-mod AI by echo 03/2017, 5cm thoracic aortic aneurysm by CT 07/2017.  . Essential hypertension   . GERD (gastroesophageal reflux disease)   . Gout   . Thoracic aortic aneurysm ([place])    a. 07/2017: 5cm by CT.    Past Surgical History:  Procedure Laterality Date  . COLONOSCOPY N/A 08/29/2016   Procedure: COLONOSCOPY;  Surgeon: Danie Binder, MD;  Location: [facility];  Service: Endoscopy;  Laterality: N/A;  1030  . HERNIA REPAIR    . RIGHT/LEFT HEART CATH AND CORONARY ANGIOGRAPHY N/A 09/05/2017   Procedure: RIGHT/LEFT HEART CATH AND CORONARY ANGIOGRAPHY;  Surgeon: Larey Dresser, MD;  Location: [facility];  Service: Cardiovascular;  Laterality: N/A;    Current Outpatient Medications  Medication Sig Dispense Refill  . allopurinol (ZYLOPRIM) 300 MG tablet Take 300 mg by mouth daily as needed (gout flare).     Marland Kitchen amLODipine (NORVASC) 5 MG tablet Take 1 tablet (5 mg total) by mouth daily. 90 tablet 1  . calcium carbonate (TUMS - DOSED IN MG ELEMENTAL CALCIUM) 500 MG chewable tablet Chew 1-2 tablets by mouth daily as needed for indigestion or heartburn.    . cyclobenzaprine (FLEXERIL) 5 MG tablet  Take 5 mg by mouth at bedtime as needed for muscle spasms.     Marland Kitchen ibuprofen (ADVIL,MOTRIN) 600 MG tablet Take 600 mg by mouth 3 (three) times daily as needed for moderate pain.     Marland Kitchen lisinopril (PRINIVIL,ZESTRIL) 40 MG tablet Take 1 tablet (40 mg total) by mouth daily. 90 tablet 1  . metoprolol tartrate (LOPRESSOR) 25 MG tablet Take 1 tablet (25 mg total) by mouth 2 (two) times daily. 180 tablet 1  . nitroGLYCERIN (NITROSTAT) 0.3 MG SL  tablet Place 0.3 mg under the tongue every 5 (five) minutes as needed for chest pain.     No current facility-administered medications for this visit.     Allergies as of 01/05/2018 - Review Complete 01/05/2018  Allergen Reaction Noted  . Morphine and related Other (See Comments) 06/17/2013    Family History  Problem Relation Age of Onset  . Colon polyps Sister   . Heart attack Mother   . Stomach cancer Father   . Colon cancer Neg Hx     Social History   Socioeconomic History  . Marital status: Widowed    Spouse name: None  . Number of children: None  . Years of education: None  . Highest education level: None  Social Needs  . Financial resource strain: None  . Food insecurity - worry: None  . Food insecurity - inability: None  . Transportation needs - medical: None  . Transportation needs - non-medical: None  Occupational History  . None  Tobacco Use  . Smoking status: Former Smoker    Packs/day: 2.50    Years: 25.00    Pack years: 62.50    Types: Cigarettes    Last attempt to quit: 08/02/1991    Years since quitting: 26.4  . Smokeless tobacco: Never Used  Substance and Sexual Activity  . Alcohol use: Yes    Comment: Occasionally, with sporting events up to 4 at a time.  . Drug use: No  . Sexual activity: None  Other Topics Concern  . None  Social History Narrative  . None    Review of Systems: Complete ROS negative except as per HPI.   Physical Exam: BP 127/79   Pulse (!) 57   Temp 98.5 F (36.9 C) (Oral)   Ht 5\' 10"  (1.778 m)   Wt 212 lb 6.4 oz (96.3 kg)   BMI 30.48 kg/m  General:   Alert and oriented. Pleasant and cooperative. Well-nourished and well-developed.  Eyes:  Without icterus, sclera clear and conjunctiva pink.  Ears:  Normal auditory acuity. Cardiovascular:  S1, S2 present with 3/5 blowing systolic murmur. Extremities without clubbing or edema. Respiratory:  Clear to auscultation bilaterally. No wheezes, rales, or rhonchi. No  distress.  Gastrointestinal:  +BS, soft, non-tender and non-distended. No HSM noted. No guarding or rebound. No masses appreciated. Midline abdominal hernia notes soft, nontender, easily reducible. Rectal:  Deferred  Musculoskalatal:  Symmetrical without gross deformities. Skin:  Intact without significant lesions or rashes. Neurologic:  Alert and oriented x4;  grossly normal neurologically. Psych:  Alert and cooperative. Normal mood and affect. Heme/Lymph/Immune: No excessive bruising noted.    01/05/2018 11:49 AM   Disclaimer: This note was dictated with voice recognition software. Similar sounding words can inadvertently be transcribed and may not be corrected upon review.

## 2018-01-05 NOTE — Patient Instructions (Signed)
1. We will schedule your colonoscopy for you. 2. At this time I am not can give you the other medicine we talked about that could cause diarrhea.  Dr. Oneida Alar at previously made a recommendation for what to use. 3. Further recommendations will be made based on the results of your colonoscopy. 4. Follow-up based on the recommendations made after your colonoscopy. 5. Call if you have any questions or concerns.

## 2018-01-05 NOTE — Assessment & Plan Note (Signed)
Patient with a known ascending aortic aneurysm.  Undergoing workup by cardiovascular thoracic surgery.  Currently seeing cardiology.  The patient states he was told he should have a colonoscopy before any surgical procedure for his aneurysm.  At this point we will proceed with colonoscopy as per below.  Denies any chest pain, lightheadedness, dizziness, syncope, near syncope.  Does have intermittent shortness of breath but this is felt to be mild and he is not requiring any oxygen at this time.

## 2018-01-05 NOTE — Progress Notes (Signed)
Cc'ed to pcp °

## 2018-01-22 ENCOUNTER — Telehealth: Payer: Self-pay | Admitting: *Deleted

## 2018-01-22 ENCOUNTER — Encounter: Payer: Self-pay | Admitting: *Deleted

## 2018-01-22 NOTE — Telephone Encounter (Signed)
Patient called in stating he has some cold symptoms/slight fever last night. Called over to endo and spoke with  [facility] and she stated if he has had a fever then will need to r/s.   Called patient back and made aware. He is now r/s'd to 03/09/18 at 12:45pm. New prep instructions has been mailed to pt (confrimed mailing address). Called Hoyle Sauer and made aware to change date.

## 2018-01-30 DIAGNOSIS — L02222 Furuncle of back [any part, except buttock]: Secondary | ICD-10-CM | POA: Diagnosis not present

## 2018-02-05 DIAGNOSIS — L02232 Carbuncle of back [any part, except buttock]: Secondary | ICD-10-CM | POA: Diagnosis not present

## 2018-02-05 DIAGNOSIS — Z6829 Body mass index (BMI) 29.0-29.9, adult: Secondary | ICD-10-CM | POA: Diagnosis not present

## 2018-02-11 DIAGNOSIS — L02232 Carbuncle of back [any part, except buttock]: Secondary | ICD-10-CM | POA: Diagnosis not present

## 2018-02-11 DIAGNOSIS — Z6829 Body mass index (BMI) 29.0-29.9, adult: Secondary | ICD-10-CM | POA: Diagnosis not present

## 2018-02-11 NOTE — Progress Notes (Signed)
Cardiology Office Note  Date: 02/12/2018   ID: Lawrence [name], DOB [birth year], MRN [id number]  PCP: [name], MD  Primary Cardiologist: [name], MD   Chief Complaint  Patient presents with  . Aortic Stenosis    History of Present Illness: Lawrence [name] is a 67 y.o. male last seen by Ms. West Pugh in November 2018.  I saw him on one occasion in April 2018.  I reviewed the interval chart and testing.  He has had no further follow-up with TCTS.  He has a bicuspid aortic valve associated with mild aortic stenosis and mild to moderate aortic regurgitation by echocardiogram in May of last year.  This is associated with a 5 cm thoracic aortic aneurysm documented by chest CT in September 2018.  He is asymptomatic reporting no chest pain or shortness of breath, no palpitations or syncope.  Blood pressure is well controlled today on current medications.  He has not undergone any interval imaging studies.  I reviewed his current medications.  Antihypertensives include Norvasc, lisinopril, and Lopressor.  He continues to follow with Dr. Nevada Crane for primary care.  Past Medical History:  Diagnosis Date  . Aortic atherosclerosis ([place])   . Aortic insufficiency   . Aortic stenosis   . Arthritis   . Bicuspid aortic valve    a. mild AS, mild-mod AI by echo 03/2017, 5cm thoracic aortic aneurysm by CT 07/2017.  . Essential hypertension   . GERD (gastroesophageal reflux disease)   . Gout   . Thoracic aortic aneurysm ([place])    a. 07/2017: 5cm by CT.    Past Surgical History:  Procedure Laterality Date  . COLONOSCOPY N/A 08/29/2016   Procedure: COLONOSCOPY;  Surgeon: Danie Binder, MD;  [facility];  Service: Endoscopy;  Laterality: N/A;  1030  . HERNIA REPAIR    . RIGHT/LEFT HEART CATH AND CORONARY ANGIOGRAPHY N/A 09/05/2017   Procedure: RIGHT/LEFT HEART CATH AND CORONARY ANGIOGRAPHY;  Surgeon: Larey Dresser, MD;  Location: [place] [facility];  Service: Cardiovascular;   Laterality: N/A;    Current Outpatient Medications  Medication Sig Dispense Refill  . allopurinol (ZYLOPRIM) 300 MG tablet Take 300 mg by mouth daily as needed (gout flare).     Marland Kitchen amLODipine (NORVASC) 5 MG tablet Take 1 tablet (5 mg total) by mouth daily. 90 tablet 1  . calcium carbonate (TUMS - DOSED IN MG ELEMENTAL CALCIUM) 500 MG chewable tablet Chew 1-2 tablets by mouth daily as needed for indigestion or heartburn.    . cyclobenzaprine (FLEXERIL) 5 MG tablet Take 5 mg by mouth 3 (three) times daily as needed for muscle spasms.     Marland Kitchen ibuprofen (ADVIL,MOTRIN) 600 MG tablet Take 600 mg by mouth 3 (three) times daily as needed for moderate pain.     Marland Kitchen lisinopril (PRINIVIL,ZESTRIL) 40 MG tablet Take 1 tablet (40 mg total) by mouth daily. 90 tablet 1  . metoprolol tartrate (LOPRESSOR) 25 MG tablet Take 1 tablet (25 mg total) by mouth 2 (two) times daily. 180 tablet 1  . nitroGLYCERIN (NITROSTAT) 0.3 MG SL tablet Place 0.3 mg under the tongue every 5 (five) minutes as needed for chest pain.     No current facility-administered medications for this visit.    Allergies:  Morphine and related   Social History: The patient  reports that he quit smoking about 26 years ago. His smoking use included cigarettes. He has a 62.50 pack-year smoking history. He has never used  smokeless tobacco. He reports that he drinks alcohol. He reports that he does not use drugs.   ROS:  Please see the history of present illness. Otherwise, complete review of systems is positive for cyst on his back.  All other systems are reviewed and negative.   Physical Exam: VS:  BP 116/72   Pulse 61   Ht 5\' 10"  (1.778 m)   Wt 210 lb (95.3 kg)   SpO2 95%   BMI 30.13 kg/m , BMI Body mass index is 30.13 kg/m.  Wt Readings from Last 3 Encounters:  02/12/18 210 lb (95.3 kg)  01/05/18 212 lb 6.4 oz (96.3 kg)  09/16/17 211 lb (95.7 kg)    General: Patient appears comfortable at rest. HEENT: Conjunctiva and lids normal,  oropharynx clear. Neck: Supple, no elevated JVP or carotid bruits, no thyromegaly. Lungs: Clear to auscultation, nonlabored breathing at rest. Cardiac: Regular rate and rhythm, no S3, 3/6 systolic murmur, no pericardial rub. Abdomen: Soft, nontender, bowel sounds present. Extremities: No pitting edema, distal pulses 2+.  Skin: Warm and dry. Musculoskeletal: No kyphosis. Neuropsychiatric: Alert and oriented x3, affect grossly appropriate.  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  Recent Labwork: 09/02/2017: ALT 28; AST 22; TSH 1.304 09/05/2017: Hemoglobin 15.4; Platelets 159 09/09/2017: BUN 21; Creatinine, Ser 1.09; Potassium 4.1; Sodium 141     Component Value Date/Time   CHOL 193 09/02/2017 1420   TRIG 133 09/02/2017 1420   HDL 37 (L) 09/02/2017 1420   CHOLHDL 5.2 09/02/2017 1420   VLDL 27 09/02/2017 1420   LDLCALC 129 (H) 09/02/2017 1420    Other Studies Reviewed Today:  Chest and abdominal CTA 08/04/2017: IMPRESSION: CTA CHEST:  1. 5 cm fusiform aneurysm ascending aorta. Recommend semi-annual imaging followup by CTA or MRA and referral to cardiothoracic surgery if not already obtained. This recommendation follows 2010 ACCF/AHA/AATS/ACR/ASA/SCA/SCAI/SIR/STS/SVM Guidelines for the Diagnosis and Management of Patients With Thoracic Aortic Disease. Circulation. 2010; 121: I967-E938. 2. Mild emphysema.  Bibasilar atelectasis/scarring.  CTA ABDOMEN AND PELVIS:  1. No acute vascular process or or acute intra-abdominal/pelvic disease. 2. Mild colonic diverticulosis. Aortic Atherosclerosis (ICD10-I70.0), Emphysema (ICD10-J43.9), Aortic aneurysm NOS (ICD10-I71.9).  Left and right heart catheterization 09/05/2017: 1. Normal right and left heart filling pressures and preserved cardiac output.  2. Normal coronaries.   Echocardiogram 03/13/2017: Study Conclusions  - Left ventricle: The  cavity size was normal. There was moderate   concentric hypertrophy. Systolic function was normal. The   estimated ejection fraction was 60%. Wall motion was normal;   there were no regional wall motion abnormalities. Doppler   parameters are consistent with abnormal left ventricular   relaxation (grade 1 diastolic dysfunction). - Aortic valve: Mildly calcified annulus. Bicuspid. There was mild   stenosis. There was mild to moderate regurgitation. Peak velocity   (S): 215 cm/s. Mean gradient (S): 10 mm Hg. Valve area (VTI):   1.83 cm^2. Valve area (Vmax): 1.92 cm^2. Valve area (Vmean): 1.87   cm^2. - Aorta: Mild aortic root dilatation. Aortic root dimension: 40 mm   (ED).  Assessment and Plan:  1.  Bicuspid aortic valve with mild aortic stenosis and mild to moderate aortic regurgitation by echocardiogram in May 2018.  Follow-up study will be obtained.  2.  Thoracic aortic aneurysm measuring 5 cm by chest CTA in September 2018.  He is asymptomatic at this time.  Blood pressure is well controlled today on current regimen.  Follow-up chest CTA will  be obtained for surveillance.  She was seen by Dr. Servando Snare in consultation in October 2018.  3.  Normal coronary arteries at cardiac catheterization in October 2018.  4.  Essential hypertension, blood pressure is well controlled today.  No changes made to present regimen.  Current medicines were reviewed with the patient today.   Orders Placed This Encounter  Procedures  . CT ANGIO CHEST AORTA W &/OR WO CONTRAST  . Basic Metabolic Panel (BMET)  . ECHOCARDIOGRAM COMPLETE    Disposition: Follow up in 6 months.  Signed, Satira Sark, MD, [facility] 02/12/2018 2:02 PM    [facility] [address] [place], [place] [address] Phone: [phone]; Fax: [phone]

## 2018-02-12 ENCOUNTER — Ambulatory Visit (INDEPENDENT_AMBULATORY_CARE_PROVIDER_SITE_OTHER): Payer: Medicare HMO | Admitting: Cardiology

## 2018-02-12 ENCOUNTER — Other Ambulatory Visit (HOSPITAL_COMMUNITY)
Admission: RE | Admit: 2018-02-12 | Discharge: 2018-02-12 | Disposition: A | Discharge status: Home / Self Care | Payer: Medicare HMO | Source: Ambulatory Visit | Attending: Cardiology | Admitting: Cardiology

## 2018-02-12 ENCOUNTER — Encounter: Payer: Self-pay | Admitting: Cardiology

## 2018-02-12 VITALS — BP 116/72 | HR 61 | Ht 70.0 in | Wt 210.0 lb

## 2018-02-12 DIAGNOSIS — Q231 Congenital insufficiency of aortic valve: Secondary | ICD-10-CM | POA: Insufficient documentation

## 2018-02-12 DIAGNOSIS — I712 Thoracic aortic aneurysm, without rupture, unspecified: Secondary | ICD-10-CM

## 2018-02-12 DIAGNOSIS — I350 Nonrheumatic aortic (valve) stenosis: Secondary | ICD-10-CM

## 2018-02-12 DIAGNOSIS — I10 Essential (primary) hypertension: Secondary | ICD-10-CM

## 2018-02-12 LAB — BASIC METABOLIC PANEL
Anion gap: 10 (ref 5–15)
BUN: 22 mg/dL — ABNORMAL HIGH (ref 6–20)
CHLORIDE: 104 mmol/L (ref 101–111)
CO2: 24 mmol/L (ref 22–32)
Calcium: 9.6 mg/dL (ref 8.9–10.3)
Creatinine, Ser: 1.34 mg/dL — ABNORMAL HIGH (ref 0.61–1.24)
GFR calc non Af Amer: 54 mL/min — ABNORMAL LOW (ref >60)
Glucose, Bld: 92 mg/dL (ref 65–99)
Potassium: 4.8 mmol/L (ref 3.5–5.1)
Sodium: 138 mmol/L (ref 135–145)

## 2018-02-12 NOTE — Patient Instructions (Signed)
Your physician wants you to follow-up in: 6 months with Dr.McDowell You will receive a reminder letter in the mail two months in advance. If you don't receive a letter, please call our office to schedule the follow-up appointment.   Your physician has requested that you have an echocardiogram. Echocardiography is a painless test that uses sound waves to create images of your heart. It provides your doctor with information about the size and shape of your heart and how well your heart's chambers and valves are working. This procedure takes approximately one hour. There are no restrictions for this procedure.   Non-Cardiac CT Angiography (CTA), is a special type of CT scan that uses a computer to produce multi-dimensional views of major blood vessels throughout the body. In CT angiography, a contrast material is injected through an IV to help visualize the blood vessels     Get lab work BMET for chest CT    Your physician recommends that you continue on your current medications as directed. Please refer to the Current Medication list given to you today.    If you need a refill on your cardiac medications before your next appointment, please call your pharmacy.        Thank you for choosing [facility] !

## 2018-02-19 ENCOUNTER — Encounter: Payer: Self-pay | Admitting: General Surgery

## 2018-02-19 ENCOUNTER — Ambulatory Visit (INDEPENDENT_AMBULATORY_CARE_PROVIDER_SITE_OTHER): Payer: Medicare HMO | Admitting: General Surgery

## 2018-02-19 VITALS — BP 134/87 | HR 79 | Temp 98.6°F | Resp 18 | Ht 70.0 in | Wt 206.0 lb

## 2018-02-19 DIAGNOSIS — L723 Sebaceous cyst: Secondary | ICD-10-CM | POA: Diagnosis not present

## 2018-02-19 MED ORDER — SULFAMETHOXAZOLE-TRIMETHOPRIM 800-160 MG PO TABS: 1.0000 | ORAL_TABLET | Freq: Two times a day (BID) | ORAL | 0 refills | Status: AC

## 2018-02-19 NOTE — Progress Notes (Signed)
[facility] History and Physical  Reason for Referral: Sebaceous Cyst on Back, Recently Infected  Referring Physician:  Dr. Nevada Crane   Chief Complaint    Cyst      Lawrence [name] is a 67 y.o. male.  HPI: Lawrence [name] is a very pleasant 67 yo who reports an area on his back that has been enlarged and infected over the last 3 weeks. He has been to Dr. Nevada Crane who drained the area, but he also reports having the same issues last year about this time.  He has a recent history of a MI and findings of aortic aneurysm per his report. Dr. Myles Gip documentation also indicates bicuspid aortic valve with associated stenosis/ regurgiatation.  He is being followed by Dr. Domenic Polite and has reported no recent chest pain or shortness of breath. He recently saw Dr. Domenic Polite and is scheduled for testing 02/24/2018 to assess his aorta. He has been seen by Dr. Servando Snare in the past, and is being followed for potential need for surgery if aneurysm continue to grow.  He has completed a course of antibiotics for the infected cyst, and reports that it is less swollen but that it continues to drain. He works at [employer] as a Tourist information centre manager.    Past Medical History:  Diagnosis Date  . Aortic atherosclerosis ([place])   . Aortic insufficiency   . Aortic stenosis   . Arthritis   . Bicuspid aortic valve    a. mild AS, mild-mod AI by echo 03/2017, 5cm thoracic aortic aneurysm by CT 07/2017.  . Essential hypertension   . GERD (gastroesophageal reflux disease)   . Gout   . Thoracic aortic aneurysm ([place])    a. 07/2017: 5cm by CT.    Past Surgical History:  Procedure Laterality Date  . COLONOSCOPY N/A 08/29/2016   Procedure: COLONOSCOPY;  Surgeon: Danie Binder, MD;  [facility];  Service: Endoscopy;  Laterality: N/A;  1030  . HERNIA REPAIR    . RIGHT/LEFT HEART CATH AND CORONARY ANGIOGRAPHY N/A 09/05/2017   Procedure: RIGHT/LEFT HEART CATH AND CORONARY ANGIOGRAPHY;  Surgeon: Larey Dresser, MD;   Location: [facility];  Service: Cardiovascular;  Laterality: N/A;    Family History  Problem Relation Age of Onset  . Colon polyps Sister   . Heart attack Mother   . Stomach cancer Father   . Colon cancer Neg Hx     Social History   Tobacco Use  . Smoking status: Former Smoker    Packs/day: 2.50    Years: 25.00    Pack years: 62.50    Types: Cigarettes    Last attempt to quit: 08/02/1991    Years since quitting: 26.5  . Smokeless tobacco: Never Used  Substance Use Topics  . Alcohol use: Yes    Comment: Occasionally, with sporting events up to 4 at a time.  . Drug use: No    Medications: I have reviewed the patient's current medications. Allergies as of 02/19/2018      Reactions   Morphine And Related Other (See Comments)   Caused patient to pass out.       Medication List        Accurate as of 02/19/18  2:29 PM. Always use your most recent med list.          allopurinol 300 MG tablet Commonly known as:  ZYLOPRIM Take 300 mg by mouth daily as needed (gout flare).   amLODipine 5 MG tablet Commonly known as:  NORVASC Take 1 tablet (5 mg total) by mouth daily.   calcium carbonate 500 MG chewable tablet Commonly known as:  TUMS - dosed in mg elemental calcium Chew 1-2 tablets by mouth daily as needed for indigestion or heartburn.   cyclobenzaprine 5 MG tablet Commonly known as:  FLEXERIL Take 5 mg by mouth 3 (three) times daily as needed for muscle spasms.   ibuprofen 600 MG tablet Commonly known as:  ADVIL,MOTRIN Take 600 mg by mouth 3 (three) times daily as needed for moderate pain.   lisinopril 40 MG tablet Commonly known as:  PRINIVIL,ZESTRIL Take 1 tablet (40 mg total) by mouth daily.   metoprolol tartrate 25 MG tablet Commonly known as:  LOPRESSOR Take 1 tablet (25 mg total) by mouth 2 (two) times daily.   nitroGLYCERIN 0.3 MG SL tablet Commonly known as:  NITROSTAT Place 0.3 mg under the tongue every 5 (five) minutes as needed for chest  pain.        ROS:  A comprehensive review of systems was negative except for: draining cyst on back  Blood pressure 134/87, pulse 79, temperature 98.6 F (37 C), resp. rate 18, height 5\' 10"  (1.778 m), weight 206 lb (93.4 kg). Physical Exam  Constitutional: He is oriented to person, place, and time. He appears well-developed.  HENT:  Head: Normocephalic.  Eyes: Pupils are equal, round, and reactive to light.  Neck: Normal range of motion.  Cardiovascular: Normal rate.  Pulmonary/Chest: Effort normal.  Right of spine in mid back, 2cm fluctuant area with draining tracks, erythema, freckles/ moles over entire back   Abdominal: Soft. He exhibits no distension. There is no tenderness. No hernia.  No hernia defect, healed umbilical hernia incision, some bulging/ diastasis to the right of midline but no hernia appreciated   Musculoskeletal: Normal range of motion.  Neurological: He is alert and oriented to person, place, and time.  Skin: Skin is warm and dry.  Psychiatric: He has a normal mood and affect. His behavior is normal. Judgment and thought content normal.  Vitals reviewed.   Results: 09/05/17 Cardiac Catheterization  Conclusion   1. Normal right and left heart filling pressures and preserved cardiac output.  2. Normal coronaries.     07/2017  IMPRESSION: CTA CHEST:  1. 5 cm fusiform aneurysm ascending aorta. Recommend semi-annual imaging followup by CTA or MRA and referral to cardiothoracic surgery if not already obtained. This recommendation follows 2010 ACCF/AHA/AATS/ACR/ASA/SCA/SCAI/SIR/STS/SVM Guidelines for the Diagnosis and Management of Patients With Thoracic Aortic Disease. Circulation. 2010; 121: E952-W413. 2. Mild emphysema.  Bibasilar atelectasis/scarring.  CTA ABDOMEN AND PELVIS:  1. No acute vascular process or or acute intra-abdominal/pelvic disease. 2. Mild colonic diverticulosis. Aortic Atherosclerosis (ICD10-I70.0), Emphysema  (ICD10-J43.9), Aortic aneurysm NOS (ICD10-I71.9).  Assessment & Plan:  Lawrence [name] is a 67 y.o. male with a sebaceous/ epidermal cyst on his back that has been infected and drained by Dr. Nevada Crane. He has had a similar episode last year.  -Will plan for excision of the cyst in the upcoming weeks after his repeat imaging with Dr. Domenic Polite is complete (after 4/16) -Will send Dr. Domenic Polite a note to see if he has any concerns for local/ sedation which should be sufficient for the surgery -Bactrim DS refilled due to erythema around the draining area  -Patient will call with date for surgery   All questions were answered to the satisfaction of the patient.  The risk and benefits of excision of cyst were discussed including but not limited to  bleeding, infection, risk of recurrence, risk of being something other than cyst such as cancer.  After careful consideration, Lawrence [name] has decided to proceed.    [name] 02/19/2018, 2:29 PM

## 2018-02-19 NOTE — Patient Instructions (Addendum)
Call to schedule your surgery date. We do surgeries on Mondays, Wednesdays, and Fridays. Schedule 02/27/2018 or after to give Dr. Domenic Polite enough time to review the studies on your heart.   Epidermal (Sebaceous) Cyst An epidermal cyst is sometimes called an epidermal inclusion cyst or an infundibular cyst. It is a sac made of skin tissue. The sac contains a substance called keratin. Keratin is a protein that is normally secreted through the hair follicles. When keratin becomes trapped in the top layer of skin (epidermis), it can form an epidermal cyst. Epidermal cysts are usually found on the face, neck, trunk, and genitals. These cysts are usually harmless (benign), and they may not cause symptoms unless they become infected. It is important not to pop epidermal cysts yourself. What are the causes? This condition may be caused by:  A blocked hair follicle.  A hair that curls and re-enters the skin instead of growing straight out of the skin (ingrown hair).  A blocked pore.  Irritated skin.  An injury to the skin.  Certain conditions that are passed along from parent to child (inherited).  Human papillomavirus (HPV).  What increases the risk? The following factors may make you more likely to develop an epidermal cyst:  Having acne.  Being overweight.  Wearing tight clothing.  What are the signs or symptoms? The only symptom of this condition may be a small, painless lump underneath the skin. When an epidermal cyst becomes infected, symptoms may include:  Redness.  Inflammation.  Tenderness.  Warmth.  Fever.  Keratin draining from the cyst. Keratin may look like a grayish-white, bad-smelling substance.  Pus draining from the cyst.  How is this diagnosed? This condition is diagnosed with a physical exam. In some cases, you may have a sample of tissue (biopsy) taken from your cyst to be examined under a microscope or tested for bacteria. You may be referred to a health  care provider who specializes in skin care (dermatologist). How is this treated? In many cases, epidermal cysts go away on their own without treatment. If a cyst becomes infected, treatment may include:  Opening and draining the cyst. After draining, minor surgery to remove the rest of the cyst may be done.  Antibiotic medicine to help prevent infection.  Injections of medicines (steroids) that help to reduce inflammation.  Surgery to remove the cyst. Surgery may be done if: ? The cyst becomes large. ? The cyst bothers you. ? There is a chance that the cyst could turn into cancer.  Follow these instructions at home:  Take over-the-counter and prescription medicines only as told by your health care provider.  If you were prescribed an antibiotic, use it as told by your health care provider. Do not stop using the antibiotic even if you start to feel better.  Keep the area around your cyst clean and dry.  Wear loose, dry clothing.  Do not try to pop your cyst.  Avoid touching your cyst.  Check your cyst every day for signs of infection.  Keep all follow-up visits as told by your health care provider. This is important. How is this prevented?  Wear clean, dry, clothing.  Avoid wearing tight clothing.  Keep your skin clean and dry. Shower or take baths every day.  Wash your body with a benzoyl peroxide wash when you shower or bathe. Contact a health care provider if:  Your cyst develops symptoms of infection.  Your condition is not improving or is getting worse.  You develop  a cyst that looks different from other cysts you have had.  You have a fever. Get help right away if:  Redness spreads from the cyst into the surrounding area. This information is not intended to replace advice given to you by your health care provider. Make sure you discuss any questions you have with your health care provider. Document Released: 09/28/2004 Document Revised: 06/26/2016 Document  Reviewed: 08/30/2015 Elsevier Interactive Patient Education  [employer].

## 2018-02-24 ENCOUNTER — Ambulatory Visit (HOSPITAL_COMMUNITY)
Admission: RE | Admit: 2018-02-24 | Discharge: 2018-02-24 | Disposition: A | Discharge status: Home / Self Care | Payer: Medicare HMO | Source: Ambulatory Visit | Attending: Cardiology | Admitting: Cardiology

## 2018-02-24 DIAGNOSIS — I351 Nonrheumatic aortic (valve) insufficiency: Secondary | ICD-10-CM | POA: Insufficient documentation

## 2018-02-24 DIAGNOSIS — I712 Thoracic aortic aneurysm, without rupture, unspecified: Secondary | ICD-10-CM

## 2018-02-24 DIAGNOSIS — I350 Nonrheumatic aortic (valve) stenosis: Secondary | ICD-10-CM | POA: Diagnosis not present

## 2018-02-24 DIAGNOSIS — Z87891 Personal history of nicotine dependence: Secondary | ICD-10-CM | POA: Insufficient documentation

## 2018-02-24 DIAGNOSIS — I10 Essential (primary) hypertension: Secondary | ICD-10-CM | POA: Diagnosis not present

## 2018-02-24 MED ORDER — IOPAMIDOL (ISOVUE-370) INJECTION 76%
100.0000 mL | Freq: Once | INTRAVENOUS | Status: AC | PRN
  Administered 2018-02-24: 100 mL via INTRAVENOUS

## 2018-02-24 NOTE — Progress Notes (Signed)
*  PRELIMINARY RESULTS* Echocardiogram 2D Echocardiogram has been performed.  Leavy Cella 02/24/2018, 11:48 AM

## 2018-02-26 ENCOUNTER — Telehealth: Payer: Self-pay | Admitting: Cardiology

## 2018-02-26 NOTE — Telephone Encounter (Signed)
Attempted to reach,LM to call back with name of surgeon so we can investigate

## 2018-02-26 NOTE — Telephone Encounter (Signed)
Pt is needing clearance for his back surgery next week, please give him a call

## 2018-03-03 ENCOUNTER — Telehealth: Payer: Self-pay | Admitting: General Surgery

## 2018-03-03 NOTE — Telephone Encounter (Signed)
-----   Message from Satira Sark, MD sent at 03/03/2018 10:56 AM EDT ----- Regarding: RE: Excision of sebaceous cyst with local/ sedation  Yes, I am sorry for not getting back to you sooner.  Findings were stable based on his CT and echocardiogram, so he should be able to proceed at this time.  ----- Message ----- From: [name], MD Sent: 03/03/2018   9:57 AM To: Unk Pinto, MD Subject: RE: Excision of sebaceous cyst with local/ s#  Looks like his CTA and ECHO are stable. Are we good to proceed with sebaceous cyst removal from back?  Ria Comment   ----- Message ----- From: Satira Sark, MD Sent: 02/19/2018   6:40 PM To: [name], MD Subject: RE: Excision of sebaceous cyst with local/ s#  I will follow-up on his studies, but unless there has been a substantial change that would push him for urgent TCTS evaluation, I expect he should be able to get the cyst excised.  ----- Message ----- From: [name], MD Sent: 02/19/2018   3:05 PM To: Unk Pinto, MD Subject: Excision of sebaceous cyst with local/ sedat#  Mr. Panuco has an infected cyst on his back. Needs excision in upcoming weeks. Wanted to verify no concerns from your stand point. Waiting for scheduling until after his studies on 02/24/18. He saw you recently in clinic.   Thanks Ria Comment

## 2018-03-06 DIAGNOSIS — L723 Sebaceous cyst: Secondary | ICD-10-CM

## 2018-03-06 NOTE — H&P (Signed)
[facility] History and Physical  Reason for Referral: Sebaceous Cyst on Back, Recently Infected  Referring Physician:  Dr. Nevada Crane      Chief Complaint    Cyst      Lawrence [name] is a 67 y.o. male.  HPI: Lawrence [name] is a very pleasant 67 yo who reports an area on his back that has been enlarged and infected over the last 3 weeks. He has been to Dr. Nevada Crane who drained the area, but he also reports having the same issues last year about this time.  He has a recent history of a MI and findings of aortic aneurysm per his report. Dr. Myles Gip documentation also indicates bicuspid aortic valve with associated stenosis/ regurgiatation.  He is being followed by Dr. Domenic Polite and has reported no recent chest pain or shortness of breath. He recently saw Dr. Domenic Polite and is scheduled for testing 02/24/2018 to assess his aorta. He has been seen by Dr. Servando Snare in the past, and is being followed for potential need for surgery if aneurysm continue to grow.  He has completed a course of antibiotics for the infected cyst, and reports that it is less swollen but that it continues to drain. He works at [employer] as a Tourist information centre manager.        Past Medical History:  Diagnosis Date  . Aortic atherosclerosis ([place])   . Aortic insufficiency   . Aortic stenosis   . Arthritis   . Bicuspid aortic valve    a. mild AS, mild-mod AI by echo 03/2017, 5cm thoracic aortic aneurysm by CT 07/2017.  . Essential hypertension   . GERD (gastroesophageal reflux disease)   . Gout   . Thoracic aortic aneurysm ([place])    a. 07/2017: 5cm by CT.    Past Surgical History:  Procedure Laterality Date  . COLONOSCOPY N/A 08/29/2016   Procedure: COLONOSCOPY;  Surgeon: Danie Binder, MD;  Location: [facility];  Service: Endoscopy;  Laterality: N/A;  1030  . HERNIA REPAIR    . RIGHT/LEFT HEART CATH AND CORONARY ANGIOGRAPHY N/A 09/05/2017   Procedure: RIGHT/LEFT HEART CATH AND CORONARY ANGIOGRAPHY;   Surgeon: Larey Dresser, MD;  Location: [facility];  Service: Cardiovascular;  Laterality: N/A;         Family History  Problem Relation Age of Onset  . Colon polyps Sister   . Heart attack Mother   . Stomach cancer Father   . Colon cancer Neg Hx     Social History        Tobacco Use  . Smoking status: Former Smoker    Packs/day: 2.50    Years: 25.00    Pack years: 62.50    Types: Cigarettes    Last attempt to quit: 08/02/1991    Years since quitting: 26.5  . Smokeless tobacco: Never Used  Substance Use Topics  . Alcohol use: Yes    Comment: Occasionally, with sporting events up to 4 at a time.  . Drug use: No    Medications: I have reviewed the patient's current medications.      Allergies as of 02/19/2018      Reactions   Morphine And Related Other (See Comments)   Caused patient to pass out.                Medication List            Accurate as of 02/19/18  2:29 PM. Always use your most recent med list.  allopurinol 300 MG tablet Commonly known as:  ZYLOPRIM Take 300 mg by mouth daily as needed (gout flare).   amLODipine 5 MG tablet Commonly known as:  NORVASC Take 1 tablet (5 mg total) by mouth daily.   calcium carbonate 500 MG chewable tablet Commonly known as:  TUMS - dosed in mg elemental calcium Chew 1-2 tablets by mouth daily as needed for indigestion or heartburn.   cyclobenzaprine 5 MG tablet Commonly known as:  FLEXERIL Take 5 mg by mouth 3 (three) times daily as needed for muscle spasms.   ibuprofen 600 MG tablet Commonly known as:  ADVIL,MOTRIN Take 600 mg by mouth 3 (three) times daily as needed for moderate pain.   lisinopril 40 MG tablet Commonly known as:  PRINIVIL,ZESTRIL Take 1 tablet (40 mg total) by mouth daily.   metoprolol tartrate 25 MG tablet Commonly known as:  LOPRESSOR Take 1 tablet (25 mg total) by mouth 2 (two) times daily.   nitroGLYCERIN 0.3 MG SL  tablet Commonly known as:  NITROSTAT Place 0.3 mg under the tongue every 5 (five) minutes as needed for chest pain.        ROS:  A comprehensive review of systems was negative except for: draining cyst on back  Blood pressure 134/87, pulse 79, temperature 98.6 F (37 C), resp. rate 18, height 5\' 10"  (1.778 m), weight 206 lb (93.4 kg). Physical Exam  Constitutional: He is oriented to person, place, and time. He appears well-developed.  HENT:  Head: Normocephalic.  Eyes: Pupils are equal, round, and reactive to light.  Neck: Normal range of motion.  Cardiovascular: Normal rate.  Pulmonary/Chest: Effort normal.  Right of spine in mid back, 2cm fluctuant area with draining tracks, erythema, freckles/ moles over entire back   Abdominal: Soft. He exhibits no distension. There is no tenderness. No hernia.  No hernia defect, healed umbilical hernia incision, some bulging/ diastasis to the right of midline but no hernia appreciated   Musculoskeletal: Normal range of motion.  Neurological: He is alert and oriented to person, place, and time.  Skin: Skin is warm and dry.  Psychiatric: He has a normal mood and affect. His behavior is normal. Judgment and thought content normal.  Vitals reviewed.   Results: 09/05/17 Cardiac Catheterization  Conclusion   1. Normal right and left heart filling pressures and preserved cardiac output.  2. Normal coronaries.     07/2017  IMPRESSION: CTA CHEST:  1. 5 cm fusiform aneurysm ascending aorta. Recommend semi-annual imaging followup by CTA or MRA and referral to cardiothoracic surgery if not already obtained. This recommendation follows 2010 ACCF/AHA/AATS/ACR/ASA/SCA/SCAI/SIR/STS/SVM Guidelines for the Diagnosis and Management of Patients With Thoracic Aortic Disease. Circulation. 2010; 121: H829-H371. 2. Mild emphysema. Bibasilar atelectasis/scarring.  CTA ABDOMEN AND PELVIS:  1. No acute vascular process or or acute  intra-abdominal/pelvic disease. 2. Mild colonic diverticulosis. Aortic Atherosclerosis (ICD10-I70.0), Emphysema (ICD10-J43.9), Aortic aneurysm NOS (ICD10-I71.9).  Assessment & Plan:  Lawrence [name] is a 67 y.o. male with a sebaceous/ epidermal cyst on his back that has been infected and drained by Dr. Nevada Crane. He has had a similar episode last year.  -Will plan for excision of the cyst in the upcoming weeks after his repeat imaging with Dr. Domenic Polite is complete (after 4/16) -Will send Dr. Domenic Polite a note to see if he has any concerns for local/ sedation which should be sufficient for the surgery -Bactrim DS refilled due to erythema around the draining area  -Patient will call with date for surgery  All questions were answered to the satisfaction of the patient.  The risk and benefits of excision of cyst were discussed including but not limited to bleeding, infection, risk of recurrence, risk of being something other than cyst such as cancer.  After careful consideration, Lawrence [name] has decided to proceed.    [name] 02/19/2018, 2:29 PM   Addendum Cardiology says everything stable. Ok to proceed. See telephone note.

## 2018-03-09 ENCOUNTER — Ambulatory Visit (HOSPITAL_COMMUNITY)
Admission: RE | Admit: 2018-03-09 | Discharge: 2018-03-09 | Disposition: A | Discharge status: Home / Self Care | Payer: Medicare HMO | Source: Ambulatory Visit | Attending: Gastroenterology | Admitting: Gastroenterology

## 2018-03-09 ENCOUNTER — Other Ambulatory Visit: Payer: Self-pay

## 2018-03-09 ENCOUNTER — Encounter (HOSPITAL_COMMUNITY)
Admission: RE | Disposition: A | Discharge status: Home / Self Care | Payer: Self-pay | Source: Ambulatory Visit | Attending: Gastroenterology

## 2018-03-09 ENCOUNTER — Encounter (HOSPITAL_COMMUNITY): Payer: Self-pay | Admitting: Emergency Medicine

## 2018-03-09 DIAGNOSIS — K648 Other hemorrhoids: Secondary | ICD-10-CM | POA: Insufficient documentation

## 2018-03-09 DIAGNOSIS — D122 Benign neoplasm of ascending colon: Secondary | ICD-10-CM | POA: Insufficient documentation

## 2018-03-09 DIAGNOSIS — M1990 Unspecified osteoarthritis, unspecified site: Secondary | ICD-10-CM | POA: Insufficient documentation

## 2018-03-09 DIAGNOSIS — I10 Essential (primary) hypertension: Secondary | ICD-10-CM | POA: Diagnosis not present

## 2018-03-09 DIAGNOSIS — Z79899 Other long term (current) drug therapy: Secondary | ICD-10-CM | POA: Diagnosis not present

## 2018-03-09 DIAGNOSIS — D123 Benign neoplasm of transverse colon: Secondary | ICD-10-CM | POA: Insufficient documentation

## 2018-03-09 DIAGNOSIS — Z8371 Family history of colonic polyps: Secondary | ICD-10-CM | POA: Insufficient documentation

## 2018-03-09 DIAGNOSIS — D124 Benign neoplasm of descending colon: Secondary | ICD-10-CM | POA: Diagnosis not present

## 2018-03-09 DIAGNOSIS — K635 Polyp of colon: Secondary | ICD-10-CM | POA: Insufficient documentation

## 2018-03-09 DIAGNOSIS — Z87891 Personal history of nicotine dependence: Secondary | ICD-10-CM | POA: Diagnosis not present

## 2018-03-09 DIAGNOSIS — Z1211 Encounter for screening for malignant neoplasm of colon: Secondary | ICD-10-CM | POA: Insufficient documentation

## 2018-03-09 DIAGNOSIS — D125 Benign neoplasm of sigmoid colon: Secondary | ICD-10-CM | POA: Diagnosis not present

## 2018-03-09 DIAGNOSIS — Z885 Allergy status to narcotic agent status: Secondary | ICD-10-CM | POA: Diagnosis not present

## 2018-03-09 DIAGNOSIS — M109 Gout, unspecified: Secondary | ICD-10-CM | POA: Insufficient documentation

## 2018-03-09 DIAGNOSIS — Q438 Other specified congenital malformations of intestine: Secondary | ICD-10-CM | POA: Diagnosis not present

## 2018-03-09 DIAGNOSIS — Z86010 Personal history of colonic polyps: Secondary | ICD-10-CM | POA: Diagnosis not present

## 2018-03-09 DIAGNOSIS — K219 Gastro-esophageal reflux disease without esophagitis: Secondary | ICD-10-CM | POA: Diagnosis not present

## 2018-03-09 DIAGNOSIS — K5730 Diverticulosis of large intestine without perforation or abscess without bleeding: Secondary | ICD-10-CM | POA: Diagnosis not present

## 2018-03-09 DIAGNOSIS — K644 Residual hemorrhoidal skin tags: Secondary | ICD-10-CM | POA: Insufficient documentation

## 2018-03-09 HISTORY — PX: POLYPECTOMY: SHX5525

## 2018-03-09 HISTORY — PX: COLONOSCOPY: SHX5424

## 2018-03-09 SURGERY — COLONOSCOPY
Anesthesia: Moderate Sedation

## 2018-03-09 MED ORDER — SODIUM CHLORIDE 0.9% FLUSH
INTRAVENOUS | Status: AC
  Filled 2018-03-09: qty 10

## 2018-03-09 MED ORDER — MIDAZOLAM HCL 5 MG/5ML IJ SOLN
INTRAMUSCULAR | Status: AC
  Filled 2018-03-09: qty 10

## 2018-03-09 MED ORDER — MIDAZOLAM HCL 5 MG/5ML IJ SOLN
INTRAMUSCULAR | Status: DC | PRN
  Administered 2018-03-09: 1 mg via INTRAVENOUS
  Administered 2018-03-09 (×2): 2 mg via INTRAVENOUS

## 2018-03-09 MED ORDER — MEPERIDINE HCL 100 MG/ML IJ SOLN
INTRAMUSCULAR | Status: DC | PRN
  Administered 2018-03-09 (×2): 25 mg via INTRAVENOUS

## 2018-03-09 MED ORDER — SODIUM CHLORIDE 0.9 % IV SOLN
INTRAVENOUS | Status: DC
  Administered 2018-03-09: 12:00:00 via INTRAVENOUS

## 2018-03-09 MED ORDER — PROMETHAZINE HCL 25 MG/ML IJ SOLN
INTRAMUSCULAR | Status: AC
  Filled 2018-03-09: qty 1

## 2018-03-09 MED ORDER — PROMETHAZINE HCL 25 MG/ML IJ SOLN
INTRAMUSCULAR | Status: DC | PRN
  Administered 2018-03-09: 12.5 mg via INTRAVENOUS

## 2018-03-09 MED ORDER — MEPERIDINE HCL 100 MG/ML IJ SOLN
INTRAMUSCULAR | Status: AC
  Filled 2018-03-09: qty 2

## 2018-03-09 MED ORDER — STERILE WATER FOR IRRIGATION IR SOLN
Status: DC | PRN
  Administered 2018-03-09: 2.5 mL

## 2018-03-09 NOTE — Discharge Instructions (Signed)
You have small internal and external hemorrhoids and diverticulosis IN YOUR LEFT COLON. YOU HAD FIVE POLYPS REMOVED.    DRINK WATER TO KEEP YOUR URINE LIGHT YELLOW.  CONTINUE YOUR WEIGHT LOSS EFFORTS. YOUR BODY MASS INDEX IS OVER 30 WHICH MEANS YOU ARE OBESE. OBESITY IS ASSOCIATED WITH AN INCREASE FOR ALL CANCERS, INCLUDING ESOPHAGEAL AND COLON CANCER. A WEIGHT OF 205 LBS OR LESS  WILL KEEP YOUR BODY MASS INDEX(BMI) UNDER 30.  FOLLOW A HIGH FIBER DIET. AVOID ITEMS THAT CAUSE BLOATING. See info below.   YOUR BIOPSY RESULTS WILL BE AVAILABLE IN 7 DAYS.  USE PREPARATION H FOUR TIMES  A DAY IF NEEDED TO RELIEVE RECTAL PAIN/PRESSURE/BLEEDING.  Next colonoscopy in 3 years.  Colonoscopy Care After Read the instructions outlined below and refer to this sheet in the next week. These discharge instructions provide you with general information on caring for yourself after you leave the hospital. While your treatment has been planned according to the most current medical practices available, unavoidable complications occasionally occur. If you have any problems or questions after discharge, call DR. Noela Brothers, [phone].  ACTIVITY  You may resume your regular activity, but move at a slower pace for the next 24 hours.   Take frequent rest periods for the next 24 hours.   Walking will help get rid of the air and reduce the bloated feeling in your belly (abdomen).   No driving for 24 hours (because of the medicine (anesthesia) used during the test).   You may shower.   Do not sign any important legal documents or operate any machinery for 24 hours (because of the anesthesia used during the test).    NUTRITION  Drink plenty of fluids.   You may resume your normal diet as instructed by your doctor.   Begin with a light meal and progress to your normal diet. Heavy or fried foods are harder to digest and may make you feel sick to your stomach (nauseated).   Avoid alcoholic beverages for 24  hours or as instructed.    MEDICATIONS  You may resume your normal medications.   WHAT YOU CAN EXPECT TODAY  Some feelings of bloating in the abdomen.   Passage of more gas than usual.   Spotting of blood in your stool or on the toilet paper  .  IF YOU HAD POLYPS REMOVED DURING THE COLONOSCOPY:  Eat a soft diet IF YOU HAVE NAUSEA, BLOATING, ABDOMINAL PAIN, OR VOMITING.    FINDING OUT THE RESULTS OF YOUR TEST Not all test results are available during your visit. DR. Oneida Alar WILL CALL YOU WITHIN 14 DAYS OF YOUR PROCEDUE WITH YOUR RESULTS. Do not assume everything is normal if you have not heard from DR. Alice Vitelli, CALL HER OFFICE AT [phone].  SEEK IMMEDIATE MEDICAL ATTENTION AND CALL THE OFFICE: [phone] IF:  You have more than a spotting of blood in your stool.   Your belly is swollen (abdominal distention).   You are nauseated or vomiting.   You have a temperature over 101F.   You have abdominal pain or discomfort that is severe or gets worse throughout the day.  High-Fiber Diet A high-fiber diet changes your normal diet to include more whole grains, legumes, fruits, and vegetables. Changes in the diet involve replacing refined carbohydrates with unrefined foods. The calorie level of the diet is essentially unchanged. The Dietary Reference Intake (recommended amount) for adult males is 38 grams per day. For adult females, it is 25 grams per day. Pregnant and  lactating women should consume 28 grams of fiber per day. Fiber is the intact part of a plant that is not broken down during digestion. Functional fiber is fiber that has been isolated from the plant to provide a beneficial effect in the body. PURPOSE  Increase stool bulk.   Ease and regulate bowel movements.   Lower cholesterol.   REDUCE RISK OF COLON CANCER  INDICATIONS THAT YOU NEED MORE FIBER  Constipation and hemorrhoids.   Uncomplicated diverticulosis (intestine condition) and irritable bowel  syndrome.   Weight management.   As a protective measure against hardening of the arteries (atherosclerosis), diabetes, and cancer.   GUIDELINES FOR INCREASING FIBER IN THE DIET  Start adding fiber to the diet slowly. A gradual increase of about 5 more grams (2 slices of whole-wheat bread, 2 servings of most fruits or vegetables, or 1 bowl of high-fiber cereal) per day is best. Too rapid an increase in fiber may result in constipation, flatulence, and bloating.   Drink enough water and fluids to keep your urine clear or pale yellow. Water, juice, or caffeine-free drinks are recommended. Not drinking enough fluid may cause constipation.   Eat a variety of high-fiber foods rather than one type of fiber.   Try to increase your intake of fiber through using high-fiber foods rather than fiber pills or supplements that contain small amounts of fiber.   The goal is to change the types of food eaten. Do not supplement your present diet with high-fiber foods, but replace foods in your present diet.   INCLUDE A VARIETY OF FIBER SOURCES  Replace refined and processed grains with whole grains, canned fruits with fresh fruits, and incorporate other fiber sources. White rice, white breads, and most bakery goods contain little or no fiber.   Brown whole-grain rice, buckwheat oats, and many fruits and vegetables are all good sources of fiber. These include: broccoli, Brussels sprouts, cabbage, cauliflower, beets, sweet potatoes, white potatoes (skin on), carrots, tomatoes, eggplant, squash, berries, fresh fruits, and dried fruits.   Cereals appear to be the richest source of fiber. Cereal fiber is found in whole grains and bran. Bran is the fiber-rich outer coat of cereal grain, which is largely removed in refining. In whole-grain cereals, the bran remains. In breakfast cereals, the largest amount of fiber is found in those with "bran" in their names. The fiber content is sometimes indicated on the label.     You may need to include additional fruits and vegetables each day.   In baking, for 1 cup white flour, you may use the following substitutions:   1 cup whole-wheat flour minus 2 tablespoons.   1/2 cup white flour plus 1/2 cup whole-wheat flour.   Polyps, Colon  A polyp is extra tissue that grows inside your body. Colon polyps grow in the large intestine. The large intestine, also called the colon, is part of your digestive system. It is a long, hollow tube at the end of your digestive tract where your body makes and stores stool. Most polyps are not dangerous. They are benign. This means they are not cancerous. But over time, some types of polyps can turn into cancer. Polyps that are smaller than a pea are usually not harmful. But larger polyps could someday become or may already be cancerous. To be safe, doctors remove all polyps and test them.   PREVENTION There is not one sure way to prevent polyps. You might be able to lower your risk of getting them if you:  Eat more fruits and vegetables and less fatty food.   Do not smoke.   Avoid alcohol.   Exercise every day.   Lose weight if you are overweight.   Eating more calcium and folate can also lower your risk of getting polyps. Some foods that are rich in calcium are milk, cheese, and broccoli. Some foods that are rich in folate are chickpeas, kidney beans, and spinach.    Diverticulosis Diverticulosis is a common condition that develops when small pouches (diverticula) form in the wall of the colon. The risk of diverticulosis increases with age. It happens more often in people who eat a low-fiber diet. Most individuals with diverticulosis have no symptoms. Those individuals with symptoms usually experience belly (abdominal) pain, constipation, or loose stools (diarrhea).  HOME CARE INSTRUCTIONS  Increase the amount of fiber in your diet as directed by your caregiver or dietician. This may reduce symptoms of diverticulosis.    Drink at least 6 to 8 glasses of water each day to prevent constipation.   Try not to strain when you have a bowel movement.   Avoiding nuts and seeds to prevent complications is NOT NECESSARY.   FOODS HAVING HIGH FIBER CONTENT INCLUDE:  Fruits. Apple, peach, pear, tangerine, raisins, prunes.   Vegetables. Brussels sprouts, asparagus, broccoli, cabbage, carrot, cauliflower, romaine lettuce, spinach, summer squash, tomato, winter squash, zucchini.   Starchy Vegetables. Baked beans, kidney beans, lima beans, split peas, lentils, potatoes (with skin).   Grains. Whole wheat bread, brown rice, bran flake cereal, plain oatmeal, white rice, shredded wheat, bran muffins.   SEEK IMMEDIATE MEDICAL CARE IF:  You develop increasing pain or severe bloating.   You have an oral temperature above 101F.   You develop vomiting or bowel movements that are bloody or black.   Hemorrhoids Hemorrhoids are dilated (enlarged) veins around the rectum. Sometimes clots will form in the veins. This makes them swollen and painful. These are called thrombosed hemorrhoids. Causes of hemorrhoids include:  Constipation.   Straining to have a bowel movement.   HEAVY LIFTING   HOME CARE INSTRUCTIONS  Eat a well balanced diet and drink 6 to 8 glasses of water every day to avoid constipation. You may also use a bulk laxative.   Avoid straining to have bowel movements.   Keep anal area dry and clean.   Do not use a donut shaped pillow or sit on the toilet for long periods. This increases blood pooling and pain.   Move your bowels when your body has the urge; this will require less straining and will decrease pain and pressure.

## 2018-03-09 NOTE — Op Note (Signed)
[facility] Patient Name: Lawrence [name] Procedure Date: 03/09/2018 1:01 PM MRN: [id number] Date of Birth: 1951-01-07 Attending MD: Barney Drain MD, MD CSN: [id number] Age: 67 Admit Type: Outpatient Procedure:                Colonoscopy WITH COLD SNARE POLYPECTOMY Indications:              Family history of colonic polyps in a first-degree                            relative: SISTER, Personal history of colonic                            polyps: OCT 2017 AND POOR PREP IN RIGHT COLON Providers:                Barney Drain MD, MD, Lurline Del, RN, Nelma Rothman,                            Technician Referring MD:             Edwinna Areola. [place] Crane MD Medicines:                Promethazine 12.5 mg IV, Meperidine 75 mg IV,                            Midazolam 5 mg IV Complications:            No immediate complications. Estimated Blood Loss:     Estimated blood loss was minimal. Procedure:                Pre-Anesthesia Assessment:                           - Prior to the procedure, a History and Physical                            was performed, and patient medications and                            allergies were reviewed. The patient's tolerance of                            previous anesthesia was also reviewed. The risks                            and benefits of the procedure and the sedation                            options and risks were discussed with the patient.                            All questions were answered, and informed consent                            was obtained. Prior Anticoagulants: The patient has  taken no previous anticoagulant or antiplatelet                            agents. ASA Grade Assessment: II - A patient with                            mild systemic disease. After reviewing the risks                            and benefits, the patient was deemed in                            satisfactory condition to undergo the procedure.                     After obtaining informed consent, the colonoscope                            was passed under direct vision. Throughout the                            procedure, the patient's blood pressure, pulse, and                            oxygen saturations were monitored continuously. The                            EC-[id number] ([id number]) scope was introduced through                            the anus and advanced to the the cecum, identified                            by appendiceal orifice and ileocecal valve. The                            colonoscopy was somewhat difficult due to                            significant looping. Successful completion of the                            procedure was aided by increasing the dose of                            sedation medication, straightening and shortening                            the scope to obtain bowel loop reduction and                            COLOWRAP. The patient tolerated the procedure  fairly well. The quality of the bowel preparation                            was good. The ileocecal valve, appendiceal orifice,                            and rectum were photographed. Scope In: 1:19:10 PM Scope Out: 1:48:33 PM Scope Withdrawal Time: 0 hours 23 minutes 51 seconds  Total Procedure Duration: 0 hours 29 minutes 23 seconds  Findings:      Five sessile polyps were found in the sigmoid colon, descending colon,       mid transverse colon and ascending colon. The polyps were 4 to 6 mm in       size. These polyps were removed with a cold snare. Resection and       retrieval were complete.      A few large-mouthed diverticula were found in the descending colon.      The recto-sigmoid colon, sigmoid colon and descending colon were       moderately tortuous.      External and internal hemorrhoids were found during retroflexion. The       hemorrhoids were small. Impression:               - Five 4 to 6 mm  polyps in the sigmoid colon, in                            the descending colon, in the mid transverse colon                            and in the ascending colon, removed with a cold                            snare. Resected and retrieved.                           - Diverticulosis in the descending colon.                           - Tortuous colon.                           - External and internal hemorrhoids. Moderate Sedation:      Moderate (conscious) sedation was administered by the endoscopy nurse       and supervised by the endoscopist. The following parameters were       monitored: oxygen saturation, heart rate, blood pressure, and response       to care. Total physician intraservice time was 41 minutes. Recommendation:           - Repeat colonoscopy in 3 years for surveillance.                           - High fiber diet.                           - Continue present medications.                           -  Await pathology results.                           - Patient has a contact number available for                            emergencies. The signs and symptoms of potential                            delayed complications were discussed with the                            patient. Return to normal activities tomorrow.                            Written discharge instructions were provided to the                            patient. Procedure Code(s):        --- Professional ---                           [phone], Colonoscopy, flexible; with removal of                            tumor(s), polyp(s), or other lesion(s) by snare                            technique                           G0500, Moderate sedation services provided by the                            same physician or other qualified health care                            professional performing a gastrointestinal                            endoscopic service that sedation supports,                             requiring the presence of an independent trained                            observer to assist in the monitoring of the                            patient's level of consciousness and physiological                            status; initial 15 minutes of intra-service time;                            patient  age 50 years or older (additional time may                            be reported with [phone], as appropriate)                           [phone], Moderate sedation services provided by the                            same physician or other qualified health care                            professional performing the diagnostic or                            therapeutic service that the sedation supports,                            requiring the presence of an independent trained                            observer to assist in the monitoring of the                            patient's level of consciousness and physiological                            status; each additional 15 minutes intraservice                            time (List separately in addition to code for                            primary service)                           [phone], Moderate sedation services provided by the                            same physician or other qualified health care                            professional performing the diagnostic or                            therapeutic service that the sedation supports,                            requiring the presence of an independent trained                            observer to assist in the monitoring of the  patient's level of consciousness and physiological                            status; each additional 15 minutes intraservice                            time (List separately in addition to code for                            primary service) Diagnosis Code(s):        --- Professional ---                           D12.5,  Benign neoplasm of sigmoid colon                           D12.4, Benign neoplasm of descending colon                           D12.3, Benign neoplasm of transverse colon (hepatic                            flexure or splenic flexure)                           D12.2, Benign neoplasm of ascending colon                           K64.8, Other hemorrhoids                           Z83.71, Family history of colonic polyps                           Z86.010, Personal history of colonic polyps                           K57.30, Diverticulosis of large intestine without                            perforation or abscess without bleeding                           Q43.8, Other specified congenital malformations of                            intestine CPT copyright 2017 [employer]. All rights reserved. The codes documented in this report are preliminary and upon coder review may  be revised to meet current compliance requirements. Barney Drain, MD Barney Drain MD, MD 03/09/2018 1:57:00 PM This report has been signed electronically. Number of Addenda: 0

## 2018-03-09 NOTE — H&P (Signed)
Primary Care Physician:  [name], MD Primary Gastroenterologist:  Dr. Oneida Alar  Pre-Procedure History & Physical: HPI:  Lawrence [name] is a 67 y.o. male here for  PERSONAL HISTORY OF POLYPS.  Past Medical History:  Diagnosis Date  . Aortic atherosclerosis ([place])   . Aortic insufficiency   . Aortic stenosis   . Arthritis   . Bicuspid aortic valve    a. mild AS, mild-mod AI by echo 03/2017, 5cm thoracic aortic aneurysm by CT 07/2017.  . Essential hypertension   . GERD (gastroesophageal reflux disease)   . Gout   . Thoracic aortic aneurysm ([place])    a. 07/2017: 5cm by CT.    Past Surgical History:  Procedure Laterality Date  . COLONOSCOPY N/A 08/29/2016   Procedure: COLONOSCOPY;  Surgeon: Danie Binder, MD;  Location: [facility];  Service: Endoscopy;  Laterality: N/A;  1030  . HERNIA REPAIR    . RIGHT/LEFT HEART CATH AND CORONARY ANGIOGRAPHY N/A 09/05/2017   Procedure: RIGHT/LEFT HEART CATH AND CORONARY ANGIOGRAPHY;  Surgeon: Larey Dresser, MD;  Location: [facility];  Service: Cardiovascular;  Laterality: N/A;    Prior to Admission medications   Medication Sig Start Date End Date Taking? Authorizing Provider  amLODipine (NORVASC) 5 MG tablet Take 1 tablet (5 mg total) by mouth daily. 12/09/17 03/09/18 Yes Lendon Colonel, NP  cyclobenzaprine (FLEXERIL) 5 MG tablet Take 5 mg by mouth at bedtime as needed for muscle spasms.    Yes [provider]  ibuprofen (ADVIL,MOTRIN) 600 MG tablet Take 600 mg by mouth 3 (three) times daily as needed for moderate pain.    Yes [provider]  lisinopril (PRINIVIL,ZESTRIL) 40 MG tablet Take 1 tablet (40 mg total) by mouth daily. 12/09/17 03/09/18 Yes Lendon Colonel, NP  metoprolol tartrate (LOPRESSOR) 25 MG tablet Take 1 tablet (25 mg total) by mouth 2 (two) times daily. Patient taking differently: Take 25 mg by mouth daily.  12/09/17  Yes Lendon Colonel, NP  allopurinol (ZYLOPRIM) 300 MG tablet Take 300  mg by mouth daily as needed (gout flare).     [provider]  calcium carbonate (TUMS - DOSED IN MG ELEMENTAL CALCIUM) 500 MG chewable tablet Chew 1-2 tablets by mouth 2 (two) times daily as needed for indigestion or heartburn (for stomach pain due to spicy foods).     [provider]  nitroGLYCERIN (NITROSTAT) 0.3 MG SL tablet Place 0.3 mg under the tongue every 5 (five) minutes as needed for chest pain.    [provider]    Allergies as of 01/05/2018 - Review Complete 01/05/2018  Allergen Reaction Noted  . Morphine and related Other (See Comments) 06/17/2013    Family History  Problem Relation Age of Onset  . Colon polyps Sister   . Heart attack Mother   . Stomach cancer Father   . Colon cancer Neg Hx     Social History   Socioeconomic History  . Marital status: Widowed    Spouse name: Not on file  . Number of children: Not on file  . Years of education: Not on file  . Highest education level: Not on file  Occupational History  . Not on file  Social Needs  . Financial resource strain: Not on file  . Food insecurity:    Worry: Not on file    Inability: Not on file  . Transportation needs:    Medical: Not on file    Non-medical: Not  on file  Tobacco Use  . Smoking status: Former Smoker    Packs/day: 2.50    Years: 25.00    Pack years: 62.50    Types: Cigarettes    Last attempt to quit: 08/02/1991    Years since quitting: 26.6  . Smokeless tobacco: Never Used  Substance and Sexual Activity  . Alcohol use: Yes    Comment: Occasionally, with sporting events up to 4 at a time.  . Drug use: No  . Sexual activity: Not on file  Lifestyle  . Physical activity:    Days per week: Not on file    Minutes per session: Not on file  . Stress: Not on file  Relationships  . Social connections:    Talks on phone: Not on file    Gets together: Not on file    Attends religious service: Not on file    Active member of club or organization: Not on  file    Attends meetings of clubs or organizations: Not on file    Relationship status: Not on file  . Intimate partner violence:    Fear of current or ex partner: Not on file    Emotionally abused: Not on file    Physically abused: Not on file    Forced sexual activity: Not on file  Other Topics Concern  . Not on file  Social History Narrative  . Not on file    Review of Systems: See HPI, otherwise negative ROS   Physical Exam: BP 129/83   Pulse (!) 54   Temp 97.8 F (36.6 C)   Resp 17   Ht 5\' 10"  (1.778 m)   Wt 212 lb (96.2 kg)   SpO2 100%   BMI 30.42 kg/m  General:   Alert,  pleasant and cooperative in NAD Head:  Normocephalic and atraumatic. Neck:  Supple; Lungs:  Clear throughout to auscultation.    Heart:  Regular rate and rhythm. Abdomen:  Soft, nontender and nondistended. Normal bowel sounds, without guarding, and without rebound.   Neurologic:  Alert and  oriented x4;  grossly normal neurologically.  Impression/Plan:    PERSONAL HISTORY OF POLYPS.  PLAN:  1. TCS TODAY. DISCUSSED PROCEDURE, BENEFITS, & RISKS: < 1% chance of medication reaction, bleeding, perforation, or rupture of spleen/liver.

## 2018-03-09 NOTE — Patient Instructions (Signed)
Lawrence [name]  03/09/2018     @PREFPERIOPPHARMACY @   Your procedure is scheduled on  03/18/2018 .  Report to [facility] at  615   A.M.  Call this number if you have problems the morning of surgery:  [phone]   Remember:  Do not eat food or drink liquids after midnight.  Take these medicines the morning of surgery with A SIP OF WATER  Allopurinol, amlodipine, cyclopenzaprine, lisinopril, metoprolol.   Do not wear jewelry, make-up or nail polish.  Do not wear lotions, powders, or perfumes, or deodorant.  Do not shave 48 hours prior to surgery.  Men may shave face and neck.  Do not bring valuables to the hospital.  [facility] is not responsible for any belongings or valuables.  Contacts, dentures or bridgework may not be worn into surgery.  Leave your suitcase in the car.  After surgery it may be brought to your room.  For patients admitted to the hospital, discharge time will be determined by your treatment team.  Patients discharged the day of surgery will not be allowed to drive home.   Name and phone number of your driver:   family Special instructions:  None  Please read over the following fact sheets that you were given. Anesthesia Post-op Instructions and Care and Recovery After Surgery      Epidermal Cyst Removal Epidermal cyst removal is a procedure to remove a sac of oily material that forms under your skin (epidermal cyst). Epidermal cysts may also be called epidermoid cysts or keratin cysts. Normally, the skin secretes this oily material through a gland or a hair follicle. This type of cyst usually results when a skin gland or hair follicle becomes blocked. You may need this procedure if you have an epidermal cyst that becomes large, uncomfortable, or infected. Tell a health care provider about:  Any allergies you have.  All medicines you are taking, including vitamins, herbs, eye drops, creams, and over-the-counter medicines.  Any problems  you or family members have had with anesthetic medicines.  Any blood disorders you have.  Any surgeries you have had.  Any medical conditions you have. What are the risks? Generally, this is a safe procedure. However, problems may occur, including:  Developing another cyst.  Bleeding.  Infection.  Scarring.  What happens before the procedure?  Ask your health care provider about: ? Changing or stopping your regular medicines. This is especially important if you are taking diabetes medicines or blood thinners. ? Taking medicines such as aspirin and ibuprofen. These medicines can thin your blood. Do not take these medicines before your procedure if your health care provider instructs you not to.  If you have an infected cyst, you may have to take antibiotic medicines before or after the cyst removal. Take your antibiotics as directed by your health care provider. Finish all of the medicine even if you start to feel better.  Take a shower on the morning of your procedure. Your health care provider may ask you to use a germ-killing (antiseptic) soap. What happens during the procedure?  You will be given a medicine that numbs the area (local anesthetic).  The skin around the cyst will be cleaned with a germ-killing solution (antiseptic).  Your health care provider will make a small surgical incision over the cyst.  The cyst will be separated from the surrounding tissues that are under your skin.  If possible, the cyst will  be removed undamaged (intact).  If the cyst bursts (ruptures), it will need to be removed in pieces.  After the cyst is removed, your health care provider will control any bleeding and close the incision with small stitches (sutures). Small incisions may not need sutures, and the bleeding will be controlled by applying direct pressure with gauze.  Your health care provider may apply antibiotic ointment and a light bandage (dressing) over the incision. This  procedure may vary among health care providers and hospitals. What happens after the procedure?  If your cyst ruptured during surgery, you may need to take antibiotic medicine. If you were prescribed an antibiotic medicine, finish all of it even if you start to feel better. This information is not intended to replace advice given to you by your health care provider. Make sure you discuss any questions you have with your health care provider. Document Released: 10/25/2000 Document Revised: 04/04/2016 Document Reviewed: 07/13/2014 Elsevier Interactive Patient Education  2018 [place]. Epidermal Cyst Removal, Care After Refer to this sheet in the next few weeks. These instructions provide you with information about caring for yourself after your procedure. Your health care provider may also give you more specific instructions. Your treatment has been planned according to current medical practices, but problems sometimes occur. Call your health care provider if you have any problems or questions after your procedure. What can I expect after the procedure? After the procedure, it is common to have:  Soreness in the area where your cyst was removed.  Tightness or itching from your skin sutures.  Follow these instructions at home:  Take medicines only as directed by your health care provider.  If you were prescribed an antibiotic medicine, finish all of it even if you start to feel better.  Use antibiotic ointment as directed by your health care provider. Follow the instructions carefully.  There are many different ways to close and cover an incision, including stitches (sutures), skin glue, and adhesive strips. Follow your health care provider's instructions about: ? Incision care. ? Bandage (dressing) changes and removal. ? Incision closure removal.  Keep the bandage (dressing) dry until your health care provider says that it can be removed. Take sponge baths only. Ask your health care  provider when you can start showering or taking a bath.  After your dressing is off, check your incision every day for signs of infection. Watch for: ? Redness, swelling, or pain. ? Fluid, blood, or pus.  You can return to your normal activities. Do not do anything that stretches or puts pressure on your incision.  You can return to your normal diet.  Keep all follow-up visits as directed by your health care provider. This is important. Contact a health care provider if:  You have a fever.  Your incision bleeds.  You have redness, swelling, or pain in the incision area.  You have fluid, blood, or pus coming from your incision.  Your cyst comes back after surgery. This information is not intended to replace advice given to you by your health care provider. Make sure you discuss any questions you have with your health care provider. Document Released: 11/18/2014 Document Revised: 04/04/2016 Document Reviewed: 07/13/2014 Elsevier Interactive Patient Education  2018 [place] Anesthesia is a term that refers to techniques, procedures, and medicines that help a person stay safe and comfortable during a medical procedure. Monitored anesthesia care, or sedation, is one type of anesthesia. Your anesthesia specialist may recommend sedation if  you will be having a procedure that does not require you to be unconscious, such as:  Cataract surgery.  A dental procedure.  A biopsy.  A colonoscopy.  During the procedure, you may receive a medicine to help you relax (sedative). There are three levels of sedation:  Mild sedation. At this level, you may feel awake and relaxed. You will be able to follow directions.  Moderate sedation. At this level, you will be sleepy. You may not remember the procedure.  Deep sedation. At this level, you will be asleep. You will not remember the procedure.  The more medicine you are given, the deeper your level of sedation  will be. Depending on how you respond to the procedure, the anesthesia specialist may change your level of sedation or the type of anesthesia to fit your needs. An anesthesia specialist will monitor you closely during the procedure. Let your health care provider know about:  Any allergies you have.  All medicines you are taking, including vitamins, herbs, eye drops, creams, and over-the-counter medicines.  Any use of steroids (by mouth or as a cream).  Any problems you or family members have had with sedatives and anesthetic medicines.  Any blood disorders you have.  Any surgeries you have had.  Any medical conditions you have, such as sleep apnea.  Whether you are pregnant or may be pregnant.  Any use of cigarettes, alcohol, or street drugs. What are the risks? Generally, this is a safe procedure. However, problems may occur, including:  Getting too much medicine (oversedation).  Nausea.  Allergic reaction to medicines.  Trouble breathing. If this happens, a breathing tube may be used to help with breathing. It will be removed when you are awake and breathing on your own.  Heart trouble.  Lung trouble.  Before the procedure Staying hydrated Follow instructions from your health care provider about hydration, which may include:  Up to 2 hours before the procedure - you may continue to drink clear liquids, such as water, clear fruit juice, black coffee, and plain tea.  Eating and drinking restrictions Follow instructions from your health care provider about eating and drinking, which may include:  8 hours before the procedure - stop eating heavy meals or foods such as meat, fried foods, or fatty foods.  6 hours before the procedure - stop eating light meals or foods, such as toast or cereal.  6 hours before the procedure - stop drinking milk or drinks that contain milk.  2 hours before the procedure - stop drinking clear liquids.  Medicines Ask your health care  provider about:  Changing or stopping your regular medicines. This is especially important if you are taking diabetes medicines or blood thinners.  Taking medicines such as aspirin and ibuprofen. These medicines can thin your blood. Do not take these medicines before your procedure if your health care provider instructs you not to.  Tests and exams  You will have a physical exam.  You may have blood tests done to show: ? How well your kidneys and liver are working. ? How well your blood can clot.  General instructions  Plan to have someone take you home from the hospital or clinic.  If you will be going home right after the procedure, plan to have someone with you for 24 hours.  What happens during the procedure?  Your blood pressure, heart rate, breathing, level of pain and overall condition will be monitored.  An IV tube will be inserted into one of  your veins.  Your anesthesia specialist will give you medicines as needed to keep you comfortable during the procedure. This may mean changing the level of sedation.  The procedure will be performed. After the procedure  Your blood pressure, heart rate, breathing rate, and blood oxygen level will be monitored until the medicines you were given have worn off.  Do not drive for 24 hours if you received a sedative.  You may: ? Feel sleepy, clumsy, or nauseous. ? Feel forgetful about what happened after the procedure. ? Have a sore throat if you had a breathing tube during the procedure. ? Vomit. This information is not intended to replace advice given to you by your health care provider. Make sure you discuss any questions you have with your health care provider. Document Released: 07/24/2005 Document Revised: 04/05/2016 Document Reviewed: 02/18/2016 Elsevier Interactive Patient Education  2018 [place], Care After These instructions provide you with information about caring for yourself after  your procedure. Your health care provider may also give you more specific instructions. Your treatment has been planned according to current medical practices, but problems sometimes occur. Call your health care provider if you have any problems or questions after your procedure. What can I expect after the procedure? After your procedure, it is common to:  Feel sleepy for several hours.  Feel clumsy and have poor balance for several hours.  Feel forgetful about what happened after the procedure.  Have poor judgment for several hours.  Feel nauseous or vomit.  Have a sore throat if you had a breathing tube during the procedure.  Follow these instructions at home: For at least 24 hours after the procedure:   Do not: ? Participate in activities in which you could fall or become injured. ? Drive. ? Use heavy machinery. ? Drink alcohol. ? Take sleeping pills or medicines that cause drowsiness. ? Make important decisions or sign legal documents. ? Take care of children on your own.  Rest. Eating and drinking  Follow the diet that is recommended by your health care provider.  If you vomit, drink water, juice, or soup when you can drink without vomiting.  Make sure you have little or no nausea before eating solid foods. General instructions  Have a responsible adult stay with you until you are awake and alert.  Take over-the-counter and prescription medicines only as told by your health care provider.  If you smoke, do not smoke without supervision.  Keep all follow-up visits as told by your health care provider. This is important. Contact a health care provider if:  You keep feeling nauseous or you keep vomiting.  You feel light-headed.  You develop a rash.  You have a fever. Get help right away if:  You have trouble breathing. This information is not intended to replace advice given to you by your health care provider. Make sure you discuss any questions you have  with your health care provider. Document Released: 02/18/2016 Document Revised: 06/19/2016 Document Reviewed: 02/18/2016 Elsevier Interactive Patient Education  [employer].

## 2018-03-11 ENCOUNTER — Encounter (HOSPITAL_COMMUNITY)
Admission: RE | Admit: 2018-03-11 | Discharge: 2018-03-11 | Disposition: A | Discharge status: Home / Self Care | Payer: Medicare HMO | Source: Ambulatory Visit | Attending: General Surgery | Admitting: General Surgery

## 2018-03-11 ENCOUNTER — Encounter (HOSPITAL_COMMUNITY): Payer: Self-pay

## 2018-03-11 ENCOUNTER — Other Ambulatory Visit: Payer: Self-pay

## 2018-03-11 DIAGNOSIS — R9431 Abnormal electrocardiogram [ECG] [EKG]: Secondary | ICD-10-CM | POA: Insufficient documentation

## 2018-03-11 DIAGNOSIS — R001 Bradycardia, unspecified: Secondary | ICD-10-CM | POA: Diagnosis not present

## 2018-03-11 DIAGNOSIS — Z01812 Encounter for preprocedural laboratory examination: Secondary | ICD-10-CM | POA: Insufficient documentation

## 2018-03-11 DIAGNOSIS — I444 Left anterior fascicular block: Secondary | ICD-10-CM | POA: Insufficient documentation

## 2018-03-11 DIAGNOSIS — Z01818 Encounter for other preprocedural examination: Secondary | ICD-10-CM | POA: Insufficient documentation

## 2018-03-11 LAB — BASIC METABOLIC PANEL
Anion gap: 7 (ref 5–15)
BUN: 17 mg/dL (ref 6–20)
CO2: 26 mmol/L (ref 22–32)
CREATININE: 1.14 mg/dL (ref 0.61–1.24)
Calcium: 9.4 mg/dL (ref 8.9–10.3)
Chloride: 105 mmol/L (ref 101–111)
GFR calc non Af Amer: >60 mL/min (ref >60)
Glucose, Bld: 113 mg/dL — ABNORMAL HIGH (ref 65–99)
POTASSIUM: 4.5 mmol/L (ref 3.5–5.1)
SODIUM: 138 mmol/L (ref 135–145)

## 2018-03-11 LAB — CBC WITH DIFFERENTIAL/PLATELET
BASOS ABS: 0.1 10*3/uL (ref 0.0–0.1)
BASOS PCT: 1 %
Eosinophils Absolute: 0.2 10*3/uL (ref 0.0–0.7)
Eosinophils Relative: 4 %
HCT: 44.8 % (ref 39.0–52.0)
HEMOGLOBIN: 14.3 g/dL (ref 13.0–17.0)
Lymphocytes Relative: 49 %
Lymphs Abs: 1.9 10*3/uL (ref 0.7–4.0)
MCH: 30 pg (ref 26.0–34.0)
MCHC: 31.9 g/dL (ref 30.0–36.0)
MCV: 94.1 fL (ref 78.0–100.0)
Monocytes Absolute: 0.5 10*3/uL (ref 0.1–1.0)
Monocytes Relative: 11 %
NEUTROS PCT: 35 %
Neutro Abs: 1.4 10*3/uL — ABNORMAL LOW (ref 1.7–7.7)
Platelets: 195 10*3/uL (ref 150–400)
RBC: 4.76 MIL/uL (ref 4.22–5.81)
RDW: 13.1 % (ref 11.5–15.5)
WBC: 3.9 10*3/uL — AB (ref 4.0–10.5)

## 2018-03-11 NOTE — Progress Notes (Signed)
   03/11/18 3729  OBSTRUCTIVE SLEEP APNEA  Have you ever been diagnosed with sleep apnea through a sleep study? No  Do you snore loudly (loud enough to be heard through closed doors)?  0  Do you often feel tired, fatigued, or sleepy during the daytime (such as falling asleep during driving or talking to someone)? 0  Has anyone observed you stop breathing during your sleep? 1  Do you have, or are you being treated for high blood pressure? 1  BMI more than 35 kg/m2? 0  Age > 50 (1-yes) 1  Neck circumference greater than:Male 16 inches or larger, Male 17inches or larger? 0  Male Gender (Yes=1) 1  Obstructive Sleep Apnea Score 4

## 2018-03-11 NOTE — Telephone Encounter (Signed)
Returned pt call. No answer, no voicemail. Not able to leave message. Will mail pt a letter.

## 2018-03-12 ENCOUNTER — Encounter (HOSPITAL_COMMUNITY): Payer: Self-pay | Admitting: Gastroenterology

## 2018-03-12 ENCOUNTER — Other Ambulatory Visit (HOSPITAL_COMMUNITY): Payer: Medicare HMO

## 2018-03-18 ENCOUNTER — Encounter (HOSPITAL_COMMUNITY): Payer: Self-pay | Admitting: *Deleted

## 2018-03-18 ENCOUNTER — Ambulatory Visit (HOSPITAL_COMMUNITY): Payer: Medicare HMO | Admitting: Anesthesiology

## 2018-03-18 ENCOUNTER — Ambulatory Visit (HOSPITAL_COMMUNITY)
Admission: RE | Admit: 2018-03-18 | Discharge: 2018-03-18 | Disposition: A | Discharge status: Home / Self Care | Payer: Medicare HMO | Source: Ambulatory Visit | Attending: General Surgery | Admitting: General Surgery

## 2018-03-18 ENCOUNTER — Encounter (HOSPITAL_COMMUNITY)
Admission: RE | Disposition: A | Discharge status: Home / Self Care | Payer: Self-pay | Source: Ambulatory Visit | Attending: General Surgery

## 2018-03-18 DIAGNOSIS — L720 Epidermal cyst: Secondary | ICD-10-CM | POA: Diagnosis not present

## 2018-03-18 DIAGNOSIS — I252 Old myocardial infarction: Secondary | ICD-10-CM | POA: Diagnosis not present

## 2018-03-18 DIAGNOSIS — Q231 Congenital insufficiency of aortic valve: Secondary | ICD-10-CM | POA: Diagnosis not present

## 2018-03-18 DIAGNOSIS — J439 Emphysema, unspecified: Secondary | ICD-10-CM | POA: Diagnosis not present

## 2018-03-18 DIAGNOSIS — I700 Atherosclerosis of aorta: Secondary | ICD-10-CM | POA: Insufficient documentation

## 2018-03-18 DIAGNOSIS — K219 Gastro-esophageal reflux disease without esophagitis: Secondary | ICD-10-CM | POA: Insufficient documentation

## 2018-03-18 DIAGNOSIS — I10 Essential (primary) hypertension: Secondary | ICD-10-CM | POA: Insufficient documentation

## 2018-03-18 DIAGNOSIS — Z79899 Other long term (current) drug therapy: Secondary | ICD-10-CM | POA: Insufficient documentation

## 2018-03-18 DIAGNOSIS — M1990 Unspecified osteoarthritis, unspecified site: Secondary | ICD-10-CM | POA: Diagnosis not present

## 2018-03-18 DIAGNOSIS — Z8249 Family history of ischemic heart disease and other diseases of the circulatory system: Secondary | ICD-10-CM | POA: Insufficient documentation

## 2018-03-18 DIAGNOSIS — Z87891 Personal history of nicotine dependence: Secondary | ICD-10-CM | POA: Diagnosis not present

## 2018-03-18 DIAGNOSIS — I712 Thoracic aortic aneurysm, without rupture: Secondary | ICD-10-CM | POA: Diagnosis not present

## 2018-03-18 DIAGNOSIS — M109 Gout, unspecified: Secondary | ICD-10-CM | POA: Insufficient documentation

## 2018-03-18 DIAGNOSIS — Z885 Allergy status to narcotic agent status: Secondary | ICD-10-CM | POA: Diagnosis not present

## 2018-03-18 DIAGNOSIS — L723 Sebaceous cyst: Secondary | ICD-10-CM

## 2018-03-18 DIAGNOSIS — I739 Peripheral vascular disease, unspecified: Secondary | ICD-10-CM | POA: Insufficient documentation

## 2018-03-18 HISTORY — PX: MASS EXCISION: SHX2000

## 2018-03-18 SURGERY — EXCISION MASS
Anesthesia: General

## 2018-03-18 MED ORDER — BUPIVACAINE HCL (PF) 0.5 % IJ SOLN
INTRAMUSCULAR | Status: DC | PRN
  Administered 2018-03-18: 10 mL

## 2018-03-18 MED ORDER — CHLORHEXIDINE GLUCONATE CLOTH 2 % EX PADS: 6.0000 | MEDICATED_PAD | Freq: Once | CUTANEOUS | Status: DC

## 2018-03-18 MED ORDER — BUPIVACAINE HCL (PF) 0.5 % IJ SOLN
INTRAMUSCULAR | Status: AC
  Filled 2018-03-18: qty 30

## 2018-03-18 MED ORDER — FENTANYL CITRATE (PF) 100 MCG/2ML IJ SOLN: 25.0000 ug | INTRAMUSCULAR | Status: DC | PRN

## 2018-03-18 MED ORDER — 0.9 % SODIUM CHLORIDE (POUR BTL) OPTIME
TOPICAL | Status: DC | PRN
  Administered 2018-03-18: 1000 mL

## 2018-03-18 MED ORDER — LACTATED RINGERS IV SOLN
INTRAVENOUS | Status: DC | PRN
  Administered 2018-03-18: 07:00:00 via INTRAVENOUS

## 2018-03-18 MED ORDER — LIDOCAINE HCL (PF) 1 % IJ SOLN
INTRAMUSCULAR | Status: AC
Start: 2018-03-18 — End: ?
  Filled 2018-03-18: qty 5

## 2018-03-18 MED ORDER — PROPOFOL 10 MG/ML IV BOLUS
INTRAVENOUS | Status: DC | PRN
  Administered 2018-03-18: 150 mg via INTRAVENOUS

## 2018-03-18 MED ORDER — FENTANYL CITRATE (PF) 100 MCG/2ML IJ SOLN
INTRAMUSCULAR | Status: DC | PRN
  Administered 2018-03-18: 100 ug via INTRAVENOUS

## 2018-03-18 MED ORDER — OXYCODONE HCL 5 MG PO TABS: 5.0000 mg | ORAL_TABLET | ORAL | 0 refills | Status: DC | PRN

## 2018-03-18 MED ORDER — ONDANSETRON HCL 4 MG/2ML IJ SOLN
INTRAMUSCULAR | Status: AC
Start: 2018-03-18 — End: ?
  Filled 2018-03-18: qty 2

## 2018-03-18 MED ORDER — PROPOFOL 10 MG/ML IV BOLUS
INTRAVENOUS | Status: AC
  Filled 2018-03-18: qty 20

## 2018-03-18 MED ORDER — MIDAZOLAM HCL 5 MG/5ML IJ SOLN
INTRAMUSCULAR | Status: DC | PRN
  Administered 2018-03-18: 2 mg via INTRAVENOUS

## 2018-03-18 MED ORDER — ONDANSETRON HCL 4 MG/2ML IJ SOLN
INTRAMUSCULAR | Status: DC | PRN
  Administered 2018-03-18: 4 mg via INTRAVENOUS

## 2018-03-18 MED ORDER — MIDAZOLAM HCL 2 MG/2ML IJ SOLN
INTRAMUSCULAR | Status: AC
  Filled 2018-03-18: qty 2

## 2018-03-18 MED ORDER — LACTATED RINGERS IV SOLN
INTRAVENOUS | Status: DC
  Administered 2018-03-18: 08:00:00 via INTRAVENOUS

## 2018-03-18 MED ORDER — CEFAZOLIN SODIUM-DEXTROSE 2-4 GM/100ML-% IV SOLN
2.0000 g | INTRAVENOUS | Status: AC
  Administered 2018-03-18: 2 g via INTRAVENOUS
  Filled 2018-03-18: qty 100

## 2018-03-18 MED ORDER — DOCUSATE SODIUM 100 MG PO CAPS: 100.0000 mg | ORAL_CAPSULE | Freq: Two times a day (BID) | ORAL | 2 refills | Status: DC

## 2018-03-18 MED ORDER — EPHEDRINE SULFATE 50 MG/ML IJ SOLN
INTRAMUSCULAR | Status: DC | PRN
  Administered 2018-03-18: 20 mg via INTRAVENOUS

## 2018-03-18 MED ORDER — LIDOCAINE HCL (CARDIAC) PF 100 MG/5ML IV SOSY
PREFILLED_SYRINGE | INTRAVENOUS | Status: DC | PRN
  Administered 2018-03-18: 60 mg via INTRAVENOUS

## 2018-03-18 MED ORDER — FENTANYL CITRATE (PF) 100 MCG/2ML IJ SOLN
INTRAMUSCULAR | Status: AC
  Filled 2018-03-18: qty 2

## 2018-03-18 MED ORDER — SUCCINYLCHOLINE CHLORIDE 20 MG/ML IJ SOLN
INTRAMUSCULAR | Status: DC | PRN
  Administered 2018-03-18: 100 mg via INTRAVENOUS

## 2018-03-18 SURGICAL SUPPLY — 30 items
CHLORAPREP W/TINT 10.5 ML (MISCELLANEOUS) ×2 IMPLANT
CLOTH BEACON ORANGE TIMEOUT ST (SAFETY) ×2 IMPLANT
COVER LIGHT HANDLE STERIS (MISCELLANEOUS) ×4 IMPLANT
DECANTER SPIKE VIAL GLASS SM (MISCELLANEOUS) ×2 IMPLANT
ELECT NEEDLE TIP 2.8 STRL (NEEDLE) IMPLANT
ELECT REM PT RETURN 9FT ADLT (ELECTROSURGICAL) ×2
ELECTRODE REM PT RTRN 9FT ADLT (ELECTROSURGICAL) ×1 IMPLANT
GLOVE BIO SURGEON STRL SZ 6.5 (GLOVE) ×2 IMPLANT
GLOVE BIOGEL PI IND STRL 6.5 (GLOVE) ×1 IMPLANT
GLOVE BIOGEL PI IND STRL 7.0 (GLOVE) ×1 IMPLANT
GLOVE BIOGEL PI INDICATOR 6.5 (GLOVE) ×1
GLOVE BIOGEL PI INDICATOR 7.0 (GLOVE) ×1
GOWN STRL REUS W/ TWL XL LVL3 (GOWN DISPOSABLE) ×1 IMPLANT
GOWN STRL REUS W/TWL LRG LVL3 (GOWN DISPOSABLE) ×2 IMPLANT
GOWN STRL REUS W/TWL XL LVL3 (GOWN DISPOSABLE) ×1
KIT TURNOVER KIT A (KITS) ×2 IMPLANT
MANIFOLD NEPTUNE II (INSTRUMENTS) ×2 IMPLANT
NEEDLE HYPO 21X1.5 SAFETY (NEEDLE) ×2 IMPLANT
NS IRRIG 1000ML POUR BTL (IV SOLUTION) ×2 IMPLANT
PACK MINOR (CUSTOM PROCEDURE TRAY) IMPLANT
PAD ARMBOARD 7.5X6 YLW CONV (MISCELLANEOUS) ×2 IMPLANT
SET BASIN LINEN APH (SET/KITS/TRAYS/PACK) ×2 IMPLANT
SPONGE GAUZE 2X2 8PLY STRL LF (GAUZE/BANDAGES/DRESSINGS) ×4 IMPLANT
SUT ETHILON 3 0 FSL (SUTURE) ×2 IMPLANT
SUT MNCRL AB 4-0 PS2 18 (SUTURE) ×2 IMPLANT
SUT PROLENE 4 0 PS 2 18 (SUTURE) IMPLANT
SUT VIC AB 3-0 SH 27 (SUTURE) ×1
SUT VIC AB 3-0 SH 27X BRD (SUTURE) ×1 IMPLANT
SYR CONTROL 10ML LL (SYRINGE) ×2 IMPLANT
TAPE PAPER MEDFIX 1IN X 10YD (GAUZE/BANDAGES/DRESSINGS) ×2 IMPLANT

## 2018-03-18 NOTE — Discharge Instructions (Signed)
Discharge Instructions: Remove the dressing tomorrow and replace with a dry dressing as needed.  Shower per your regular routine. Take tylenol and ibuprofen as needed for pain control, alternating every 4-6 hours.  Take Roxicodone for breakthrough pain.  Take colace for constipation related to narcotic pain medication. Do not pick at the stitches at your incision site. Expect some drainage and replace the bandage as needed.  Attempt to limit excessive stretch, reaching with your arms for the next 1 week.  Follow up in 2 weeks to get your stitches removed.   Epidermal (Sebaceous) Cyst Removal, Care After Refer to this sheet in the next few weeks. These instructions provide you with information about caring for yourself after your procedure. Your health care provider may also give you more specific instructions. Your treatment has been planned according to current medical practices, but problems sometimes occur. Call your health care provider if you have any problems or questions after your procedure. What can I expect after the procedure? After the procedure, it is common to have:  Soreness in the area where your cyst was removed.  Tightness or itching from your skin sutures.  Follow these instructions at home:  Take medicines only as directed by your health care provider.  If you were prescribed an antibiotic medicine, finish all of it even if you start to feel better.  Use antibiotic ointment as directed by your health care provider. Follow the instructions carefully.  There are many different ways to close and cover an incision, including stitches (sutures), skin glue, and adhesive strips. Follow your health care provider's instructions about: ? Incision care. ? Bandage (dressing) changes and removal. ? Incision closure removal.  Keep the bandage (dressing) dry until your health care provider says that it can be removed. Take sponge baths only. Ask your health care provider when  you can start showering or taking a bath.  After your dressing is off, check your incision every day for signs of infection. Watch for: ? Redness, swelling, or pain. ? Fluid, blood, or pus.  You can return to your normal activities. Do not do anything that stretches or puts pressure on your incision.  You can return to your normal diet.  Keep all follow-up visits as directed by your health care provider. This is important. Contact a health care provider if:  You have a fever.  Your incision bleeds.  You have redness, swelling, or pain in the incision area.  You have fluid, blood, or pus coming from your incision.  Your cyst comes back after surgery. This information is not intended to replace advice given to you by your health care provider. Make sure you discuss any questions you have with your health care provider. Document Released: 11/18/2014 Document Revised: 04/04/2016 Document Reviewed: 07/13/2014 Elsevier Interactive Patient Education  2018 [employer].  Oxycodone tablets or capsules What is this medicine? OXYCODONE (ox i KOE done) is a pain reliever. It is used to treat moderate to severe pain. This medicine may be used for other purposes; ask your health care provider or pharmacist if you have questions. COMMON BRAND NAME(S): Dazidox, Endocodone, Oxaydo, OXECTA, OxyIR, Percolone, Roxicodone, ROXYBOND What should I tell my health care provider before I take this medicine? They need to know if you have any of these conditions: -Addison's disease -brain tumor -head injury -heart disease -history of drug or alcohol abuse problem -if you often drink alcohol -kidney disease -liver disease -lung or breathing disease, like asthma -mental illness -pancreatic disease -seizures -  thyroid disease -an unusual or allergic reaction to oxycodone, codeine, hydrocodone, morphine, other medicines, foods, dyes, or preservatives -pregnant or trying to get  pregnant -breast-feeding How should I use this medicine? Take this medicine by mouth with a glass of water. Follow the directions on the prescription label. You can take it with or without food. If it upsets your stomach, take it with food. Take your medicine at regular intervals. Do not take it more often than directed. Do not stop taking except on your doctor's advice. Some brands of this medicine, like Oxecta, have special instructions. Ask your doctor or pharmacist if these directions are for you: Do not cut, crush or chew this medicine. Swallow only one tablet at a time. Do not wet, soak, or lick the tablet before you take it. A special MedGuide will be given to you by the pharmacist with each prescription and refill. Be sure to read this information carefully each time. Talk to your pediatrician regarding the use of this medicine in children. Special care may be needed. Overdosage: If you think you have taken too much of this medicine contact a poison control center or emergency room at once. NOTE: This medicine is only for you. Do not share this medicine with others. What if I miss a dose? If you miss a dose, take it as soon as you can. If it is almost time for your next dose, take only that dose. Do not take double or extra doses. What may interact with this medicine? This medicine may interact with the following medications: -alcohol -antihistamines for allergy, cough and cold -antiviral medicines for HIV or AIDS -atropine -certain antibiotics like clarithromycin, erythromycin, linezolid, rifampin -certain medicines for anxiety or sleep -certain medicines for bladder problems like oxybutynin, tolterodine -certain medicines for depression like amitriptyline, fluoxetine, sertraline -certain medicines for fungal infections like ketoconazole, itraconazole, voriconazole -certain medicines for migraine headache like almotriptan, eletriptan, frovatriptan, naratriptan, rizatriptan,  sumatriptan, zolmitriptan -certain medicines for nausea or vomiting like dolasetron, ondansetron, palonosetron -certain medicines for Parkinson's disease like benztropine, trihexyphenidyl -certain medicines for seizures like phenobarbital, phenytoin, primidone -certain medicines for stomach problems like dicyclomine, hyoscyamine -certain medicines for travel sickness like scopolamine -diuretics -general anesthetics like halothane, isoflurane, methoxyflurane, propofol -ipratropium -local anesthetics like lidocaine, pramoxine, tetracaine -MAOIs like Carbex, Eldepryl, Marplan, Nardil, and Parnate -medicines that relax muscles for surgery -methylene blue -nilotinib -other narcotic medicines for pain or cough -phenothiazines like chlorpromazine, mesoridazine, prochlorperazine, thioridazine This list may not describe all possible interactions. Give your health care provider a list of all the medicines, herbs, non-prescription drugs, or dietary supplements you use. Also tell them if you smoke, drink alcohol, or use illegal drugs. Some items may interact with your medicine. What should I watch for while using this medicine? Tell your doctor or health care professional if your pain does not go away, if it gets worse, or if you have new or a different type of pain. You may develop tolerance to the medicine. Tolerance means that you will need a higher dose of the medicine for pain relief. Tolerance is normal and is expected if you take this medicine for a long time. Do not suddenly stop taking your medicine because you may develop a severe reaction. Your body becomes used to the medicine. This does NOT mean you are addicted. Addiction is a behavior related to getting and using a drug for a non-medical reason. If you have pain, you have a medical reason to take pain medicine. Your doctor will tell  you how much medicine to take. If your doctor wants you to stop the medicine, the dose will be slowly lowered  over time to avoid any side effects. There are different types of narcotic medicines (opiates). If you take more than one type at the same time or if you are taking another medicine that also causes drowsiness, you may have more side effects. Give your health care provider a list of all medicines you use. Your doctor will tell you how much medicine to take. Do not take more medicine than directed. Call emergency for help if you have problems breathing or unusual sleepiness. You may get drowsy or dizzy. Do not drive, use machinery, or do anything that needs mental alertness until you know how the medicine affects you. Do not stand or sit up quickly, especially if you are an older patient. This reduces the risk of dizzy or fainting spells. Alcohol may interfere with the effect of this medicine. Avoid alcoholic drinks. This medicine will cause constipation. Try to have a bowel movement at least every 2 to 3 days. If you do not have a bowel movement for 3 days, call your doctor or health care professional. Your mouth may get dry. Chewing sugarless gum or sucking hard candy, and drinking plenty of water may help. Contact your doctor if the problem does not go away or is severe. What side effects may I notice from receiving this medicine? Side effects that you should report to your doctor or health care professional as soon as possible: -allergic reactions like skin rash, itching or hives, swelling of the face, lips, or tongue -breathing problems -confusion -signs and symptoms of low blood pressure like dizziness; feeling faint or lightheaded, falls; unusually weak or tired -trouble passing urine or change in the amount of urine -trouble swallowing Side effects that usually do not require medical attention (report to your doctor or health care professional if they continue or are bothersome): -constipation -dry mouth -nausea, vomiting -tiredness This list may not describe all possible side effects. Call  your doctor for medical advice about side effects. You may report side effects to FDA at 1-800-FDA-1088. Where should I keep my medicine? Keep out of the reach of children. This medicine can be abused. Keep your medicine in a safe place to protect it from theft. Do not share this medicine with anyone. Selling or giving away this medicine is dangerous and against the law. Store at room temperature between 15 and 30 degrees C (59 and 86 degrees F). Protect from light. Keep container tightly closed. This medicine may cause accidental overdose and death if it is taken by other adults, children, or pets. Flush any unused medicine down the toilet to reduce the chance of harm. Do not use the medicine after the expiration date. NOTE: This sheet is a summary. It may not cover all possible information. If you have questions about this medicine, talk to your doctor, pharmacist, or health care provider.  2018 Elsevier/Gold Standard (2015-09-12 16:55:57)

## 2018-03-18 NOTE — Transfer of Care (Signed)
Immediate Anesthesia Transfer of Care Note  Patient: Lawrence [name]  Procedure(s) Performed: EXCISION CYST ON BACK 2CM (N/A )  Patient Location: PACU  Anesthesia Type:General  Level of Consciousness: awake  Airway & Oxygen Therapy: Patient Spontanous Breathing and Patient connected to face mask oxygen  Post-op Assessment: Report given to RN and Post -op Vital signs reviewed and stable  Post vital signs: Reviewed and stable  Last Vitals:  Vitals Value Taken Time  BP 138/79 03/18/2018  8:47 AM  Temp 36.5 C 03/18/2018  8:47 AM  Pulse 52 03/18/2018  8:53 AM  Resp 18 03/18/2018  8:53 AM  SpO2 99 % 03/18/2018  8:53 AM  Vitals shown include unvalidated device data.  Last Pain:  Vitals:   03/18/18 0847  TempSrc:   PainSc: (P) 0-No pain      Patients Stated Pain Goal: 8 (66/06/00 4599)  Complications: No apparent anesthesia complications

## 2018-03-18 NOTE — Anesthesia Postprocedure Evaluation (Signed)
Anesthesia Post Note  Patient: Lawrence [name]  Procedure(s) Performed: EXCISION CYST ON BACK 2CM (N/A )  Patient location during evaluation: PACU Anesthesia Type: General Level of consciousness: awake and alert and patient cooperative Pain management: satisfactory to patient Vital Signs Assessment: post-procedure vital signs reviewed and stable Respiratory status: spontaneous breathing Cardiovascular status: stable Postop Assessment: no apparent nausea or vomiting Anesthetic complications: no     Last Vitals:  Vitals:   03/18/18 0915 03/18/18 0920  BP: 132/82 123/80  Pulse: (!) 58 (!) 57  Resp: 18 16  Temp:  36.6 C  SpO2: 94% 91%    Last Pain:  Vitals:   03/18/18 0920  TempSrc: Oral  PainSc: 0-No pain                 Jossette Zirbel

## 2018-03-18 NOTE — Anesthesia Preprocedure Evaluation (Addendum)
Anesthesia Evaluation  Patient identified by MRN, date of birth, ID band Patient awake    Reviewed: Allergy & Precautions, NPO status , Patient's Chart, lab work & pertinent test results  History of Anesthesia Complications Negative for: history of anesthetic complications  Airway Mallampati: II  TM Distance: >3 FB Neck ROM: Full    Dental no notable dental hx. (+) Edentulous Upper, Upper Dentures, Edentulous Lower   Pulmonary neg pulmonary ROS, former smoker,    Pulmonary exam normal breath sounds clear to auscultation       Cardiovascular Exercise Tolerance: Good hypertension, Pt. on medications + Peripheral Vascular Disease and + DOE  negative cardio ROS Normal cardiovascular examI Rhythm:Regular Rate:Normal  Only occ DOE -none recent    Neuro/Psych negative neurological ROS  negative psych ROS   GI/Hepatic negative GI ROS, Neg liver ROS, GERD  ,Only PRN meds  Denies current Sx   Endo/Other  negative endocrine ROS  Renal/GU negative Renal ROS  negative genitourinary   Musculoskeletal negative musculoskeletal ROS (+) Arthritis , Osteoarthritis,    Abdominal   Peds negative pediatric ROS (+)  Hematology negative hematology ROS (+)   Anesthesia Other Findings   Reproductive/Obstetrics negative OB ROS                             Anesthesia Physical Anesthesia Plan  ASA: II  Anesthesia Plan: General   Post-op Pain Management:    Induction: Intravenous  PONV Risk Score and Plan:   Airway Management Planned:   Additional Equipment:   Intra-op Plan:   Post-operative Plan: Extubation in OR  Informed Consent: I have reviewed the patients History and Physical, chart, labs and discussed the procedure including the risks, benefits and alternatives for the proposed anesthesia with the patient or authorized representative who has indicated his/her understanding and acceptance.    Dental advisory given  Plan Discussed with: CRNA  Anesthesia Plan Comments:         Anesthesia Quick Evaluation

## 2018-03-18 NOTE — Interval H&P Note (Signed)
History and Physical Interval Note:  03/18/2018 7:15 AM  Lawrence [name]  has presented today for surgery, with the diagnosis of 2 cm sebaceous cyst on back  The various methods of treatment have been discussed with the patient and family. After consideration of risks, benefits and other options for treatment, the patient has consented to  Procedure(s): EXCISION CYST ON BACK 2CM (N/A) as a surgical intervention .  The patient's history has been reviewed, patient examined, no change in status, stable for surgery.  I have reviewed the patient's chart and labs.  Questions were answered to the patient's satisfaction.     [name]  No questions. Marked cyst on back.  [name], MD [facility] [address] [place], [place] [address] [phone] (office)

## 2018-03-18 NOTE — Op Note (Signed)
[facility] Operative Note  03/18/18  Preoperative Diagnosis:  Back sebaceous cyst    Postoperative Diagnosis: Infected sebaceous cyst on back    Procedure(s) Performed:  Excision of cyst, 2 cm    Surgeon: Lanell Matar. Constance Haw, MD   Assistants: No qualified resident was available   Anesthesia: General endotracheal   Anesthesiologist: Lenice Llamas, MD    Specimens:  Cyst    Estimated Blood Loss: Minimal   Blood Replacement: None    Complications: None   Wound Class: Infected/ Dirty    Operative Indications:  Lawrence [name] is a 67 yo with a history of an infected back sebaceous cyst that has been draining over the last few weeks. He has been having multiple episodes that required antibiotics. He otherwise has been well, and underwent cardiology evaluation that was previous scheduled that demonstrated a stable aortic aneurysm.  He was deemed acceptable risk by cardiology.   Findings: Infected cyst    Procedure: The patient was taken to the operating room and placed supine. General endotracheal anesthesia was induced. Intravenous antibiotics were administered per protocol.  He was then turned to his left lateral decubitus position as the cyst is on the right mid back, and all pressure points were padded. The back was prepared and draped in the usual sterile fashion.   An elliptical incision was made overlying the skin that had an area of ulceration that was draining keratinous material.  The skin that remained was healthy without signs of infection. This was carried down through the subcutaneous tissue and the cyst was excised using cautery and blunt dissection.  The cyst was noted to be inflamed with signs of purulent drainage.  The cavity was irrigated and local anesthesia was placed. He  A 3-0 Vicryl was used to close some of the dead space and a 3-0 Nylon was used with a vertical mattress closure to close the skin. A dry dressing was placed.    Final inspection  revealed acceptable hemostasis. All counts were correct at the end of the case. The patient was awakened from anesthesia and extubate without complication.  The patient went to the PACU in stable condition.   [name], MD [facility] [address] [place], [place] [address] [phone] (office)

## 2018-03-18 NOTE — Anesthesia Procedure Notes (Signed)
Procedure Name: Intubation Date/Time: 03/18/2018 7:39 AM Performed by: Purvis Kilts, CRNA Pre-anesthesia Checklist: Patient identified, Emergency Drugs available, Suction available, Patient being monitored and Timeout performed Patient Re-evaluated:Patient Re-evaluated prior to induction Oxygen Delivery Method: Circle system utilized Preoxygenation: Pre-oxygenation with 100% oxygen Induction Type: IV induction Ventilation: Mask ventilation without difficulty Laryngoscope Size: Mac and 3 Grade View: Grade I Tube type: Oral Tube size: 7.0 mm Number of attempts: 1 Airway Equipment and Method: Stylet Placement Confirmation: ETT inserted through vocal cords under direct vision,  positive ETCO2 and breath sounds checked- equal and bilateral Secured at: 21 cm Tube secured with: Tape Dental Injury: Teeth and Oropharynx as per pre-operative assessment

## 2018-03-19 ENCOUNTER — Telehealth: Payer: Self-pay | Admitting: Gastroenterology

## 2018-03-19 ENCOUNTER — Encounter (HOSPITAL_COMMUNITY): Payer: Self-pay | Admitting: General Surgery

## 2018-03-19 NOTE — Telephone Encounter (Signed)
PT is aware.

## 2018-03-19 NOTE — Telephone Encounter (Signed)
ON RECALL  °

## 2018-03-19 NOTE — Telephone Encounter (Signed)
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  NEXT COLONOSCOPY IN 3 YEARS.

## 2018-03-31 ENCOUNTER — Telehealth: Payer: Self-pay | Admitting: General Surgery

## 2018-03-31 NOTE — Telephone Encounter (Signed)
[facility]  Doing well. Pathology with epidermal cyst.   Will see soon in clinic.   [name], MD [facility] [address] [place], [place] [address] [phone] (office)

## 2018-04-02 ENCOUNTER — Encounter: Payer: Self-pay | Admitting: General Surgery

## 2018-04-02 ENCOUNTER — Ambulatory Visit (INDEPENDENT_AMBULATORY_CARE_PROVIDER_SITE_OTHER): Payer: Self-pay | Admitting: General Surgery

## 2018-04-02 VITALS — BP 112/69 | HR 54 | Temp 97.5°F | Resp 16 | Wt 209.0 lb

## 2018-04-02 DIAGNOSIS — L723 Sebaceous cyst: Secondary | ICD-10-CM

## 2018-04-02 NOTE — Progress Notes (Signed)
[facility] Note   HPI:  67 y.o. Male presents to clinic for post-op follow-up evaluation after excision of a cyst from his back. Patient reports he is doing well. No drainage. Some itching.   Review of Systems:  No fevers or chills No redness or drainage Itching  All other review of systems: otherwise negative   Pathology: Diagnosis Cyst, excision, back - BENIGN EPIDERMAL INCLUSION CYST.  Vital Signs:  BP 112/69 (BP Location: Left Arm, Patient Position: Sitting, Cuff Size: Normal)   Pulse (!) 54   Temp (!) 97.5 F (36.4 C) (Temporal)   Resp 16   Wt 209 lb (94.8 kg)   BMI 29.99 kg/m    Physical Exam:  Physical Exam  Constitutional: He appears well-developed.  HENT:  Head: Normocephalic.  Cardiovascular: Normal rate.  Pulmonary/Chest: Effort normal.  Back with incision/ healed, suture removed, steristrips applied, no erythema or drainage  Vitals reviewed.   Laboratory studies: None   Imaging:  None   Assessment:  67 y.o. yo Male s/p excision of cyst on the back. Doing well.   Plan:  - Sutures removed and steris applied   All of the above recommendations were discussed with the patient, and all of patient's questions were answered to his expressed satisfaction.  [name], MD [facility] [address] [place], [place] [address] [phone] (office)

## 2018-04-02 NOTE — Patient Instructions (Signed)
Keep dry dressing over area for next 2 days. Strips will fall off after a few days.

## 2018-05-08 ENCOUNTER — Telehealth: Payer: Self-pay | Admitting: Cardiology

## 2018-05-08 DIAGNOSIS — I712 Thoracic aortic aneurysm, without rupture, unspecified: Secondary | ICD-10-CM

## 2018-05-08 NOTE — Telephone Encounter (Signed)
I will forward to Luckey for his opinion

## 2018-05-08 NOTE — Telephone Encounter (Signed)
Patient is going to [place].If by car he travels over 2 days, by plane , it is a 2 hour flight

## 2018-05-08 NOTE — Telephone Encounter (Signed)
I place another referral and asked patient to please make apt

## 2018-05-08 NOTE — Telephone Encounter (Signed)
I saw him in April.  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  He also has a bicuspid valve with mild aortic stenosis.  He has been clinically stable and I would not necessarily anticipate any travel restrictions by air.  He has not however had formal consultation with [facility] (was to see Dr. Servando Snare) which has been recommended a number of times.  Please make sure that a referral has been made and encourage him to keep this appointment prior to his travels.

## 2018-06-02 ENCOUNTER — Institutional Professional Consult (permissible substitution) (INDEPENDENT_AMBULATORY_CARE_PROVIDER_SITE_OTHER): Payer: Medicare HMO | Admitting: Cardiothoracic Surgery

## 2018-06-02 VITALS — BP 127/80 | HR 56 | Resp 20 | Ht 70.0 in | Wt 209.0 lb

## 2018-06-02 DIAGNOSIS — I712 Thoracic aortic aneurysm, without rupture, unspecified: Secondary | ICD-10-CM

## 2018-06-02 DIAGNOSIS — Q231 Congenital insufficiency of aortic valve: Secondary | ICD-10-CM

## 2018-06-02 NOTE — Patient Instructions (Signed)

## 2018-06-02 NOTE — Progress Notes (Signed)
[address]       [place] [address]             [phone]                    Lawrence [name] [place] Medical Record #[id number] Date of Birth: 11/14/1950  Referring: Satira Sark, MD Primary Care: [name], MD Primary Cardiologist: [name], MD  Chief Complaint:    Chief Complaint  Patient presents with  . Thoracic Aortic Aneurysm    Surgical eval, ECHO 02/24/18, CTA Chest 02/24/18    History of Present Illness:    Lawrence [name] 67 y.o. male is seen in the office for follow-up evaluation of dilated ascending aorta and bicuspid aortic valve.  The patient notes that in the spring of 2018 he was noted to have a soft murmur. Echocardiogram done at that time showed a bicuspid aortic valve.  In October 2018 dialysis the patient had some chest discomfort and was seen in the emergency room CTA of the chest was done that showed a dilated ascending aorta to 5.0 cm without evidence of dissection.  He was seen in the surgery office after this episode as recommended to him to proceed with cardiac catheterization as a preop evaluation for possible root replacement and ascending aorta replacement in the face of a dilated ascending aorta with a bicuspid valve.  Cardiac catheterization was performed, as he was told all was okay so he assumed he did not need to return.   A follow-up CTA scan was done in the spring 2019 by cardiology  Patient has a history of hypertension but otherwise no significant cardiac history.    patient does have a family history of her brother who at age 58 died suddenly at home found outside of the shower no autopsy was done    Current Activity/ Functional Status:  Patient is independent with mobility/ambulation, transfers, ADL's, IADL's.  Patient is disabled from chronic back pain     Current Activity/ Functional Status:  Patient is independent with mobility/ambulation, transfers, ADL's, IADL's.   Zubrod  Score: At the time of surgery this patient's most appropriate activity status/level should be described as: [x]     0    Normal activity, no symptoms []     1    Restricted in physical strenuous activity but ambulatory, able to do out light work []     2    Ambulatory and capable of self care, unable to do work activities, up and about               >50 % of waking hours                              []     3    Only limited self care, in bed greater than 50% of waking hours []     4    Completely disabled, no self care, confined to bed or chair []     5    Moribund   Past Medical History:  Diagnosis Date  . Aortic atherosclerosis ([place])   . Aortic insufficiency   . Aortic stenosis   . Arthritis   . Bicuspid aortic valve    a. mild AS, mild-mod AI by echo 03/2017, 5cm thoracic aortic aneurysm by CT 07/2017.  . Essential hypertension   . GERD (gastroesophageal reflux disease)   . Gout   .  Heart murmur   . Thoracic aortic aneurysm ([place])    a. 07/2017: 5cm by CT.    Past Surgical History:  Procedure Laterality Date  . COLONOSCOPY N/A 08/29/2016   Procedure: COLONOSCOPY;  Surgeon: Danie Binder, MD;  [facility];  Service: Endoscopy;  Laterality: N/A;  1030  . COLONOSCOPY N/A 03/09/2018   Procedure: COLONOSCOPY;  Surgeon: Danie Binder, MD;  Location: [facility];  Service: Endoscopy;  Laterality: N/A;  1:15pm  . HERNIA REPAIR     umbilical  . MASS EXCISION N/A 03/18/2018   Procedure: EXCISION CYST ON BACK 2CM;  Surgeon: [name], MD;  Location: [facility];  Service: General;  Laterality: N/A;  . POLYPECTOMY  03/09/2018   Procedure: POLYPECTOMY;  Surgeon: Danie Binder, MD;  Location: [facility];  Service: Endoscopy;;  ascending, transverse x2; descending, sigmoid  . RIGHT/LEFT HEART CATH AND CORONARY ANGIOGRAPHY N/A 09/05/2017   Procedure: RIGHT/LEFT HEART CATH AND CORONARY ANGIOGRAPHY;  Surgeon: Larey Dresser, MD;  Location: [facility];  Service:  Cardiovascular;  Laterality: N/A;    Family History  Problem Relation Age of Onset  . Colon polyps Sister   . Heart attack Mother   . Stomach cancer Father   . Colon cancer Neg Hx      Social History   Tobacco Use  Smoking Status Former Smoker  . Packs/day: 2.50  . Years: 25.00  . Pack years: 62.50  . Types: Cigarettes  . Last attempt to quit: 08/02/1991  . Years since quitting: 26.8  Smokeless Tobacco Never Used    Social History   Substance and Sexual Activity  Alcohol Use Yes   Comment: Occasionally, with sporting events up to 4 at a time.     Allergies  Allergen Reactions  . Ciprofloxacin   . Morphine And Related Other (See Comments)    Caused patient to pass out.     Current Outpatient Medications  Medication Sig Dispense Refill  . allopurinol (ZYLOPRIM) 300 MG tablet Take 300 mg by mouth daily as needed (gout flare).     Marland Kitchen amLODipine (NORVASC) 5 MG tablet Take 1 tablet (5 mg total) by mouth daily. 90 tablet 1  . calcium carbonate (TUMS - DOSED IN MG ELEMENTAL CALCIUM) 500 MG chewable tablet Chew 1-2 tablets by mouth 2 (two) times daily as needed for indigestion or heartburn (for stomach pain due to spicy foods).     . cyclobenzaprine (FLEXERIL) 5 MG tablet Take 5 mg by mouth at bedtime as needed for muscle spasms.     Marland Kitchen docusate sodium (COLACE) 100 MG capsule Take 1 capsule (100 mg total) by mouth 2 (two) times daily. 60 capsule 2  . ibuprofen (ADVIL,MOTRIN) 600 MG tablet Take 600 mg by mouth 3 (three) times daily as needed for moderate pain.     Marland Kitchen lisinopril (PRINIVIL,ZESTRIL) 40 MG tablet Take 40 mg by mouth daily.    . metoprolol tartrate (LOPRESSOR) 25 MG tablet Take 1 tablet (25 mg total) by mouth 2 (two) times daily. (Patient taking differently: Take 25 mg by mouth daily. ) 180 tablet 1  . nitroGLYCERIN (NITROSTAT) 0.3 MG SL tablet Place 0.3 mg under the tongue every 5 (five) minutes as needed for chest pain.     No current facility-administered  medications for this visit.     Pertinent items are noted in HPI.   Review of Systems:     Cardiac Review of Systems: [Y] = yes  or   [ N ] = no   Chest Pain [   n ]  Resting SOB [ n  ] Exertional SOB  [n  ]  Orthopnea [ n ]   Pedal Edema [n ]    Palpitations Florencio.Farrier  ] Syncope  [ n ]   Presyncope [n   ]   General Review of Systems: [Y] = yes [  ]=no Constitional: recent weight change [  ];  Wt loss over the last 3 months [   ] anorexia [  ]; fatigue [  ]; nausea [  ]; night sweats [  ]; fever [  ]; or chills [  ];           Eye : blurred vision [  ]; diplopia [   ]; vision changes [  ];  Amaurosis fugax[  ]; Resp: cough [  ];  wheezing[  ];  hemoptysis[  ]; shortness of breath[  ]; paroxysmal nocturnal dyspnea[  ]; dyspnea on exertion[  ]; or orthopnea[  ];  GI:  gallstones[  ], vomiting[  ];  dysphagia[  ]; melena[  ];  hematochezia [  ]; heartburn[  ];   Hx of  Colonoscopy[  ]; GU: kidney stones [  ]; hematuria[  ];   dysuria [  ];  nocturia[  ];  history of     obstruction [  ]; urinary frequency [  ]             Skin: rash, swelling[  ];, hair loss[  ];  peripheral edema[  ];  or itching[  ]; Musculosketetal: myalgias[  ];  joint swelling[  ];  joint erythema[  ];  joint pain[  ];  back pain[  ];  Heme/Lymph: bruising[  ];  bleeding[  ];  anemia[  ];  Neuro: TIA[  ];  headaches[  ];  stroke[  ];  vertigo[  ];  seizures[  ];   paresthesias[  ];  difficulty walking[  ];  Psych:depression[  ]; anxiety[  ];  Endocrine: diabetes[  ];  thyroid dysfunction[  ];  Immunizations: Flu up to date [  ]; Pneumococcal up to date [  ];  Other:             Dental: Patient has upper dentures, all teeth have been removed     PHYSICAL EXAMINATION: BP 127/80   Pulse (!) 56   Resp 20   Ht 5\' 10"  (1.778 m)   Wt 209 lb (94.8 kg)   SpO2 96% Comment: RA  BMI 29.99 kg/m  General appearance: alert and cooperative Head: Normocephalic, without obvious abnormality, atraumatic Neck: no adenopathy, no  carotid bruit, no JVD, supple, symmetrical, trachea midline and thyroid not enlarged, symmetric, no tenderness/mass/nodules Lymph nodes: Cervical, supraclavicular, and axillary nodes normal. Resp: clear to auscultation bilaterally Back: symmetric, no curvature. ROM normal. No CVA tenderness. Cardio: regular rate and rhythm and systolic murmur: early systolic 2/6, crescendo at 2nd right intercostal space GI: soft, non-tender; bowel sounds normal; no masses,  no organomegaly Extremities: extremities normal, atraumatic, no cyanosis or edema and Homans sign is negative, no sign of DVT Neurologic: Grossly normal Patient has no signs or symptoms of Marfan syndrome, negative thumb/fist sign  Diagnostic Studies & Laboratory data:      Recent Lab Findings: Lab Results  Component Value Date   WBC 3.9 (L) 03/11/2018   HGB 14.3 03/11/2018   HCT 44.8 03/11/2018   PLT 195 03/11/2018  GLUCOSE 113 (H) 03/11/2018   CHOL 193 09/02/2017   TRIG 133 09/02/2017   HDL 37 (L) 09/02/2017   LDLCALC 129 (H) 09/02/2017   ALT 28 09/02/2017   AST 22 09/02/2017   NA 138 03/11/2018   K 4.5 03/11/2018   CL 105 03/11/2018   CREATININE 1.14 03/11/2018   BUN 17 03/11/2018   CO2 26 03/11/2018   TSH 1.304 09/02/2017   INR 1.06 09/02/2017   HGBA1C 6.0 (H) 09/02/2017   CATH: 09/05/2017  Fick Cardiac Output 4.76 L/min  Fick Cardiac Output Index 2.23 (L/min)/BSA  RA A Wave 8 mmHg  RA V Wave 7 mmHg  RA Mean 5 mmHg  RV Systolic Pressure 29 mmHg  RV Diastolic Pressure 1 mmHg  RV EDP 7 mmHg  PA Systolic Pressure 30 mmHg  PA Diastolic Pressure 11 mmHg  PA Mean 19 mmHg  PW A Wave 18 mmHg  PW V Wave 15 mmHg  PW Mean 13 mmHg  AO Systolic Pressure 025 mmHg  AO Diastolic Pressure 83 mmHg  AO Mean 112 mmHg  QP/QS 1  TPVR Index 8.5 HRUI  TSVR Index 50.09 HRUI  PVR SVR Ratio 0.06  TPVR/TSVR Ratio 0.17   Diagnostic  Dominance: Right  Left Main  No significant coronary disease.  Left Anterior Descending    No significant coronary disease.  Ramus Intermedius  Moderate ramus. No significant coronary disease.  Left Circumflex  No significant coronary disease.  Right Coronary Artery  No significant coronary disease.  Intervention   No interventions have been documented.         ECHO: ATTENDING    [name], M.D.  ORDERING     [name], M.D.  REFERRING    [name], M.D.  SONOGRAPHER  [facility]  PERFORMING   [facility], Deneise Lever [place]  cc:  ------------------------------------------------------------------- LV EF: 55% -   60%  ------------------------------------------------------------------- Indications:      Aortic stenosis 424.1.  ------------------------------------------------------------------- History:   PMH:  Former Smoker. Thoracic aortic aneurysm  Risk factors:  Hypertension.  ------------------------------------------------------------------- Study Conclusions  - Left ventricle: The cavity size was normal. Wall thickness was   increased in a pattern of moderate LVH. Systolic function was   normal. The estimated ejection fraction was in the range of 55%   to 60%. The study is not technically sufficient to allow   evaluation of LV diastolic function. - Aortic valve: Probable bicuspid aortic valve. There was mild to   moderate regurgitation. Valve area (VTI): 1.8 cm^2. Valve area   (Vmax): 1.73 cm^2. Valve area (Vmean): 2.48 cm^2. - Aorta: The visualized portion of the proximal ascending aorta is   moderately enlarged at 4.5 cm. Aortic root dimension: 41 mm (ED). - Aortic root: The aortic root was mildly dilated. - Technically adequate study.  ------------------------------------------------------------------- Study data:  Comparison was made to the study of 03/13/2017.  Study status:  Routine.  Procedure:  Transthoracic echocardiography. Image quality was adequate.          Transthoracic echocardiography.  M-mode, complete 2D, spectral  Doppler, and color Doppler.  Birthdate:  Patient birthdate: [birth year].  Age:  Patient is 67 yr old.  Sex:  Gender: male.    BMI: 29.6 kg/m^2.  Patient status:  Outpatient.  Study date:  Study date: 02/24/2018. Study time: 10:29 AM.  Location:  Echo laboratory.  -------------------------------------------------------------------  ------------------------------------------------------------------- Left ventricle:  The cavity size was normal. Wall thickness was increased in a pattern of moderate LVH. Systolic function was normal.  The estimated ejection fraction was in the range of 55% to 60%. Images were inadequate for LV wall motion assessment. The study is not technically sufficient to allow evaluation of LV diastolic function.  ------------------------------------------------------------------- Aortic valve:   Normal thickness leaflets. Probable bicuspid aortic valve.  Doppler:   There was no stenosis.   There was mild to moderate regurgitation.    VTI ratio of LVOT to aortic valve: 0.52. Valve area (VTI): 1.8 cm^2. Indexed valve area (VTI): 0.83 cm^2/m^2. Peak velocity ratio of LVOT to aortic valve: 0.5. Valve area (Vmax): 1.73 cm^2. Indexed valve area (Vmax): 0.8 cm^2/m^2. Mean velocity ratio of LVOT to aortic valve: 0.72. Valve area (Vmean): 2.48 cm^2. Indexed valve area (Vmean): 1.14 cm^2/m^2. Mean gradient (S): 9 mm Hg. Peak gradient (S): 17 mm Hg.  ------------------------------------------------------------------- Aorta:  The visualized portion of the proximal ascending aorta is moderately enlarged at 4.5 cm. Aortic root: The aortic root was mildly dilated.  ------------------------------------------------------------------- Mitral valve:   Normal thickness leaflets .  Doppler:   There was no evidence for stenosis.   There was no significant regurgitation.   ------------------------------------------------------------------- Left atrium:  The atrium was normal in  size.  ------------------------------------------------------------------- Atrial septum:  Poorly visualized.  ------------------------------------------------------------------- Right ventricle:  The cavity size was normal. Systolic function was normal.  ------------------------------------------------------------------- Pulmonic valve:   Not well visualized.  Doppler:   There was no evidence for stenosis.   There was no significant regurgitation.   ------------------------------------------------------------------- Tricuspid valve:   Normal thickness leaflets.  Doppler:   There was no evidence for stenosis.   There was no significant regurgitation.   ------------------------------------------------------------------- Pulmonary artery:    Systolic pressure could not be accurately estimated.   Inadequate TR jet.  ------------------------------------------------------------------- Right atrium:  The atrium was normal in size.  ------------------------------------------------------------------- Pericardium:  There was no pericardial effusion.  ------------------------------------------------------------------- Systemic veins:  IVC poorly visualized.  ------------------------------------------------------------------- Post procedure conclusions Ascending Aorta:  - The visualized portion of the proximal ascending aorta is   moderately enlarged at 4.5 cm.  ------------------------------------------------------------------- Measurements   Left ventricle                           Value           Reference  LV ID, ED, PLAX chordal                  44     mm       43 - 52  LV ID, ES, PLAX chordal                  30     mm       23 - 38  LV fx shortening, PLAX chordal           32     %        >=29  LV PW thickness, ED                      13.2   mm       ---------  IVS/LV PW ratio, ED                      1.02            <=1.3    Ventricular septum  Value           Reference  IVS thickness, ED                        13.5   mm       ---------    LVOT                                     Value           Reference  LVOT ID, S                               21     mm       ---------  LVOT area                                3.46   cm^2     ---------  LVOT peak velocity, S                    103.84 cm/s     ---------  LVOT mean velocity, S                    100    cm/s     ---------  LVOT VTI, S                              26.98  cm       ---------  Stroke volume (SV), LVOT DP              93.5   ml       ---------  Stroke index (SV/bsa), LVOT DP           43     ml/m^2   ---------    Aortic valve                             Value           Reference  Aortic valve peak velocity, S            206.31 cm/s     ---------  Aortic valve mean velocity, S            139.35 cm/s     ---------  Aortic valve VTI, S                      51.47  cm       ---------  Aortic mean gradient, S                  9      mm Hg    ---------  Aortic peak gradient, S                  17     mm Hg    ---------  VTI ratio, LVOT/AV                       0.52            ---------  Aortic valve area, VTI  1.8    cm^2     ---------  Aortic valve area/bsa, VTI               0.83   cm^2/m^2 ---------  Velocity ratio, peak, LVOT/AV            0.5             ---------  Aortic valve area, peak velocity         1.73   cm^2     ---------  Aortic valve area/bsa, peak              0.8    cm^2/m^2 ---------  velocity  Velocity ratio, mean, LVOT/AV            0.72            ---------  Aortic valve area, mean velocity         2.48   cm^2     ---------  Aortic valve area/bsa, mean              1.14   cm^2/m^2 ---------  velocity  Aortic regurg pressure half-time         633    ms       ---------    Aorta                                    Value           Reference  Aortic root ID, ED                       41     mm       ---------    Left atrium                               Value           Reference  LA ID, A-P, ES                           37     mm       ---------  LA volume/bsa, S                         27.9   ml/m^2   ---------    Right ventricle                          Value           Reference  TAPSE                                    17.3   mm       ---------  RV s&', lateral, S                        11.1   cm/s     ---------  Legend: (L)  and  (H)  mark values outside specified reference range.  ------------------------------------------------------------------- Prepared and Electronically Authenticated by  Kerry Hough, M.D. 2019-04-16T14:32:51       CLINICAL DATA:  Ascending thoracic aortic aneurysm  EXAM:  CT ANGIOGRAPHY CHEST WITH CONTRAST  TECHNIQUE: Multidetector CT imaging of the chest was performed using the standard protocol during bolus administration of intravenous contrast. Multiplanar CT image reconstructions and MIPs were obtained to evaluate the vascular anatomy.  CONTRAST:  151mL ISOVUE-370 IOPAMIDOL (ISOVUE-370) INJECTION 76%  COMPARISON:  08/04/2017  FINDINGS: Cardiovascular: Ascending thoracic aorta has a stable fusiform aneurysm measuring 5 cm in transaxial dimension. Sino-tubular junction measures 3.4 cm. Sinus of Valsalva measures 4.6 cm. Patent 3 vessel arch anatomy. Negative for dissection or mediastinal hemorrhage. Normal heart size. No pericardial effusion. No large central pulmonary embolus.  Mediastinum/Nodes: No enlarged mediastinal, hilar, or axillary lymph nodes. Thyroid gland, trachea, and esophagus demonstrate no significant findings.  Lungs/Pleura: Minor apical paraseptal emphysema dependent bibasilar atelectasis. No focal pneumonia, collapse or consolidation. Negative for edema or interstitial process. No pleural abnormality, effusion or pneumothorax. Trachea and central airways are patent.  Upper Abdomen: No acute abnormality.  Musculoskeletal: No acute  osseous finding.  Review of the MIP images confirms the above findings.  IMPRESSION: Stable 5 cm ascending thoracic aorta fusiform aneurysm.  Recommend semi-annual imaging followup by CTA or MRA and referral to cardiothoracic surgery if not already obtained. This recommendation follows 2010 ACCF/AHA/AATS/ACR/ASA/SCA/SCAI/SIR/STS/SVM Guidelines for the Diagnosis and Management of Patients With Thoracic Aortic Disease. Circulation. 2010; 121: e266-e36  No acute intrathoracic process.  Bibasilar atelectasis/scarring  Emphysema (ICD10-J43.9).   Electronically Signed   By: Jerilynn Mages.  Shick M.D.   On: 02/24/2018 14:00   I have independently reviewed the above radiology studies  and reviewed the findings with the patient.    CLINICAL DATA:  Non radiating LEFT chest pain for 30 minutes. Flu symptoms. History of hypertension.  EXAM: CT ANGIOGRAPHY CHEST, ABDOMEN AND PELVIS  TECHNIQUE: Multidetector CT imaging through the chest, abdomen and pelvis was performed using the standard protocol during bolus administration of intravenous contrast. Multiplanar reconstructed images and MIPs were obtained and reviewed to evaluate the vascular anatomy.  CONTRAST:  100 cc Isovue 370  COMPARISON:  Chest radiograph August 04, 2017 at 1610 hours an CT abdomen and pelvis June 17, 2013  FINDINGS: CTA CHEST FINDINGS  CARDIOVASCULAR: Ascending aorta is 5 cm in transaxial dimension. Mild calcific atherosclerosis. No intrinsic density on noncontrast CT. Homogeneous contrast opacification of thoracic aorta without dissection, aneurysm, luminal irregularity, periaortic fluid collections, or contrast extravasation. Heart size is normal. Mild coronary artery calcification. No pericardial effusion. No central pulmonary embolism.  MEDIASTINUM/NODES: No mediastinal mass or lymphadenopathy by CT size criteria. Mildly patulous thoracic esophagus with air-fluid level associated with  reflux.  LUNGS/PLEURA: Tracheobronchial tree is patent, no pneumothorax. Mild centrilobular emphysema. Apical bullous changes. No pleural effusions, focal consolidations, pulmonary nodules or masses. Bibasilar bandlike densities.  MUSCULOSKELETAL: Non-suspicious.  Review of the MIP images confirms the above findings.  CTA ABDOMEN AND PELVIS FINDINGS  VASCULAR  Aorta: Abdominal aorta is normal caliber. Mildly tortuous course. Mild calcific atherosclerosis Homogeneous contrast opacification of aortoiliac vessels without dissection, aneurysm, luminal irregularity, periaortic fluid collections, or contrast extravasation.  Celiac: Patent.  SMA: Patent.  Renals: Patent.  IMA: Patent.  Inflow: Negative.  Veins: Negative, not tailored for evaluation.  Review of the MIP images confirms the above findings.  NON-VASCULAR  HEPATOBILIARY: Liver and gallbladder are normal.  PANCREAS: Normal.  SPLEEN: Normal considering arterial phase.  ADRENALS/URINARY TRACT: Kidneys are orthotopic, demonstrating symmetric enhancement. No nephrolithiasis, hydronephrosis or solid renal masses. The unopacified ureters are normal in course and caliber. Urinary bladder is well distended and unremarkable. Normal adrenal glands.  STOMACH/BOWEL: The stomach, small and large bowel are normal in course and caliber without inflammatory changes, sensitivity decreased without oral contrast. Mild colonic diverticulosis. Normal appendix.  VASCULAR/LYMPHATIC: No lymphadenopathy by CT size criteria.  REPRODUCTIVE: Mild prostatomegaly.  OTHER: No intraperitoneal free fluid or free air.  MUSCULOSKELETAL: Nonacute. Small fat containing LEFT greater than RIGHT inguinal hernias. Subcentimeter sclerotic presumed bone island LEFT ischial tuberosity. Broad thoracolumbar dextroscoliosis.  Review of the MIP images confirms the above findings.  IMPRESSION: CTA CHEST:  1. 5 cm  fusiform aneurysm ascending aorta. Recommend semi-annual imaging followup by CTA or MRA and referral to cardiothoracic surgery if not already obtained. This recommendation follows 2010 ACCF/AHA/AATS/ACR/ASA/SCA/SCAI/SIR/STS/SVM Guidelines for the Diagnosis and Management of Patients With Thoracic Aortic Disease. Circulation. 2010; 121: B510-C585. 2. Mild emphysema.  Bibasilar atelectasis/scarring.  CTA ABDOMEN AND PELVIS:  1. No acute vascular process or or acute intra-abdominal/pelvic disease. 2. Mild colonic diverticulosis. Aortic Atherosclerosis (ICD10-I70.0), Emphysema (ICD10-J43.9), Aortic aneurysm NOS (ICD10-I71.9).   Electronically Signed   By: Elon Alas M.D.   On: 08/04/2017 17:34        Assessment / Plan:   5 cm ascending aorta without significant coronary artery disease and a bicuspid aortic valve with some sclerosis and mild insufficiency. I discussed with the patient proceeding a sending aorta the aortic root and aortic valve replacement, with the size of 5 cm of bicuspid valve a positive family history of a brother who died suddenly in a young age possible aortic dissection.  Risks and options of surgery were discussed in detail.  We discussed use of pericardial versus mechanical valves.  Patient prefers not to take Coumadin.   He would like to wait to proceed with surgery after he returns from [place] to visit his children and grandchildren.  He will return to see me in the office on his return and we we will then schedule surgery.    The [employer] [facility]) and the [facility]) have issued a statement to clarify 2 previous guidelines from the [facility], [place], and collaborating societies addressing the risk of aortic dissection in patients with bicuspid aortic valves (BAV) and severe aortic enlargement. The 2 guidelines differ with regard to the recommended threshold of aortic root or ascending aortic dilatation that  would justify surgical intervention in patients with bicuspid aortic valves. This new statement of clarification uses the ACC/AHA revised structure for delineating the Class of Recommendation and Level of Evidence to provide recommendation that replace those contained in Section 9.2.2.1 of the thoracic aortic disease guidelines and Section 5.1.3 of the valvular heart disease guideline. New recommendations in intervention in patients with BAV and dilatation of the aortic root (sinuses) or ascending aorta include:  . Operative intervention to repair or replace the aortic root (sinuses) or replace the ascending aorta is indicated in asymptomatic patients with BAV if the diameter of the aortic root or ascending aorta is 5.5 cm or greater. (Class of recommendation 1, Level of evidence B-NR).  Marland Kitchen Operative intervention to repair or replace the aortic root (sinuses) or replace the ascending aorta is reasonable in asymptomatic patients with BAV if the diameter of the aortic root or ascending aorta is 5.0 cm or greater and an additional risk factor for dissection is present or if the patient is at low surgical risk and the surgery is performed by an experienced aortic surgical team in a center with established expertise in these procedures. (Class of recommendation IIa; Level of Evidence B-NR).  . Replacement  of the ascending aorta is reasonable in patients with BAV undergoing AVR because of severe aortic stenosis or aortic regurgitation when the diameter of the ascending aorta is greater than 4.5 cm (Class of recommendation IIa; Level of evidence C-EO).  Citation: Donnamarie Poag, Randolm Idol, et al. Surgery for aortic dilatation in patients with bicuspid aortic valves. A statement of clarification from the [employer]/American Heart Association Task Force on Clinical Practice Guidelines. [Published online ahead of print October 14, 2014]. Circulation. doi: 10.1161/CIR.0000000000000331.     I  spent 40 minutes with  the patient face to face and greater then 50% of the time was spent in counseling and coordination of care.    [name] MD      [address] [place] [address] Office [phone]   Beeper [phone]  06/03/2018 4:54 PM

## 2018-07-02 ENCOUNTER — Other Ambulatory Visit: Payer: Self-pay | Admitting: *Deleted

## 2018-07-02 ENCOUNTER — Ambulatory Visit (INDEPENDENT_AMBULATORY_CARE_PROVIDER_SITE_OTHER): Payer: Medicare HMO | Admitting: Cardiothoracic Surgery

## 2018-07-02 ENCOUNTER — Encounter: Payer: Self-pay | Admitting: Cardiothoracic Surgery

## 2018-07-02 ENCOUNTER — Other Ambulatory Visit: Payer: Self-pay

## 2018-07-02 VITALS — BP 129/90 | HR 75 | Resp 18 | Ht 70.0 in | Wt 212.0 lb

## 2018-07-02 DIAGNOSIS — Q2381 Bicuspid aortic valve: Secondary | ICD-10-CM

## 2018-07-02 DIAGNOSIS — I351 Nonrheumatic aortic (valve) insufficiency: Secondary | ICD-10-CM

## 2018-07-02 DIAGNOSIS — I350 Nonrheumatic aortic (valve) stenosis: Secondary | ICD-10-CM

## 2018-07-02 DIAGNOSIS — Q2543 Congenital aneurysm of aorta: Secondary | ICD-10-CM

## 2018-07-02 DIAGNOSIS — Q231 Congenital insufficiency of aortic valve: Secondary | ICD-10-CM | POA: Diagnosis not present

## 2018-07-02 DIAGNOSIS — I712 Thoracic aortic aneurysm, without rupture, unspecified: Secondary | ICD-10-CM

## 2018-07-02 DIAGNOSIS — I719 Aortic aneurysm of unspecified site, without rupture: Secondary | ICD-10-CM

## 2018-07-02 DIAGNOSIS — I7121 Aneurysm of the ascending aorta, without rupture: Secondary | ICD-10-CM

## 2018-07-02 NOTE — Patient Instructions (Addendum)
September 8 stop Lisinopril Take only metoprolol on morning of surgery Stop advil , motrin now          Ascending Aortic Aneurysm/ Thoracic Aortic Aneurysm   Recent studies have raised concern that fluoroquinolone antibiotics could be associated with an increased risk of aortic aneurysm or aortic dissection. You should avoid use of Cipro and other associated antibiotics (flouroquinolone antibiotics )  It is  best to avoid activities that cause grunting or straining (medically referred to as a "valsalva maneuver"). This happens when a person bears down against a closed throat to increase the strength of arm or abdominal muscles. There's often a tendency to do this when lifting heavy weights, doing sit-ups, push-ups or chin-ups, etc., but it may be harmful.     An aneurysm is a bulge in an artery. It happens when blood pushes up against a weakened or damaged artery wall. A thoracic aortic aneurysm is an aneurysm that occurs in the first part of the aorta, between the heart and the diaphragm. The aorta is the main artery of the body. It supplies blood from the heart to the rest of the body. Some aneurysms may not cause symptoms or problems. However, the major concern with a thoracic aortic aneurysm is that it can enlarge and burst (rupture), or blood can flow between the layers of the wall of the aorta through a tear (aorticdissection). Both of these conditions can cause bleeding inside the body and can be life-threatening if they are not diagnosed and treated right away. What are the causes? The exact cause of this condition is not known. What increases the risk? The following factors may make you more likely to develop this condition:  Being age 43 or older.  Having a hardening of the arteries caused by the buildup of fat and other substances in the lining of a blood vessel (arteriosclerosis).  Having inflammation of the walls of an artery (arteritis).  Having a genetic disease that  weakens the body's connective tissue, such as Marfan syndrome.  Having an injury or trauma to the aorta.  Having an infection that is caused by bacteria, such as syphilis or staphylococcus, in the wall of the aorta (infectious aortitis).  Having high blood pressure (hypertension).  Being male.  Being white (Caucasian).  Having high cholesterol.  Having a family history of aneurysms.  Using tobacco.  Having chronic obstructive pulmonary disease (COPD). What are the signs or symptoms? Symptoms of this condition vary depending on the size and rate of growth of the aneurysm. Most grow slowly and do not cause any symptoms. When symptoms do occur, they may include:  Pain in the chest, back, sides, or abdomen. The pain may vary in intensity. A sudden onset of severe pain may indicate that the aneurysm has ruptured.  Hoarseness.  Cough.  Shortness of breath.  Swallowing problems.  Swelling in the face, arms, or legs.  Fever.  Unexplained weight loss. How is this diagnosed? This condition may be diagnosed with:  An ultrasound.  X-rays.  A CT scan.  An MRI.  Tests to check the arteries for damage or blockages (angiogram). Most unruptured thoracic aortic aneurysms cause no symptoms, so they are often found during exams for other conditions. How is this treated? Treatment for this condition depends on:  The size of the aneurysm.  How fast the aneurysm is growing.  Your age.  Risk factors for rupture. Aneurysms that are smaller than 2.2 inches (5.5 cm) may be managed by using  medicines to control blood pressure, manage pain, or fight infection. You may need regular monitoring to see if the aneurysm is getting bigger. Your health care provider may recommend that you have an ultrasound every year or every 6 months. How often you need to have an ultrasound depends on the size of the aneurysm, how fast it is growing, and whether you have a family history of  aneurysms. Surgical repair may be needed if your aneurysm is larger than 2.2 inches or if it is growing quickly. Follow these instructions at home: Eating and drinking   Eat a healthy diet. Your health care provider may recommend that you:  Lower your salt (sodium) intake. In some people, too much salt can raise blood pressure and increase the risk of thoracic aortic aneurysm.  Avoid foods that are high in saturated fat and cholesterol, such as red meat and dairy.  Eat a diet that is low in sugar.  Increase your fiber intake by including whole grains, vegetables, and fruits in your diet. Eating these foods may help to lower blood pressure.  Limit or avoid alcohol as recommended by your health care provider. Lifestyle   Follow instructions from your health care provider about healthy lifestyle habits. Your health care provider may recommend that you:  Do not use any products that contain nicotine or tobacco, such as cigarettes and e-cigarettes. If you need help quitting, ask your health care provider.  Keep your blood pressure within normal limits. The target limit for most people is below 120/80. Check your blood pressure regularly. If it is high, ask your health care provider about ways that you can control it.  Keep your blood sugar (glucose) level and cholesterol levels within normal limits. Target limits for most people are:  Blood glucose level: Less than 100 mg/dL.  Total cholesterol level: Less than 200 mg/dL.  Maintain a healthy weight. Activity   Stay physically active and exercise regularly. Talk with your health care provider about how often you should exercise and ask which types of exercise are safe for you.  Avoid heavy lifting and activities that take a lot of effort (are strenuous). Ask your health care provider what activities are safe for you. General instructions   Keep all follow-up visits as told by your health care provider. This is important.  Talk with  your health care provider about regular screenings to see if the aneurysm is getting bigger.  Take over-the-counter and prescription medicines only as told by your health care provider. Contact a health care provider if:  You have discomfort in your upper back, neck, or abdomen.  You have trouble swallowing.  You have a cough or hoarseness.  You have a family history of aneurysms.  You have unexplained weight loss. Get help right away if:  You have sudden, severe pain in your upper back and abdomen. This pain may move into your chest and arms.  You have shortness of breath.  You have a fever. This information is not intended to replace advice given to you by your health care provider. Make sure you discuss any questions you have with your health care provider. Document Released: 10/28/2005 Document Revised: 08/09/2016 Document Reviewed: 08/09/2016 Elsevier Interactive Patient Education  2017 [place].   Aortic Dissection An aortic dissection happens when there is a tear in the main blood vessel of the body (aorta). The aorta comes out of the heart, curves around, and then goes down the chest (thoracic aorta) and into the abdomen (abdominal  aorta) to supply arteries with blood. The wall of the aorta has inner and outer layers. Aortic dissection occurs most often in the thoracic aorta. As the tear widens and blood flows through it, the aorta becomes "double-barreled." This means that one part of the aorta continues to carry blood to the body, but blood also flows into the tear, between the layers of the aorta. The torn part of the aorta fills with blood and swells up. This can reduce blood flow through the part of the aorta that is still supplying blood to the body. Aortic dissection is a medical emergency. What are the causes? An aortic dissection is commonly caused by weakening of the artery wall due to high blood pressure. Other causes may include:  An injury, such as from a car  crash.  Birth defects that affect the heart (congenital heart defects).  Thickening of the artery walls. In some cases, the cause is not known. What increases the risk? The following factors may make you more likely to develop this condition:  Having certain medical conditions, such as:  High blood pressure (hypertension).  Hardening and narrowing of the arteries (atherosclerosis).  A genetic disorder that affects the connective tissue, such as Marfan syndrome or Ehlers-Danlos syndrome.  A condition that causes inflammation of blood vessels, such as giant cell arteritis.  Having a chest injury.  Having surgery on the aorta.  Being born with a congenital heart defect.  Being male.  Being older than age 68.  Using cocaine.  Smoking.  Lifting heavy weights or doing other types of high-intensity resistance training. What are the signs or symptoms? Signs and symptoms of aortic dissection start suddenly. The most common symptoms are:  Severe chest pain that may feel like tearing, stabbing, or sharp pain.  Severe pain that spreads (radiates) to the back, neck, jaw, or abdomen. Other symptoms may include:  Trouble breathing.  Dizziness or fainting.  Sudden weakness on one side of the body.  Nausea or vomiting.  Trouble swallowing.  Coughing up blood.  Vomiting blood.  Clammy skin. How is this diagnosed? This condition may be diagnosed based on:  Your symptoms.  A physical exam. This may include:  Listening for abnormal blood flow sounds (murmurs) in your chest or abdomen.  Checking your pulse in your arms and legs.  Checking your blood pressure to see whether it is low or whether there is a difference between the measurements in your right and left arm.  Electrocardiogram (ECG). This test measures the electrical activity in your heart.  Chest X-ray.  CT scan.  MRI.  Aortic angiogram. This test involves injecting dye to make it easier to see your  blood vessels clearly.  Echocardiogram to study your heart using sound waves.  Blood tests. How is this treated? It is important to treat an aortic dissection as quickly as possible. Treatment may start as soon as your health care provider thinks that you have aortic dissection. Treatment depends on the location and severity of your dissection and your overall health. Treatment may include:  Medicines to lower your blood pressure.  Surgery to repair the dissected part of your aorta with artificial material (syntheticgraft).  A medical procedure to insert a stent-graft into the aorta (endovascular procedure). During this procedure, a long, thin tube (stent) is inserted into an artery near the groin (femoral artery) and moved up to the damaged part of the aorta. Then, the stent is opened to help improve blood flow and prevent future dissection.  Follow these instructions at home: Activity   Avoid activities that could injure your chest or your abdomen. Ask your health care provider what activities are safe for you.  After you have recovered, try to stay active. Ask your health care provider what activities are safe for you after recovery.  Do not lift anything that is heavier than 10 lb (4.5 kg) until your health care provider approves.  Do not drive or use heavy machinery while taking prescription pain medicine. Eating and drinking   Eat a heart-healthy diet, which includes lots of fresh fruits and vegetables, low-fat (lean) protein, and whole grains.  Check ingredients and nutrition facts on packaged foods and beverages, and avoid foods with high amounts of:  Salt (sodium).  Saturated fats (like red meat).  Trans fats (like fried food). General instructions   Take over-the-counter and prescription medicines only as told by your health care provider.  Work with your health care provider to manage your blood pressure.  Talk with your health care provider about how to manage  stress.  Do not use any products that contain nicotine or tobacco, such as cigarettes and e-cigarettes. If you need help quitting, ask your health care provider.  Keep all follow-up visits as told by your health care provider. This is important. Get help right away if:  You develop any symptoms of aortic dissection after treatment, including severe pain in your chest, back, or abdomen.  You have a pain in your abdomen.  You have trouble breathing or you develop a cough.  You faint.  You develop a racing heartbeat. These symptoms may represent a serious problem that is an emergency. Do not wait to see if the symptoms will go away. Get medical help right away. Call your local emergency services (911 in the [place] Do not drive yourself to the hospital. Summary  An aortic dissection happens when there is a tear in the main blood vessel of the body (aorta). It is a medical emergency.  The most common symptom is severe pain in the chest that spreads (radiates) to the back, neck, jaw, or abdomen.  It is important to treat an aortic dissection as quickly as possible. Treatment typically includes surgery and medicines. This information is not intended to replace advice given to you by your health care provider. Make sure you discuss any questions you have with your health care provider. Document Released: 02/04/2008 Document Revised: 09/16/2016  Aortic Valve Replacement, Care After Refer to this sheet in the next few weeks. These instructions provide you with information about caring for yourself after your procedure. Your health care provider may also give you more specific instructions. Your treatment has been planned according to current medical practices, but problems sometimes occur. Call your health care provider if you have any problems or questions after your procedure. What can I expect after the procedure? After the procedure, it is common to have:  Pain around your incision  area.  A small amount of blood or clear fluid coming from your incision.  Follow these instructions at home: Eating and drinking   Follow instructions from your health care provider about eating or drinking restrictions. ? Limit alcohol intake to no more than 1 drink per day for nonpregnant women and 2 drinks per day for men. One drink equals 12 oz of beer, 5 oz of wine, or 1 oz of hard liquor. ? Limit how much caffeine you drink. Caffeine can affect your heart's rate and rhythm.  Drink enough fluid  to keep your urine clear or pale yellow.  Eat a heart-healthy diet. This should include plenty of fresh fruits and vegetables. If you eat meat, it should be lean cuts. Avoid foods that are: ? High in salt, saturated fat, or sugar. ? Canned or highly processed. ? Fried. Activity  Return to your normal activities as told by your health care provider. Ask your health care provider what activities are safe for you.  Exercise regularly once you have recovered, as told by your health care provider.  Avoid sitting for more than 2 hours at a time without moving. Get up and move around at least once every 1-2 hours. This helps to prevent blood clots in the legs.  Do not lift anything that is heavier than 10 lb (4.5 kg) until your health care provider approves.  Avoid pushing or pulling things with your arms until your health care provider approves. This includes pulling on handrails to help you climb stairs. Incision care   Follow instructions from your health care provider about how to take care of your incision. Make sure you: ? Wash your hands with soap and water before you change your bandage (dressing). If soap and water are not available, use hand sanitizer. ? Change your dressing as told by your health care provider. ? Leave stitches (sutures), skin glue, or adhesive strips in place. These skin closures may need to stay in place for 2 weeks or longer. If adhesive strip edges start to  loosen and curl up, you may trim the loose edges. Do not remove adhesive strips completely unless your health care provider tells you to do that.  Check your incision area every day for signs of infection. Check for: ? More redness, swelling, or pain. ? More fluid or blood. ? Warmth. ? Pus or a bad smell. Medicines  Take over-the-counter and prescription medicines only as told by your health care provider.  If you were prescribed an antibiotic medicine, take it as told by your health care provider. Do not stop taking the antibiotic even if you start to feel better. Travel  Avoid airplane travel for as long as told by your health care provider.  When you travel, bring a list of your medicines and a record of your medical history with you. Carry your medicines with you. Driving  Ask your health care provider when it is safe for you to drive. Do not drive until your health care provider approves.  Do not drive or operate heavy machinery while taking prescription pain medicine. Lifestyle   Do not use any tobacco products, such as cigarettes, chewing tobacco, or e-cigarettes. If you need help quitting, ask your health care provider.  Resume sexual activity as told by your health care provider. Do not use medicines for erectile dysfunction unless your health care provider approves, if this applies.  Work with your health care provider to keep your blood pressure and cholesterol under control, and to manage any other heart conditions that you have.  Maintain a healthy weight. General instructions  Do not take baths, swim, or use a hot tub until your health care provider approves.  Do not strain to have a bowel movement.  Avoid crossing your legs while sitting down.  Check your temperature every day for a fever. A fever may be a sign of infection.  If you are a woman and you plan to become pregnant, talk with your health care provider before you become pregnant.  Wear compression  stockings if your  health care provider instructs you to do this. These stockings help to prevent blood clots and reduce swelling in your legs.  Tell all health care providers who care for you that you have an artificial (prosthetic) aortic valve. If you have or have had heart disease or endocarditis, tell all health care providers about these conditions as well.  Keep all follow-up visits as told by your health care provider. This is important. Contact a health care provider if:  You develop a skin rash.  You experience sudden, unexplained changes in your weight.  You have more redness, swelling, or pain around your incision.  You have more fluid or blood coming from your incision.  Your incision feels warm to the touch.  You have pus or a bad smell coming from your incision.  You have a fever. Get help right away if:  You develop chest pain that is different from the pain coming from your incision.  You develop shortness of breath or difficulty breathing.  You start to feel light-headed. These symptoms may represent a serious problem that is an emergency. Do not wait to see if the symptoms will go away. Get medical help right away. Call your local emergency services (911 in the [place] Do not drive yourself to the hospital. This information is not intended to replace advice given to you by your health care provider. Make sure you discuss any questions you have with your health care provider. Document Released: 05/16/2005 Document Revised: 04/04/2016 Document Reviewed: 10/01/2015 Elsevier Interactive Patient Education  2017 [place] Reviewed: 09/16/2016 Elsevier Interactive Patient Education  2017 [employer].

## 2018-07-02 NOTE — Progress Notes (Signed)
[address]       [place] [address]             [phone]                    Klay A [facility] [facility] Medical Record #[id number] Date of Birth: August 12, 1951  Referring: Satira Sark, MD Primary Care: [name], MD Primary Cardiologist: [name], MD  Chief Complaint:    Chief Complaint  Patient presents with  . Thoracic Aortic Aneurysm    f/u to further discuss surgery  . Aortic Stenosis    History of Present Illness:    Lawrence [name] 67 y.o. male is seen in the office for follow-up evaluation of dilated ascending aorta and bicuspid aortic valve.  The patient notes that in the spring of 2018 he was noted to have a soft murmur. Echocardiogram done at that time showed a bicuspid aortic valve.  In October 2018  the patient had some chest discomfort and was seen in the emergency room,  CTA of the chest was done that showed a dilated ascending aorta to 5.0 cm without evidence of dissection.  He was seen in the surgery office after this episode it was  as recommended to him to proceed with cardiac catheterization as a preop evaluation for possible root replacement and ascending aorta replacement in the face of a dilated ascending aorta with a bicuspid valve.  Cardiac catheterization was performed,  he was told all was okay so he assumed he did not need to return.   A follow-up CTA scan was done in the spring 2019 by cardiology  Patient has a history of hypertension but otherwise no significant cardiac history.   patient does have a family history of her brother who at age 43 died suddenly at home found outside of the shower no autopsy was done  Patient is returned from visiting his family in [place] and is now willing to proceed with replacement of his aortic valve and ascending aorta.    Current Activity/ Functional Status:  Patient is independent with mobility/ambulation, transfers, ADL's, IADL's.  Patient is disabled from  chronic back pain     Current Activity/ Functional Status:  Patient is independent with mobility/ambulation, transfers, ADL's, IADL's.   Zubrod Score: At the time of surgery this patient's most appropriate activity status/level should be described as: [x]     0    Normal activity, no symptoms []     1    Restricted in physical strenuous activity but ambulatory, able to do out light work []     2    Ambulatory and capable of self care, unable to do work activities, up and about               >50 % of waking hours                              []     3    Only limited self care, in bed greater than 50% of waking hours []     4    Completely disabled, no self care, confined to bed or chair []     5    Moribund   Past Medical History:  Diagnosis Date  . Aortic atherosclerosis ([place])   . Aortic insufficiency   . Aortic stenosis   . Arthritis   . Bicuspid aortic valve  a. mild AS, mild-mod AI by echo 03/2017, 5cm thoracic aortic aneurysm by CT 07/2017.  . Essential hypertension   . GERD (gastroesophageal reflux disease)   . Gout   . Heart murmur   . Thoracic aortic aneurysm ([place])    a. 07/2017: 5cm by CT.    Past Surgical History:  Procedure Laterality Date  . COLONOSCOPY N/A 08/29/2016   Procedure: COLONOSCOPY;  Surgeon: Danie Binder, MD;  [facility];  Service: Endoscopy;  Laterality: N/A;  1030  . COLONOSCOPY N/A 03/09/2018   Procedure: COLONOSCOPY;  Surgeon: Danie Binder, MD;  [facility];  Service: Endoscopy;  Laterality: N/A;  1:15pm  . HERNIA REPAIR     umbilical  . MASS EXCISION N/A 03/18/2018   Procedure: EXCISION CYST ON BACK 2CM;  Surgeon: [name], MD;  Location: [facility];  Service: General;  Laterality: N/A;  . POLYPECTOMY  03/09/2018   Procedure: POLYPECTOMY;  Surgeon: Danie Binder, MD;  Location[facility];  Service: Endoscopy;;  ascending, transverse x2; descending, sigmoid  . RIGHT/LEFT HEART CATH AND CORONARY ANGIOGRAPHY N/A  09/05/2017   Procedure: RIGHT/LEFT HEART CATH AND CORONARY ANGIOGRAPHY;  Surgeon: Larey Dresser, MD;  Location: [facility];  Service: Cardiovascular;  Laterality: N/A;    Family History  Problem Relation Age of Onset  . Colon polyps Sister   . Heart attack Mother   . Stomach cancer Father   . Colon cancer Neg Hx      Social History   Tobacco Use  Smoking Status Former Smoker  . Packs/day: 2.50  . Years: 25.00  . Pack years: 62.50  . Types: Cigarettes  . Last attempt to quit: 08/02/1991  . Years since quitting: 26.9  Smokeless Tobacco Never Used    Social History   Substance and Sexual Activity  Alcohol Use Yes   Comment: Occasionally, with sporting events up to 4 at a time.     Allergies  Allergen Reactions  . Ciprofloxacin   . Morphine And Related Other (See Comments)    Caused patient to pass out.     Current Outpatient Medications  Medication Sig Dispense Refill  . allopurinol (ZYLOPRIM) 300 MG tablet Take 300 mg by mouth daily as needed (gout flare).     . calcium carbonate (TUMS - DOSED IN MG ELEMENTAL CALCIUM) 500 MG chewable tablet Chew 1-2 tablets by mouth 2 (two) times daily as needed for indigestion or heartburn (for stomach pain due to spicy foods).     . cyclobenzaprine (FLEXERIL) 5 MG tablet Take 5 mg by mouth at bedtime as needed for muscle spasms.     Marland Kitchen docusate sodium (COLACE) 100 MG capsule Take 1 capsule (100 mg total) by mouth 2 (two) times daily. 60 capsule 2  . ibuprofen (ADVIL,MOTRIN) 600 MG tablet Take 600 mg by mouth 3 (three) times daily as needed for moderate pain.     Marland Kitchen lisinopril (PRINIVIL,ZESTRIL) 40 MG tablet Take 40 mg by mouth daily.    . metoprolol tartrate (LOPRESSOR) 25 MG tablet Take 1 tablet (25 mg total) by mouth 2 (two) times daily. (Patient taking differently: Take 25 mg by mouth daily. ) 180 tablet 1  . nitroGLYCERIN (NITROSTAT) 0.3 MG SL tablet Place 0.3 mg under the tongue every 5 (five) minutes as needed for chest  pain.    Marland Kitchen amLODipine (NORVASC) 5 MG tablet Take 1 tablet (5 mg total) by mouth daily. 90 tablet 1  No current facility-administered medications for this visit.     Pertinent items are noted in HPI.   Review of Systems:     Cardiac Review of Systems: [Y] = yes  or   [ N ] = no   Chest Pain [ n ]  Resting SOB [ n ] Exertional SOB  [n]  Orthopnea [ n]   Pedal Edema [n ]    Palpitations [n ] Syncope  [n]   Presyncope [n  ]   General Review of Systems: [Y] = yes [  ]=no Constitional: recent weight change [  ];  Wt loss over the last 3 months [   ] anorexia [  ]; fatigue [  ]; nausea [  ]; night sweats [  ]; fever [  ]; or chills [  ];           Eye : blurred vision [  ]; diplopia [   ]; vision changes [  ];  Amaurosis fugax[  ]; Resp: cough [  ];  wheezing[  ];  hemoptysis[  ]; shortness of breath[  ]; paroxysmal nocturnal dyspnea[  ]; dyspnea on exertion[  ]; or orthopnea[  ];  GI:  gallstones[  ], vomiting[  ];  dysphagia[  ]; melena[  ];  hematochezia [  ]; heartburn[  ];   Hx of  Colonoscopy[  ]; GU: kidney stones [  ]; hematuria[  ];   dysuria [  ];  nocturia[  ];  history of     obstruction [  ]; urinary frequency [  ]             Skin: rash, swelling[  ];, hair loss[  ];  peripheral edema[  ];  or itching[  ]; Musculosketetal: myalgias[  ];  joint swelling[  ];  joint erythema[  ];  joint pain[  ];  back pain[  ];  Heme/Lymph: bruising[  ];  bleeding[  ];  anemia[  ];  Neuro: TIA[  ];  headaches[  ];  stroke[  ];  vertigo[  ];  seizures[  ];   paresthesias[  ];  difficulty walking[  ];  Psych:depression[  ]; anxiety[  ];  Endocrine: diabetes[  ];  thyroid dysfunction[  ];  Immunizations: Flu up to date [  ]; Pneumococcal up to date [  ];  Other:             Dental: Patient has upper dentures, all teeth have been removed     PHYSICAL EXAMINATION: Ht 5\' 10"  (1.778 m)   BMI 29.99 kg/m  General appearance: alert, cooperative and appears stated age Head: Normocephalic, without  obvious abnormality, atraumatic Neck: no adenopathy, no carotid bruit, no JVD, supple, symmetrical, trachea midline and thyroid not enlarged, symmetric, no tenderness/mass/nodules Lymph nodes: Cervical, supraclavicular, and axillary nodes normal. Resp: clear to auscultation bilaterally Back: symmetric, no curvature. ROM normal. No CVA tenderness. Cardio: regular rate and rhythm, S1, S2 normal, n of aortic insufficienc mild murmur, click, rub or gallop GI: soft, non-tender; bowel sounds normal; no masses,  no organomegaly Extremities: extremities normal, atraumatic, no cyanosis or edema Neurologic: Grossly normal Patient has no signs or symptoms of Marfan syndrome, negative thumb/fist sign  Diagnostic Studies & Laboratory data:      Recent Lab Findings: Lab Results  Component Value Date   WBC 3.9 (L) 03/11/2018   HGB 14.3 03/11/2018   HCT 44.8 03/11/2018   PLT 195 03/11/2018   GLUCOSE 113 (H) 03/11/2018  CHOL 193 09/02/2017   TRIG 133 09/02/2017   HDL 37 (L) 09/02/2017   LDLCALC 129 (H) 09/02/2017   ALT 28 09/02/2017   AST 22 09/02/2017   NA 138 03/11/2018   K 4.5 03/11/2018   CL 105 03/11/2018   CREATININE 1.14 03/11/2018   BUN 17 03/11/2018   CO2 26 03/11/2018   TSH 1.304 09/02/2017   INR 1.06 09/02/2017   HGBA1C 6.0 (H) 09/02/2017   CATH: 09/05/2017  Fick Cardiac Output 4.76 L/min  Fick Cardiac Output Index 2.23 (L/min)/BSA  RA A Wave 8 mmHg  RA V Wave 7 mmHg  RA Mean 5 mmHg  RV Systolic Pressure 29 mmHg  RV Diastolic Pressure 1 mmHg  RV EDP 7 mmHg  PA Systolic Pressure 30 mmHg  PA Diastolic Pressure 11 mmHg  PA Mean 19 mmHg  PW A Wave 18 mmHg  PW V Wave 15 mmHg  PW Mean 13 mmHg  AO Systolic Pressure 169 mmHg  AO Diastolic Pressure 83 mmHg  AO Mean 112 mmHg  QP/QS 1  TPVR Index 8.5 HRUI  TSVR Index 50.09 HRUI  PVR SVR Ratio 0.06  TPVR/TSVR Ratio 0.17   Diagnostic  Dominance: Right  Left Main  No significant coronary disease.  Left Anterior  Descending  No significant coronary disease.  Ramus Intermedius  Moderate ramus. No significant coronary disease.  Left Circumflex  No significant coronary disease.  Right Coronary Artery  No significant coronary disease.  Intervention   No interventions have been documented.         ECHO: ATTENDING    [name], M.D.  ORDERING     [name], M.D.  REFERRING    [name], M.D.  SONOGRAPHER  [facility]  PERFORMING   [facility], Deneise Lever [place]  cc:  ------------------------------------------------------------------- LV EF: 55% -   60%  ------------------------------------------------------------------- Indications:      Aortic stenosis 424.1.  ------------------------------------------------------------------- History:   PMH:  Former Smoker. Thoracic aortic aneurysm  Risk factors:  Hypertension.  ------------------------------------------------------------------- Study Conclusions  - Left ventricle: The cavity size was normal. Wall thickness was   increased in a pattern of moderate LVH. Systolic function was   normal. The estimated ejection fraction was in the range of 55%   to 60%. The study is not technically sufficient to allow   evaluation of LV diastolic function. - Aortic valve: Probable bicuspid aortic valve. There was mild to   moderate regurgitation. Valve area (VTI): 1.8 cm^2. Valve area   (Vmax): 1.73 cm^2. Valve area (Vmean): 2.48 cm^2. - Aorta: The visualized portion of the proximal ascending aorta is   moderately enlarged at 4.5 cm. Aortic root dimension: 41 mm (ED). - Aortic root: The aortic root was mildly dilated. - Technically adequate study.  ------------------------------------------------------------------- Study data:  Comparison was made to the study of 03/13/2017.  Study status:  Routine.  Procedure:  Transthoracic echocardiography. Image quality was adequate.          Transthoracic echocardiography.  M-mode, complete  2D, spectral Doppler, and color Doppler.  Birthdate:  Patient birthdate: Oct 18, 1951.  Age:  Patient is 67 yr old.  Sex:  Gender: male.    BMI: 29.6 kg/m^2.  Patient status:  Outpatient.  Study date:  Study date: 02/24/2018. Study time: 10:29 AM.  Location:  Echo laboratory.  -------------------------------------------------------------------  ------------------------------------------------------------------- Left ventricle:  The cavity size was normal. Wall thickness was increased in a pattern of moderate LVH. Systolic function was normal. The estimated ejection fraction was in the  range of 55% to 60%. Images were inadequate for LV wall motion assessment. The study is not technically sufficient to allow evaluation of LV diastolic function.  ------------------------------------------------------------------- Aortic valve:   Normal thickness leaflets. Probable bicuspid aortic valve.  Doppler:   There was no stenosis.   There was mild to moderate regurgitation.    VTI ratio of LVOT to aortic valve: 0.52. Valve area (VTI): 1.8 cm^2. Indexed valve area (VTI): 0.83 cm^2/m^2. Peak velocity ratio of LVOT to aortic valve: 0.5. Valve area (Vmax): 1.73 cm^2. Indexed valve area (Vmax): 0.8 cm^2/m^2. Mean velocity ratio of LVOT to aortic valve: 0.72. Valve area (Vmean): 2.48 cm^2. Indexed valve area (Vmean): 1.14 cm^2/m^2. Mean gradient (S): 9 mm Hg. Peak gradient (S): 17 mm Hg.  ------------------------------------------------------------------- Aorta:  The visualized portion of the proximal ascending aorta is moderately enlarged at 4.5 cm. Aortic root: The aortic root was mildly dilated.  ------------------------------------------------------------------- Mitral valve:   Normal thickness leaflets .  Doppler:   There was no evidence for stenosis.   There was no significant regurgitation.   ------------------------------------------------------------------- Left atrium:  The atrium was  normal in size.  ------------------------------------------------------------------- Atrial septum:  Poorly visualized.  ------------------------------------------------------------------- Right ventricle:  The cavity size was normal. Systolic function was normal.  ------------------------------------------------------------------- Pulmonic valve:   Not well visualized.  Doppler:   There was no evidence for stenosis.   There was no significant regurgitation.   ------------------------------------------------------------------- Tricuspid valve:   Normal thickness leaflets.  Doppler:   There was no evidence for stenosis.   There was no significant regurgitation.   ------------------------------------------------------------------- Pulmonary artery:    Systolic pressure could not be accurately estimated.   Inadequate TR jet.  ------------------------------------------------------------------- Right atrium:  The atrium was normal in size.  ------------------------------------------------------------------- Pericardium:  There was no pericardial effusion.  ------------------------------------------------------------------- Systemic veins:  IVC poorly visualized.  ------------------------------------------------------------------- Post procedure conclusions Ascending Aorta:  - The visualized portion of the proximal ascending aorta is   moderately enlarged at 4.5 cm.  ------------------------------------------------------------------- Measurements   Left ventricle                           Value           Reference  LV ID, ED, PLAX chordal                  44     mm       43 - 52  LV ID, ES, PLAX chordal                  30     mm       23 - 38  LV fx shortening, PLAX chordal           32     %        >=29  LV PW thickness, ED                      13.2   mm       ---------  IVS/LV PW ratio, ED                      1.02            <=1.3    Ventricular septum                        Value  Reference  IVS thickness, ED                        13.5   mm       ---------    LVOT                                     Value           Reference  LVOT ID, S                               21     mm       ---------  LVOT area                                3.46   cm^2     ---------  LVOT peak velocity, S                    103.84 cm/s     ---------  LVOT mean velocity, S                    100    cm/s     ---------  LVOT VTI, S                              26.98  cm       ---------  Stroke volume (SV), LVOT DP              93.5   ml       ---------  Stroke index (SV/bsa), LVOT DP           43     ml/m^2   ---------    Aortic valve                             Value           Reference  Aortic valve peak velocity, S            206.31 cm/s     ---------  Aortic valve mean velocity, S            139.35 cm/s     ---------  Aortic valve VTI, S                      51.47  cm       ---------  Aortic mean gradient, S                  9      mm Hg    ---------  Aortic peak gradient, S                  17     mm Hg    ---------  VTI ratio, LVOT/AV                       0.52            ---------  Aortic valve area, VTI  1.8    cm^2     ---------  Aortic valve area/bsa, VTI               0.83   cm^2/m^2 ---------  Velocity ratio, peak, LVOT/AV            0.5             ---------  Aortic valve area, peak velocity         1.73   cm^2     ---------  Aortic valve area/bsa, peak              0.8    cm^2/m^2 ---------  velocity  Velocity ratio, mean, LVOT/AV            0.72            ---------  Aortic valve area, mean velocity         2.48   cm^2     ---------  Aortic valve area/bsa, mean              1.14   cm^2/m^2 ---------  velocity  Aortic regurg pressure half-time         633    ms       ---------    Aorta                                    Value           Reference  Aortic root ID, ED                       41     mm       ---------    Left atrium                               Value           Reference  LA ID, A-P, ES                           37     mm       ---------  LA volume/bsa, S                         27.9   ml/m^2   ---------    Right ventricle                          Value           Reference  TAPSE                                    17.3   mm       ---------  RV s&', lateral, S                        11.1   cm/s     ---------  Legend: (L)  and  (H)  mark values outside specified reference range.  ------------------------------------------------------------------- Prepared and Electronically Authenticated by  Kerry Hough, M.D. 2019-04-16T14:32:51       CLINICAL DATA:  Ascending thoracic aortic aneurysm  EXAM:  CT ANGIOGRAPHY CHEST WITH CONTRAST  TECHNIQUE: Multidetector CT imaging of the chest was performed using the standard protocol during bolus administration of intravenous contrast. Multiplanar CT image reconstructions and MIPs were obtained to evaluate the vascular anatomy.  CONTRAST:  172mL ISOVUE-370 IOPAMIDOL (ISOVUE-370) INJECTION 76%  COMPARISON:  08/04/2017  FINDINGS: Cardiovascular: Ascending thoracic aorta has a stable fusiform aneurysm measuring 5 cm in transaxial dimension. Sino-tubular junction measures 3.4 cm. Sinus of Valsalva measures 4.6 cm. Patent 3 vessel arch anatomy. Negative for dissection or mediastinal hemorrhage. Normal heart size. No pericardial effusion. No large central pulmonary embolus.  Mediastinum/Nodes: No enlarged mediastinal, hilar, or axillary lymph nodes. Thyroid gland, trachea, and esophagus demonstrate no significant findings.  Lungs/Pleura: Minor apical paraseptal emphysema dependent bibasilar atelectasis. No focal pneumonia, collapse or consolidation. Negative for edema or interstitial process. No pleural abnormality, effusion or pneumothorax. Trachea and central airways are patent.  Upper Abdomen: No acute  abnormality.  Musculoskeletal: No acute osseous finding.  Review of the MIP images confirms the above findings.  IMPRESSION: Stable 5 cm ascending thoracic aorta fusiform aneurysm.  Recommend semi-annual imaging followup by CTA or MRA and referral to cardiothoracic surgery if not already obtained. This recommendation follows 2010 ACCF/AHA/AATS/ACR/ASA/SCA/SCAI/SIR/STS/SVM Guidelines for the Diagnosis and Management of Patients With Thoracic Aortic Disease. Circulation. 2010; 121: [id number]  No acute intrathoracic process.  Bibasilar atelectasis/scarring  Emphysema (ICD10-J43.9).   Electronically Signed   By: Jerilynn Mages.  Shick M.D.   On: 02/24/2018 14:00   I have independently reviewed the above radiology studies  and reviewed the findings with the patient.    CLINICAL DATA:  Non radiating LEFT chest pain for 30 minutes. Flu symptoms. History of hypertension.  EXAM: CT ANGIOGRAPHY CHEST, ABDOMEN AND PELVIS  TECHNIQUE: Multidetector CT imaging through the chest, abdomen and pelvis was performed using the standard protocol during bolus administration of intravenous contrast. Multiplanar reconstructed images and MIPs were obtained and reviewed to evaluate the vascular anatomy.  CONTRAST:  100 cc Isovue 370  COMPARISON:  Chest radiograph August 04, 2017 at 1610 hours an CT abdomen and pelvis June 17, 2013  FINDINGS: CTA CHEST FINDINGS  CARDIOVASCULAR: Ascending aorta is 5 cm in transaxial dimension. Mild calcific atherosclerosis. No intrinsic density on noncontrast CT. Homogeneous contrast opacification of thoracic aorta without dissection, aneurysm, luminal irregularity, periaortic fluid collections, or contrast extravasation. Heart size is normal. Mild coronary artery calcification. No pericardial effusion. No central pulmonary embolism.  MEDIASTINUM/NODES: No mediastinal mass or lymphadenopathy by CT size criteria. Mildly patulous thoracic  esophagus with air-fluid level associated with reflux.  LUNGS/PLEURA: Tracheobronchial tree is patent, no pneumothorax. Mild centrilobular emphysema. Apical bullous changes. No pleural effusions, focal consolidations, pulmonary nodules or masses. Bibasilar bandlike densities.  MUSCULOSKELETAL: Non-suspicious.  Review of the MIP images confirms the above findings.  CTA ABDOMEN AND PELVIS FINDINGS  VASCULAR  Aorta: Abdominal aorta is normal caliber. Mildly tortuous course. Mild calcific atherosclerosis Homogeneous contrast opacification of aortoiliac vessels without dissection, aneurysm, luminal irregularity, periaortic fluid collections, or contrast extravasation.  Celiac: Patent.  SMA: Patent.  Renals: Patent.  IMA: Patent.  Inflow: Negative.  Veins: Negative, not tailored for evaluation.  Review of the MIP images confirms the above findings.  NON-VASCULAR  HEPATOBILIARY: Liver and gallbladder are normal.  PANCREAS: Normal.  SPLEEN: Normal considering arterial phase.  ADRENALS/URINARY TRACT: Kidneys are orthotopic, demonstrating symmetric enhancement. No nephrolithiasis, hydronephrosis or solid renal masses. The unopacified ureters are normal in course and caliber. Urinary bladder is well distended and unremarkable. Normal adrenal glands.  STOMACH/BOWEL: The stomach, small and large bowel are normal in course and caliber without inflammatory changes, sensitivity decreased without oral contrast. Mild colonic diverticulosis. Normal appendix.  VASCULAR/LYMPHATIC: No lymphadenopathy by CT size criteria.  REPRODUCTIVE: Mild prostatomegaly.  OTHER: No intraperitoneal free fluid or free air.  MUSCULOSKELETAL: Nonacute. Small fat containing LEFT greater than RIGHT inguinal hernias. Subcentimeter sclerotic presumed bone island LEFT ischial tuberosity. Broad thoracolumbar dextroscoliosis.  Review of the MIP images confirms the above  findings.  IMPRESSION: CTA CHEST:  1. 5 cm fusiform aneurysm ascending aorta. Recommend semi-annual imaging followup by CTA or MRA and referral to cardiothoracic surgery if not already obtained. This recommendation follows 2010 ACCF/AHA/AATS/ACR/ASA/SCA/SCAI/SIR/STS/SVM Guidelines for the Diagnosis and Management of Patients With Thoracic Aortic Disease. Circulation. 2010; 121: [id number]. 2. Mild emphysema.  Bibasilar atelectasis/scarring.  CTA ABDOMEN AND PELVIS:  1. No acute vascular process or or acute intra-abdominal/pelvic disease. 2. Mild colonic diverticulosis. Aortic Atherosclerosis (ICD10-I70.0), Emphysema (ICD10-J43.9), Aortic aneurysm NOS (ICD10-I71.9).   Electronically Signed   By: Elon Alas M.D.   On: 08/04/2017 17:34        Assessment / Plan:   5 cm ascending aorta without significant coronary artery disease and a bicuspid aortic valve with some sclerosis and mild to moderate insufficiency and with positive family history suggestive of acute aortic dissection.  I discussed with the patient proceeding ascending aorta the aortic root and aortic valve replacement, with the size of 5 cm of bicuspid valve a positive family history of a brother who died suddenly in a young age possible aortic dissection.  Risks and options of surgery were discussed in detail.  We discussed use of pericardial versus mechanical valves.  Patient prefers not to take Coumadin and prefers a tissue valve He would like to wait to proceed with surgery now that he has returned from [place] visiting his family.  Tentatively plan surgery for September 9   The [employer] [facility]) and the [facility]) have issued a statement to clarify 2 previous guidelines from the [facility], [facility], and collaborating societies addressing the risk of aortic dissection in patients with bicuspid aortic valves (BAV) and severe aortic enlargement. The 2 guidelines differ  with regard to the recommended threshold of aortic root or ascending aortic dilatation that would justify surgical intervention in patients with bicuspid aortic valves. This new statement of clarification uses the ACC/AHA revised structure for delineating the Class of Recommendation and Level of Evidence to provide recommendation that replace those contained in Section 9.2.2.1 of the thoracic aortic disease guidelines and Section 5.1.3 of the valvular heart disease guideline. New recommendations in intervention in patients with BAV and dilatation of the aortic root (sinuses) or ascending aorta include:  . Operative intervention to repair or replace the aortic root (sinuses) or replace the ascending aorta is indicated in asymptomatic patients with BAV if the diameter of the aortic root or ascending aorta is 5.5 cm or greater. (Class of recommendation 1, Level of evidence B-NR).  Marland Kitchen Operative intervention to repair or replace the aortic root (sinuses) or replace the ascending aorta is reasonable in asymptomatic patients with BAV if the diameter of the aortic root or ascending aorta is 5.0 cm or greater and an additional risk factor for dissection is present or if the patient is at low surgical risk and the surgery is performed by an experienced aortic surgical team in a center with established expertise in these procedures. (Class of recommendation IIa; Level of Evidence B-NR).  . Replacement  of the ascending aorta is reasonable in patients with BAV undergoing AVR because of severe aortic stenosis or aortic regurgitation when the diameter of the ascending aorta is greater than 4.5 cm (Class of recommendation IIa; Level of evidence C-EO).  Citation: Donnamarie Poag, Randolm Idol, et al. Surgery for aortic dilatation in patients with bicuspid aortic valves. A statement of clarification from the [employer]/American Heart Association Task Force on Clinical Practice Guidelines. [Published  online ahead of print October 14, 2014]. Circulation. doi: 10.1161/CIR.0000000000000331.    I  spent 40 minutes with  the patient face to face and greater then 50% of the time was spent in counseling and coordination of care.    [name] MD      [address] [place] [address] Office [phone]   Beeper [phone]  07/02/2018 10:48 AM

## 2018-07-03 ENCOUNTER — Encounter: Payer: Self-pay | Admitting: *Deleted

## 2018-07-12 ENCOUNTER — Other Ambulatory Visit: Payer: Self-pay | Admitting: Physician Assistant

## 2018-07-15 NOTE — Pre-Procedure Instructions (Signed)
Lawrence [name]  07/15/2018      [facility] [phone] - [place], [place] - [place] AT [place] [address] [place] [address] Phone: [phone] Fax: [phone]    Your procedure is scheduled on Monday September 9th.  Report to [facility] Admitting at [place].M.  Call this number if you have problems the morning of surgery:  [phone]   Remember:  Do not eat or drink after midnight.    Take these medicines the morning of surgery with A SIP OF WATER   Allopurinol (if needed)  Norvasc  Meotprolol  7 days prior to surgery STOP taking any Aspirin(unless otherwise instructed by your surgeon), Aleve, Naproxen, Ibuprofen, Motrin, Advil, Goody's, BC's, all herbal medications, fish oil, and all vitamins     Do not wear jewelry  Do not wear lotions, powders, or colognes, or deodorant.  Do not shave 48 hours prior to surgery.  Men may shave face and neck.  Do not bring valuables to the hospital.  [facility] is not responsible for any belongings or valuables.  Contacts, dentures or bridgework may not be worn into surgery.  Leave your suitcase in the car.  After surgery it may be brought to your room.  For patients admitted to the hospital, discharge time will be determined by your treatment team.  Patients discharged the day of surgery will not be allowed to drive home.    [facility]- Preparing For Surgery  Before surgery, you can play an important role. Because skin is not sterile, your skin needs to be as free of germs as possible. You can reduce the number of germs on your skin by washing with CHG (chlorahexidine gluconate) Soap before surgery.  CHG is an antiseptic cleaner which kills germs and bonds with the skin to continue killing germs even after washing.    Oral Hygiene is also important to reduce your risk of infection.  Remember - BRUSH YOUR TEETH THE MORNING OF SURGERY WITH YOUR REGULAR  TOOTHPASTE  Please do not use if you have an allergy to CHG or antibacterial soaps. If your skin becomes reddened/irritated stop using the CHG.  Do not shave (including legs and underarms) for at least 48 hours prior to first CHG shower. It is OK to shave your face.  Please follow these instructions carefully.   1. Shower the NIGHT BEFORE SURGERY and the MORNING OF SURGERY with CHG.   2. If you chose to wash your hair, wash your hair first as usual with your normal shampoo.  3. After you shampoo, rinse your hair and body thoroughly to remove the shampoo.  4. Use CHG as you would any other liquid soap. You can apply CHG directly to the skin and wash gently with a scrungie or a clean washcloth.   5. Apply the CHG Soap to your body ONLY FROM THE NECK DOWN.  Do not use on open wounds or open sores. Avoid contact with your eyes, ears, mouth and genitals (private parts). Wash Face and genitals (private parts)  with your normal soap.  6. Wash thoroughly, paying special attention to the area where your surgery will be performed.  7. Thoroughly rinse your body with warm water from the neck down.  8. DO NOT shower/wash with your normal soap after using and rinsing off the CHG Soap.  9. Pat yourself dry with a CLEAN TOWEL.  10. Wear CLEAN PAJAMAS to bed the night before  surgery, wear comfortable clothes the morning of surgery  11. Place CLEAN SHEETS on your bed the night of your first shower and DO NOT SLEEP WITH PETS.    Day of Surgery:  Do not apply any deodorants/lotions.  Please wear clean clothes to the hospital/surgery center.   Remember to brush your teeth WITH YOUR REGULAR TOOTHPASTE.    Please read over the following fact sheets that you were given.

## 2018-07-16 ENCOUNTER — Ambulatory Visit (HOSPITAL_COMMUNITY)
Admission: RE | Admit: 2018-07-16 | Discharge: 2018-07-16 | Disposition: A | Discharge status: Home / Self Care | Payer: Medicare HMO | Source: Ambulatory Visit | Attending: Cardiothoracic Surgery | Admitting: Cardiothoracic Surgery

## 2018-07-16 ENCOUNTER — Encounter (HOSPITAL_COMMUNITY): Payer: Self-pay

## 2018-07-16 ENCOUNTER — Other Ambulatory Visit: Payer: Self-pay

## 2018-07-16 ENCOUNTER — Encounter (HOSPITAL_COMMUNITY)
Admission: RE | Admit: 2018-07-16 | Discharge: 2018-07-16 | Disposition: A | Discharge status: Home / Self Care | Payer: Medicare HMO | Source: Ambulatory Visit | Attending: Cardiothoracic Surgery | Admitting: Cardiothoracic Surgery

## 2018-07-16 DIAGNOSIS — Z01818 Encounter for other preprocedural examination: Secondary | ICD-10-CM | POA: Insufficient documentation

## 2018-07-16 DIAGNOSIS — I351 Nonrheumatic aortic (valve) insufficiency: Secondary | ICD-10-CM

## 2018-07-16 DIAGNOSIS — Z01812 Encounter for preprocedural laboratory examination: Secondary | ICD-10-CM | POA: Diagnosis present

## 2018-07-16 DIAGNOSIS — Z01811 Encounter for preprocedural respiratory examination: Secondary | ICD-10-CM | POA: Diagnosis not present

## 2018-07-16 DIAGNOSIS — I712 Thoracic aortic aneurysm, without rupture, unspecified: Secondary | ICD-10-CM

## 2018-07-16 HISTORY — DX: Dyspnea, unspecified: R06.00

## 2018-07-16 LAB — COMPREHENSIVE METABOLIC PANEL
ALT: 32 U/L (ref 0–44)
AST: 24 U/L (ref 15–41)
Albumin: 4.2 g/dL (ref 3.5–5.0)
Alkaline Phosphatase: 69 U/L (ref 38–126)
Anion gap: 11 (ref 5–15)
BUN: 18 mg/dL (ref 8–23)
CO2: 20 mmol/L — ABNORMAL LOW (ref 22–32)
Calcium: 9.5 mg/dL (ref 8.9–10.3)
Chloride: 108 mmol/L (ref 98–111)
Creatinine, Ser: 1.02 mg/dL (ref 0.61–1.24)
GFR calc Af Amer: >60 mL/min (ref >60)
GFR calc non Af Amer: >60 mL/min (ref >60)
Glucose, Bld: 110 mg/dL — ABNORMAL HIGH (ref 70–99)
Potassium: 4.3 mmol/L (ref 3.5–5.1)
Sodium: 139 mmol/L (ref 135–145)
Total Bilirubin: 0.9 mg/dL (ref 0.3–1.2)
Total Protein: 6.8 g/dL (ref 6.5–8.1)

## 2018-07-16 LAB — PULMONARY FUNCTION TEST
DL/VA % pred: 91 %
DL/VA: 4.23 ml/min/mmHg/L
DLCO unc % pred: 84 %
DLCO unc: 27.21 ml/min/mmHg
FEF 25-75 Post: 3.43 L/sec
FEF 25-75 Pre: 2.97 L/sec
FEF2575-%Change-Post: 15 %
FEF2575-%Pred-Post: 128 %
FEF2575-%Pred-Pre: 111 %
FEV1-%Change-Post: 0 %
FEV1-%Pred-Post: 98 %
FEV1-%Pred-Pre: 99 %
FEV1-Post: 3.35 L
FEV1-Pre: 3.37 L
FEV1FVC-%Change-Post: 0 %
FEV1FVC-%Pred-Pre: 108 %
FEV6-%Change-Post: 0 %
FEV6-%Pred-Post: 96 %
FEV6-%Pred-Pre: 96 %
FEV6-Post: 4.16 L
FEV6-Pre: 4.17 L
FEV6FVC-%Change-Post: 0 %
FEV6FVC-%Pred-Post: 105 %
FEV6FVC-%Pred-Pre: 105 %
FVC-%Change-Post: 0 %
FVC-%Pred-Post: 91 %
FVC-%Pred-Pre: 91 %
FVC-Post: 4.16 L
FVC-Pre: 4.17 L
Post FEV1/FVC ratio: 81 %
Post FEV6/FVC ratio: 100 %
Pre FEV1/FVC ratio: 81 %
Pre FEV6/FVC Ratio: 100 %
RV % pred: 126 %
RV: 2.99 L
TLC % pred: 104 %
TLC: 7.35 L

## 2018-07-16 LAB — TYPE AND SCREEN
ABO/RH(D): O POS
Antibody Screen: NEGATIVE

## 2018-07-16 LAB — CBC
HCT: 47.7 % (ref 39.0–52.0)
Hemoglobin: 15.9 g/dL (ref 13.0–17.0)
MCH: 31.4 pg (ref 26.0–34.0)
MCHC: 33.3 g/dL (ref 30.0–36.0)
MCV: 94.3 fL (ref 78.0–100.0)
Platelets: 171 10*3/uL (ref 150–400)
RBC: 5.06 MIL/uL (ref 4.22–5.81)
RDW: 12.6 % (ref 11.5–15.5)
WBC: 5.2 10*3/uL (ref 4.0–10.5)

## 2018-07-16 LAB — BLOOD GAS, ARTERIAL
Acid-Base Excess: 0.2 mmol/L (ref 0.0–2.0)
Bicarbonate: 24.1 mmol/L (ref 20.0–28.0)
Drawn by: 421801
FIO2: 21
O2 Saturation: 98.3 %
Patient temperature: 98.6
pCO2 arterial: 37.6 mmHg (ref 32.0–48.0)
pH, Arterial: 7.422 (ref 7.350–7.450)
pO2, Arterial: 110 mmHg — ABNORMAL HIGH (ref 83.0–108.0)

## 2018-07-16 LAB — PROTIME-INR
INR: 1.05
Prothrombin Time: 13.6 seconds (ref 11.4–15.2)

## 2018-07-16 LAB — SURGICAL PCR SCREEN
MRSA, PCR: NEGATIVE
Staphylococcus aureus: POSITIVE — AB

## 2018-07-16 LAB — URINALYSIS, ROUTINE W REFLEX MICROSCOPIC
Bilirubin Urine: NEGATIVE
Glucose, UA: NEGATIVE mg/dL
Hgb urine dipstick: NEGATIVE
Ketones, ur: NEGATIVE mg/dL
Leukocytes, UA: NEGATIVE
Nitrite: NEGATIVE
Protein, ur: NEGATIVE mg/dL
Specific Gravity, Urine: 1.008 (ref 1.005–1.030)
pH: 8 (ref 5.0–8.0)

## 2018-07-16 LAB — HEMOGLOBIN A1C
Hgb A1c MFr Bld: 6 % — ABNORMAL HIGH (ref 4.8–5.6)
Mean Plasma Glucose: 125.5 mg/dL

## 2018-07-16 LAB — APTT: aPTT: 29 seconds (ref 24–36)

## 2018-07-16 LAB — ABO/RH: ABO/RH(D): O POS

## 2018-07-16 MED ORDER — ALBUTEROL SULFATE (2.5 MG/3ML) 0.083% IN NEBU
2.5000 mg | INHALATION_SOLUTION | Freq: Once | RESPIRATORY_TRACT | Status: AC
  Administered 2018-07-16: 2.5 mg via RESPIRATORY_TRACT

## 2018-07-16 NOTE — Pre-Procedure Instructions (Signed)
Lawrence [name]  07/16/2018       Your procedure is scheduled on Monday September 9th.  Report to [facility] Admitting at 5:30 A.M.               Your surgery or procedure is scheduled for 7:30 AM   Call this number if you have problems the morning of surgery:  [phone]  This is the number for the Pre- Surgical Desk.   Remember:  Do not eat or drink after midnight Sunday, September 8.    Take these medicines the morning of surgery with A SIP OF WATER   Norvasc  Metoprolol               Allopurinol (if needed)  7 days prior to surgery STOP taking any Aspirin(unless otherwise instructed by your surgeon), Aleve, Naproxen, Ibuprofen, Motrin, Advil, Goody's, BC's, all herbal medications, fish oil, and all vitamins   [place]- Preparing For Surgery  Before surgery, you can play an important role. Because skin is not sterile, your skin needs to be as free of germs as possible. You can reduce the number of germs on your skin by washing with CHG (chlorahexidine gluconate) Soap before surgery.  CHG is an antiseptic cleaner which kills germs and bonds with the skin to continue killing germs even after washing.    Oral Hygiene is also important to reduce your risk of infection.  Remember - BRUSH YOUR TEETH THE MORNING OF SURGERY WITH YOUR REGULAR TOOTHPASTE  Please do not use if you have an allergy to CHG or antibacterial soaps. If your skin becomes reddened/irritated stop using the CHG.  Do not shave (including legs and underarms) for at least 48 hours prior to first CHG shower. It is OK to shave your face.  Please follow these instructions carefully.   1. Shower the NIGHT BEFORE SURGERY and the MORNING OF SURGERY with CHG.   2. If you chose to wash your hair, wash your hair first as usual with your normal shampoo.  3. After you shampoo, Wash your face and private area with the soap you use at home, rinse your hair and body thoroughly to remove the shampoo and  soap.  4. Use CHG as you would any other liquid soap. You can apply CHG directly to the skin and wash gently with a scrungie or a clean washcloth.   Apply the CHG Soap to your body ONLY FROM THE NECK DOWN.  Do not use on open wounds or open sores. Avoid contact with your eyes, ears, mouth and genitals (private parts).  5. Wash thoroughly, paying special attention to the area where your surgery will be performed.  6. Thoroughly rinse your body with warm water from the neck down.  7. DO NOT shower/wash with your normal soap after using and rinsing off the CHG Soap.  8. Pat yourself dry with a CLEAN TOWEL.  9. Wear CLEAN PAJAMAS to bed the night before surgery, wear comfortable clothes the morning of surgery  10. Place CLEAN SHEETS on your bed the night of your first shower and DO NOT SLEEP WITH PETS.  Day of Surgery: Surgery as written above  Do not apply any deodorants/lotions, powders or colognes.  Please wear clean clothes to the hospital/surgery center.   Remember to brush your teeth WITH YOUR REGULAR TOOTHPASTE.  Do not wear jewelry  Do not shave 48 hours prior to surgery.  Men may shave face and neck.  Do not  bring valuables to the hospital.  [facility] is not responsible for any belongings or valuables.  Contacts, dentures or bridgework may not be worn into surgery.  Leave your suitcase in the car.  After surgery it may be brought to your room.  For patients admitted to the hospital, discharge time will be determined by your treatment team.  Patients discharged the day of surgery will not be allowed to drive home.   Please read over the following fact sheets that you were given.

## 2018-07-16 NOTE — Progress Notes (Signed)
Pre-op Cardiac Surgery  Carotid Findings:  1-39% Stenosis bilateral ICAs. Bilateral vertebral arteries patent with antegrade flow.   Upper Extremity Right Left  Brachial Pressures 116 120  Radial Waveforms Triphasic Biphasic  Ulnar Waveforms Biphasic Biphasic  Palmar Arch (Allen's Test) See below See below   Findings:   Right Upper Extremity: Doppler waveforms remain within normal limits with right radial compression. Doppler waveforms decrease >50% with right ulnar compression.  Left Upper Extremity: Doppler waveforms decrease 50% with left radial compression. Doppler waveforms remain within normal limits with left ulnar compression.   Hongying Corda Shutt (RDMS RVT) 07/16/18 3:05 PM

## 2018-07-19 MED ORDER — MAGNESIUM SULFATE 50 % IJ SOLN
40.0000 meq | INTRAMUSCULAR | Status: DC
  Filled 2018-07-19: qty 9.85

## 2018-07-19 MED ORDER — TRANEXAMIC ACID 1000 MG/10ML IV SOLN
1.5000 mg/kg/h | INTRAVENOUS | Status: AC
  Administered 2018-07-20: 1.5 mg/kg/h via INTRAVENOUS
  Filled 2018-07-19: qty 25

## 2018-07-19 MED ORDER — SODIUM CHLORIDE 0.9 % IV SOLN
30.0000 ug/min | INTRAVENOUS | Status: AC
  Administered 2018-07-20: 40 ug/min via INTRAVENOUS
  Filled 2018-07-19: qty 2

## 2018-07-19 MED ORDER — EPINEPHRINE PF 1 MG/ML IJ SOLN
0.0000 ug/min | INTRAVENOUS | Status: DC
  Filled 2018-07-19: qty 4

## 2018-07-19 MED ORDER — POTASSIUM CHLORIDE 2 MEQ/ML IV SOLN
80.0000 meq | INTRAVENOUS | Status: DC
  Filled 2018-07-19: qty 40

## 2018-07-19 MED ORDER — SODIUM CHLORIDE 0.9 % IV SOLN
1.5000 g | INTRAVENOUS | Status: AC
  Administered 2018-07-20: 1.5 g via INTRAVENOUS
  Filled 2018-07-19 (×2): qty 1.5

## 2018-07-19 MED ORDER — VANCOMYCIN HCL 10 G IV SOLR
1250.0000 mg | INTRAVENOUS | Status: AC
  Administered 2018-07-20: 1250 mg via INTRAVENOUS
  Filled 2018-07-19: qty 1250

## 2018-07-19 MED ORDER — SODIUM CHLORIDE 0.9 % IV SOLN
INTRAVENOUS | Status: DC
  Filled 2018-07-19: qty 30

## 2018-07-19 MED ORDER — TRANEXAMIC ACID (OHS) PUMP PRIME SOLUTION
2.0000 mg/kg | INTRAVENOUS | Status: DC
  Filled 2018-07-19: qty 1.91

## 2018-07-19 MED ORDER — NITROGLYCERIN IN D5W 200-5 MCG/ML-% IV SOLN
2.0000 ug/min | INTRAVENOUS | Status: AC
  Administered 2018-07-20: 30 ug/min via INTRAVENOUS
  Filled 2018-07-19: qty 250

## 2018-07-19 MED ORDER — TRANEXAMIC ACID (OHS) BOLUS VIA INFUSION
15.0000 mg/kg | INTRAVENOUS | Status: AC
  Administered 2018-07-20: 1431 mg via INTRAVENOUS
  Filled 2018-07-19 (×2): qty 1431

## 2018-07-19 MED ORDER — SODIUM CHLORIDE 0.9 % IV SOLN
INTRAVENOUS | Status: AC
  Administered 2018-07-20: 1.2 [IU]/h via INTRAVENOUS
  Filled 2018-07-19: qty 1

## 2018-07-19 MED ORDER — SODIUM CHLORIDE 0.9 % IV SOLN
750.0000 mg | INTRAVENOUS | Status: AC
  Administered 2018-07-20: 750 mg via INTRAVENOUS
  Filled 2018-07-19: qty 750

## 2018-07-19 MED ORDER — PLASMA-LYTE 148 IV SOLN
INTRAVENOUS | Status: DC
  Filled 2018-07-19: qty 2.5

## 2018-07-19 MED ORDER — DOPAMINE-DEXTROSE 3.2-5 MG/ML-% IV SOLN
0.0000 ug/kg/min | INTRAVENOUS | Status: DC
  Filled 2018-07-19: qty 250

## 2018-07-19 MED ORDER — MILRINONE LACTATE IN DEXTROSE 20-5 MG/100ML-% IV SOLN
0.3000 ug/kg/min | INTRAVENOUS | Status: DC
  Filled 2018-07-19: qty 100

## 2018-07-19 MED ORDER — DEXMEDETOMIDINE HCL IN NACL 400 MCG/100ML IV SOLN
0.1000 ug/kg/h | INTRAVENOUS | Status: AC
  Administered 2018-07-20: .5 ug/kg/h via INTRAVENOUS
  Filled 2018-07-19: qty 100

## 2018-07-20 ENCOUNTER — Inpatient Hospital Stay (HOSPITAL_COMMUNITY): Payer: Medicare HMO | Admitting: Certified Registered"

## 2018-07-20 ENCOUNTER — Inpatient Hospital Stay (HOSPITAL_COMMUNITY)
Admission: RE | Admit: 2018-07-20 | Discharge: 2018-07-27 | DRG: 220 | Disposition: A | Discharge status: Home Health | Payer: Medicare HMO | Attending: Cardiothoracic Surgery | Admitting: Cardiothoracic Surgery

## 2018-07-20 ENCOUNTER — Other Ambulatory Visit: Payer: Self-pay

## 2018-07-20 ENCOUNTER — Encounter (HOSPITAL_COMMUNITY): Payer: Self-pay | Admitting: *Deleted

## 2018-07-20 ENCOUNTER — Inpatient Hospital Stay (HOSPITAL_COMMUNITY): Payer: Medicare HMO

## 2018-07-20 ENCOUNTER — Encounter (HOSPITAL_COMMUNITY)
Admission: RE | Disposition: A | Discharge status: Home Health | Payer: Self-pay | Source: Home / Self Care | Attending: Cardiothoracic Surgery

## 2018-07-20 DIAGNOSIS — M549 Dorsalgia, unspecified: Secondary | ICD-10-CM | POA: Diagnosis not present

## 2018-07-20 DIAGNOSIS — I350 Nonrheumatic aortic (valve) stenosis: Secondary | ICD-10-CM | POA: Diagnosis present

## 2018-07-20 DIAGNOSIS — Z87891 Personal history of nicotine dependence: Secondary | ICD-10-CM | POA: Diagnosis not present

## 2018-07-20 DIAGNOSIS — I712 Thoracic aortic aneurysm, without rupture, unspecified: Secondary | ICD-10-CM

## 2018-07-20 DIAGNOSIS — E8770 Fluid overload, unspecified: Secondary | ICD-10-CM | POA: Diagnosis not present

## 2018-07-20 DIAGNOSIS — Z800 Family history of malignant neoplasm of digestive organs: Secondary | ICD-10-CM

## 2018-07-20 DIAGNOSIS — D62 Acute posthemorrhagic anemia: Secondary | ICD-10-CM | POA: Diagnosis not present

## 2018-07-20 DIAGNOSIS — Q231 Congenital insufficiency of aortic valve: Secondary | ICD-10-CM

## 2018-07-20 DIAGNOSIS — G8929 Other chronic pain: Secondary | ICD-10-CM | POA: Diagnosis present

## 2018-07-20 DIAGNOSIS — Z9889 Other specified postprocedural states: Secondary | ICD-10-CM

## 2018-07-20 DIAGNOSIS — I10 Essential (primary) hypertension: Secondary | ICD-10-CM | POA: Diagnosis present

## 2018-07-20 DIAGNOSIS — I088 Other rheumatic multiple valve diseases: Secondary | ICD-10-CM | POA: Diagnosis not present

## 2018-07-20 DIAGNOSIS — I739 Peripheral vascular disease, unspecified: Secondary | ICD-10-CM | POA: Diagnosis not present

## 2018-07-20 DIAGNOSIS — Z8249 Family history of ischemic heart disease and other diseases of the circulatory system: Secondary | ICD-10-CM | POA: Diagnosis not present

## 2018-07-20 DIAGNOSIS — J9811 Atelectasis: Secondary | ICD-10-CM | POA: Diagnosis not present

## 2018-07-20 DIAGNOSIS — I351 Nonrheumatic aortic (valve) insufficiency: Secondary | ICD-10-CM

## 2018-07-20 DIAGNOSIS — Z09 Encounter for follow-up examination after completed treatment for conditions other than malignant neoplasm: Secondary | ICD-10-CM

## 2018-07-20 DIAGNOSIS — R079 Chest pain, unspecified: Secondary | ICD-10-CM | POA: Diagnosis not present

## 2018-07-20 HISTORY — PX: ASCENDING AORTIC ROOT REPLACEMENT: SHX5729

## 2018-07-20 HISTORY — PX: TEE WITHOUT CARDIOVERSION: SHX5443

## 2018-07-20 LAB — CBC
HCT: 38.2 % — ABNORMAL LOW (ref 39.0–52.0)
HEMATOCRIT: 37.6 % — AB (ref 39.0–52.0)
HEMOGLOBIN: 12.7 g/dL — AB (ref 13.0–17.0)
Hemoglobin: 12.7 g/dL — ABNORMAL LOW (ref 13.0–17.0)
MCH: 31.3 pg (ref 26.0–34.0)
MCH: 31.3 pg (ref 26.0–34.0)
MCHC: 33.8 g/dL (ref 30.0–36.0)
MCHC: 33.2 g/dL (ref 30.0–36.0)
MCV: 92.6 fL (ref 78.0–100.0)
MCV: 94.1 fL (ref 78.0–100.0)
PLATELETS: 104 10*3/uL — AB (ref 150–400)
Platelets: 119 10*3/uL — ABNORMAL LOW (ref 150–400)
RBC: 4.06 MIL/uL — AB (ref 4.22–5.81)
RBC: 4.06 MIL/uL — ABNORMAL LOW (ref 4.22–5.81)
RDW: 12.3 % (ref 11.5–15.5)
RDW: 12.3 % (ref 11.5–15.5)
WBC: 10.3 10*3/uL (ref 4.0–10.5)
WBC: 11.6 10*3/uL — ABNORMAL HIGH (ref 4.0–10.5)

## 2018-07-20 LAB — POCT I-STAT 3, ART BLOOD GAS (G3+)
Acid-base deficit: 4 mmol/L — ABNORMAL HIGH (ref 0.0–2.0)
Bicarbonate: 21.4 mmol/L (ref 20.0–28.0)
O2 Saturation: 98 %
Patient temperature: 34.9
TCO2: 23 mmol/L (ref 22–32)
pCO2 arterial: 34.9 mmHg (ref 32.0–48.0)
pH, Arterial: 7.385 (ref 7.350–7.450)
pO2, Arterial: 98 mmHg (ref 83.0–108.0)

## 2018-07-20 LAB — POCT I-STAT, CHEM 8
BUN: 24 mg/dL — ABNORMAL HIGH (ref 8–23)
Calcium, Ion: 1.16 mmol/L (ref 1.15–1.40)
Chloride: 108 mmol/L (ref 98–111)
Creatinine, Ser: 1 mg/dL (ref 0.61–1.24)
Glucose, Bld: 174 mg/dL — ABNORMAL HIGH (ref 70–99)
HCT: 36 % — ABNORMAL LOW (ref 39.0–52.0)
Hemoglobin: 12.2 g/dL — ABNORMAL LOW (ref 13.0–17.0)
Potassium: 5.3 mmol/L — ABNORMAL HIGH (ref 3.5–5.1)
Sodium: 139 mmol/L (ref 135–145)
TCO2: 21 mmol/L — ABNORMAL LOW (ref 22–32)

## 2018-07-20 LAB — CREATININE, SERUM
Creatinine, Ser: 1.06 mg/dL (ref 0.61–1.24)
GFR calc Af Amer: >60 mL/min
GFR calc non Af Amer: >60 mL/min

## 2018-07-20 LAB — POCT I-STAT 4, (NA,K, GLUC, HGB,HCT)
Glucose, Bld: 106 mg/dL — ABNORMAL HIGH (ref 70–99)
HCT: 34 % — ABNORMAL LOW (ref 39.0–52.0)
Hemoglobin: 11.6 g/dL — ABNORMAL LOW (ref 13.0–17.0)
Potassium: 3.4 mmol/L — ABNORMAL LOW (ref 3.5–5.1)
Sodium: 143 mmol/L (ref 135–145)

## 2018-07-20 LAB — FIBRINOGEN: Fibrinogen: 168 mg/dL — ABNORMAL LOW (ref 210–475)

## 2018-07-20 LAB — GLUCOSE, CAPILLARY
Glucose-Capillary: 101 mg/dL — ABNORMAL HIGH (ref 70–99)
Glucose-Capillary: 109 mg/dL — ABNORMAL HIGH (ref 70–99)
Glucose-Capillary: 97 mg/dL (ref 70–99)

## 2018-07-20 LAB — PROTIME-INR
INR: 1.71
PROTHROMBIN TIME: 19.9 s — AB (ref 11.4–15.2)

## 2018-07-20 LAB — PLATELET COUNT: Platelets: 121 10*3/uL — ABNORMAL LOW (ref 150–400)

## 2018-07-20 LAB — HEMOGLOBIN AND HEMATOCRIT, BLOOD
HCT: 32.3 % — ABNORMAL LOW (ref 39.0–52.0)
Hemoglobin: 10.9 g/dL — ABNORMAL LOW (ref 13.0–17.0)

## 2018-07-20 LAB — APTT: APTT: 34 s (ref 24–36)

## 2018-07-20 LAB — MAGNESIUM: Magnesium: 2.4 mg/dL (ref 1.7–2.4)

## 2018-07-20 SURGERY — ASCENDING AORTIC ROOT REPLACEMENT
Anesthesia: General | Site: Chest

## 2018-07-20 MED ORDER — MIDAZOLAM HCL 5 MG/5ML IJ SOLN
INTRAMUSCULAR | Status: DC | PRN
  Administered 2018-07-20: 1 mg via INTRAVENOUS
  Administered 2018-07-20: 2 mg via INTRAVENOUS
  Administered 2018-07-20: 4 mg via INTRAVENOUS
  Administered 2018-07-20: 2 mg via INTRAVENOUS

## 2018-07-20 MED ORDER — BISACODYL 10 MG RE SUPP: 10.0000 mg | Freq: Every day | RECTAL | Status: DC

## 2018-07-20 MED ORDER — LACTATED RINGERS IV SOLN
INTRAVENOUS | Status: DC | PRN
  Administered 2018-07-20: 07:00:00 via INTRAVENOUS

## 2018-07-20 MED ORDER — LACTATED RINGERS IV SOLN: INTRAVENOUS | Status: DC

## 2018-07-20 MED ORDER — HEMOSTATIC AGENTS (NO CHARGE) OPTIME
TOPICAL | Status: DC | PRN
  Administered 2018-07-20 (×2): 2 via TOPICAL
  Administered 2018-07-20 (×2): 1 via TOPICAL

## 2018-07-20 MED ORDER — POTASSIUM CHLORIDE 10 MEQ/50ML IV SOLN
10.0000 meq | INTRAVENOUS | Status: AC
  Administered 2018-07-20 (×3): 10 meq via INTRAVENOUS

## 2018-07-20 MED ORDER — ROCURONIUM BROMIDE 50 MG/5ML IV SOSY
PREFILLED_SYRINGE | INTRAVENOUS | Status: AC
  Filled 2018-07-20: qty 15

## 2018-07-20 MED ORDER — PHENYLEPHRINE HCL-NACL 20-0.9 MG/250ML-% IV SOLN
0.0000 ug/min | INTRAVENOUS | Status: DC
  Administered 2018-07-21: 30 ug/min via INTRAVENOUS
  Filled 2018-07-20: qty 250

## 2018-07-20 MED ORDER — METOPROLOL TARTRATE 12.5 MG HALF TABLET: 12.5000 mg | ORAL_TABLET | Freq: Once | ORAL | Status: DC

## 2018-07-20 MED ORDER — PHENYLEPHRINE 40 MCG/ML (10ML) SYRINGE FOR IV PUSH (FOR BLOOD PRESSURE SUPPORT)
PREFILLED_SYRINGE | INTRAVENOUS | Status: AC
  Filled 2018-07-20: qty 10

## 2018-07-20 MED ORDER — ALBUMIN HUMAN 5 % IV SOLN
INTRAVENOUS | Status: DC | PRN
  Administered 2018-07-20: 13:00:00 via INTRAVENOUS

## 2018-07-20 MED ORDER — FENTANYL CITRATE (PF) 100 MCG/2ML IJ SOLN
25.0000 ug | INTRAMUSCULAR | Status: DC | PRN
  Administered 2018-07-20 – 2018-07-21 (×5): 25 ug via INTRAVENOUS
  Filled 2018-07-20 (×4): qty 2

## 2018-07-20 MED ORDER — INSULIN ASPART 100 UNIT/ML ~~LOC~~ SOLN
0.0000 [IU] | SUBCUTANEOUS | Status: DC
  Administered 2018-07-20 – 2018-07-21 (×2): 4 [IU] via SUBCUTANEOUS
  Administered 2018-07-21 (×2): 2 [IU] via SUBCUTANEOUS
  Administered 2018-07-21: 4 [IU] via SUBCUTANEOUS
  Administered 2018-07-21: 8 [IU] via SUBCUTANEOUS
  Administered 2018-07-21 – 2018-07-22 (×4): 2 [IU] via SUBCUTANEOUS

## 2018-07-20 MED ORDER — VANCOMYCIN HCL IN DEXTROSE 1-5 GM/200ML-% IV SOLN
1000.0000 mg | Freq: Once | INTRAVENOUS | Status: AC
  Administered 2018-07-20: 1000 mg via INTRAVENOUS
  Filled 2018-07-20: qty 200

## 2018-07-20 MED ORDER — SODIUM CHLORIDE 0.9 % IV SOLN
INTRAVENOUS | Status: DC | PRN
  Administered 2018-07-20: 13:00:00 via INTRAVENOUS

## 2018-07-20 MED ORDER — ORAL CARE MOUTH RINSE: 15.0000 mL | Freq: Two times a day (BID) | OROMUCOSAL | Status: DC

## 2018-07-20 MED ORDER — SODIUM CHLORIDE 0.9 % IV SOLN
INTRAVENOUS | Status: DC
  Filled 2018-07-20: qty 1

## 2018-07-20 MED ORDER — MIDAZOLAM HCL 10 MG/2ML IJ SOLN
INTRAMUSCULAR | Status: AC
  Filled 2018-07-20: qty 2

## 2018-07-20 MED ORDER — METOPROLOL TARTRATE 12.5 MG HALF TABLET
12.5000 mg | ORAL_TABLET | Freq: Two times a day (BID) | ORAL | Status: DC
  Administered 2018-07-21 – 2018-07-27 (×12): 12.5 mg via ORAL
  Filled 2018-07-20 (×12): qty 1

## 2018-07-20 MED ORDER — ARTIFICIAL TEARS OPHTHALMIC OINT
TOPICAL_OINTMENT | OPHTHALMIC | Status: AC
  Filled 2018-07-20: qty 3.5

## 2018-07-20 MED ORDER — SODIUM CHLORIDE 0.45 % IV SOLN: INTRAVENOUS | Status: DC | PRN

## 2018-07-20 MED ORDER — LACTATED RINGERS IV SOLN
INTRAVENOUS | Status: DC
  Administered 2018-07-20: 21:00:00 via INTRAVENOUS

## 2018-07-20 MED ORDER — INSULIN REGULAR BOLUS VIA INFUSION
0.0000 [IU] | Freq: Three times a day (TID) | INTRAVENOUS | Status: DC
  Filled 2018-07-20: qty 10

## 2018-07-20 MED ORDER — GLYCOPYRROLATE PF 0.2 MG/ML IJ SOSY
PREFILLED_SYRINGE | INTRAMUSCULAR | Status: DC | PRN
  Administered 2018-07-20: .2 mg via INTRAVENOUS

## 2018-07-20 MED ORDER — PROTAMINE SULFATE 10 MG/ML IV SOLN
INTRAVENOUS | Status: AC
  Filled 2018-07-20: qty 10

## 2018-07-20 MED ORDER — SODIUM CHLORIDE 0.9 % IV SOLN
INTRAVENOUS | Status: DC | PRN
  Administered 2018-07-20: 15 ug/min via INTRAVENOUS

## 2018-07-20 MED ORDER — PROTAMINE SULFATE 10 MG/ML IV SOLN
INTRAVENOUS | Status: DC | PRN
  Administered 2018-07-20: 330 mg via INTRAVENOUS
  Administered 2018-07-20: 25 mg via INTRAVENOUS

## 2018-07-20 MED ORDER — ONDANSETRON HCL 4 MG/2ML IJ SOLN
4.0000 mg | Freq: Four times a day (QID) | INTRAMUSCULAR | Status: DC | PRN
  Administered 2018-07-20 – 2018-07-21 (×2): 4 mg via INTRAVENOUS
  Filled 2018-07-20 (×2): qty 2

## 2018-07-20 MED ORDER — ONDANSETRON HCL 4 MG/2ML IJ SOLN
INTRAMUSCULAR | Status: DC | PRN
  Administered 2018-07-20: 4 mg via INTRAVENOUS

## 2018-07-20 MED ORDER — MIDAZOLAM HCL 2 MG/2ML IJ SOLN: 2.0000 mg | INTRAMUSCULAR | Status: DC | PRN

## 2018-07-20 MED ORDER — ROCURONIUM BROMIDE 50 MG/5ML IV SOSY
PREFILLED_SYRINGE | INTRAVENOUS | Status: AC
  Filled 2018-07-20: qty 10

## 2018-07-20 MED ORDER — ACETAMINOPHEN 160 MG/5ML PO SOLN: 650.0000 mg | Freq: Once | ORAL | Status: AC

## 2018-07-20 MED ORDER — FENTANYL CITRATE (PF) 250 MCG/5ML IJ SOLN
INTRAMUSCULAR | Status: AC
  Filled 2018-07-20: qty 25

## 2018-07-20 MED ORDER — ONDANSETRON HCL 4 MG/2ML IJ SOLN
INTRAMUSCULAR | Status: AC
  Filled 2018-07-20: qty 2

## 2018-07-20 MED ORDER — ACETAMINOPHEN 160 MG/5ML PO SOLN: 1000.0000 mg | Freq: Four times a day (QID) | ORAL | Status: AC

## 2018-07-20 MED ORDER — ARTIFICIAL TEARS OPHTHALMIC OINT
TOPICAL_OINTMENT | OPHTHALMIC | Status: DC | PRN
  Administered 2018-07-20: 1 via OPHTHALMIC

## 2018-07-20 MED ORDER — ACETAMINOPHEN 650 MG RE SUPP
650.0000 mg | Freq: Once | RECTAL | Status: AC
  Administered 2018-07-20: 650 mg via RECTAL

## 2018-07-20 MED ORDER — ASPIRIN 81 MG PO CHEW
324.0000 mg | CHEWABLE_TABLET | Freq: Every day | ORAL | Status: DC
  Administered 2018-07-25: 324 mg
  Filled 2018-07-20: qty 4

## 2018-07-20 MED ORDER — LACTATED RINGERS IV SOLN
500.0000 mL | Freq: Once | INTRAVENOUS | Status: AC | PRN
  Administered 2018-07-21: 500 mL via INTRAVENOUS

## 2018-07-20 MED ORDER — SODIUM CHLORIDE 0.9% FLUSH: 3.0000 mL | INTRAVENOUS | Status: DC | PRN

## 2018-07-20 MED ORDER — 0.9 % SODIUM CHLORIDE (POUR BTL) OPTIME
TOPICAL | Status: DC | PRN
  Administered 2018-07-20: 6000 mL

## 2018-07-20 MED ORDER — PROPOFOL 10 MG/ML IV BOLUS
INTRAVENOUS | Status: AC
  Filled 2018-07-20: qty 20

## 2018-07-20 MED ORDER — CHLORHEXIDINE GLUCONATE 4 % EX LIQD: 30.0000 mL | CUTANEOUS | Status: DC

## 2018-07-20 MED ORDER — FAMOTIDINE IN NACL 20-0.9 MG/50ML-% IV SOLN
20.0000 mg | Freq: Two times a day (BID) | INTRAVENOUS | Status: AC
  Administered 2018-07-20: 20 mg via INTRAVENOUS

## 2018-07-20 MED ORDER — ASPIRIN EC 325 MG PO TBEC
325.0000 mg | DELAYED_RELEASE_TABLET | Freq: Every day | ORAL | Status: DC
  Administered 2018-07-21 – 2018-07-27 (×7): 325 mg via ORAL
  Filled 2018-07-20 (×7): qty 1

## 2018-07-20 MED ORDER — PANTOPRAZOLE SODIUM 40 MG PO TBEC
40.0000 mg | DELAYED_RELEASE_TABLET | Freq: Every day | ORAL | Status: DC
  Administered 2018-07-22 – 2018-07-27 (×6): 40 mg via ORAL
  Filled 2018-07-20 (×6): qty 1

## 2018-07-20 MED ORDER — SODIUM CHLORIDE 0.9 % IV SOLN
250.0000 mL | INTRAVENOUS | Status: DC
  Administered 2018-07-20: 250 mL via INTRAVENOUS

## 2018-07-20 MED ORDER — SODIUM CHLORIDE 0.9 % IV SOLN
1.5000 g | Freq: Two times a day (BID) | INTRAVENOUS | Status: AC
  Administered 2018-07-20 – 2018-07-22 (×4): 1.5 g via INTRAVENOUS
  Filled 2018-07-20 (×5): qty 1.5

## 2018-07-20 MED ORDER — ALBUMIN HUMAN 5 % IV SOLN
250.0000 mL | INTRAVENOUS | Status: AC | PRN
  Administered 2018-07-20 (×3): 12.5 g via INTRAVENOUS
  Filled 2018-07-20: qty 500

## 2018-07-20 MED ORDER — METOPROLOL TARTRATE 5 MG/5ML IV SOLN: 2.5000 mg | INTRAVENOUS | Status: DC | PRN

## 2018-07-20 MED ORDER — CHLORHEXIDINE GLUCONATE 0.12 % MT SOLN
15.0000 mL | OROMUCOSAL | Status: AC
  Administered 2018-07-20: 15 mL via OROMUCOSAL
  Filled 2018-07-20: qty 15

## 2018-07-20 MED ORDER — MUPIROCIN 2 % EX OINT
1.0000 "application " | TOPICAL_OINTMENT | Freq: Two times a day (BID) | CUTANEOUS | Status: AC
  Administered 2018-07-20 – 2018-07-25 (×6): 1 via NASAL
  Filled 2018-07-20 (×2): qty 22

## 2018-07-20 MED ORDER — ROCURONIUM BROMIDE 10 MG/ML (PF) SYRINGE
PREFILLED_SYRINGE | INTRAVENOUS | Status: DC | PRN
  Administered 2018-07-20: 80 mg via INTRAVENOUS
  Administered 2018-07-20 (×3): 50 mg via INTRAVENOUS
  Administered 2018-07-20: 30 mg via INTRAVENOUS

## 2018-07-20 MED ORDER — GLYCOPYRROLATE PF 0.2 MG/ML IJ SOSY
PREFILLED_SYRINGE | INTRAMUSCULAR | Status: AC
  Filled 2018-07-20: qty 1

## 2018-07-20 MED ORDER — TRAMADOL HCL 50 MG PO TABS
50.0000 mg | ORAL_TABLET | ORAL | Status: DC | PRN
  Administered 2018-07-21 – 2018-07-24 (×11): 100 mg via ORAL
  Filled 2018-07-20 (×11): qty 2

## 2018-07-20 MED ORDER — FENTANYL CITRATE (PF) 250 MCG/5ML IJ SOLN
INTRAMUSCULAR | Status: DC | PRN
  Administered 2018-07-20 (×2): 50 ug via INTRAVENOUS
  Administered 2018-07-20: 150 ug via INTRAVENOUS
  Administered 2018-07-20: 250 ug via INTRAVENOUS
  Administered 2018-07-20 (×2): 100 ug via INTRAVENOUS
  Administered 2018-07-20 (×2): 50 ug via INTRAVENOUS
  Administered 2018-07-20: 100 ug via INTRAVENOUS

## 2018-07-20 MED ORDER — NITROGLYCERIN IN D5W 200-5 MCG/ML-% IV SOLN
0.0000 ug/min | INTRAVENOUS | Status: DC
  Administered 2018-07-20: 5 ug/min via INTRAVENOUS

## 2018-07-20 MED ORDER — CHLORHEXIDINE GLUCONATE CLOTH 2 % EX PADS
6.0000 | MEDICATED_PAD | Freq: Every day | CUTANEOUS | Status: AC
  Administered 2018-07-20 – 2018-07-22 (×3): 6 via TOPICAL

## 2018-07-20 MED ORDER — ORAL CARE MOUTH RINSE
15.0000 mL | OROMUCOSAL | Status: DC
  Administered 2018-07-20 (×3): 15 mL via OROMUCOSAL

## 2018-07-20 MED ORDER — CHLORHEXIDINE GLUCONATE 0.12 % MT SOLN
15.0000 mL | Freq: Once | OROMUCOSAL | Status: AC
  Administered 2018-07-20: 15 mL via OROMUCOSAL
  Filled 2018-07-20: qty 15

## 2018-07-20 MED ORDER — DEXMEDETOMIDINE HCL IN NACL 200 MCG/50ML IV SOLN
0.0000 ug/kg/h | INTRAVENOUS | Status: DC
  Administered 2018-07-20: 0.5 ug/kg/h via INTRAVENOUS
  Filled 2018-07-20: qty 50

## 2018-07-20 MED ORDER — METHYLPREDNISOLONE SODIUM SUCC 125 MG IJ SOLR
INTRAMUSCULAR | Status: DC | PRN
  Administered 2018-07-20: 125 mg via INTRAVENOUS

## 2018-07-20 MED ORDER — HEPARIN SODIUM (PORCINE) 1000 UNIT/ML IJ SOLN
INTRAMUSCULAR | Status: AC
  Filled 2018-07-20: qty 4

## 2018-07-20 MED ORDER — PROTAMINE SULFATE 10 MG/ML IV SOLN
INTRAVENOUS | Status: AC
  Filled 2018-07-20: qty 25

## 2018-07-20 MED ORDER — PROTAMINE SULFATE 10 MG/ML IV SOLN
INTRAVENOUS | Status: AC
  Filled 2018-07-20: qty 5

## 2018-07-20 MED ORDER — BISACODYL 5 MG PO TBEC
10.0000 mg | DELAYED_RELEASE_TABLET | Freq: Every day | ORAL | Status: DC
  Administered 2018-07-21 – 2018-07-25 (×4): 10 mg via ORAL
  Filled 2018-07-20 (×5): qty 2

## 2018-07-20 MED ORDER — CHLORHEXIDINE GLUCONATE 0.12% ORAL RINSE (MEDLINE KIT)
15.0000 mL | Freq: Two times a day (BID) | OROMUCOSAL | Status: DC
  Administered 2018-07-20: 15 mL via OROMUCOSAL

## 2018-07-20 MED ORDER — LACTATED RINGERS IV SOLN
INTRAVENOUS | Status: DC | PRN
  Administered 2018-07-20 (×2): via INTRAVENOUS

## 2018-07-20 MED ORDER — METOPROLOL TARTRATE 25 MG/10 ML ORAL SUSPENSION: 12.5000 mg | Freq: Two times a day (BID) | ORAL | Status: DC

## 2018-07-20 MED ORDER — ACETAMINOPHEN 500 MG PO TABS
1000.0000 mg | ORAL_TABLET | Freq: Four times a day (QID) | ORAL | Status: AC
  Administered 2018-07-21 – 2018-07-25 (×14): 1000 mg via ORAL
  Filled 2018-07-20 (×14): qty 2

## 2018-07-20 MED ORDER — PROPOFOL 10 MG/ML IV BOLUS
INTRAVENOUS | Status: DC | PRN
  Administered 2018-07-20: 50 mg via INTRAVENOUS
  Administered 2018-07-20: 10 mg via INTRAVENOUS
  Administered 2018-07-20: 30 mg via INTRAVENOUS
  Administered 2018-07-20: 10 mg via INTRAVENOUS
  Administered 2018-07-20: 40 mg via INTRAVENOUS
  Administered 2018-07-20: 30 mg via INTRAVENOUS

## 2018-07-20 MED ORDER — PHENYLEPHRINE 40 MCG/ML (10ML) SYRINGE FOR IV PUSH (FOR BLOOD PRESSURE SUPPORT)
PREFILLED_SYRINGE | INTRAVENOUS | Status: DC | PRN
  Administered 2018-07-20: 40 ug via INTRAVENOUS
  Administered 2018-07-20: 60 ug via INTRAVENOUS

## 2018-07-20 MED ORDER — HEPARIN SODIUM (PORCINE) 1000 UNIT/ML IJ SOLN
INTRAMUSCULAR | Status: DC | PRN
  Administered 2018-07-20: 33000 [IU] via INTRAVENOUS

## 2018-07-20 MED ORDER — MAGNESIUM SULFATE 4 GM/100ML IV SOLN
4.0000 g | Freq: Once | INTRAVENOUS | Status: AC
  Administered 2018-07-20: 4 g via INTRAVENOUS
  Filled 2018-07-20: qty 100

## 2018-07-20 MED ORDER — DOCUSATE SODIUM 100 MG PO CAPS
200.0000 mg | ORAL_CAPSULE | Freq: Every day | ORAL | Status: DC
  Administered 2018-07-21 – 2018-07-25 (×5): 200 mg via ORAL
  Filled 2018-07-20 (×5): qty 2

## 2018-07-20 MED ORDER — SODIUM CHLORIDE 0.9% FLUSH
3.0000 mL | Freq: Two times a day (BID) | INTRAVENOUS | Status: DC
  Administered 2018-07-21 – 2018-07-26 (×7): 3 mL via INTRAVENOUS

## 2018-07-20 MED ORDER — SODIUM CHLORIDE 0.9 % IV SOLN: INTRAVENOUS | Status: DC | PRN

## 2018-07-20 MED ORDER — OXYCODONE HCL 5 MG PO TABS
5.0000 mg | ORAL_TABLET | ORAL | Status: DC | PRN
  Administered 2018-07-21: 10 mg via ORAL
  Filled 2018-07-20: qty 2

## 2018-07-20 MED ORDER — SODIUM CHLORIDE 0.9 % IV SOLN
INTRAVENOUS | Status: DC
  Administered 2018-07-20 (×2): via INTRAVENOUS

## 2018-07-20 SURGICAL SUPPLY — 92 items
ADAPTER CARDIO PERF ANTE/RETRO (ADAPTER) ×6 IMPLANT
APPLICATOR COTTON TIP 6IN STRL (MISCELLANEOUS) IMPLANT
BAG DECANTER FOR FLEXI CONT (MISCELLANEOUS) ×3 IMPLANT
BLADE STERNUM SYSTEM 6 (BLADE) ×3 IMPLANT
BLADE SURG 15 STRL LF DISP TIS (BLADE) ×2 IMPLANT
BLADE SURG 15 STRL SS (BLADE) ×1
BOOT SUTURE AID YELLOW STND (SUTURE) IMPLANT
CANISTER SUCT 3000ML PPV (MISCELLANEOUS) ×3 IMPLANT
CANN PRFSN .5XCNCT 15X34-48 (MISCELLANEOUS) ×2
CANNULA AORTIC ROOT 9FR (CANNULA) ×3 IMPLANT
CANNULA GUNDRY RCSP 15FR (MISCELLANEOUS) ×6 IMPLANT
CANNULA PRFSN .5XCNCT 15X34-48 (MISCELLANEOUS) ×2 IMPLANT
CANNULA SUMP PERICARDIAL (CANNULA) ×3 IMPLANT
CANNULA VEN 2 STAGE (MISCELLANEOUS) ×1
CATH CPB KIT GERHARDT (MISCELLANEOUS) ×3 IMPLANT
CATH HEART VENT LEFT (CATHETERS) ×2 IMPLANT
CATH RETROPLEGIA CORONARY 14FR (CATHETERS) IMPLANT
CATH/SQUID NICHOLS JEHLE COR (CATHETERS) ×3 IMPLANT
CAUTERY EYE LOW TEMP 1300F FIN (OPHTHALMIC RELATED) ×3 IMPLANT
CLIP FOGARTY SPRING 6M (CLIP) IMPLANT
CRADLE DONUT ADULT HEAD (MISCELLANEOUS) ×3 IMPLANT
DRAIN CHANNEL 28F RND 3/8 FF (WOUND CARE) IMPLANT
DRAIN CHANNEL 32F RND 10.7 FF (WOUND CARE) ×3 IMPLANT
DRAPE CARDIOVASCULAR INCISE (DRAPES) ×1
DRAPE SLUSH/WARMER DISC (DRAPES) ×3 IMPLANT
DRAPE SRG 135X102X78XABS (DRAPES) ×2 IMPLANT
DRSG AQUACEL AG ADV 3.5X 6 (GAUZE/BANDAGES/DRESSINGS) ×3 IMPLANT
DRSG AQUACEL AG ADV 3.5X14 (GAUZE/BANDAGES/DRESSINGS) ×3 IMPLANT
ELECT BLADE 4.0 EZ CLEAN MEGAD (MISCELLANEOUS) ×3
ELECT CAUTERY BLADE 6.4 (BLADE) ×3 IMPLANT
ELECT REM PT RETURN 9FT ADLT (ELECTROSURGICAL) ×6
ELECTRODE BLDE 4.0 EZ CLN MEGD (MISCELLANEOUS) ×2 IMPLANT
ELECTRODE REM PT RTRN 9FT ADLT (ELECTROSURGICAL) ×4 IMPLANT
FELT TEFLON 1X6 (MISCELLANEOUS) ×6 IMPLANT
GAUZE SPONGE 4X4 12PLY STRL (GAUZE/BANDAGES/DRESSINGS) ×3 IMPLANT
GAUZE SPONGE 4X4 12PLY STRL LF (GAUZE/BANDAGES/DRESSINGS) ×6 IMPLANT
GLOVE BIO SURGEON STRL SZ 6.5 (GLOVE) ×9 IMPLANT
GOWN STRL REUS W/ TWL LRG LVL3 (GOWN DISPOSABLE) ×8 IMPLANT
GOWN STRL REUS W/TWL LRG LVL3 (GOWN DISPOSABLE) ×4
GRAFT GELWEAVE IMPREG 8X30CM (Prosthesis & Implant Heart) ×3 IMPLANT
GRAFT WOVEN D/V 32DX30L (Vascular Products) ×3 IMPLANT
HANDLE UNIV ENDO GIA (ENDOMECHANICALS) ×3 IMPLANT
HEMOSTAT POWDER SURGIFOAM 1G (HEMOSTASIS) ×12 IMPLANT
HEMOSTAT SURGICEL 2X14 (HEMOSTASIS) ×6 IMPLANT
INSERT FOGARTY SM (MISCELLANEOUS) ×3 IMPLANT
INSERT FOGARTY XLG (MISCELLANEOUS) ×3 IMPLANT
IV CATH 18G X1.75 CATHLON (IV SOLUTION) ×3 IMPLANT
KIT BASIN OR (CUSTOM PROCEDURE TRAY) ×3 IMPLANT
KIT CATH SUCT 8FR (CATHETERS) ×3 IMPLANT
KIT SUCTION CATH 14FR (SUCTIONS) ×9 IMPLANT
KIT TURNOVER KIT B (KITS) ×3 IMPLANT
LEAD PACING MYOCARDI (MISCELLANEOUS) ×3 IMPLANT
LINE VENT (MISCELLANEOUS) ×3 IMPLANT
LOOP VESSEL MAXI BLUE (MISCELLANEOUS) ×3 IMPLANT
LOOP VESSEL SUPERMAXI WHITE (MISCELLANEOUS) ×3 IMPLANT
NS IRRIG 1000ML POUR BTL (IV SOLUTION) ×15 IMPLANT
PACK E OPEN HEART (SUTURE) ×3 IMPLANT
PACK OPEN HEART (CUSTOM PROCEDURE TRAY) ×3 IMPLANT
PAD ARMBOARD 7.5X6 YLW CONV (MISCELLANEOUS) ×6 IMPLANT
POWDER SURGICEL 3.0 GRAM (HEMOSTASIS) ×3 IMPLANT
RELOAD EN GI ROT 45 2.0 (ENDOMECHANICALS) ×3 IMPLANT
SEALANT SURG COSEAL 8ML (VASCULAR PRODUCTS) ×6 IMPLANT
SENSOR MYOCARDIAL TEMP (MISCELLANEOUS) ×3 IMPLANT
SET CARDIOPLEGIA MPS 5001102 (MISCELLANEOUS) ×3 IMPLANT
SET VEIN GRAFT PERF (SET/KITS/TRAYS/PACK) ×3 IMPLANT
STOPCOCK 4 WAY LG BORE MALE ST (IV SETS) IMPLANT
SURGIFLO W/THROMBIN 8M KIT (HEMOSTASIS) ×3 IMPLANT
SUT ETHIBON 2 0 V 52N 30 (SUTURE) ×6 IMPLANT
SUT ETHIBOND 2 0 SH (SUTURE) ×1
SUT ETHIBOND 2 0 SH 36X2 (SUTURE) ×2 IMPLANT
SUT PROLENE 3 0 RB 1 (SUTURE) ×3 IMPLANT
SUT PROLENE 3 0 SH 1 (SUTURE) ×3 IMPLANT
SUT PROLENE 3 0 SH1 36 (SUTURE) ×3 IMPLANT
SUT PROLENE 4 0 RB 1 (SUTURE) ×13
SUT PROLENE 4-0 RB1 .5 CRCL 36 (SUTURE) ×26 IMPLANT
SUT STEEL 6MS V (SUTURE) ×3 IMPLANT
SUT STEEL SZ 6 DBL 3X14 BALL (SUTURE) ×3 IMPLANT
SUT VIC AB 1 CTX 18 (SUTURE) ×6 IMPLANT
SUT VIC AB 2-0 CTX 36 (SUTURE) ×3 IMPLANT
SUT VIC AB 3-0 X1 27 (SUTURE) ×3 IMPLANT
SYSTEM SAHARA CHEST DRAIN ATS (WOUND CARE) ×3 IMPLANT
TAPE CLOTH SURG 4X10 WHT LF (GAUZE/BANDAGES/DRESSINGS) ×3 IMPLANT
TOWEL GREEN STERILE (TOWEL DISPOSABLE) ×3 IMPLANT
TOWEL GREEN STERILE FF (TOWEL DISPOSABLE) ×3 IMPLANT
TRAY FOLEY SLVR 14FR TEMP STAT (SET/KITS/TRAYS/PACK) ×3 IMPLANT
TRAY FOLEY SLVR 16FR TEMP STAT (SET/KITS/TRAYS/PACK) ×3 IMPLANT
TUBE CONNECTING 20X1/4 (TUBING) ×3 IMPLANT
TUBE FEEDING 8FR 16IN STR KANG (MISCELLANEOUS) ×3 IMPLANT
UNDERPAD 30X30 (UNDERPADS AND DIAPERS) ×3 IMPLANT
VENT LEFT HEART 12002 (CATHETERS) ×3
WATER STERILE IRR 1000ML POUR (IV SOLUTION) ×6 IMPLANT
YANKAUER SUCT BULB TIP NO VENT (SUCTIONS) ×3 IMPLANT

## 2018-07-20 NOTE — Procedures (Addendum)
Extubation Procedure Note  Patient Details:   Name: Lawrence [name] DOB: January 25, 1951 MRN: [id number]   Airway Documentation:    Vent end date: 07/20/18 Vent end time: 2300   Evaluation  O2 sats: stable throughout Complications: No apparent complications Patient did tolerate procedure well. Bilateral Breath Sounds: Clear   Yes Pt. Extubated per rapid wean protocol. Pt. Performed 1.0L on the vital capacity and -30 on the NIF. Pt. Had a positive cuff leak. Pt. Placed on 4L nasal cannula. Pt. Performed 600x 5 on the IS. No issues at this time. [name] 07/20/2018, 11:05 PM

## 2018-07-20 NOTE — Anesthesia Preprocedure Evaluation (Signed)
Anesthesia Evaluation  Patient identified by MRN, date of birth, ID band Patient awake    Reviewed: Allergy & Precautions, NPO status , Patient's Chart, lab work & pertinent test results  History of Anesthesia Complications Negative for: history of anesthetic complications  Airway Mallampati: III  TM Distance: >3 FB Neck ROM: Full    Dental  (+) Edentulous Upper, Edentulous Lower   Pulmonary shortness of breath, former smoker,    breath sounds clear to auscultation       Cardiovascular Exercise Tolerance: Good hypertension, Pt. on medications + Peripheral Vascular Disease and + DOE  I+ Valvular Problems/Murmurs  Rhythm:Regular + Systolic murmurs Only occ DOE -none recent    Neuro/Psych negative neurological ROS  negative psych ROS   GI/Hepatic negative GI ROS, Neg liver ROS, GERD  ,Only PRN meds  Denies current Sx   Endo/Other  negative endocrine ROS  Renal/GU negative Renal ROS     Musculoskeletal  (+) Arthritis , Osteoarthritis,    Abdominal   Peds  Hematology negative hematology ROS (+)   Anesthesia Other Findings TTE 04/19: - Left ventricle: The cavity size was normal. Wall thickness was   increased in a pattern of moderate LVH. Systolic function was   normal. The estimated ejection fraction was in the range of 55%   to 60%. The study is not technically sufficient to allow   evaluation of LV diastolic function. - Aortic valve: Probable bicuspid aortic valve. There was mild to   moderate regurgitation. Valve area (VTI): 1.8 cm^2. Valve area   (Vmax): 1.73 cm^2. Valve area (Vmean): 2.48 cm^2. - Aorta: The visualized portion of the proximal ascending aorta is   moderately enlarged at 4.5 cm. Aortic root dimension: 41 mm (ED). - Aortic root: The aortic root was mildly dilated.  Reproductive/Obstetrics                             Anesthesia Physical Anesthesia Plan  ASA:  IV  Anesthesia Plan: General   Post-op Pain Management:    Induction: Intravenous  PONV Risk Score and Plan: 2 and Treatment may vary due to age or medical condition  Airway Management Planned: Oral ETT  Additional Equipment: Arterial line, CVP, PA Cath, TEE and Ultrasound Guidance Line Placement  Intra-op Plan:   Post-operative Plan: Post-operative intubation/ventilation  Informed Consent: I have reviewed the patients History and Physical, chart, labs and discussed the procedure including the risks, benefits and alternatives for the proposed anesthesia with the patient or authorized representative who has indicated his/her understanding and acceptance.   Dental advisory given  Plan Discussed with: CRNA and Surgeon  Anesthesia Plan Comments:         Anesthesia Quick Evaluation

## 2018-07-20 NOTE — Progress Notes (Signed)
Wean protocol initiated. 

## 2018-07-20 NOTE — Progress Notes (Signed)
Patient ID: Lawrence [name], male   DOB: [birth year], 67 y.o.   MRN: [id number] EVENING ROUNDS NOTE :     [address]       [place] [address]             [phone]                 Day of Surgery Procedure(s) (LRB): REPLACMENT OF ASCENDING AORTA WITH 32MM GRAFT. HYPOTHERMIC CIRCULATORY ARREST. WHEAT PROCEDURE. (N/A) TRANSESOPHAGEAL ECHOCARDIOGRAM (TEE) (N/A)  Total Length of Stay:  LOS: 0 days  BP 93/70   Pulse 80   Temp (!) 97 F (36.1 C)   Resp 14   Ht 5\' 10"  (1.778 m)   Wt 95.4 kg   SpO2 100%   BMI 30.17 kg/m   .Intake/Output      09/09 0701 - 09/10 0700   I.V. (mL/kg) 3323.9 (34.8)   Blood 500   IV Piggyback 3340.9   Total Intake(mL/kg) 7164.8 (75.1)   Urine (mL/kg/hr) 1085 (0.9)   Emesis/NG output 30   Blood 1000   Chest Tube 90   Total Output 2205   Net +4959.8         . sodium chloride 10 mL/hr at 07/20/18 1900  . [START ON 07/21/2018] sodium chloride 250 mL (07/20/18 1448)  . sodium chloride 10 mL/hr at 07/20/18 1453  . albumin human 12.5 g (07/20/18 1612)  . cefUROXime (ZINACEF)  IV Stopped (07/20/18 1846)  . dexmedetomidine (PRECEDEX) IV infusion 0.2 mcg/kg/hr (07/20/18 1900)  . famotidine (PEPCID) IV 20 mg (07/20/18 1430)  . lactated ringers    . lactated ringers    . lactated ringers 20 mL/hr at 07/20/18 1900  . magnesium sulfate 20 mL/hr at 07/20/18 1900  . nitroGLYCERIN Stopped (07/20/18 1855)  . phenylephrine (NEO-SYNEPHRINE) Adult infusion Stopped (07/20/18 1510)  . vancomycin       Lab Results  Component Value Date   WBC 10.3 07/20/2018   HGB 12.7 (L) 07/20/2018   HCT 37.6 (L) 07/20/2018   PLT 104 (L) 07/20/2018   GLUCOSE 110 (H) 07/16/2018   CHOL 193 09/02/2017   TRIG 133 09/02/2017   HDL 37 (L) 09/02/2017   LDLCALC 129 (H) 09/02/2017   ALT 32 07/16/2018   AST 24 07/16/2018   NA 139 07/16/2018   K 4.3 07/16/2018   CL 108 07/16/2018   CREATININE 1.02 07/16/2018   BUN 18 07/16/2018   CO2 20 (L) 07/16/2018   TSH 1.304 09/02/2017   INR 1.71 07/20/2018   HGBA1C 6.0 (H) 07/16/2018   Patient not bleeding, waking up, moves all extremities, still on vent    [name] MD  Beeper [phone] Office [phone] 07/20/2018 7:22 PM

## 2018-07-20 NOTE — Anesthesia Procedure Notes (Signed)
Arterial Line Insertion Start/End9/07/2018 6:35 AM, 07/20/2018 6:45 AM Performed by: Imagene Riches, CRNA, CRNA  Patient location: Pre-op. Preanesthetic checklist: patient identified, IV checked, site marked, risks and benefits discussed, surgical consent, monitors and equipment checked, pre-op evaluation, timeout performed and anesthesia consent Patient sedated Left, radial was placed Catheter size: 20 G Hand hygiene performed , maximum sterile barriers used  and Seldinger technique used Allen's test indicative of satisfactory collateral circulation Attempts: 1 Procedure performed without using ultrasound guided technique. Following insertion, dressing applied and Biopatch. Post procedure assessment: normal  Patient tolerated the procedure well with no immediate complications.

## 2018-07-20 NOTE — Progress Notes (Signed)
Patient interviewed on arrival to OR. Patient able to confirm name, DOB, procedure, allergies, npo status, no metal in body and no pain. Patient able to move over to OR table with minimal assistance.   Leatha Gilding, RN

## 2018-07-20 NOTE — Brief Op Note (Signed)
      [address]       [place] [address]             [phone]      07/20/2018  7:22 PM  PATIENT:  Lawrence [name]  67 y.o. male  PRE-OPERATIVE DIAGNOSIS:  TAA  POST-OPERATIVE DIAGNOSIS:  * No post-op diagnosis entered *  PROCEDURE:  Procedure(s): REPLACMENT OF ASCENDING AORTA WITH 32MM GRAFT. HYPOTHERMIC CIRCULATORY ARREST. WHEAT PROCEDURE. (N/A) TRANSESOPHAGEAL ECHOCARDIOGRAM (TEE) (N/A)  SURGEON:  Surgeon(s) and Role:    * [name], MD - Primary  PHYSICIAN ASSISTANT:  Nicholes Rough, PA-C   ANESTHESIA:   general  EBL:  1000 mL   BLOOD ADMINISTERED:none  DRAINS: ROUTINE   LOCAL MEDICATIONS USED:  NONE  SPECIMEN:  Source of Specimen:  PORTION OF THE ASCENDING AORTA  DISPOSITION OF SPECIMEN:  PATHOLOGY  COUNTS:  YES  DICTATION: .Dragon Dictation  PLAN OF CARE: Admit to inpatient   PATIENT DISPOSITION:  ICU - intubated and hemodynamically stable.   Delay start of Pharmacological VTE agent (>24hrs) due to surgical blood loss or risk of bleeding: yes

## 2018-07-20 NOTE — Anesthesia Procedure Notes (Signed)
Procedure Name: Intubation Date/Time: 07/20/2018 7:49 AM Performed by: Imagene Riches, CRNA Pre-anesthesia Checklist: Patient identified, Emergency Drugs available, Suction available and Patient being monitored Patient Re-evaluated:Patient Re-evaluated prior to induction Oxygen Delivery Method: Circle System Utilized Preoxygenation: Pre-oxygenation with 100% oxygen Induction Type: IV induction Ventilation: Mask ventilation without difficulty Laryngoscope Size: Miller and 3 Grade View: Grade I Tube type: Oral Tube size: 8.0 mm Number of attempts: 1 Airway Equipment and Method: Stylet and Oral airway Placement Confirmation: ETT inserted through vocal cords under direct vision,  positive ETCO2 and breath sounds checked- equal and bilateral Secured at: 23 cm Tube secured with: Tape Dental Injury: Teeth and Oropharynx as per pre-operative assessment

## 2018-07-20 NOTE — Progress Notes (Signed)
Pt having difficulty with weaning process. When RT assessess mechanics, pt becomes increasingly agitated/combative. Pt fully awake and alert, able to communicate with RN via writing board. RN asked pt what happens when we try to do the tests, pt writes," it hurts and I gag." RN administered zofran as well as pain medication to ease process. RN also called pt friend Waunita Schooner), with pt's OK. Pt nods that he understands exactly what he needs to do in order to get the breathing tube out.  RN will  communicate with RT and continue to monitor.

## 2018-07-20 NOTE — Transfer of Care (Signed)
Immediate Anesthesia Transfer of Care Note  Patient: Lawrence [name]  Procedure(s) Performed: REPLACMENT OF ASCENDING AORTA WITH 32MM GRAFT. HYPOTHERMIC CIRCULATORY ARREST. WHEAT PROCEDURE. (N/A Chest) TRANSESOPHAGEAL ECHOCARDIOGRAM (TEE) (N/A )  Patient Location: PACU  Anesthesia Type:General  Level of Consciousness: Patient remains intubated per anesthesia plan  Airway & Oxygen Therapy: Patient remains intubated per anesthesia plan and Patient placed on Ventilator (see vital sign flow sheet for setting)  Post-op Assessment: Report given to RN and Post -op Vital signs reviewed and stable  Post vital signs: Reviewed and stable  Last Vitals:  Vitals Value Taken Time  BP    Temp 35.1 C 07/20/2018  2:14 PM  Pulse 89 07/20/2018  2:14 PM  Resp 17 07/20/2018  2:14 PM  SpO2 97 % 07/20/2018  2:14 PM  Vitals shown include unvalidated device data.  Last Pain:  Vitals:   07/20/18 0604  PainSc: 3       Patients Stated Pain Goal: 3 (51/70/01 7494)  Complications: No apparent anesthesia complications

## 2018-07-20 NOTE — Progress Notes (Signed)
The weaning process was initiated once again. RT got to the last stage and pt. Again refused to cooperate and perform the necessary mechanics. Pt. Became combative and is complaining of pain. Pt. Placed back on full support for now.

## 2018-07-20 NOTE — Progress Notes (Signed)
Had to place pt. Back on initial settings. Pt. Became extremely agitated and would not cooperate to perform weaning parameters. RT and RN will try again.

## 2018-07-20 NOTE — Progress Notes (Signed)
  Echocardiogram Echocardiogram Transesophageal has been performed.  Lawrence [name] 07/20/2018, 9:17 AM

## 2018-07-20 NOTE — Progress Notes (Signed)
Wean protocol.

## 2018-07-20 NOTE — Anesthesia Procedure Notes (Addendum)
Central Venous Catheter Insertion Performed by: Belinda Block, MD, anesthesiologist Start/End9/07/2018 6:50 AM, 07/20/2018 7:05 AM Patient location: Pre-op. Preanesthetic checklist: patient identified, IV checked, site marked, risks and benefits discussed, surgical consent, monitors and equipment checked, pre-op evaluation and timeout performed Position: Trendelenburg Lidocaine 1% used for infiltration and patient sedated Hand hygiene performed , maximum sterile barriers used  and Seldinger technique used MAC introducer PA Cath depth:48 Procedure performed using ultrasound guided technique. Ultrasound Notes:anatomy identified and image(s) printed for medical record Attempts: 1 Following insertion, line sutured, dressing applied and Biopatch. Post procedure assessment: blood return through all ports  Patient tolerated the procedure well with no immediate complications.

## 2018-07-20 NOTE — H&P (Signed)
[address]       ,[place] [address]             [phone]                    Lawrence [name]  Medical Record #[id number] Date of Birth: January 13, 1951  Referring: Satira Sark, MD Primary Care: [name], MD Primary Cardiologist: [name], MD  Chief Complaint:    Chief Complaint  Patient presents with  . Thoracic Aortic Aneurysm    f/u to further discuss surgery  . Aortic Stenosis     History of Present Illness:    Lawrence [name] 67 y.o. male followed in the  office  dilated ascending aorta and bicuspid aortic valve.  The patient notes that in the spring of 2018 he was noted to have a soft murmur. Echocardiogram done at that time showed a bicuspid aortic valve.  In October 2018  the patient had some chest discomfort and was seen in the emergency room,  CTA of the chest was done that showed a dilated ascending aorta to 5.0 cm without evidence of dissection.  He was seen in the surgery office after this episode it was  as recommended to him to proceed with cardiac catheterization as a preop evaluation for possible root replacement and ascending aorta replacement in the face of a dilated ascending aorta with a bicuspid valve.  Cardiac catheterization was performed,  he was told all was okay so he assumed he did not need to return.   A follow-up CTA scan was done in the spring 2019 by cardiology  Patient has a history of hypertension but otherwise no significant cardiac history.   patient does have a family history of his  brother who at age 44 died suddenly at home found outside of the shower no autopsy was done    Current Activity/ Functional Status:  Patient is independent with mobility/ambulation, transfers, ADL's, IADL's.  Patient is disabled from chronic back pain     Current Activity/ Functional Status:  Patient is independent with mobility/ambulation, transfers, ADL's, IADL's.   Zubrod Score: At the time of  surgery this patient's most appropriate activity status/level should be described as: [x]     0    Normal activity, no symptoms []     1    Restricted in physical strenuous activity but ambulatory, able to do out light work []     2    Ambulatory and capable of self care, unable to do work activities, up and about               >50 % of waking hours                              []     3    Only limited self care, in bed greater than 50% of waking hours []     4    Completely disabled, no self care, confined to bed or chair []     5    Moribund   Past Medical History:  Diagnosis Date  . Aortic atherosclerosis ([place])   . Aortic insufficiency   . Aortic stenosis   . Arthritis   . Bicuspid aortic valve    a. mild AS, mild-mod AI by echo 03/2017, 5cm thoracic aortic aneurysm by CT 07/2017.  Marland Kitchen Dyspnea   . Essential hypertension   .  GERD (gastroesophageal reflux disease)   . Gout   . Heart murmur   . Thoracic aortic aneurysm ([place])    a. 07/2017: 5cm by CT.    Past Surgical History:  Procedure Laterality Date  . COLONOSCOPY N/A 08/29/2016   Procedure: COLONOSCOPY;  Surgeon: Danie Binder, MD;  Location: [facility];  Service: Endoscopy;  Laterality: N/A;  1030  . COLONOSCOPY N/A 03/09/2018   Procedure: COLONOSCOPY;  Surgeon: Danie Binder, MD;  Location: [facility];  Service: Endoscopy;  Laterality: N/A;  1:15pm  . HERNIA REPAIR     umbilical  . MASS EXCISION N/A 03/18/2018   Procedure: EXCISION CYST ON BACK 2CM;  Surgeon: [name], MD;  Location: [facility];  Service: General;  Laterality: N/A;  . POLYPECTOMY  03/09/2018   Procedure: POLYPECTOMY;  Surgeon: Danie Binder, MD;  Location: [facility];  Service: Endoscopy;;  ascending, transverse x2; descending, sigmoid  . RIGHT/LEFT HEART CATH AND CORONARY ANGIOGRAPHY N/A 09/05/2017   Procedure: RIGHT/LEFT HEART CATH AND CORONARY ANGIOGRAPHY;  Surgeon: Larey Dresser, MD;  Location: [facility];  Service: Cardiovascular;   Laterality: N/A;    Family History  Problem Relation Age of Onset  . Colon polyps Sister   . Heart attack Mother   . Stomach cancer Father   . Colon cancer Neg Hx      Social History   Tobacco Use  Smoking Status Former Smoker  . Packs/day: 2.50  . Years: 25.00  . Pack years: 62.50  . Types: Cigarettes  . Last attempt to quit: 08/01/1981  . Years since quitting: 36.9  Smokeless Tobacco Never Used    Social History   Substance and Sexual Activity  Alcohol Use Yes   Comment: Occasionally, with sporting events up to 8 at a time.     Allergies  Allergen Reactions  . Ciprofloxacin   . Morphine And Related Other (See Comments)    Caused patient to pass out.     Current Facility-Administered Medications  Medication Dose Route Frequency Provider Last Rate Last Dose  . cefUROXime (ZINACEF) 1.5 g in sodium chloride 0.9 % 100 mL IVPB  1.5 g Intravenous To OR [name], MD      . cefUROXime (ZINACEF) 750 mg in sodium chloride 0.9 % 100 mL IVPB  750 mg Intravenous To OR [name], MD      . chlorhexidine (HIBICLENS) 4 % liquid 2 application  30 mL Topical UD [name], MD      . dexmedetomidine (PRECEDEX) 400 MCG/100ML (4 mcg/mL) infusion  0.1-0.7 mcg/kg/hr Intravenous To OR [name], MD      . DOPamine (INTROPIN) 800 mg in dextrose 5 % 250 mL (3.2 mg/mL) infusion  0-10 mcg/kg/min Intravenous To OR [name], MD      . EPINEPHrine (ADRENALIN) 4 mg in dextrose 5 % 250 mL (0.016 mg/mL) infusion  0-10 mcg/min Intravenous To OR [name], MD      . heparin 2,500 Units, papaverine 30 mg in electrolyte-148 (PLASMALYTE-148) 500 mL irrigation   Irrigation To OR [name], MD      . heparin 30,000 units/NS 1000 mL solution for CELLSAVER   Other To OR [name], MD      . insulin regular (NOVOLIN R,HUMULIN R) 100 Units in sodium chloride 0.9 % 100 mL (1 Units/mL) infusion   Intravenous To OR [name], MD        .  magnesium sulfate (IV Push/IM) injection 40 mEq  40 mEq Other To OR [name], MD      . metoprolol tartrate (LOPRESSOR) tablet 12.5 mg  12.5 mg Oral Once [name], MD      . milrinone (PRIMACOR) 20 MG/100 ML (0.2 mg/mL) infusion  0.3 mcg/kg/min Intravenous To OR [name], MD      . nitroGLYCERIN 50 mg in dextrose 5 % 250 mL (0.2 mg/mL) infusion  2-200 mcg/min Intravenous To OR [name], MD      . phenylephrine (NEO-SYNEPHRINE) 20 mg in sodium chloride 0.9 % 250 mL (0.08 mg/mL) infusion  30-200 mcg/min Intravenous To OR [name], MD      . potassium chloride injection 80 mEq  80 mEq Other To OR [name], MD      . tranexamic acid (CYKLOKAPRON) 2,500 mg in sodium chloride 0.9 % 250 mL (10 mg/mL) infusion  1.5 mg/kg/hr Intravenous To OR [name], MD      . tranexamic acid (CYKLOKAPRON) bolus via infusion - over 30 minutes 1,431 mg  15 mg/kg Intravenous To OR [name], MD      . tranexamic acid (CYKLOKAPRON) pump prime solution 191 mg  2 mg/kg Intracatheter To OR [name], MD      . vancomycin (VANCOCIN) 1,250 mg in sodium chloride 0.9 % 250 mL IVPB  1,250 mg Intravenous To OR [name], MD       Facility-Administered Medications Ordered in Other Encounters  Medication Dose Route Frequency Provider Last Rate Last Dose  . fentaNYL (SUBLIMAZE) injection   Intravenous Anesthesia Intra-op Imagene Riches, CRNA   50 mcg at 07/20/18 0701  . lactated ringers infusion    Continuous PRN Imagene Riches, CRNA      . lactated ringers infusion    Continuous PRN Imagene Riches, CRNA      . lactated ringers infusion    Continuous PRN Imagene Riches, CRNA      . midazolam (VERSED) 5 MG/5ML injection   Intravenous Anesthesia Intra-op Imagene Riches, CRNA   1 mg at 07/20/18 1324    Pertinent items are noted in HPI.   Review of Systems:     Cardiac Review of Systems: [Y] = yes  or   [ N ] = no   Chest Pain [ n ]   Resting SOB [ n ] Exertional SOB  [n]  Orthopnea [ n]   Pedal Edema [n ]    Palpitations [n ] Syncope  [n]   Presyncope [n  ]   General Review of Systems: [Y] = yes [  ]=no Constitional: recent weight change [  ];  Wt loss over the last 3 months [   ] anorexia [  ]; fatigue [  ]; nausea [  ]; night sweats [  ]; fever [  ]; or chills [  ];           Eye : blurred vision [  ]; diplopia [   ]; vision changes [  ];  Amaurosis fugax[  ]; Resp: cough [  ];  wheezing[  ];  hemoptysis[  ]; shortness of breath[  ]; paroxysmal nocturnal dyspnea[  ]; dyspnea on exertion[  ]; or orthopnea[  ];  GI:  gallstones[  ], vomiting[  ];  dysphagia[  ]; melena[  ];  hematochezia [  ]; heartburn[  ];   Hx of  Colonoscopy[  ]; GU: kidney stones [  ];  hematuria[  ];   dysuria [  ];  nocturia[  ];  history of     obstruction [  ]; urinary frequency [  ]             Skin: rash, swelling[  ];, hair loss[  ];  peripheral edema[  ];  or itching[  ]; Musculosketetal: myalgias[  ];  joint swelling[  ];  joint erythema[  ];  joint pain[  ];  back pain[  ];  Heme/Lymph: bruising[  ];  bleeding[  ];  anemia[  ];  Neuro: TIA[  ];  headaches[  ];  stroke[  ];  vertigo[  ];  seizures[  ];   paresthesias[  ];  difficulty walking[  ];  Psych:depression[  ]; anxiety[  ];  Endocrine: diabetes[  ];  thyroid dysfunction[  ];  Immunizations: Flu up to date [  ]; Pneumococcal up to date [  ];  Other:             Dental: Patient has upper dentures, all teeth have been removed     PHYSICAL EXAMINATION: BP (!) 164/86   Pulse (!) 48   Temp 98.6 F (37 C)   Resp 20   Ht 5\' 10"  (1.778 m)   Wt 95.4 kg   SpO2 100%   BMI 30.17 kg/m  General appearance: alert, cooperative and appears stated age Head: Normocephalic, without obvious abnormality, atraumatic Neck: no adenopathy, no carotid bruit, no JVD, supple, symmetrical, trachea midline and thyroid not enlarged, symmetric, no tenderness/mass/nodules Lymph nodes: Cervical,  supraclavicular, and axillary nodes normal. Resp: clear to auscultation bilaterally Back: symmetric, no curvature. ROM normal. No CVA tenderness. Cardio: regular rate and rhythm, S1, S2 normal, n of aortic insufficienc mild murmur, click, rub or gallop GI: soft, non-tender; bowel sounds normal; no masses,  no organomegaly Extremities: extremities normal, atraumatic, no cyanosis or edema Neurologic: Grossly normal Patient has no signs or symptoms of Marfan syndrome, negative thumb/fist sign  Diagnostic Studies & Laboratory data:      Recent Lab Findings: Lab Results  Component Value Date   WBC 5.2 07/16/2018   HGB 15.9 07/16/2018   HCT 47.7 07/16/2018   PLT 171 07/16/2018   GLUCOSE 110 (H) 07/16/2018   CHOL 193 09/02/2017   TRIG 133 09/02/2017   HDL 37 (L) 09/02/2017   LDLCALC 129 (H) 09/02/2017   ALT 32 07/16/2018   AST 24 07/16/2018   NA 139 07/16/2018   K 4.3 07/16/2018   CL 108 07/16/2018   CREATININE 1.02 07/16/2018   BUN 18 07/16/2018   CO2 20 (L) 07/16/2018   TSH 1.304 09/02/2017   INR 1.05 07/16/2018   HGBA1C 6.0 (H) 07/16/2018   CATH: 09/05/2017  Fick Cardiac Output 4.76 L/min  Fick Cardiac Output Index 2.23 (L/min)/BSA  RA A Wave 8 mmHg  RA V Wave 7 mmHg  RA Mean 5 mmHg  RV Systolic Pressure 29 mmHg  RV Diastolic Pressure 1 mmHg  RV EDP 7 mmHg  PA Systolic Pressure 30 mmHg  PA Diastolic Pressure 11 mmHg  PA Mean 19 mmHg  PW A Wave 18 mmHg  PW V Wave 15 mmHg  PW Mean 13 mmHg  AO Systolic Pressure 937 mmHg  AO Diastolic Pressure 83 mmHg  AO Mean 112 mmHg  QP/QS 1  TPVR Index 8.5 HRUI  TSVR Index 50.09 HRUI  PVR SVR Ratio 0.06  TPVR/TSVR Ratio 0.17   Diagnostic  Dominance: Right  Left Main  No  significant coronary disease.  Left Anterior Descending  No significant coronary disease.  Ramus Intermedius  Moderate ramus. No significant coronary disease.  Left Circumflex  No significant coronary disease.  Right Coronary Artery  No significant  coronary disease.     ECHO: ATTENDING    [name], M.D.  ORDERING     [name], M.D.  REFERRING    [name], M.D.  SONOGRAPHER  [facility]  PERFORMING   [facility], Deneise Lever [place]  cc:  ------------------------------------------------------------------- LV EF: 55% -   60%  ------------------------------------------------------------------- Indications:      Aortic stenosis 424.1.  ------------------------------------------------------------------- History:   PMH:  Former Smoker. Thoracic aortic aneurysm  Risk factors:  Hypertension.  ------------------------------------------------------------------- Study Conclusions  - Left ventricle: The cavity size was normal. Wall thickness was   increased in a pattern of moderate LVH. Systolic function was   normal. The estimated ejection fraction was in the range of 55%   to 60%. The study is not technically sufficient to allow   evaluation of LV diastolic function. - Aortic valve: Probable bicuspid aortic valve. There was mild to   moderate regurgitation. Valve area (VTI): 1.8 cm^2. Valve area   (Vmax): 1.73 cm^2. Valve area (Vmean): 2.48 cm^2. - Aorta: The visualized portion of the proximal ascending aorta is   moderately enlarged at 4.5 cm. Aortic root dimension: 41 mm (ED). - Aortic root: The aortic root was mildly dilated. - Technically adequate study.  ------------------------------------------------------------------- Study data:  Comparison was made to the study of 03/13/2017.  Study status:  Routine.  Procedure:  Transthoracic echocardiography. Image quality was adequate.          Transthoracic echocardiography.  M-mode, complete 2D, spectral Doppler, and color Doppler.  Birthdate:  Patient birthdate: 1951/02/11.  Age:  Patient is 67 yr old.  Sex:  Gender: male.    BMI: 29.6 kg/m^2.  Patient status:  Outpatient.  Study date:  Study date: 02/24/2018. Study time: 10:29 AM.  Location:  Echo  laboratory.  -------------------------------------------------------------------  ------------------------------------------------------------------- Left ventricle:  The cavity size was normal. Wall thickness was increased in a pattern of moderate LVH. Systolic function was normal. The estimated ejection fraction was in the range of 55% to 60%. Images were inadequate for LV wall motion assessment. The study is not technically sufficient to allow evaluation of LV diastolic function.  ------------------------------------------------------------------- Aortic valve:   Normal thickness leaflets. Probable bicuspid aortic valve.  Doppler:   There was no stenosis.   There was mild to moderate regurgitation.    VTI ratio of LVOT to aortic valve: 0.52. Valve area (VTI): 1.8 cm^2. Indexed valve area (VTI): 0.83 cm^2/m^2. Peak velocity ratio of LVOT to aortic valve: 0.5. Valve area (Vmax): 1.73 cm^2. Indexed valve area (Vmax): 0.8 cm^2/m^2. Mean velocity ratio of LVOT to aortic valve: 0.72. Valve area (Vmean): 2.48 cm^2. Indexed valve area (Vmean): 1.14 cm^2/m^2. Mean gradient (S): 9 mm Hg. Peak gradient (S): 17 mm Hg.  ------------------------------------------------------------------- Aorta:  The visualized portion of the proximal ascending aorta is moderately enlarged at 4.5 cm. Aortic root: The aortic root was mildly dilated.  ------------------------------------------------------------------- Mitral valve:   Normal thickness leaflets .  Doppler:   There was no evidence for stenosis.   There was no significant regurgitation.   ------------------------------------------------------------------- Left atrium:  The atrium was normal in size.  ------------------------------------------------------------------- Atrial septum:  Poorly visualized.  ------------------------------------------------------------------- Right ventricle:  The cavity size was normal. Systolic function  was normal.  ------------------------------------------------------------------- Pulmonic valve:   Not well  visualized.  Doppler:   There was no evidence for stenosis.   There was no significant regurgitation.   ------------------------------------------------------------------- Tricuspid valve:   Normal thickness leaflets.  Doppler:   There was no evidence for stenosis.   There was no significant regurgitation.   ------------------------------------------------------------------- Pulmonary artery:    Systolic pressure could not be accurately estimated.   Inadequate TR jet.  ------------------------------------------------------------------- Right atrium:  The atrium was normal in size.  ------------------------------------------------------------------- Pericardium:  There was no pericardial effusion.  ------------------------------------------------------------------- Systemic veins:  IVC poorly visualized.  ------------------------------------------------------------------- Post procedure conclusions Ascending Aorta:  - The visualized portion of the proximal ascending aorta is   moderately enlarged at 4.5 cm.  ------------------------------------------------------------------- Measurements   Left ventricle                           Value           Reference  LV ID, ED, PLAX chordal                  44     mm       43 - 52  LV ID, ES, PLAX chordal                  30     mm       23 - 38  LV fx shortening, PLAX chordal           32     %        >=29  LV PW thickness, ED                      13.2   mm       ---------  IVS/LV PW ratio, ED                      1.02            <=1.3    Ventricular septum                       Value           Reference  IVS thickness, ED                        13.5   mm       ---------    LVOT                                     Value           Reference  LVOT ID, S                               21     mm       ---------  LVOT area                                 3.46   cm^2     ---------  LVOT peak velocity, S                    103.84 cm/s     ---------  LVOT mean velocity,  S                    100    cm/s     ---------  LVOT VTI, S                              26.98  cm       ---------  Stroke volume (SV), LVOT DP              93.5   ml       ---------  Stroke index (SV/bsa), LVOT DP           43     ml/m^2   ---------    Aortic valve                             Value           Reference  Aortic valve peak velocity, S            206.31 cm/s     ---------  Aortic valve mean velocity, S            139.35 cm/s     ---------  Aortic valve VTI, S                      51.47  cm       ---------  Aortic mean gradient, S                  9      mm Hg    ---------  Aortic peak gradient, S                  17     mm Hg    ---------  VTI ratio, LVOT/AV                       0.52            ---------  Aortic valve area, VTI                   1.8    cm^2     ---------  Aortic valve area/bsa, VTI               0.83   cm^2/m^2 ---------  Velocity ratio, peak, LVOT/AV            0.5             ---------  Aortic valve area, peak velocity         1.73   cm^2     ---------  Aortic valve area/bsa, peak              0.8    cm^2/m^2 ---------  velocity  Velocity ratio, mean, LVOT/AV            0.72            ---------  Aortic valve area, mean velocity         2.48   cm^2     ---------  Aortic valve area/bsa, mean              1.14   cm^2/m^2 ---------  velocity  Aortic regurg pressure half-time         633    ms       ---------  Aorta                                    Value           Reference  Aortic root ID, ED                       41     mm       ---------    Left atrium                              Value           Reference  LA ID, A-P, ES                           37     mm       ---------  LA volume/bsa, S                         27.9   ml/m^2   ---------    Right ventricle                          Value            Reference  TAPSE                                    17.3   mm       ---------  RV s&', lateral, S                        11.1   cm/s     ---------  Legend: (L)  and  (H)  mark values outside specified reference range.  ------------------------------------------------------------------- Prepared and Electronically Authenticated by  Kerry Hough, M.D. 2019-04-16T14:32:51       CLINICAL DATA:  Ascending thoracic aortic aneurysm  EXAM: CT ANGIOGRAPHY CHEST WITH CONTRAST  TECHNIQUE: Multidetector CT imaging of the chest was performed using the standard protocol during bolus administration of intravenous contrast. Multiplanar CT image reconstructions and MIPs were obtained to evaluate the vascular anatomy.  CONTRAST:  168mL ISOVUE-370 IOPAMIDOL (ISOVUE-370) INJECTION 76%  COMPARISON:  08/04/2017  FINDINGS: Cardiovascular: Ascending thoracic aorta has a stable fusiform aneurysm measuring 5 cm in transaxial dimension. Sino-tubular junction measures 3.4 cm. Sinus of Valsalva measures 4.6 cm. Patent 3 vessel arch anatomy. Negative for dissection or mediastinal hemorrhage. Normal heart size. No pericardial effusion. No large central pulmonary embolus.  Mediastinum/Nodes: No enlarged mediastinal, hilar, or axillary lymph nodes. Thyroid gland, trachea, and esophagus demonstrate no significant findings.  Lungs/Pleura: Minor apical paraseptal emphysema dependent bibasilar atelectasis. No focal pneumonia, collapse or consolidation. Negative for edema or interstitial process. No pleural abnormality, effusion or pneumothorax. Trachea and central airways are patent.  Upper Abdomen: No acute abnormality.  Musculoskeletal: No acute osseous finding.  Review of the MIP images confirms the above findings.  IMPRESSION: Stable 5 cm ascending thoracic aorta fusiform aneurysm.  Recommend semi-annual imaging followup by CTA or MRA and referral to cardiothoracic  surgery if not already obtained. This recommendation follows 2010 ACCF/AHA/AATS/ACR/ASA/SCA/SCAI/SIR/STS/SVM Guidelines for the Diagnosis and Management of Patients With  Thoracic Aortic Disease. Circulation. 2010; 121: [id number]  No acute intrathoracic process.  Bibasilar atelectasis/scarring  Emphysema (ICD10-J43.9).   Electronically Signed   By: Jerilynn Mages.  Shick M.D.   On: 02/24/2018 14:00   I have independently reviewed the above radiology studies  and reviewed the findings with the patient.    CLINICAL DATA:  Non radiating LEFT chest pain for 30 minutes. Flu symptoms. History of hypertension.  EXAM: CT ANGIOGRAPHY CHEST, ABDOMEN AND PELVIS  TECHNIQUE: Multidetector CT imaging through the chest, abdomen and pelvis was performed using the standard protocol during bolus administration of intravenous contrast. Multiplanar reconstructed images and MIPs were obtained and reviewed to evaluate the vascular anatomy.  CONTRAST:  100 cc Isovue 370  COMPARISON:  Chest radiograph August 04, 2017 at 1610 hours an CT abdomen and pelvis June 17, 2013  FINDINGS: CTA CHEST FINDINGS  CARDIOVASCULAR: Ascending aorta is 5 cm in transaxial dimension. Mild calcific atherosclerosis. No intrinsic density on noncontrast CT. Homogeneous contrast opacification of thoracic aorta without dissection, aneurysm, luminal irregularity, periaortic fluid collections, or contrast extravasation. Heart size is normal. Mild coronary artery calcification. No pericardial effusion. No central pulmonary embolism.  MEDIASTINUM/NODES: No mediastinal mass or lymphadenopathy by CT size criteria. Mildly patulous thoracic esophagus with air-fluid level associated with reflux.  LUNGS/PLEURA: Tracheobronchial tree is patent, no pneumothorax. Mild centrilobular emphysema. Apical bullous changes. No pleural effusions, focal consolidations, pulmonary nodules or masses. Bibasilar bandlike  densities.  MUSCULOSKELETAL: Non-suspicious.  Review of the MIP images confirms the above findings.  CTA ABDOMEN AND PELVIS FINDINGS  VASCULAR  Aorta: Abdominal aorta is normal caliber. Mildly tortuous course. Mild calcific atherosclerosis Homogeneous contrast opacification of aortoiliac vessels without dissection, aneurysm, luminal irregularity, periaortic fluid collections, or contrast extravasation.  Celiac: Patent.  SMA: Patent.  Renals: Patent.  IMA: Patent.  Inflow: Negative.  Veins: Negative, not tailored for evaluation.  Review of the MIP images confirms the above findings.  NON-VASCULAR  HEPATOBILIARY: Liver and gallbladder are normal.  PANCREAS: Normal.  SPLEEN: Normal considering arterial phase.  ADRENALS/URINARY TRACT: Kidneys are orthotopic, demonstrating symmetric enhancement. No nephrolithiasis, hydronephrosis or solid renal masses. The unopacified ureters are normal in course and caliber. Urinary bladder is well distended and unremarkable. Normal adrenal glands.  STOMACH/BOWEL: The stomach, small and large bowel are normal in course and caliber without inflammatory changes, sensitivity decreased without oral contrast. Mild colonic diverticulosis. Normal appendix.  VASCULAR/LYMPHATIC: No lymphadenopathy by CT size criteria.  REPRODUCTIVE: Mild prostatomegaly.  OTHER: No intraperitoneal free fluid or free air.  MUSCULOSKELETAL: Nonacute. Small fat containing LEFT greater than RIGHT inguinal hernias. Subcentimeter sclerotic presumed bone island LEFT ischial tuberosity. Broad thoracolumbar dextroscoliosis.  Review of the MIP images confirms the above findings.  IMPRESSION: CTA CHEST:  1. 5 cm fusiform aneurysm ascending aorta. Recommend semi-annual imaging followup by CTA or MRA and referral to cardiothoracic surgery if not already obtained. This recommendation follows  2010 ACCF/AHA/AATS/ACR/ASA/SCA/SCAI/SIR/STS/SVM Guidelines for the Diagnosis and Management of Patients With Thoracic Aortic Disease. Circulation. 2010; 121: Z610-R604. 2. Mild emphysema.  Bibasilar atelectasis/scarring.  CTA ABDOMEN AND PELVIS:  1. No acute vascular process or or acute intra-abdominal/pelvic disease. 2. Mild colonic diverticulosis. Aortic Atherosclerosis (ICD10-I70.0), Emphysema (ICD10-J43.9), Aortic aneurysm NOS (ICD10-I71.9).   Electronically Signed   By: Elon Alas M.D.   On: 08/04/2017 17:34        Assessment / Plan:   5 cm ascending aorta without significant coronary artery disease and a bicuspid aortic valve with some sclerosis and mild to moderate insufficiency and  with positive family history suggestive of acute aortic dissection.  I discussed with the patient proceeding ascending aorta the aortic root and aortic valve replacement, with the size of 5 cm of bicuspid valve a positive family history of a brother who died suddenly in a young age possible aortic dissection.  Risks and options of surgery were discussed in detail.  We discussed use of pericardial versus mechanical valves.  Patient prefers not to take Coumadin and prefers a tissue valve.   The goals risks and alternatives of the planned surgical procedure Procedure(s) with comments: BENTALL PROCEDURE (N/A) - POSSIBLE CIRC ARREST ASCENDING AORTIC ROOT REPLACEMENT (N/A) TRANSESOPHAGEAL ECHOCARDIOGRAM (TEE) (N/A)  have been discussed with the patient in detail. The risks of the procedure including death, infection, stroke, myocardial infarction, bleeding, blood transfusion have all been discussed specifically.  I have quoted [name] a 3 % of perioperative mortality and a complication rate as high as 40 %. The patient's questions have been answered.DOMINIQ FONTAINE is willing  to proceed with the planned procedure.   The [employer] [facility]) and the [facility]) have issued a statement to clarify 2 previous guidelines from the [facility], [place], and collaborating societies addressing the risk of aortic dissection in patients with bicuspid aortic valves (BAV) and severe aortic enlargement. The 2 guidelines differ with regard to the recommended threshold of aortic root or ascending aortic dilatation that would justify surgical intervention in patients with bicuspid aortic valves. This new statement of clarification uses the ACC/AHA revised structure for delineating the Class of Recommendation and Level of Evidence to provide recommendation that replace those contained in Section 9.2.2.1 of the thoracic aortic disease guidelines and Section 5.1.3 of the valvular heart disease guideline. New recommendations in intervention in patients with BAV and dilatation of the aortic root (sinuses) or ascending aorta include:  . Operative intervention to repair or replace the aortic root (sinuses) or replace the ascending aorta is indicated in asymptomatic patients with BAV if the diameter of the aortic root or ascending aorta is 5.5 cm or greater. (Class of recommendation 1, Level of evidence B-NR).  Marland Kitchen Operative intervention to repair or replace the aortic root (sinuses) or replace the ascending aorta is reasonable in asymptomatic patients with BAV if the diameter of the aortic root or ascending aorta is 5.0 cm or greater and an additional risk factor for dissection is present or if the patient is at low surgical risk and the surgery is performed by an experienced aortic surgical team in a center with established expertise in these procedures. (Class of recommendation IIa; Level of Evidence B-NR).  . Replacement of the ascending aorta is reasonable in patients with BAV undergoing AVR because of severe aortic stenosis or aortic regurgitation when the diameter of the ascending aorta is greater than 4.5 cm (Class of recommendation IIa; Level of evidence C-EO).  Citation:  Donnamarie Poag, Randolm Idol, et al. Surgery for aortic dilatation in patients with bicuspid aortic valves. A statement of clarification from the [employer]/American Heart Association Task Force on Clinical Practice Guidelines. [Published online ahead of print October 14, 2014]. Circulation. doi: 10.1161/CIR.0000000000000331.      [name] MD      [address] [place] [address] Office [phone]   Beeper [phone]  07/20/2018 7:10 AM

## 2018-07-21 ENCOUNTER — Other Ambulatory Visit: Payer: Self-pay

## 2018-07-21 ENCOUNTER — Encounter (HOSPITAL_COMMUNITY): Payer: Self-pay | Admitting: Cardiothoracic Surgery

## 2018-07-21 ENCOUNTER — Inpatient Hospital Stay (HOSPITAL_COMMUNITY): Payer: Medicare HMO

## 2018-07-21 LAB — POCT I-STAT 3, ART BLOOD GAS (G3+)
Acid-Base Excess: 3 mmol/L — ABNORMAL HIGH (ref 0.0–2.0)
Acid-base deficit: 1 mmol/L (ref 0.0–2.0)
Acid-base deficit: 4 mmol/L — ABNORMAL HIGH (ref 0.0–2.0)
Acid-base deficit: 5 mmol/L — ABNORMAL HIGH (ref 0.0–2.0)
Bicarbonate: 20.9 mmol/L (ref 20.0–28.0)
Bicarbonate: 21.3 mmol/L (ref 20.0–28.0)
Bicarbonate: 23 mmol/L (ref 20.0–28.0)
Bicarbonate: 26 mmol/L (ref 20.0–28.0)
Bicarbonate: 28.5 mmol/L — ABNORMAL HIGH (ref 20.0–28.0)
O2 Saturation: 100 %
O2 Saturation: 100 %
O2 Saturation: 100 %
O2 Saturation: 97 %
O2 Saturation: 97 %
Patient temperature: 36.9
Patient temperature: 36.9
TCO2: 22 mmol/L (ref 22–32)
TCO2: 23 mmol/L (ref 22–32)
TCO2: 24 mmol/L (ref 22–32)
TCO2: 27 mmol/L (ref 22–32)
TCO2: 30 mmol/L (ref 22–32)
pCO2 arterial: 34.8 mmHg (ref 32.0–48.0)
pCO2 arterial: 39.6 mmHg (ref 32.0–48.0)
pCO2 arterial: 41.3 mmHg (ref 32.0–48.0)
pCO2 arterial: 47.1 mmHg (ref 32.0–48.0)
pCO2 arterial: 47.9 mmHg (ref 32.0–48.0)
pH, Arterial: 7.311 — ABNORMAL LOW (ref 7.350–7.450)
pH, Arterial: 7.339 — ABNORMAL LOW (ref 7.350–7.450)
pH, Arterial: 7.343 — ABNORMAL LOW (ref 7.350–7.450)
pH, Arterial: 7.39 (ref 7.350–7.450)
pH, Arterial: 7.429 (ref 7.350–7.450)
pO2, Arterial: 254 mmHg — ABNORMAL HIGH (ref 83.0–108.0)
pO2, Arterial: 279 mmHg — ABNORMAL HIGH (ref 83.0–108.0)
pO2, Arterial: 439 mmHg — ABNORMAL HIGH (ref 83.0–108.0)
pO2, Arterial: 94 mmHg (ref 83.0–108.0)
pO2, Arterial: 99 mmHg (ref 83.0–108.0)

## 2018-07-21 LAB — GLUCOSE, CAPILLARY
Glucose-Capillary: 138 mg/dL — ABNORMAL HIGH (ref 70–99)
Glucose-Capillary: 142 mg/dL — ABNORMAL HIGH (ref 70–99)
Glucose-Capillary: 148 mg/dL — ABNORMAL HIGH (ref 70–99)
Glucose-Capillary: 151 mg/dL — ABNORMAL HIGH (ref 70–99)
Glucose-Capillary: 161 mg/dL — ABNORMAL HIGH (ref 70–99)
Glucose-Capillary: 162 mg/dL — ABNORMAL HIGH (ref 70–99)
Glucose-Capillary: 213 mg/dL — ABNORMAL HIGH (ref 70–99)

## 2018-07-21 LAB — POCT I-STAT, CHEM 8
BUN: 27 mg/dL — ABNORMAL HIGH (ref 8–23)
BUN: 26 mg/dL — ABNORMAL HIGH (ref 8–23)
BUN: 22 mg/dL (ref 8–23)
BUN: 25 mg/dL — ABNORMAL HIGH (ref 8–23)
BUN: 27 mg/dL — ABNORMAL HIGH (ref 8–23)
BUN: 25 mg/dL — ABNORMAL HIGH (ref 8–23)
BUN: 32 mg/dL — ABNORMAL HIGH (ref 8–23)
Calcium, Ion: 1.28 mmol/L (ref 1.15–1.40)
Calcium, Ion: 1.24 mmol/L (ref 1.15–1.40)
Calcium, Ion: 0.93 mmol/L — ABNORMAL LOW (ref 1.15–1.40)
Calcium, Ion: 1.28 mmol/L (ref 1.15–1.40)
Calcium, Ion: 1.17 mmol/L (ref 1.15–1.40)
Calcium, Ion: 1.07 mmol/L — ABNORMAL LOW (ref 1.15–1.40)
Calcium, Ion: 1.08 mmol/L — ABNORMAL LOW (ref 1.15–1.40)
Chloride: 104 mmol/L (ref 98–111)
Chloride: 105 mmol/L (ref 98–111)
Chloride: 100 mmol/L (ref 98–111)
Chloride: 106 mmol/L (ref 98–111)
Chloride: 104 mmol/L (ref 98–111)
Chloride: 102 mmol/L (ref 98–111)
Chloride: 101 mmol/L (ref 98–111)
Creatinine, Ser: 0.8 mg/dL (ref 0.61–1.24)
Creatinine, Ser: 0.8 mg/dL (ref 0.61–1.24)
Creatinine, Ser: 0.5 mg/dL — ABNORMAL LOW (ref 0.61–1.24)
Creatinine, Ser: 0.8 mg/dL (ref 0.61–1.24)
Creatinine, Ser: 0.9 mg/dL (ref 0.61–1.24)
Creatinine, Ser: 0.7 mg/dL (ref 0.61–1.24)
Creatinine, Ser: 1 mg/dL (ref 0.61–1.24)
Glucose, Bld: 119 mg/dL — ABNORMAL HIGH (ref 70–99)
Glucose, Bld: 118 mg/dL — ABNORMAL HIGH (ref 70–99)
Glucose, Bld: 100 mg/dL — ABNORMAL HIGH (ref 70–99)
Glucose, Bld: 122 mg/dL — ABNORMAL HIGH (ref 70–99)
Glucose, Bld: 139 mg/dL — ABNORMAL HIGH (ref 70–99)
Glucose, Bld: 144 mg/dL — ABNORMAL HIGH (ref 70–99)
Glucose, Bld: 200 mg/dL — ABNORMAL HIGH (ref 70–99)
HCT: 41 % (ref 39.0–52.0)
HCT: 39 % (ref 39.0–52.0)
HCT: 29 % — ABNORMAL LOW (ref 39.0–52.0)
HCT: 37 % — ABNORMAL LOW (ref 39.0–52.0)
HCT: 28 % — ABNORMAL LOW (ref 39.0–52.0)
HCT: 28 % — ABNORMAL LOW (ref 39.0–52.0)
HCT: 33 % — ABNORMAL LOW (ref 39.0–52.0)
Hemoglobin: 13.9 g/dL (ref 13.0–17.0)
Hemoglobin: 13.3 g/dL (ref 13.0–17.0)
Hemoglobin: 12.6 g/dL — ABNORMAL LOW (ref 13.0–17.0)
Hemoglobin: 9.9 g/dL — ABNORMAL LOW (ref 13.0–17.0)
Hemoglobin: 9.5 g/dL — ABNORMAL LOW (ref 13.0–17.0)
Hemoglobin: 9.5 g/dL — ABNORMAL LOW (ref 13.0–17.0)
Hemoglobin: 11.2 g/dL — ABNORMAL LOW (ref 13.0–17.0)
Potassium: 4.2 mmol/L (ref 3.5–5.1)
Potassium: 4.3 mmol/L (ref 3.5–5.1)
Potassium: 4.2 mmol/L (ref 3.5–5.1)
Potassium: 4.2 mmol/L (ref 3.5–5.1)
Potassium: 5.9 mmol/L — ABNORMAL HIGH (ref 3.5–5.1)
Potassium: 3.9 mmol/L (ref 3.5–5.1)
Potassium: 4 mmol/L (ref 3.5–5.1)
Sodium: 141 mmol/L (ref 135–145)
Sodium: 140 mmol/L (ref 135–145)
Sodium: 140 mmol/L (ref 135–145)
Sodium: 142 mmol/L (ref 135–145)
Sodium: 137 mmol/L (ref 135–145)
Sodium: 139 mmol/L (ref 135–145)
Sodium: 144 mmol/L (ref 135–145)
TCO2: 27 mmol/L (ref 22–32)
TCO2: 24 mmol/L (ref 22–32)
TCO2: 25 mmol/L (ref 22–32)
TCO2: 28 mmol/L (ref 22–32)
TCO2: 28 mmol/L (ref 22–32)
TCO2: 23 mmol/L (ref 22–32)
TCO2: 23 mmol/L (ref 22–32)

## 2018-07-21 LAB — BASIC METABOLIC PANEL
Anion gap: 6 (ref 5–15)
BUN: 23 mg/dL (ref 8–23)
CO2: 21 mmol/L — ABNORMAL LOW (ref 22–32)
Calcium: 7.9 mg/dL — ABNORMAL LOW (ref 8.9–10.3)
Chloride: 111 mmol/L (ref 98–111)
Creatinine, Ser: 0.97 mg/dL (ref 0.61–1.24)
GFR calc Af Amer: >60 mL/min (ref >60)
GFR calc non Af Amer: >60 mL/min (ref >60)
Glucose, Bld: 142 mg/dL — ABNORMAL HIGH (ref 70–99)
Potassium: 4.4 mmol/L (ref 3.5–5.1)
Sodium: 138 mmol/L (ref 135–145)

## 2018-07-21 LAB — CBC
HCT: 36.3 % — ABNORMAL LOW (ref 39.0–52.0)
HCT: 34.1 % — ABNORMAL LOW (ref 39.0–52.0)
Hemoglobin: 12.1 g/dL — ABNORMAL LOW (ref 13.0–17.0)
Hemoglobin: 11.2 g/dL — ABNORMAL LOW (ref 13.0–17.0)
MCH: 31.3 pg (ref 26.0–34.0)
MCH: 31.5 pg (ref 26.0–34.0)
MCHC: 33.3 g/dL (ref 30.0–36.0)
MCHC: 32.8 g/dL (ref 30.0–36.0)
MCV: 93.8 fL (ref 78.0–100.0)
MCV: 96.1 fL (ref 78.0–100.0)
PLATELETS: 132 10*3/uL — AB (ref 150–400)
Platelets: 114 10*3/uL — ABNORMAL LOW (ref 150–400)
RBC: 3.87 MIL/uL — ABNORMAL LOW (ref 4.22–5.81)
RBC: 3.55 MIL/uL — ABNORMAL LOW (ref 4.22–5.81)
RDW: 12.8 % (ref 11.5–15.5)
RDW: 13 % (ref 11.5–15.5)
WBC: 13.9 10*3/uL — ABNORMAL HIGH (ref 4.0–10.5)
WBC: 14.2 10*3/uL — ABNORMAL HIGH (ref 4.0–10.5)

## 2018-07-21 LAB — CREATININE, SERUM
Creatinine, Ser: 0.99 mg/dL (ref 0.61–1.24)
Creatinine, Ser: 1.11 mg/dL (ref 0.61–1.24)
GFR calc Af Amer: >60 mL/min (ref >60)
GFR calc Af Amer: >60 mL/min (ref >60)
GFR calc non Af Amer: >60 mL/min (ref >60)
GFR calc non Af Amer: >60 mL/min (ref >60)

## 2018-07-21 LAB — MAGNESIUM
Magnesium: 2.2 mg/dL (ref 1.7–2.4)
Magnesium: 1.9 mg/dL (ref 1.7–2.4)
Magnesium: 1.9 mg/dL (ref 1.7–2.4)

## 2018-07-21 LAB — TROPONIN I
Troponin I: 1.52 ng/mL (ref <0.03)
Troponin I: 1.13 ng/mL (ref <0.03)
Troponin I: 0.92 ng/mL (ref <0.03)

## 2018-07-21 MED ORDER — METOCLOPRAMIDE HCL 5 MG/ML IJ SOLN
10.0000 mg | Freq: Four times a day (QID) | INTRAMUSCULAR | Status: AC
  Administered 2018-07-21 – 2018-07-22 (×3): 10 mg via INTRAVENOUS
  Filled 2018-07-21 (×3): qty 2

## 2018-07-21 MED ORDER — INSULIN ASPART 100 UNIT/ML ~~LOC~~ SOLN
0.0000 [IU] | SUBCUTANEOUS | Status: DC
  Administered 2018-07-22 (×2): 2 [IU] via SUBCUTANEOUS

## 2018-07-21 MED ORDER — ENOXAPARIN SODIUM 30 MG/0.3ML ~~LOC~~ SOLN
30.0000 mg | Freq: Every day | SUBCUTANEOUS | Status: DC
  Administered 2018-07-21 – 2018-07-26 (×6): 30 mg via SUBCUTANEOUS
  Filled 2018-07-21 (×6): qty 0.3

## 2018-07-21 NOTE — Plan of Care (Signed)
CRITICAL VALUE ALERT  Critical Value: troponin 1.52  Date & Time Notied:  07/21/2018 0913  Provider Notified: Servando Snare      Orders Received/Actions taken: none

## 2018-07-21 NOTE — Progress Notes (Signed)
CT surgery p.m. Rounds  Patient had stable postop day 1 following replacement of ascending aortic aneurysm Ambulated in hallway Maintaining sinus rhythm P.m. labs satisfactory Continue current care

## 2018-07-21 NOTE — Progress Notes (Signed)
Two patient belonging bags were given to Tamela Oddi, Network engineer on [place] by Barth Kirks, Therapist, sports.   Dr. Servando Snare had notified Krishay Faro that patient did have belonging bags.  Belonging bags were in PACU.

## 2018-07-21 NOTE — Progress Notes (Signed)
CRITICAL VALUE ALERT  Critical Value:  Troponin 1.52  Date & Time Notied:  07/21/2018 9:09 AM  Provider Notified: Servando Snare  Orders Received/Actions taken: no new orders given, will continue to monitor.

## 2018-07-21 NOTE — Op Note (Signed)
NAME: Lawrence [name], Lawrence [name] MEDICAL RECORD [id number] ACCOUNT [id number] DATE OF BIRTH:09-Feb-1951 FACILITY: [facility] LOCATION: [facility] PHYSICIAN:Zennie Ayars Maryruth Bun, MD  OPERATIVE REPORT  DATE OF PROCEDURE:  07/20/2018  PREOPERATIVE DIAGNOSIS:  Dilated ascending aorta and bicuspid aortic valve.  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  SURGICAL PROCEDURE:  Replacement of ascending aorta supracoronary with 32 mm Hemashield graft with hypothermic circulatory arrest and antegrade cerebral perfusion. Wheat Procedure  SURGEON:  Lanelle Bal, MD  FIRST ASSISTANT:  Nicholes Rough, PA.  BRIEF HISTORY:  The patient is a 67 year old man who initially was found to have a dilated aorta.  After having episode of chest pain and shoulder pain was referred through the emergency room with dilated ascending aorta.  On close review of his history,  he noted that his brother had died suddenly while working in the yard at age 68.  The exact cause of death was unknown.  Echocardiogram showed a bicuspid aortic valve was discussed with the patient with a 5 cm ascending aorta in the setting of a  bicuspid valve with possible family history of sudden death from aortic dissection, replacement of his ascending aorta should be considered.  Cardiac catheterization was performed that showed no evidence of coronary occlusive disease.  At that time after  cardiac catheterization.  It was the patient's impression that "everything was okay" and he did not return to the surgical appointment.  He had followup CT scan done in 03/2018, which confirmed again is dilated ascending aorta at 5 cm.  He then  agreeable with proceeding with surgery.  We discussed possible replacement of his aortic valve.  Depending on TEE in appearance of surgery.  Risks and options of surgical intervention were discussed with him in detail and he was agreeable and signed  informed consent.  DESCRIPTION OF PROCEDURE:  The  patient underwent general endotracheal anesthesia without incident.  Dr. Ermalene Postin placed a TEE probe and careful examination showed trace aortic insufficiency.  No evidence of aortic stenosis, mitral valve was intact.  The  chest was prepped with Betadine, draped in the usual sterile manner.  Appropriate timeout was performed.  We then proceeded with a median sternotomy and exposure of the aorta.  With the size of his ascending aorta, especially the midportion at 5 cm, we  then decided to use axillary cannulation.  An incision was made in the infraclavicular area.  The pectoralis major muscle fibers were separated.  The pectoralis minor was divided.  The axillary artery was identified and encircled with elastic tapes.   Care was taken not to injure the brachial plexus.  The patient was then systemically heparinized.  Arterial clamps were placed proximally and distally on the axillary artery.  The artery was opened longitudinally.  An 8 mm Hemashield graft was then sewed  to the artery with a running 6-0 Prolene.  The graft was deaired and attached to the arterial perfusion line.  We then turned our attention to the pericardium.  Retrograde cardioplegic catheter was placed.  Dual stage venous cannula was placed.  The  patient was then on cardiopulmonary bypass 2.4 liters per minute per meter squared.  His body temperature was cooled to 25 degrees.  As we were cooling, aortic crossclamp was applied 600 mL of cold blood potassium cardioplegia was administered antegrade.   Additional retrograde cardioplegia was given.  The midportion of the aorta was then transected giving good exposure of the aortic valve.  Dissection was carried down, dividing the aorta at  the sinotubular ridge  at this level.  It measured 30 mm.  The  aortic valve was bicuspid with the opening oriented anterior posterior with a single _____ to the patient's left side between the takeoff of the left main and right coronary artery.  At this point,  we decided to leave the valve intact as it was  functional and any prosthetic valve we would likely use would have less durability.  The patient had not wish to take Coumadin.  We then sewed a 32 mm Hemashield graft with the felt strips and a running 4-0 Prolene to the sinotubular ridge.  There was no  impingement on the left or right coronary ostium.  They were both easily visualized.  We then proceeded with putting the patient in head down position.  This monitoring had returned to zero.  Cerebral oximetry was in place.  We then commenced short  periods of circulatory arrest.  The patient a clamp was placed on the innominate artery and antegrade cerebral perfusion was begun.  With this, there was significant flow from the ostomy of the left carotid artery.  We then trimmed the graft to  appropriate length and anastomosed it to just proximal to the takeoff of the innominate artery with a running 4-0 Prolene over felt strips prior to complete closure of the anastomosis.  We slowly resumed full cardiopulmonary bypass  De-airing through the  proximal anastomoses.  The anastomosis was completed and we returned to full cardiopulmonary bypass with a circulatory arrest time of 24 minutes.  Antegrade cerebral perfusion of 23 minutes.  An aortic root vent was placed in the graft and the ascending  aorta to further assist in de-airing the carbon dioxide which had been infused in the pericardial sac throughout the procedure.  We used intermittent retrograde cold cardioplegia.  Topical cold body temperature was rewarmed to 37 degrees.  The patient  required electrical defibrillation to convert to a sinus rhythm in the 50s.  Atrial and ventricular pacing wires were applied.  A retrograde cardioplegic catheter was removed.  TEE showed good function of the aortic valve and the right superior pulmonary  vein vent was removed.  With the patient's rewarmed to 37 degrees, atrially pacing at 80, he was ventilated and weaned  from cardiopulmonary bypass without difficulty.  Anastomotic sites were intact.  The venous cannula was removed.  The patient remained  hemodynamically stable.  Protamine sulfate was administered.  The arterial graft at the right axillary artery was divided with a vascular stapler with operative field hemostatic, 2 Blake mediastinal drains were left in place.  The pericardium was  loosely reapproximated.  The sternum was closed with #6 stainless steel wire.  Fascia closed with interrupted 0 Vicryl, running 3-0 Vicryl, subcutaneous tissue, 3-0 subcuticular stitch in skin edges.  The pectoralis major fibers were reapproximated.   Subcutaneous tissue closed with a running 2-0 Vicryl and 3-0 subcuticular stitch in the right infraclavicular area.  Dry dressings were applied.    The patient tolerated the procedure without obvious complication.  Sponge and needle count was reported as correct at the completion of the procedure.    He was transferred to the Surgical Intensive Care Unit for further postoperative care.    Total crossclamp time was 37 minutes.  Total pump time 135 minutes.  Circulatory arrest time 24 minutes with 23 minutes of antegrade cerebral perfusion.  The patient did not require any blood bank blood products during the operative procedure.  AN/NUANCE  D:07/21/2018 T:07/21/2018  JOB:[id number]

## 2018-07-21 NOTE — Progress Notes (Signed)
EKG CRITICAL VALUE     12 lead EKG performed.  Critical value noted.  Denny Peon, RN notified.   Marilynne Halsted, CCT 07/21/2018 7:19 AM

## 2018-07-21 NOTE — Plan of Care (Signed)
  Problem: Education: Goal: Knowledge of disease or condition will improve Outcome: Progressing   Problem: Activity: Goal: Risk for activity intolerance will decrease Outcome: Progressing   Problem: Cardiac: Goal: Will achieve and/or maintain hemodynamic stability Outcome: Progressing   Problem: Clinical Measurements: Goal: Postoperative complications will be avoided or minimized Outcome: Progressing   Problem: Respiratory: Goal: Respiratory status will improve Outcome: Progressing   Problem: Skin Integrity: Goal: Risk for impaired skin integrity will decrease Outcome: Progressing   Problem: Urinary Elimination: Goal: Ability to achieve and maintain adequate renal perfusion and functioning will improve Outcome: Progressing   

## 2018-07-21 NOTE — Progress Notes (Signed)
Patient ID: Lawrence [name], male   DOB: 1951-07-30, 67 y.o.   MRN: [id number] TCTS DAILY ICU PROGRESS NOTE                   [address]            [place] [address]          [phone]   1 Day Post-Op Procedure(s) (LRB): REPLACMENT OF ASCENDING AORTA WITH 32MM GRAFT. HYPOTHERMIC CIRCULATORY ARREST. WHEAT PROCEDURE. (N/A) TRANSESOPHAGEAL ECHOCARDIOGRAM (TEE) (N/A)  Total Length of Stay:  LOS: 1 day   Subjective: Patient awake neurologically intact extubated last night.  Notes good pain control currently  Objective: Vital signs in last 24 hours: Temp:  [94.8 F (34.9 C)-98.6 F (37 C)] 98.6 F (37 C) (09/10 0700) Pulse Rate:  [29-90] 81 (09/10 0700) Cardiac Rhythm: Atrial paced (09/09 2000) Resp:  [0-25] 20 (09/10 0700) BP: (70-122)/(49-83) 91/70 (09/10 0700) SpO2:  [96 %-100 %] 97 % (09/10 0700) Arterial Line BP: (75-128)/(47-84) 111/54 (09/10 0700) FiO2 (%):  [40 %-50 %] 40 % (09/09 2240) Weight:  [97.7 kg] 97.7 kg (09/10 0500)  Filed Weights   07/20/18 0550 07/21/18 0500  Weight: 95.4 kg 97.7 kg    Weight change: 2.309 kg   Hemodynamic parameters for last 24 hours: PAP: (17-36)/(7-25) 23/10 CO:  [2.8 L/min-5.7 L/min] 5.4 L/min CI:  [1.3 L/min/m2-2.7 L/min/m2] 2.6 L/min/m2  Intake/Output from previous day: 09/09 0701 - 09/10 0700 In: 8735.9 [I.V.:4737.7; Blood:500; IV Piggyback:3498.2] Out: 6834 [Urine:1810; Emesis/NG output:30; Blood:1000; Chest Tube:330]  Intake/Output this shift: No intake/output data recorded.  Current Meds: Scheduled Meds: . acetaminophen  1,000 mg Oral Q6H   Or  . acetaminophen (TYLENOL) oral liquid 160 mg/5 mL  1,000 mg Per Tube Q6H  . aspirin EC  325 mg Oral Daily   Or  . aspirin  324 mg Per Tube Daily  . bisacodyl  10 mg Oral Daily   Or  . bisacodyl  10 mg Rectal Daily  . Chlorhexidine Gluconate Cloth  6 each Topical Daily  . docusate sodium  200 mg Oral Daily  . insulin aspart  0-24 Units Subcutaneous Q4H    . mouth rinse  15 mL Mouth Rinse BID  . metoprolol tartrate  12.5 mg Oral BID   Or  . metoprolol tartrate  12.5 mg Per Tube BID  . mupirocin ointment  1 application Nasal BID  . [START ON 07/22/2018] pantoprazole  40 mg Oral Daily  . sodium chloride flush  3 mL Intravenous Q12H   Continuous Infusions: . sodium chloride 10 mL/hr at 07/21/18 0700  . sodium chloride 250 mL (07/20/18 1448)  . sodium chloride 10 mL/hr at 07/20/18 2017  . albumin human 12.5 g (07/20/18 1612)  . cefUROXime (ZINACEF)  IV Stopped (07/21/18 0454)  . dexmedetomidine (PRECEDEX) IV infusion Stopped (07/21/18 0309)  . famotidine (PEPCID) IV 20 mg (07/20/18 1430)  . lactated ringers 20 mL/hr at 07/21/18 0700  . lactated ringers 20 mL/hr at 07/20/18 2115  . nitroGLYCERIN Stopped (07/20/18 1855)  . phenylephrine (NEO-SYNEPHRINE) Adult infusion 30 mcg/min (07/21/18 0700)   PRN Meds:.sodium chloride, albumin human, fentaNYL (SUBLIMAZE) injection, metoprolol tartrate, midazolam, ondansetron (ZOFRAN) IV, oxyCODONE, sodium chloride flush, traMADol  General appearance: alert, cooperative and no distress Neurologic: intact and No evidence of right brachial stretch or injury on exam Heart: regular rate and rhythm, S1, S2 normal, no murmur, click, rub or gallop Lungs: clear to auscultation bilaterally Abdomen: soft, non-tender;  bowel sounds normal; no masses,  no organomegaly Extremities: extremities normal, atraumatic, no cyanosis or edema and Homans sign is negative, no sign of DVT Wound: Dressings intact sternum stable  Lab Results: CBC: Recent Labs    07/20/18 2020 07/20/18 2026 07/21/18 0354  WBC 11.6*  --  13.9*  HGB 12.7* 12.2* 12.1*  HCT 38.2* 36.0* 36.3*  PLT 119*  --  132*   BMET:  Recent Labs    07/20/18 2026 07/21/18 0354  NA 139 138  K 5.3* 4.4  CL 108 111  CO2  --  21*  GLUCOSE 174* 142*  BUN 24* 23  CREATININE 1.00 0.97  CALCIUM  --  7.9*    CMET: Lab Results  Component Value Date    WBC 13.9 (H) 07/21/2018   HGB 12.1 (L) 07/21/2018   HCT 36.3 (L) 07/21/2018   PLT 132 (L) 07/21/2018   GLUCOSE 142 (H) 07/21/2018   CHOL 193 09/02/2017   TRIG 133 09/02/2017   HDL 37 (L) 09/02/2017   LDLCALC 129 (H) 09/02/2017   ALT 32 07/16/2018   AST 24 07/16/2018   NA 138 07/21/2018   K 4.4 07/21/2018   CL 111 07/21/2018   CREATININE 0.97 07/21/2018   BUN 23 07/21/2018   CO2 21 (L) 07/21/2018   TSH 1.304 09/02/2017   INR 1.71 07/20/2018   HGBA1C 6.0 (H) 07/16/2018      PT/INR:  Recent Labs    07/20/18 1428  LABPROT 19.9*  INR 1.71   Radiology: Dg Chest Port 1 View  Result Date: 07/20/2018 CLINICAL DATA:  Status post ascending aortic aneurysm repair with graft placement graft placement. EXAM: PORTABLE CHEST 1 VIEW COMPARISON:  PA and lateral chest x-ray of July 16, 2018 FINDINGS: The lungs are borderline hypoinflated. There is infiltrate or atelectasis at the left lung base with small effusion. The heart is normal in size. The mediastinum is mildly widened. The endotracheal tube tip projects 7.9 cm above the carina. The esophagogastric tube tip has its proximal port in the mid esophagus with the tip at the level of the distal esophagus. The Swan-Ganz catheter tip projects in the proximal main pulmonary outflow tract. IMPRESSION: Status post ascending aortic graft placement for aneurysm. No overt CHF. Left lower lobe atelectasis or pneumonia with small left pleural effusion. The support tubes are in reasonable position with exception of the esophagogastric tube which should be advanced by approximately 20 cm. Electronically Signed   By: Lawrence  [name] M.D.   On: 07/20/2018 15:04   ekg -diffuse ST elevation on EKG, likely pericarditis will check troponins  Assessment/Plan: S/P Procedure(s) (LRB): REPLACMENT OF ASCENDING AORTA WITH 32MM GRAFT. HYPOTHERMIC CIRCULATORY ARREST. WHEAT PROCEDURE. (N/A) TRANSESOPHAGEAL ECHOCARDIOGRAM (TEE) (N/A) Mobilize Diuresis d/c  tubes/lines Continue foley due to diuresing patient, strict I&O and urinary output monitoring See progression orders Expected Acute  Blood - loss Anemia- continue to monitor  Renal function stable    Lawrence [name] 07/21/2018 7:30 AM

## 2018-07-22 ENCOUNTER — Inpatient Hospital Stay (HOSPITAL_COMMUNITY): Payer: Medicare HMO

## 2018-07-22 LAB — GLUCOSE, CAPILLARY
Glucose-Capillary: 148 mg/dL — ABNORMAL HIGH (ref 70–99)
Glucose-Capillary: 144 mg/dL — ABNORMAL HIGH (ref 70–99)
Glucose-Capillary: 134 mg/dL — ABNORMAL HIGH (ref 70–99)
Glucose-Capillary: 120 mg/dL — ABNORMAL HIGH (ref 70–99)
Glucose-Capillary: 154 mg/dL — ABNORMAL HIGH (ref 70–99)

## 2018-07-22 LAB — BASIC METABOLIC PANEL
Anion gap: 4 — ABNORMAL LOW (ref 5–15)
BUN: 24 mg/dL — ABNORMAL HIGH (ref 8–23)
CO2: 26 mmol/L (ref 22–32)
Calcium: 7.9 mg/dL — ABNORMAL LOW (ref 8.9–10.3)
Chloride: 107 mmol/L (ref 98–111)
Creatinine, Ser: 1 mg/dL (ref 0.61–1.24)
GFR calc Af Amer: >60 mL/min (ref >60)
GFR calc non Af Amer: >60 mL/min (ref >60)
Glucose, Bld: 149 mg/dL — ABNORMAL HIGH (ref 70–99)
Potassium: 4.2 mmol/L (ref 3.5–5.1)
Sodium: 137 mmol/L (ref 135–145)

## 2018-07-22 LAB — CBC
HCT: 31.5 % — ABNORMAL LOW (ref 39.0–52.0)
Hemoglobin: 10.2 g/dL — ABNORMAL LOW (ref 13.0–17.0)
MCH: 31.5 pg (ref 26.0–34.0)
MCHC: 32.4 g/dL (ref 30.0–36.0)
MCV: 97.2 fL (ref 78.0–100.0)
Platelets: 106 10*3/uL — ABNORMAL LOW (ref 150–400)
RBC: 3.24 MIL/uL — ABNORMAL LOW (ref 4.22–5.81)
RDW: 13.2 % (ref 11.5–15.5)
WBC: 12.2 10*3/uL — ABNORMAL HIGH (ref 4.0–10.5)

## 2018-07-22 MED ORDER — SODIUM CHLORIDE 0.9 % IV SOLN: 250.0000 mL | INTRAVENOUS | Status: DC | PRN

## 2018-07-22 MED ORDER — MOVING RIGHT ALONG BOOK
Freq: Once | Status: AC
  Administered 2018-07-22: 14:00:00
  Filled 2018-07-22: qty 1

## 2018-07-22 MED ORDER — SODIUM CHLORIDE 0.9% FLUSH: 3.0000 mL | INTRAVENOUS | Status: DC | PRN

## 2018-07-22 MED ORDER — SODIUM CHLORIDE 0.9% FLUSH
3.0000 mL | Freq: Two times a day (BID) | INTRAVENOUS | Status: DC
  Administered 2018-07-22 – 2018-07-26 (×6): 3 mL via INTRAVENOUS

## 2018-07-22 NOTE — Progress Notes (Signed)
07/22/2018 1600 Received pt to room 4E-19 from [facility].  Pt is A&O, no C/O voiced.  Tele monitor applied and CCMD notified.  CHG given.  Oriented to room, call light and bed.  Call bell in reach. [place]

## 2018-07-22 NOTE — Progress Notes (Signed)
.  EKG CRITICAL VALUE     12 lead EKG performed.  Critical value noted. Selinda Flavin, RN notified.   Ihor Gully, CCT 07/22/2018 7:11 AM

## 2018-07-22 NOTE — Care Management Note (Addendum)
Case Management Note  Patient Details  Name: Lawrence [name] MRN: [id number] Date of Birth: Mar 24, 1951  Subjective/Objective:  S/p replacement of ascending aortic aneurysm                  Action/Plan: CM consult acknowledged-request to eval patient d/t living at home alone. Patient lives at home alone, was independent with ADLs PTA, with occasional use of a cane to assist with ambulation d/t hx of back pain. PCP verified as: Dr. Allyn Kenner; Patient's pharmacy of choice is [facility], [place]. Patient reported having assistance post transition during the day, but no one available to provide assistance at night. CM discussed Short Term SNF for Rehab d/t post surgical deconditioning and no available 24hr support, with patient agreeable, and requesting a facility in [place] near [place]. CM informed patient authorization from [facility] would be required for Short Term SNF, with PT/OT notes needed, with patient verbalizing understanding. SW consult needed to initiate SNF placement process. CM will continue to follow.   Expected Discharge Date:                  Expected Discharge Plan:     In-House Referral:  NA  Discharge planning Services  CM Consult, Other - See comment(Lives alone without assistance)  Post Acute Care Choice:  NA Choice offered to:  NA  DME Arranged:  N/A DME Agency:  NA  HH Arranged:  NA HH Agency:  NA  Status of Service:  In process, will continue to follow  If discussed at Long Length of Stay Meetings, dates discussed:    Additional Comments:  Midge Minium RN, BSN, NCM-BC, ACM-RN [phone] 07/22/2018, 11:14 AM

## 2018-07-22 NOTE — Progress Notes (Signed)
CRITICAL VALUE ALERT  Critical Value:  EKG result 07/22/18  Date & Time Notied:  0700 on 07/22/18 during morning report   Provider Notified: PA Harriet Pho & MD Servando Snare.   Orders Received/Actions taken: no new orders.

## 2018-07-22 NOTE — Progress Notes (Addendum)
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 HYPOTHERMIC CIRCULATORY ARREST. WHEAT PROCEDURE. (N/A) TRANSESOPHAGEAL ECHOCARDIOGRAM (TEE) (N/A) Subjective: Feels good overall this morning. He didn't sleep well. He did have some nausea yesterday but none this morning.   Objective: Vital signs in last 24 hours: Temp:  [97.7 F (36.5 C)-99 F (37.2 C)] 98.4 F (36.9 C) (09/11 0729) Pulse Rate:  [69-109] 80 (09/11 0700) Cardiac Rhythm: Normal sinus rhythm (09/10 2000) Resp:  [12-28] 21 (09/11 0700) BP: (88-118)/(62-83) 106/71 (09/11 0729) SpO2:  [87 %-98 %] 91 % (09/11 0700) Arterial Line BP: (95-146)/(46-72) 117/54 (09/10 1215) Weight:  [98.8 kg] 98.8 kg (09/11 0600)  Hemodynamic parameters for last 24 hours: PAP: (20-40)/(9-22) 40/21  Intake/Output from previous day: 09/10 0701 - 09/11 0700 In: 1701.1 [P.O.:1440; I.V.:161.2; IV Piggyback:99.9] Out: 945 [Urine:893; Chest Tube:52] Intake/Output this shift: No intake/output data recorded.  General appearance: alert, cooperative and no distress Heart: regular rate and rhythm, S1, S2 normal, no murmur, click, rub or gallop Lungs: clear to auscultation bilaterally and diminished in bilateral lower lobes Abdomen: soft, non-tender; bowel sounds normal; no masses,  no organomegaly Extremities: bilateral hands with nonpitting edema Wound: clean and dry covered with a sterile dressing  Lab Results: Recent Labs    07/21/18 1627 07/22/18 0426  WBC 14.2* 12.2*  HGB 11.2* 10.2*  HCT 34.1* 31.5*  PLT 114* 106*   BMET:  Recent Labs    07/21/18 0354  07/21/18 1555 07/21/18 1627 07/22/18 0426  NA 138  --  144  --  137  K 4.4  --  4.0  --  4.2  CL 111  --  101  --  107  CO2 21*  --   --   --  26  GLUCOSE 142*  --  200*  --  149*  BUN 23  --  32*  --  24*  CREATININE 0.97   < > 1.00 1.11 1.00    CALCIUM 7.9*  --   --   --  7.9*   < > = values in this interval not displayed.    PT/INR:  Recent Labs    07/20/18 1428  LABPROT 19.9*  INR 1.71   ABG    Component Value Date/Time   PHART 7.339 (L) 07/21/2018 0000   HCO3 21.3 07/21/2018 0000   TCO2 23 07/21/2018 1555   ACIDBASEDEF 4.0 (H) 07/21/2018 0000   O2SAT 97.0 07/21/2018 0000   CBG (last 3)  Recent Labs    07/21/18 1933 07/21/18 2325 07/22/18 0359  GLUCAP 162* 138* 148*    Assessment/Plan: S/P Procedure(s) (LRB): REPLACMENT OF ASCENDING AORTA WITH 32MM GRAFT. HYPOTHERMIC CIRCULATORY ARREST. WHEAT PROCEDURE. (N/A) TRANSESOPHAGEAL ECHOCARDIOGRAM (TEE) (N/A)  1. CV-NSR in the 90s, BP well controlled. Currently on no drips. Continue ASA and Lopressor. 2. Pulm-tolerating 1L [place] with good oxygen saturation. CXR thius morning shows expected post-op atelectasis with possible small bilateral pleural effusions. Will await official read.  3. Renal-creatinine 1.00, electrolytes okay. Expected volume overload. Can likely start a diuretic regimen since he is now off pressors.  4. H and H is remaining stable, expected acute blood loss anemia. 10.2/31.5 5. Endo-blood glucose with moderate control. Continue SSI. A1C is 6.0 6. Continue Lovenox for DVT prophylaxis  Plan: Can probably  be transferred to the stepdown unit today. His central line can be discontinued on transfer if two peripheral lines are placed. Continued to encourage incentive spirometer hourly. Pain is well controlled. Encouraged ambulation in the halls. Weaning oxygen as tolerated.     LOS: 2 days    Lawrence [name] 07/22/2018   Pt/ot consult for poss rehab post d/c  Progressing well I have seen and examined [name] and agree with the above assessment  and plan.  [name] MD Beeper [phone] Office [phone] 07/22/2018 1:42 PM

## 2018-07-23 LAB — GLUCOSE, CAPILLARY
Glucose-Capillary: 108 mg/dL — ABNORMAL HIGH (ref 70–99)
Glucose-Capillary: 134 mg/dL — ABNORMAL HIGH (ref 70–99)
Glucose-Capillary: 98 mg/dL (ref 70–99)
Glucose-Capillary: 99 mg/dL (ref 70–99)

## 2018-07-23 LAB — BASIC METABOLIC PANEL
ANION GAP: 5 (ref 5–15)
BUN: 22 mg/dL (ref 8–23)
CHLORIDE: 106 mmol/L (ref 98–111)
CO2: 28 mmol/L (ref 22–32)
Calcium: 8.1 mg/dL — ABNORMAL LOW (ref 8.9–10.3)
Creatinine, Ser: 0.94 mg/dL (ref 0.61–1.24)
GFR calc non Af Amer: >60 mL/min (ref >60)
GLUCOSE: 114 mg/dL — AB (ref 70–99)
POTASSIUM: 4.1 mmol/L (ref 3.5–5.1)
Sodium: 139 mmol/L (ref 135–145)

## 2018-07-23 LAB — CBC
HEMATOCRIT: 29.1 % — AB (ref 39.0–52.0)
HEMOGLOBIN: 9.4 g/dL — AB (ref 13.0–17.0)
MCH: 30.9 pg (ref 26.0–34.0)
MCHC: 32.3 g/dL (ref 30.0–36.0)
MCV: 95.7 fL (ref 78.0–100.0)
Platelets: 116 10*3/uL — ABNORMAL LOW (ref 150–400)
RBC: 3.04 MIL/uL — ABNORMAL LOW (ref 4.22–5.81)
RDW: 13.5 % (ref 11.5–15.5)
WBC: 9 10*3/uL (ref 4.0–10.5)

## 2018-07-23 MED ORDER — INFLUENZA VAC SPLIT HIGH-DOSE 0.5 ML IM SUSY
0.5000 mL | PREFILLED_SYRINGE | INTRAMUSCULAR | Status: DC
  Filled 2018-07-23 (×2): qty 0.5

## 2018-07-23 MED ORDER — FUROSEMIDE 20 MG PO TABS
20.0000 mg | ORAL_TABLET | Freq: Once | ORAL | Status: AC
  Administered 2018-07-23: 20 mg via ORAL
  Filled 2018-07-23: qty 1

## 2018-07-23 MED ORDER — INSULIN ASPART 100 UNIT/ML ~~LOC~~ SOLN
0.0000 [IU] | Freq: Three times a day (TID) | SUBCUTANEOUS | Status: DC
  Administered 2018-07-23: 2 [IU] via SUBCUTANEOUS

## 2018-07-23 MED FILL — Heparin Sodium (Porcine) Inj 1000 Unit/ML: INTRAMUSCULAR | Qty: 2500 | Status: AC

## 2018-07-23 MED FILL — Heparin Sodium (Porcine) Inj 1000 Unit/ML: INTRAMUSCULAR | Qty: 10 | Status: AC

## 2018-07-23 MED FILL — Sodium Chloride IV Soln 0.9%: INTRAVENOUS | Qty: 3000 | Status: AC

## 2018-07-23 MED FILL — Potassium Chloride Inj 2 mEq/ML: INTRAVENOUS | Qty: 40 | Status: AC

## 2018-07-23 MED FILL — Mannitol IV Soln 20%: INTRAVENOUS | Qty: 500 | Status: AC

## 2018-07-23 MED FILL — Lidocaine HCl(Cardiac) IV PF Soln Pref Syr 100 MG/5ML (2%): INTRAVENOUS | Qty: 5 | Status: AC

## 2018-07-23 MED FILL — Heparin Sodium (Porcine) Inj 1000 Unit/ML: INTRAMUSCULAR | Qty: 30 | Status: AC

## 2018-07-23 MED FILL — Magnesium Sulfate Inj 50%: INTRAMUSCULAR | Qty: 10 | Status: AC

## 2018-07-23 MED FILL — Albumin, Human Inj 5%: INTRAVENOUS | Qty: 500 | Status: AC

## 2018-07-23 MED FILL — Sodium Bicarbonate IV Soln 8.4%: INTRAVENOUS | Qty: 50 | Status: AC

## 2018-07-23 MED FILL — Electrolyte-R (PH 7.4) Solution: INTRAVENOUS | Qty: 5000 | Status: AC

## 2018-07-23 NOTE — Progress Notes (Addendum)
[address]       [name],Lawrence [name]             [phone]      3 Days Post-Op Procedure(s) (LRB): REPLACMENT OF ASCENDING AORTA WITH 32MM GRAFT. HYPOTHERMIC CIRCULATORY ARREST. WHEAT PROCEDURE. (N/A) TRANSESOPHAGEAL ECHOCARDIOGRAM (TEE) (N/A) Subjective: Feels fine, minor pain, not SOB  Objective: Vital signs in last 24 hours: Temp:  [97.6 F (36.4 C)-98.4 F (36.9 C)] 98.3 F (36.8 C) (09/12 0400) Pulse Rate:  [71-93] 77 (09/12 0400) Cardiac Rhythm: Normal sinus rhythm;Bundle branch block (09/11 1900) Resp:  [16-26] 16 (09/12 0627) BP: (102-133)/(63-93) 105/77 (09/12 0400) SpO2:  [89 %-95 %] 94 % (09/12 0400) Weight:  [98.7 kg] 98.7 kg (09/12 0627)  Hemodynamic parameters for last 24 hours:    Intake/Output from previous day: 09/11 0701 - 09/12 0700 In: 240 [P.O.:200; I.V.:40] Out: 300 [Urine:300] Intake/Output this shift: No intake/output data recorded.  General appearance: alert, cooperative and no distress Heart: regular rate and rhythm and no murmur Lungs: dim in bases Abdomen: mild distension, + BS, non tender Extremities: minor edema Wound: dressings CDI  Lab Results: Recent Labs    07/22/18 0426 07/23/18 0413  WBC 12.2* 9.0  HGB 10.2* 9.4*  HCT 31.5* 29.1*  PLT 106* 116*   BMET:  Recent Labs    07/22/18 0426 07/23/18 0413  NA 137 139  K 4.2 4.1  CL 107 106  CO2 26 28  GLUCOSE 149* 114*  BUN 24* 22  CREATININE 1.00 0.94  CALCIUM 7.9* 8.1*    PT/INR:  Recent Labs    07/20/18 1428  LABPROT 19.9*  INR 1.71   ABG    Component Value Date/Time   PHART 7.339 (L) 07/21/2018 0000   HCO3 21.3 07/21/2018 0000   TCO2 23 07/21/2018 1555   ACIDBASEDEF 4.0 (H) 07/21/2018 0000   O2SAT 97.0 07/21/2018 0000   CBG (last 3)  Recent Labs    07/22/18 2028 07/23/18 0004 07/23/18 0356  GLUCAP 154* 99 108*    Meds Scheduled Meds: . acetaminophen  1,000 mg Oral Q6H   Or  . acetaminophen (TYLENOL) oral liquid 160  mg/5 mL  1,000 mg Per Tube Q6H  . aspirin EC  325 mg Oral Daily   Or  . aspirin  324 mg Per Tube Daily  . bisacodyl  10 mg Oral Daily   Or  . bisacodyl  10 mg Rectal Daily  . Chlorhexidine Gluconate Cloth  6 each Topical Daily  . docusate sodium  200 mg Oral Daily  . enoxaparin (LOVENOX) injection  30 mg Subcutaneous QHS  . insulin aspart  0-24 Units Subcutaneous Q4H  . metoprolol tartrate  12.5 mg Oral BID  . mupirocin ointment  1 application Nasal BID  . pantoprazole  40 mg Oral Daily  . sodium chloride flush  3 mL Intravenous Q12H  . sodium chloride flush  3 mL Intravenous Q12H   Continuous Infusions: . sodium chloride    . lactated ringers Stopped (07/22/18 0900)  . lactated ringers 20 mL/hr at 07/20/18 2115   PRN Meds:.sodium chloride, metoprolol tartrate, ondansetron (ZOFRAN) IV, oxyCODONE, sodium chloride flush, sodium chloride flush, traMADol  Xrays Dg Chest Port 1 View  Result Date: 07/22/2018 CLINICAL DATA:  Chest pain. Recent ascending thoracic aortic graft placement EXAM: PORTABLE CHEST 1 VIEW COMPARISON:  July 21, 2018 FINDINGS: Swan-Ganz catheter is been removed. Cordis tip is in the superior vena cava. No pneumothorax. There is  atelectatic change in the left base. A lesser degree of atelectasis is noted in the right base. Lungs elsewhere clear. Heart is upper normal in size with pulmonary vascularity normal. Aorta is prominent but stable. No evident adenopathy. No bone lesions. IMPRESSION: Cordis tip in superior vena cava. No pneumothorax. Atelectasis in the bases, more on the left than on the right. Lungs elsewhere clear. Stable cardiac silhouette. Electronically Signed   By: Lowella Grip III M.D.   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 HYPOTHERMIC CIRCULATORY ARREST. WHEAT PROCEDURE. (N/A) TRANSESOPHAGEAL ECHOCARDIOGRAM (TEE) (N/A)  1 doing well 2 hemodyn stable in sinus rhythm 3  BS fairly  well controlled, HGB A1C 6, on no meds, diet control 4 routine rehab and pulm toilet for atx 5 mild volume overload- will give low dose lasix this am 6 H/H fairly stable ABL anemia 7 renal fxn improved     LOS: 3 days    Lawrence [name] 07/23/2018 Pager [phone]  Stable day, wants to go to [place], no care at home I have seen and examined Lawrence [name] and agree with the above assessment  and plan.  [name] MD Beeper [phone] Office [phone] 07/23/2018 6:33 PM

## 2018-07-23 NOTE — Progress Notes (Signed)
SRCARDIAC REHAB PHASE I   PRE:  Rate/Rhythm: 75 SR    BP: sitting 113/76    SaO2: 92 RA  MODE:  Ambulation: 200 ft   POST:  Rate/Rhythm: 95 SR    BP: sitting 124/82     SaO2: 92 RA  Pt able to stand with min assist and walk with RW. Slow pace, c/o wheezing at times. SaO2 92 RA, pt tired after 200 ft. Return to recliner. Encouraged more IS (1200 mL) and more walking with RW. [phone]   Darrick Meigs CES, ACSM 07/23/2018 11:29 AM

## 2018-07-23 NOTE — NC FL2 (Signed)
[place] MEDICAID FL2 LEVEL OF CARE SCREENING TOOL     IDENTIFICATION  Patient Name: Lawrence [name] Birthdate: 1951/09/15 Sex: male Admission Date (Current Location): 07/20/2018  [facility] and [place] Number:  Herbalist and Address:  [facility] [place], [place] [address] [place], [place] [address]      Provider Number: [id number]  Attending Physician Name and Address:  [name], MD  Relative Name and Phone Number:  Eduardo Osier, [phone]    Current Level of Care: Hospital Recommended Level of Care: [place] Prior Approval Number:    Date Approved/Denied:   PASRR Number: [id number]  Discharge Plan: SNF    Current Diagnoses: Patient Active Problem List   Diagnosis Date Noted  . 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 Sebaceous cyst 03/06/2018  . 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 Essential hypertension 09/01/2017  . Aortic atherosclerosis ([place])   . Aortic insufficiency   . Aortic stenosis   . Bicuspid aortic valve   . Thoracic aortic aneurysm ([place])   . History of colonic polyps 08/01/2016    Orientation RESPIRATION BLADDER Height & Weight     Self, Time, Situation, Place  Normal Continent Weight: 217 lb 9.5 oz (98.7 kg) Height:  5\' 10"  (177.8 cm)  BEHAVIORAL SYMPTOMS/MOOD NEUROLOGICAL BOWEL NUTRITION STATUS      Continent Diet(heart healthy/carb modified)  AMBULATORY STATUS COMMUNICATION OF NEEDS Skin   Limited Assist Verbally                         Personal Care Assistance Level of Assistance  Bathing, Feeding, Dressing Bathing Assistance: Limited assistance Feeding assistance: Independent Dressing Assistance: Limited assistance     Functional Limitations Info  Sight, Hearing, Speech Sight Info: Adequate Hearing Info: Adequate Speech Info: Adequate    SPECIAL CARE FACTORS FREQUENCY  PT (By licensed PT), OT (By licensed OT)     PT Frequency: 5x wk OT Frequency: 5x wk             Contractures Contractures Info: Not present    Additional Factors Info  Code Status, Allergies Code Status Info: full code Allergies Info: CIPROFLOXACIN, MORPHINE AND RELATED            Current Medications (07/23/2018):  This is the current hospital active medication list Current Facility-Administered Medications  Medication Dose Route Frequency Provider Last Rate Last Dose  . 0.9 %  sodium chloride infusion  250 mL Intravenous PRN Conte, Tessa N, PA-C      . acetaminophen (TYLENOL) tablet 1,000 mg  1,000 mg Oral Q6H Conte, Tessa N, PA-C   1,000 mg at 07/23/18 1696   Or  . acetaminophen (TYLENOL) solution 1,000 mg  1,000 mg Per Tube Q6H Conte, Tessa N, PA-C      . aspirin EC tablet 325 mg  325 mg Oral Daily Elgie Collard, [place]   325 mg at 07/23/18 7893   Or  . aspirin chewable tablet 324 mg  324 mg Per Tube Daily Conte, Tessa N, PA-C      . bisacodyl (DULCOLAX) EC tablet 10 mg  10 mg Oral Daily Nicholes Rough N, PA-C   10 mg at 07/23/18 8101   Or  . bisacodyl (DULCOLAX) suppository 10 mg  10 mg Rectal Daily Harriet Pho, Tessa N, PA-C      . Chlorhexidine Gluconate Cloth 2 % PADS 6 each  6 each Topical Daily Elgie Collard, PA-C  6 each at 07/22/18 1504  . docusate sodium (COLACE) capsule 200 mg  200 mg Oral Daily Nicholes Rough N, PA-C   200 mg at 07/23/18 0109  . enoxaparin (LOVENOX) injection 30 mg  30 mg Subcutaneous QHS Conte, Tessa N, PA-C   30 mg at 07/22/18 2055  . [START ON 07/24/2018] Influenza vac split quadrivalent PF (FLUZONE HIGH-DOSE) injection 0.5 mL  0.5 mL Intramuscular Tomorrow-1000 [name], MD      . insulin aspart (novoLOG) injection 0-24 Units  0-24 Units Subcutaneous TID WC Gold, Wayne E, PA-C      . lactated ringers infusion   Intravenous Continuous Conte, Tessa N, PA-C   Stopped at 07/22/18 0900  . lactated ringers infusion   Intravenous Continuous Elgie Collard, PA-C 20 mL/hr at 07/20/18 2115    . metoprolol tartrate (LOPRESSOR) injection 2.5-5 mg   2.5-5 mg Intravenous Q2H PRN Harriet Pho, Tessa N, PA-C      . metoprolol tartrate (LOPRESSOR) tablet 12.5 mg  12.5 mg Oral BID Harriet Pho, Tessa N, PA-C   12.5 mg at 07/23/18 0839  . mupirocin ointment (BACTROBAN) 2 % 1 application  1 application Nasal BID Elgie Collard, [place]   1 application at 32/35/57 2055  . ondansetron (ZOFRAN) injection 4 mg  4 mg Intravenous Q6H PRN Elgie Collard, PA-C   4 mg at 07/21/18 1439  . oxyCODONE (Oxy IR/ROXICODONE) immediate release tablet 5-10 mg  5-10 mg Oral Q3H PRN Elgie Collard, PA-C   10 mg at 07/21/18 0919  . pantoprazole (PROTONIX) EC tablet 40 mg  40 mg Oral Daily Elgie Collard, PA-C   40 mg at 07/23/18 0842  . sodium chloride flush (NS) 0.9 % injection 3 mL  3 mL Intravenous Q12H Conte, Tessa N, PA-C   3 mL at 07/22/18 1042  . sodium chloride flush (NS) 0.9 % injection 3 mL  3 mL Intravenous PRN Harriet Pho, Tessa N, PA-C      . sodium chloride flush (NS) 0.9 % injection 3 mL  3 mL Intravenous Q12H Conte, Tessa N, PA-C   3 mL at 07/22/18 2233  . sodium chloride flush (NS) 0.9 % injection 3 mL  3 mL Intravenous PRN Conte, Tessa N, PA-C      . traMADol Veatrice Bourbon) tablet 50-100 mg  50-100 mg Oral Q4H PRN Conte, Tessa N, PA-C   100 mg at 07/23/18 0400     Discharge Medications: Please see discharge summary for a list of discharge medications.  Relevant Imaging Results:  Relevant Lab Results:   Additional Information SS# [id number]  Wende Neighbors, LCSW

## 2018-07-23 NOTE — Clinical Social Work Note (Signed)
Clinical Social Work Assessment  Patient Details  Name: Lawrence [name] MRN: [id number] Date of Birth: May 21, 1951  Date of referral:  07/23/18               Reason for consult:  Discharge Planning, Facility Placement                Permission sought to share information with:  Family Supports Permission granted to share information::  Yes, Verbal Permission Granted  Name::        Agency::     Relationship::     Contact Information:     Housing/Transportation Living arrangements for the past 2 months:  Single Family Home Source of Information:  Patient Patient Interpreter Needed:  None Criminal Activity/Legal Involvement Pertinent to Current Situation/Hospitalization:  No - Comment as needed Significant Relationships:  Other Family Members, Friend Lives with:  Self Do you feel safe going back to the place where you live?  Yes Need for family participation in patient care:  Yes (Comment)  Care giving concerns:  No family at bedside. Patient stated he lives by himself and most of his family are up in [place]. Patient stated he does have friends in the area for some support that can check in on him during the day.    Social Worker assessment / plan:  CSW met patient at bedside to discuss discharge plan. Patient stated he has support from friends in the area. Patient stated that he works part time as a Tourist information centre manager at [employer] and stated he loves it. Patient is agreeable to discharge to a facility but stated he would prefer to go to a facility in the [place] area. CSW will follow up with patient once bed offers are available   Employment status:  Retired Nurse, adult PT Recommendations:  [place] / Referral to community resources:  [place]  Patient/Family's Response to care:  Patient appreciative of CSW role in care  Patient/Family's Understanding of and Emotional Response to Diagnosis, Current Treatment, and  Prognosis:  Patient agreeable for patient to discharge to a facility   Emotional Assessment Appearance:  Appears stated age Attitude/Demeanor/Rapport:  Engaged Affect (typically observed):  Accepting Orientation:  Oriented to Self, Oriented to Place, Oriented to  Time Alcohol / Substance use:  Not Applicable Psych involvement (Current and /or in the community):  No (Comment)  Discharge Needs  Concerns to be addressed:  Care Coordination Readmission within the last 30 days:  No Current discharge risk:  None Barriers to Discharge:  Continued Medical Work up   [employer], LCSW 07/23/2018, 3:38 PM

## 2018-07-23 NOTE — Evaluation (Signed)
Physical Therapy Evaluation Patient Details Name: Lawrence [name] MRN: [id number] DOB: 1951-04-07 Today's Date: 07/23/2018   History of Present Illness  Pt is a 67 y.o. male admitted 07/20/18 with thoracic aortic aneurysm now s/p replacement. PMH includes HTN, arthritis.  Clinical Impression  Pt presents with an overall decrease in functional mobility secondary to above. PTA, pt mod indep with intermittent use of SPC; lives alone and does not have consistent support available. Educ on precautions, positioning, and importance of mobility. Today, pt requires minA to stand, amb short distance with RW and min guard for balance. Slowed gait speed, decreased activity tolerance, and poor balance strategies place pt at significant risk for falls. Pt would benefit from continued acute PT services to maximize functional mobility and independence prior to d/c with short-term SNF-level therapies.     Follow Up Recommendations SNF;Supervision for mobility/OOB    Equipment Recommendations  Rolling walker with 5" wheels    Recommendations for Other Services OT consult     Precautions / Restrictions Precautions Precautions: Fall;Sternal Precaution Comments: sternal/abdominal incision Restrictions Weight Bearing Restrictions: Yes(sternal)      Mobility  Bed Mobility               General bed mobility comments: Received sitting in recliner  Transfers Overall transfer level: Needs assistance Equipment used: Rolling walker (2 wheeled) Transfers: Sit to/from Stand Sit to Stand: Min assist         General transfer comment: Cues for standing with hands on knees. MinA to assist trunk elevation and maintain balance. Cues for eccentric control  Ambulation/Gait Ambulation/Gait assistance: Min guard   Assistive device: Rolling walker (2 wheeled) Gait Pattern/deviations: Step-through pattern;Decreased stride length;Trunk flexed Gait velocity: Decreased Gait velocity interpretation: <1.8  ft/sec, indicate of risk for recurrent falls General Gait Details: Slow amb with RW and min guard for balance. Required multiple standing rest breaks secondary to fatigue; DOE 2/4. SpO2 >90% on RA  Stairs            Wheelchair Mobility    Modified Rankin (Stroke Patients Only)       Balance Overall balance assessment: Needs assistance   Sitting balance-Leahy Scale: Fair       Standing balance-Leahy Scale: Poor Standing balance comment: Reliant on UE support                             Pertinent Vitals/Pain Pain Assessment: 0-10 Pain Score: 6  Pain Location: Abdominal incision Pain Descriptors / Indicators: Sore Pain Intervention(s): Monitored during session;Limited activity within patient's tolerance    Home Living Family/patient expects to be discharged to:: Private residence Living Arrangements: Alone Available Help at Discharge: Friend(s);Available 24 hours/day Type of Home: House Home Access: Stairs to enter Entrance Stairs-Rails: Right Entrance Stairs-Number of Steps: 1 Home Layout: One level Home Equipment: Crutches Additional Comments: Friends can maybe help during day, but pt alone at night    Prior Function Level of Independence: Independent         Comments: Used to use SPC in morning due to back pain, but does not know where cane went. Drives. Was working at [employer] but quit prior to [employer]        Extremity/Trunk Assessment   Upper Extremity Assessment Upper Extremity Assessment: Overall WFL for tasks assessed    Lower Extremity Assessment Lower Extremity Assessment: Generalized weakness       Communication   Communication: No  difficulties  Cognition Arousal/Alertness: Awake/alert Behavior During Therapy: WFL for tasks assessed/performed Overall Cognitive Status: Within Functional Limits for tasks assessed                                        General Comments      Exercises Other  Exercises Other Exercises: Cues for upright posture to encourage back extension and shoulder rolls   Assessment/Plan    PT Assessment Patient needs continued PT services  PT Problem List Decreased strength;Decreased activity tolerance;Decreased balance;Decreased mobility;Decreased knowledge of use of DME;Decreased knowledge of precautions;Cardiopulmonary status limiting activity       PT Treatment Interventions DME instruction;Gait training;Stair training;Functional mobility training;Therapeutic activities;Therapeutic exercise;Balance training;Patient/family education    PT Goals (Current goals can be found in the Care Plan section)  Acute Rehab PT Goals Patient Stated Goal: Get back to working eventually PT Goal Formulation: With patient Time For Goal Achievement: 08/06/18 Potential to Achieve Goals: Good    Frequency Min 2X/week   Barriers to discharge        Co-evaluation               AM-PAC PT "6 Clicks" Daily Activity  Outcome Measure Difficulty turning over in bed (including adjusting bedclothes, sheets and blankets)?: Unable Difficulty moving from lying on back to sitting on the side of the bed? : Unable Difficulty sitting down on and standing up from a chair with arms (e.g., wheelchair, bedside commode, etc,.)?: Unable Help needed moving to and from a bed to chair (including a wheelchair)?: A Little Help needed walking in hospital room?: A Little Help needed climbing 3-5 steps with a railing? : A Little 6 Click Score: 12    End of Session Equipment Utilized During Treatment: Gait belt Activity Tolerance: Patient tolerated treatment well Patient left: in chair;with call bell/phone within reach Nurse Communication: Mobility status PT Visit Diagnosis: Other abnormalities of gait and mobility (R26.89);Unsteadiness on feet (R26.81)    Time: 1610-9604 PT Time Calculation (min) (ACUTE ONLY): 24 min   Charges:   PT Evaluation $PT Eval Moderate Complexity: 1  Mod PT Treatments $Gait Training: 8-22 mins       Mabeline Caras, PT, DPT Acute Rehabilitation Services  Pager [phone] Office [phone]  Derry Lory 07/23/2018, 9:23 AM

## 2018-07-23 NOTE — Evaluation (Signed)
Occupational Therapy Evaluation Patient Details Name: Lawrence [name] MRN: [id number] DOB: [birth year] Today's Date: 07/23/2018    History of Present Illness Pt is a 66 y.o. male admitted 07/20/18 with thoracic aortic aneurysm now s/p replacement. PMH includes HTN, arthritis.   Clinical Impression   Pt was living independently prior to admission. Presents with anticipated post operative pain, generalized weakness and decreased standing balance. Pt requires set up to moderate assistance for ADL. Will follow acutely. Recommending SNF for ST rehab prior to return home alone.    Follow Up Recommendations  SNF;Supervision/Assistance - 24 hour    Equipment Recommendations  (defer to next venue)    Recommendations for Other Services       Precautions / Restrictions Precautions Precautions: Fall;Sternal Precaution Booklet Issued: Yes (comment) Precaution Comments: sternal/abdominal incision Restrictions Weight Bearing Restrictions: Yes(sternal)      Mobility Bed Mobility               General bed mobility comments: Received sitting in recliner  Transfers Overall transfer level: Needs assistance Equipment used: Rolling walker (2 wheeled) Transfers: Sit to/from Stand Sit to Stand: Min guard         General transfer comment: cues for hands on knees and to use momentum, min guard for safety    Balance Overall balance assessment: Needs assistance   Sitting balance-Leahy Scale: Good       Standing balance-Leahy Scale: Poor Standing balance comment: Reliant on UE support                           ADL either performed or assessed with clinical judgement   ADL Overall ADL's : Needs assistance/impaired Eating/Feeding: Independent;Sitting   Grooming: Wash/dry hands;Wash/dry face;Sitting;Set up   Upper Body Bathing: Set up;Sitting   Lower Body Bathing: Moderate assistance;Sit to/from stand   Upper Body Dressing : Set up;Sitting   Lower Body Dressing:  Moderate assistance;Sit to/from stand   Toilet Transfer: Min guard;Ambulation;RW;BSC   Toileting- Water quality scientist and Hygiene: Min guard;Sit to/from stand       Functional mobility during ADLs: Min guard;Rolling walker       Vision Patient Visual Report: No change from baseline       Perception     Praxis      Pertinent Vitals/Pain Pain Assessment: Faces Faces Pain Scale: Hurts little more Pain Location: Abdominal incision Pain Descriptors / Indicators: Sore Pain Intervention(s): Monitored during session;Repositioned     Hand Dominance Right   Extremity/Trunk Assessment Upper Extremity Assessment Upper Extremity Assessment: Overall WFL for tasks assessed   Lower Extremity Assessment Lower Extremity Assessment: Defer to PT evaluation       Communication Communication Communication: No difficulties   Cognition Arousal/Alertness: Awake/alert Behavior During Therapy: WFL for tasks assessed/performed Overall Cognitive Status: Within Functional Limits for tasks assessed                                     General Comments       Exercises Other Exercises Other Exercises: Cues for upright posture to encourage back extension and shoulder rolls   Shoulder Instructions      Home Living Family/patient expects to be discharged to:: Private residence Living Arrangements: Alone Available Help at Discharge: Friend(s);Available PRN/intermittently Type of Home: House Home Access: Stairs to enter [employer] of Steps: 1 Entrance Stairs-Rails: Right Home Layout: One level  Bathroom Shower/Tub: Corporate investment banker: Standard     Home Equipment: Crutches          Prior Functioning/Environment Level of Independence: Independent        Comments: Used to use SPC in morning due to back pain, but does not know where cane went. Drives. Was working at [employer] but quit prior to sx        OT Problem List:  Decreased strength;Decreased activity tolerance;Impaired balance (sitting and/or standing);Decreased knowledge of use of DME or AE;Pain      OT Treatment/Interventions: Self-care/ADL training;DME and/or AE instruction;Patient/family education;Therapeutic activities    OT Goals(Current goals can be found in the care plan section) Acute Rehab OT Goals Patient Stated Goal: Get back to working eventually OT Goal Formulation: With patient Time For Goal Achievement: 08/06/18 Potential to Achieve Goals: Good ADL Goals Pt Will Perform Grooming: with modified independence;standing Pt Will Perform Lower Body Bathing: with modified independence;with adaptive equipment;sit to/from stand Pt Will Perform Lower Body Dressing: with modified independence;with adaptive equipment;sit to/from stand Pt Will Transfer to Toilet: with modified independence;ambulating;bedside commode(over toilet) Pt Will Perform Toileting - Clothing Manipulation and hygiene: with modified independence;sit to/from stand Additional ADL Goal #1: Pt will adhere to sternal precautions during ADL and mobility.  OT Frequency: Min 2X/week   Barriers to D/C: Decreased caregiver support          Co-evaluation              AM-PAC PT "6 Clicks" Daily Activity     Outcome Measure Help from another person eating meals?: None Help from another person taking care of personal grooming?: A Little Help from another person toileting, which includes using toliet, bedpan, or urinal?: A Little Help from another person bathing (including washing, rinsing, drying)?: A Lot Help from another person to put on and taking off regular upper body clothing?: A Lot   6 Click Score: 14   End of Session Equipment Utilized During Treatment: Gait belt;Rolling walker  Activity Tolerance: Patient tolerated treatment well Patient left: in chair;with call bell/phone within reach  OT Visit Diagnosis: Unsteadiness on feet (R26.81);Other abnormalities of  gait and mobility (R26.89);Pain;Muscle weakness (generalized) (M62.81)                Time: 8250-5397 OT Time Calculation (min): 18 min Charges:  OT General Charges $OT Visit: 1 Visit OT Evaluation $OT Eval Moderate Complexity: 1 Mod  Nestor Lewandowsky, OTR/L Acute Rehabilitation Services Pager: [phone] Office: [phone]  Malka So 07/23/2018, 12:47 PM

## 2018-07-23 NOTE — Care Management Important Message (Signed)
Important Message  Patient Details  Name: Lawrence [name] MRN: [id number] Date of Birth: October 13, 1951   Medicare Important Message Given:  Yes    Lawrence [name] 07/23/2018, 1:24 PM

## 2018-07-24 NOTE — Progress Notes (Signed)
[address]       [place] [address]             [phone]                 4 Days Post-Op Procedure(s) (LRB): REPLACMENT OF ASCENDING AORTA WITH 32MM GRAFT. HYPOTHERMIC CIRCULATORY ARREST. WHEAT PROCEDURE. (N/A) TRANSESOPHAGEAL ECHOCARDIOGRAM (TEE) (N/A)  LOS: 4 days   Subjective: Feels well today, being evaluated for d/c to pen center   Objective: Vital signs in last 24 hours: Patient Vitals for the past 24 hrs:  BP Temp Temp src Pulse Resp SpO2 Weight  07/24/18 0701 113/90 98.3 F (36.8 C) Oral 77 (!) 27 95 % -  07/24/18 0500 - - - - - - 95.9 kg  07/24/18 0400 110/79 98.3 F (36.8 C) Oral 72 19 97 % -  07/23/18 2359 118/84 (!) 97.1 F (36.2 C) Oral 75 (!) 22 96 % -  07/23/18 2009 136/80 98.2 F (36.8 C) Oral 85 (!) 26 92 % -  07/23/18 1655 - - - - - 97 % -  07/23/18 1341 101/70 98.1 F (36.7 C) Oral 75 (!) 23 94 % -  07/23/18 0838 112/73 - - 93 - - -    Filed Weights   07/22/18 0600 07/23/18 0627 07/24/18 0500  Weight: 98.8 kg 98.7 kg 95.9 kg    Hemodynamic parameters for last 24 hours:    Intake/Output from previous day: 09/12 0701 - 09/13 0700 In: 480 [P.O.:480] Out: 1500 [Urine:1500] Intake/Output this shift: No intake/output data recorded.  Scheduled Meds: . acetaminophen  1,000 mg Oral Q6H   Or  . acetaminophen (TYLENOL) oral liquid 160 mg/5 mL  1,000 mg Per Tube Q6H  . aspirin EC  325 mg Oral Daily   Or  . aspirin  324 mg Per Tube Daily  . bisacodyl  10 mg Oral Daily   Or  . bisacodyl  10 mg Rectal Daily  . Chlorhexidine Gluconate Cloth  6 each Topical Daily  . docusate sodium  200 mg Oral Daily  . enoxaparin (LOVENOX) injection  30 mg Subcutaneous QHS  . Influenza vac split quadrivalent PF  0.5 mL Intramuscular Tomorrow-1000  . insulin aspart  0-24 Units Subcutaneous TID WC  . metoprolol tartrate  12.5 mg Oral BID  . mupirocin ointment  1 application Nasal BID  . pantoprazole  40 mg Oral Daily  . sodium chloride  flush  3 mL Intravenous Q12H  . sodium chloride flush  3 mL Intravenous Q12H   Continuous Infusions: . sodium chloride    . lactated ringers Stopped (07/22/18 0900)  . lactated ringers 20 mL/hr at 07/20/18 2115   PRN Meds:.sodium chloride, metoprolol tartrate, ondansetron (ZOFRAN) IV, oxyCODONE, sodium chloride flush, sodium chloride flush, traMADol  General appearance: alert, cooperative and no distress Neurologic: intact Heart: regular rate and rhythm, S1, S2 normal, no murmur, click, rub or gallop Lungs: clear to auscultation bilaterally Abdomen: soft, non-tender; bowel sounds normal; no masses,  no organomegaly Extremities: extremities normal, atraumatic, no cyanosis or edema and Homans sign is negative, no sign of DVT Wound: sternum intact, aquseal dressing intact  Lab Results: CBC: Recent Labs    07/22/18 0426 07/23/18 0413  WBC 12.2* 9.0  HGB 10.2* 9.4*  HCT 31.5* 29.1*  PLT 106* 116*   BMET:  Recent Labs    07/22/18 0426 07/23/18 0413  NA 137 139  K 4.2 4.1  CL 107 106  CO2  26 28  GLUCOSE 149* 114*  BUN 24* 22  CREATININE 1.00 0.94  CALCIUM 7.9* 8.1*    PT/INR: No results for input(s): LABPROT, INR in the last 72 hours.   Radiology No results found.   Assessment/Plan: S/P Procedure(s) (LRB): REPLACMENT OF ASCENDING AORTA WITH 32MM GRAFT. HYPOTHERMIC CIRCULATORY ARREST. WHEAT PROCEDURE. (N/A) TRANSESOPHAGEAL ECHOCARDIOGRAM (TEE) (N/A) Mobilize Plan for discharge: to pen center tomorrow    [name] MD 07/24/2018 8:26 AM

## 2018-07-24 NOTE — Progress Notes (Signed)
Physical Therapy Treatment Patient Details Name: Lawrence [name] MRN: [id number] DOB: 03/31/1951 Today's Date: 07/24/2018    History of Present Illness Pt is a 67 y.o. male admitted 07/20/18 with thoracic aortic aneurysm now s/p replacement. PMH includes HTN, arthritis.    PT Comments    Pt making steady progress with mobility. Continue to recommend ST-SNF prior to return home alone so pt can be more independent and safer.    Follow Up Recommendations  SNF;Supervision for mobility/OOB     Equipment Recommendations  Rolling walker with 5" wheels    Recommendations for Other Services       Precautions / Restrictions Precautions Precautions: Fall;Sternal Precaution Comments: sternal/abdominal incision    Mobility  Bed Mobility Overal bed mobility: Needs Assistance Bed Mobility: Supine to Sit     Supine to sit: Min assist     General bed mobility comments: Assist to elevate trunk into standing  Transfers Overall transfer level: Needs assistance Equipment used: Rolling walker (2 wheeled) Transfers: Sit to/from Stand Sit to Stand: Min guard         General transfer comment: cues for hands to knees to rise and then to sit  Ambulation/Gait Ambulation/Gait assistance: Min guard   Assistive device: Rolling walker (2 wheeled);4-wheeled walker Gait Pattern/deviations: Step-through pattern;Decreased stride length;Trunk flexed Gait velocity: decr Gait velocity interpretation: 1.31 - 2.62 ft/sec, indicative of limited community ambulator General Gait Details: assist for safety. Verbal cues to stand more erect. Amb on RA with dyspnea 2/4 and SpO2 98%.    Stairs             Wheelchair Mobility    Modified Rankin (Stroke Patients Only)       Balance Overall balance assessment: Needs assistance Sitting-balance support: No upper extremity supported Sitting balance-Leahy Scale: Good     Standing balance support: No upper extremity supported;During functional  activity Standing balance-Leahy Scale: Fair                              Cognition Arousal/Alertness: Awake/alert Behavior During Therapy: WFL for tasks assessed/performed Overall Cognitive Status: Within Functional Limits for tasks assessed                                        Exercises      General Comments        Pertinent Vitals/Pain Pain Assessment: No/denies pain    Home Living                      Prior Function            PT Goals (current goals can now be found in the care plan section) Progress towards PT goals: Progressing toward goals    Frequency    Min 2X/week      PT Plan Current plan remains appropriate    Co-evaluation              AM-PAC PT "6 Clicks" Daily Activity  Outcome Measure  Difficulty turning over in bed (including adjusting bedclothes, sheets and blankets)?: A Little Difficulty moving from lying on back to sitting on the side of the bed? : Unable Difficulty sitting down on and standing up from a chair with arms (e.g., wheelchair, bedside commode, etc,.)?: Unable Help needed moving to and from a bed to chair (including a wheelchair)?:  A Little Help needed walking in hospital room?: A Little Help needed climbing 3-5 steps with a railing? : A Little 6 Click Score: 14    End of Session   Activity Tolerance: Patient tolerated treatment well Patient left: in chair;with call bell/phone within reach Nurse Communication: Mobility status PT Visit Diagnosis: Other abnormalities of gait and mobility (R26.89);Unsteadiness on feet (R26.81)     Time: 9692-4932 PT Time Calculation (min) (ACUTE ONLY): 16 min  Charges:  $Gait Training: 8-22 mins                     [place] Pager [phone] Office [place] 07/24/2018, 4:05 PM

## 2018-07-24 NOTE — Discharge Instructions (Signed)
1. Please obtain vital signs at least one time daily 2. Please weigh the patient daily. If he or she continues to gain weight or develops lower extremity edema, contact the office at [phone]. 3. Ambulate patient at least three times daily and please use sternal precautions.   Discharge Instructions:  1. You may shower, please wash incisions daily with soap and water and keep dry.  If you wish to cover wounds with dressing you may do so but please keep clean and change daily.  No tub baths or swimming until incisions have completely healed.  If your incisions become red or develop any drainage please call our office at [phone]  2. No Driving until cleared by Dr. Everrett Coombe office and you are no longer using narcotic pain medications  3. Monitor your weight daily.. Please use the same scale and weigh at same time... If you gain 5-10 lbs in 48 hours with associated lower extremity swelling, please contact our office at [phone]  4. Fever of 101.5 for at least 24 hours with no source, please contact our office at [phone]  5. Activity- up as tolerated, please walk at least 3 times per day.  Avoid strenuous activity, no lifting, pushing, or pulling with your arms over 8-10 lbs for a minimum of 6 weeks  6. If any questions or concerns arise, please do not hesitate to contact our office at [phone]

## 2018-07-24 NOTE — Progress Notes (Signed)
CSW met patient and friend Waunita Schooner) at bedside. CSW informed patient that [facility] is unable to offer a bed to patient since facility is full. Patient and Waunita Schooner stated they will take the offer from [facility]. CSW contacted facility and spoke with admission coordinator Debbie. Jackelyn Poling stated they are able to take patient and started authorization through insurance. Jackelyn Poling stated she will reach back out to [place] once authorization has been obtained    Rhea Pink, MSW,  [place]

## 2018-07-24 NOTE — Anesthesia Postprocedure Evaluation (Signed)
Anesthesia Post Note  Patient: Lawrence [name]  Procedure(s) Performed: REPLACMENT OF ASCENDING AORTA WITH 32MM GRAFT. HYPOTHERMIC CIRCULATORY ARREST. WHEAT PROCEDURE. (N/A Chest) TRANSESOPHAGEAL ECHOCARDIOGRAM (TEE) (N/A )     Patient location during evaluation: SICU Anesthesia Type: General Level of consciousness: sedated Pain management: pain level controlled Vital Signs Assessment: post-procedure vital signs reviewed and stable Respiratory status: patient remains intubated per anesthesia plan Cardiovascular status: stable Postop Assessment: no apparent nausea or vomiting Anesthetic complications: no    Last Vitals:  Vitals:   07/24/18 0400 07/24/18 0701  BP: 110/79 113/90  Pulse: 72 77  Resp: 19 (!) 27  Temp: 36.8 C 36.8 C  SpO2: 97% 95%    Last Pain:  Vitals:   07/24/18 0701  TempSrc: Oral  PainSc:                  Miroslav Gin

## 2018-07-24 NOTE — Progress Notes (Signed)
CARDIAC REHAB PHASE I   PRE:  Rate/Rhythm: 75 SR    BP: sitting 131/84    SaO2: 97 RA  MODE:  Ambulation: 350 ft   POST:  Rate/Rhythm: 95 SR    BP: sitting 140/83     SaO2: 98 RA  Tolerated well with RW. Not as stiff today. No c/o. To bed. Discussed sternal precautions, IS, ex gl, diet (pre DM), and CRPII. Will send referral to [facility] however he will need to check with his insurance.  [phone]   [place], ACSM 07/24/2018 11:06 AM

## 2018-07-24 NOTE — Plan of Care (Signed)
  Problem: Health Behavior/Discharge Planning: Goal: Ability to manage health-related needs will improve Outcome: Progressing   Problem: Clinical Measurements: Goal: Ability to maintain clinical measurements within normal limits will improve Outcome: Progressing Goal: Respiratory complications will improve Outcome: Progressing Goal: Cardiovascular complication will be avoided Outcome: Progressing   Problem: Elimination: Goal: Will not experience complications related to urinary retention Outcome: Progressing   Problem: Pain Managment: Goal: General experience of comfort will improve Outcome: Progressing   Problem: Skin Integrity: Goal: Risk for impaired skin integrity will decrease Outcome: Progressing   Problem: Elimination: Goal: Will not experience complications related to bowel motility Outcome: Not Progressing

## 2018-07-24 NOTE — Discharge Summary (Signed)
Physician Discharge Summary  Patient ID: Lawrence [name] MRN: [id number] DOB/AGE: 1951/05/24 67 y.o.  Admit date: 07/20/2018 Discharge date: 07/27/2018  Admission Diagnoses:  Patient Active Problem List   Diagnosis Date Noted  . Sebaceous cyst 03/06/2018  . 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 Essential hypertension 09/01/2017  . Aortic atherosclerosis ([place])   . Aortic insufficiency   . Aortic stenosis   . Bicuspid aortic valve   . 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 History of colonic polyps 08/01/2016   Discharge Diagnoses:   Patient Active Problem List   Diagnosis Date Noted  . 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 Sebaceous cyst 03/06/2018  . 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 Essential hypertension 09/01/2017  . Aortic atherosclerosis ([place])   . Aortic insufficiency   . Aortic stenosis   . Bicuspid aortic valve   . 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 History of colonic polyps 08/01/2016   Discharged Condition: good  History of Present Illness:  [name] is a 67 yo white male with history of bicuspid Aortic Valve and a dilated Ascending Aorta.  The patient states that in the Spring of 2018 he was found to have a soft heart murmur.  Echocardiogram was obtained and showed a bicuspid aortic valve.  In October of last year the patient developed chest pain and presented to the Emergency room for evaluation.  CTA of his chest was obtained and showed a dilated ascending aorta of 5.0 cm without evidence of dissection.  Due to this he was referred to TCTS for evaluation.  At his initial evaluation Dr. Servando Snare told the patient surgery would be indicated, however he would need to proceed with cardiac catheterization prior to his surgery to ensure he would not require bypass surgery as well.  He underwent cardiac catheterization by Dr. Aundra Dubin which revealed no significant CAD.  The patient did not return for follow up as he was told everything was okay from his catheterization and he  assumed he did not need to return.  He presented for follow with Cardiology in the spring who repeated a CTA of the chest which showed his aortic aneurysm to be stable.  Repeat Echocardiogram showed mild to moderate aortic regurgitation.  He was again referred to TCTS and Dr. Servando Snare recommended the patient proceed with repair of his ascending aortic aneurysm.  The risk and benefits of the procedure were explained to the patient and he was agreeable to proceed.  Hospital Course:   [name] presented to [facility] on 07[facility]20/2018.  He was taken to the operating room and underwent Supra coronary replacement of ascending aorta with a 32 mm Hemashield graft under hypothermic circulatory areest and antegrade cerebral perfusion.  He tolerated the procedure without difficulty and was taken to the SICU in stable condition.  He was extubated the evening of surgery.  During his stay in the SICU his chest tubes and arterial lines were removed without difficulty.  He was weaned off pressor support as tolerated.  He was volume overloaded and started on a regimen of diuretics.  He was maintaining NSR.  He was ambulating with assistance.  He was medically stable for transfer to 4E on 07/22/2018.  He continued to make progress.  He remains in NSR.  His pacing wires have been removed without difficulty.  He lives alone and was evaluated by PT/OT for skilled nursing placement. His incision is healing without evidence of infection.  He is tolerating a heart healthy  diet.  He continues to ambulate with assistance.  He is medically stable for discharge to [facility] today.   Significant Diagnostic Studies: cardiac graphics:   Echocardiogram:   - Left ventricle: The cavity size was normal. Wall thickness was   increased in a pattern of moderate LVH. Systolic function was   normal. The estimated ejection fraction was in the range of 55%   to 60%. The study is not technically sufficient to allow   evaluation of LV  diastolic function. - Aortic valve: Probable bicuspid aortic valve. There was mild to   moderate regurgitation. Valve area (VTI): 1.8 cm^2. Valve area   (Vmax): 1.73 cm^2. Valve area (Vmean): 2.48 cm^2. - Aorta: The visualized portion of the proximal ascending aorta is   moderately enlarged at 4.5 cm. Aortic root dimension: 41 mm (ED). - Aortic root: The aortic root was mildly dilated. - Technically adequate study.  CTA Chest:  Stable 5 cm ascending thoracic aorta fusiform aneurysm.  Recommend semi-annual imaging followup by CTA or MRA and referral to cardiothoracic surgery if not already obtained. This recommendation follows 2010 ACCF/AHA/AATS/ACR/ASA/SCA/SCAI/SIR/STS/SVM Guidelines for the Diagnosis and Management of Patients With Thoracic Aortic Disease. Circulation. 2010; 121: e266-e36  No acute intrathoracic process.  Bibasilar atelectasis/scarring  Emphysema (ICD10-J43.9).  Treatments: surgery:    Replacement of ascending aorta supracoronary with 32 mm Hemashield graft with hypothermic circulatory arrest and antegrade cerebral perfusion.  Discharge Exam: Blood pressure (!) 148/93, pulse 97, temperature 98.1 F (36.7 C), temperature source Oral, resp. rate (!) 25, height 5\' 10"  (1.778 m), weight 93.3 kg, SpO2 98 %.  General appearance: alert, cooperative and no distress Heart: regular rate and rhythm, S1, S2 normal, no murmur, click, rub or gallop Lungs: clear to auscultation bilaterally and crackles in the lower lobes Abdomen: soft, non-tender; bowel sounds normal; no masses,  no organomegaly Extremities: extremities normal, atraumatic, no cyanosis or edema Wound: clean and dry  Disposition: Discharge disposition: 01-Home or Self Care     SNF  Discharge Medications:  The patient has been discharged on:   1.Beta Blocker:  Yes [ x  ]                              No   [   ]                              If No, reason:  2.Ace Inhibitor/ARB: Yes [   ]                                      No  [ x   ]                                     If No, reason: labile BP  3.Statin:   Yes [   ]                  No  [  x ]                  If No, reason: No CAD  4.Shela CommonsVelta Addison  [ x  ]  No   [   ]                  If No, reason:     Discharge Instructions    Amb Referral to Cardiac Rehabilitation   Complete by:  As directed    Please check that pt has coverage   Diagnosis:  Other Comment - ascending aorta replacement     Allergies as of 07/27/2018      Reactions   Ciprofloxacin    Morphine And Related Other (See Comments)   Caused patient to pass out.       Medication List    STOP taking these medications   amLODipine 5 MG tablet Commonly known as:  NORVASC   cyclobenzaprine 5 MG tablet Commonly known as:  FLEXERIL   nitroGLYCERIN 0.3 MG SL tablet Commonly known as:  NITROSTAT     TAKE these medications   acetaminophen 500 MG tablet Commonly known as:  TYLENOL Take 2 tablets (1,000 mg total) by mouth every 6 (six) hours as needed (Moderate Pain).   allopurinol 300 MG tablet Commonly known as:  ZYLOPRIM Take 300 mg by mouth daily as needed (gout flare).   aspirin 325 MG EC tablet Take 1 tablet (325 mg total) by mouth daily.   calcium carbonate 500 MG chewable tablet Commonly known as:  TUMS - dosed in mg elemental calcium Chew 1-2 tablets by mouth 2 (two) times daily as needed for indigestion or heartburn (for stomach pain due to spicy foods).   lisinopril 20 MG tablet Commonly known as:  PRINIVIL,ZESTRIL Take 1 tablet (20 mg total) by mouth daily. What changed:    medication strength  how much to take   metoprolol tartrate 25 MG tablet Commonly known as:  LOPRESSOR Take 0.5 tablets (12.5 mg total) by mouth 2 (two) times daily. What changed:  how much to take   oxyCODONE 5 MG immediate release tablet Commonly known as:  Oxy IR/ROXICODONE Take 1 tablet (5 mg total) by mouth every 6 (six) hours as  needed for severe pain.      Follow-up Information    [name], MD Follow up on 08/27/2018.   Specialty:  Cardiothoracic Surgery Why:  Appointment is at 1:30, please get CXR at 1:00 at [place] located on first floor of our office building Contact information: [address] [place] [address] [phone]        [name], MD. Call in 1 day(s).   Specialty:  Internal Medicine Contact information: [place] [address] [phone]        Satira Sark, MD Follow up.   Specialty:  Cardiology Why:  Your appointment is at 8:40am on 08/13/2018 Contact information: [place] [address] [phone]           Signed: Elgie Collard 07/27/2018, 8:12 AM

## 2018-07-25 ENCOUNTER — Inpatient Hospital Stay (HOSPITAL_COMMUNITY): Payer: Medicare HMO

## 2018-07-25 MED ORDER — MAGNESIUM HYDROXIDE 400 MG/5ML PO SUSP
30.0000 mL | Freq: Every day | ORAL | Status: DC | PRN
  Administered 2018-07-25: 30 mL via ORAL
  Filled 2018-07-25: qty 30

## 2018-07-25 NOTE — Progress Notes (Signed)
Called facility to see if authorization has been approved for short-term rehab. Authorization still pending. Facility will callback CSW when approved. Will continue to assist as needed.

## 2018-07-25 NOTE — Progress Notes (Signed)
CARDIAC REHAB PHASE I   PRE:  Rate/Rhythm: 90ST  BP:  Sitting: 139/96    MODE:  Ambulation: 470 ft   POST:  Rate/Rhythm: 100 ST  BP:  Sitting: 151/96      1130-1150  Pt ambulated 470 ft with RW. Steady gait.  No complaints. Pt did not report any questions from education completed yesterday.  To chair after walk with call bell within reach.   Noel Christmas, RN 07/25/2018 12:00 PM

## 2018-07-25 NOTE — Plan of Care (Signed)
  Problem: Health Behavior/Discharge Planning: Goal: Ability to manage health-related needs will improve Outcome: Progressing   Problem: Clinical Measurements: Goal: Will remain free from infection Outcome: Progressing Goal: Respiratory complications will improve Outcome: Progressing   Problem: Activity: Goal: Risk for activity intolerance will decrease Outcome: Progressing   Problem: Pain Managment: Goal: General experience of comfort will improve Outcome: Progressing   

## 2018-07-25 NOTE — Progress Notes (Addendum)
[address]       [place] [address]             [phone]      5 Days Post-Op Procedure(s) (LRB): REPLACMENT OF ASCENDING AORTA WITH 32MM GRAFT. HYPOTHERMIC CIRCULATORY ARREST. WHEAT PROCEDURE. (N/A) TRANSESOPHAGEAL ECHOCARDIOGRAM (TEE) (N/A) Subjective: Feels pretty well  Objective: Vital signs in last 24 hours: Temp:  [97.8 F (36.6 C)-98.7 F (37.1 C)] 97.8 F (36.6 C) (09/14 0400) Pulse Rate:  [57-78] 62 (09/14 0747) Cardiac Rhythm: Normal sinus rhythm (09/14 0701) Resp:  [11-28] 21 (09/14 0747) BP: (126-148)/(71-89) 136/73 (09/14 0747) SpO2:  [92 %-97 %] 97 % (09/14 0400) Weight:  [96.2 kg] 96.2 kg (09/14 0500)  Hemodynamic parameters for last 24 hours:    Intake/Output from previous day: 09/13 0701 - 09/14 0700 In: 200 [P.O.:200] Out: 1200 [Urine:1200] Intake/Output this shift: No intake/output data recorded.  General appearance: alert, cooperative and no distress Heart: regular rate and rhythm Lungs: clear to auscultation bilaterally Abdomen: mild distension, + hyperactive BS Extremities: no edema Wound: incis healing well  Lab Results: Recent Labs    07/23/18 0413  WBC 9.0  HGB 9.4*  HCT 29.1*  PLT 116*   BMET:  Recent Labs    07/23/18 0413  NA 139  K 4.1  CL 106  CO2 28  GLUCOSE 114*  BUN 22  CREATININE 0.94  CALCIUM 8.1*    PT/INR: No results for input(s): LABPROT, INR in the last 72 hours. ABG    Component Value Date/Time   PHART 7.339 (L) 07/21/2018 0000   HCO3 21.3 07/21/2018 0000   TCO2 23 07/21/2018 1555   ACIDBASEDEF 4.0 (H) 07/21/2018 0000   O2SAT 97.0 07/21/2018 0000   CBG (last 3)  Recent Labs    07/23/18 0356 07/23/18 1146 07/23/18 1634  GLUCAP 108* 134* 98    Meds Scheduled Meds: . acetaminophen  1,000 mg Oral Q6H   Or  . acetaminophen (TYLENOL) oral liquid 160 mg/5 mL  1,000 mg Per Tube Q6H  . aspirin EC  325 mg Oral Daily   Or  . aspirin  324 mg Per Tube Daily  . bisacodyl   10 mg Oral Daily   Or  . bisacodyl  10 mg Rectal Daily  . Chlorhexidine Gluconate Cloth  6 each Topical Daily  . docusate sodium  200 mg Oral Daily  . enoxaparin (LOVENOX) injection  30 mg Subcutaneous QHS  . Influenza vac split quadrivalent PF  0.5 mL Intramuscular Tomorrow-1000  . insulin aspart  0-24 Units Subcutaneous TID WC  . metoprolol tartrate  12.5 mg Oral BID  . mupirocin ointment  1 application Nasal BID  . pantoprazole  40 mg Oral Daily  . sodium chloride flush  3 mL Intravenous Q12H  . sodium chloride flush  3 mL Intravenous Q12H   Continuous Infusions: . sodium chloride    . lactated ringers Stopped (07/22/18 0900)  . lactated ringers 20 mL/hr at 07/20/18 2115   PRN Meds:.sodium chloride, metoprolol tartrate, ondansetron (ZOFRAN) IV, oxyCODONE, sodium chloride flush, sodium chloride flush, traMADol  Xrays No results found.  Assessment/Plan: S/P Procedure(s) (LRB): REPLACMENT OF ASCENDING AORTA WITH 32MM GRAFT. HYPOTHERMIC CIRCULATORY ARREST. WHEAT PROCEDURE. (N/A) TRANSESOPHAGEAL ECHOCARDIOGRAM (TEE) (N/A)   1 conts to do well 2 awaits final nursing facility approvals, no bed at [facility] 3 add MOM for constipation 4 CXR, mild atx 5 blood sugars good control 6 no new labs  LOS: 5  days    John Giovanni 07/25/2018 Pager [phone]  patient examined and medical record reviewed,agree with above note. Tharon Aquas Trigt III 07/25/2018

## 2018-07-26 LAB — BASIC METABOLIC PANEL
Anion gap: 10 (ref 5–15)
BUN: 12 mg/dL (ref 8–23)
CO2: 27 mmol/L (ref 22–32)
Calcium: 9.3 mg/dL (ref 8.9–10.3)
Chloride: 103 mmol/L (ref 98–111)
Creatinine, Ser: 0.93 mg/dL (ref 0.61–1.24)
GFR calc Af Amer: >60 mL/min (ref >60)
GFR calc non Af Amer: >60 mL/min (ref >60)
Glucose, Bld: 110 mg/dL — ABNORMAL HIGH (ref 70–99)
Potassium: 4 mmol/L (ref 3.5–5.1)
Sodium: 140 mmol/L (ref 135–145)

## 2018-07-26 LAB — CBC
HCT: 33.6 % — ABNORMAL LOW (ref 39.0–52.0)
Hemoglobin: 11 g/dL — ABNORMAL LOW (ref 13.0–17.0)
MCH: 30.9 pg (ref 26.0–34.0)
MCHC: 32.7 g/dL (ref 30.0–36.0)
MCV: 94.4 fL (ref 78.0–100.0)
Platelets: 290 10*3/uL (ref 150–400)
RBC: 3.56 MIL/uL — ABNORMAL LOW (ref 4.22–5.81)
RDW: 13.2 % (ref 11.5–15.5)
WBC: 6.1 10*3/uL (ref 4.0–10.5)

## 2018-07-26 MED ORDER — ACETAMINOPHEN 500 MG PO TABS
1000.0000 mg | ORAL_TABLET | Freq: Four times a day (QID) | ORAL | Status: DC | PRN
  Administered 2018-07-26 – 2018-07-27 (×2): 1000 mg via ORAL
  Filled 2018-07-26 (×2): qty 2

## 2018-07-26 NOTE — Progress Notes (Addendum)
[address]       [place] [address]             [phone]      6 Days Post-Op Procedure(s) (LRB): REPLACMENT OF ASCENDING AORTA WITH 32MM GRAFT. HYPOTHERMIC CIRCULATORY ARREST. WHEAT PROCEDURE. (N/A) TRANSESOPHAGEAL ECHOCARDIOGRAM (TEE) (N/A) Subjective: conts to feel well, + BM yesterday   Objective: Vital signs in last 24 hours: Temp:  [97.9 F (36.6 C)-98.3 F (36.8 C)] 98 F (36.7 C) (09/15 0500) Pulse Rate:  [77-91] 77 (09/15 0500) Cardiac Rhythm: Normal sinus rhythm (09/15 0500) Resp:  [20-25] 25 (09/15 0500) BP: (138-146)/(85-96) 141/89 (09/15 0500) SpO2:  [95 %-96 %] 96 % (09/15 0500) Weight:  [93.8 kg] 93.8 kg (09/15 0500)  Hemodynamic parameters for last 24 hours:    Intake/Output from previous day: 09/14 0701 - 09/15 0700 In: 120 [P.O.:120] Out: 100 [Urine:100] Intake/Output this shift: No intake/output data recorded.  General appearance: alert, cooperative and no distress Heart: regular rate and rhythm and no murmur or rub Lungs: clear to auscultation bilaterally Abdomen: benign Extremities: minor edema Wound: incis healing well  Lab Results: Recent Labs    07/26/18 0606  WBC 6.1  HGB 11.0*  HCT 33.6*  PLT 290   BMET:  Recent Labs    07/26/18 0606  NA 140  K 4.0  CL 103  CO2 27  GLUCOSE 110*  BUN 12  CREATININE 0.93  CALCIUM 9.3    PT/INR: No results for input(s): LABPROT, INR in the last 72 hours. ABG    Component Value Date/Time   PHART 7.339 (L) 07/21/2018 0000   HCO3 21.3 07/21/2018 0000   TCO2 23 07/21/2018 1555   ACIDBASEDEF 4.0 (H) 07/21/2018 0000   O2SAT 97.0 07/21/2018 0000   CBG (last 3)  Recent Labs    07/23/18 1146 07/23/18 1634  GLUCAP 134* 98    Meds Scheduled Meds: . aspirin EC  325 mg Oral Daily   Or  . aspirin  324 mg Per Tube Daily  . bisacodyl  10 mg Oral Daily   Or  . bisacodyl  10 mg Rectal Daily  . docusate sodium  200 mg Oral Daily  . enoxaparin (LOVENOX)  injection  30 mg Subcutaneous QHS  . Influenza vac split quadrivalent PF  0.5 mL Intramuscular Tomorrow-1000  . insulin aspart  0-24 Units Subcutaneous TID WC  . metoprolol tartrate  12.5 mg Oral BID  . pantoprazole  40 mg Oral Daily  . sodium chloride flush  3 mL Intravenous Q12H  . sodium chloride flush  3 mL Intravenous Q12H   Continuous Infusions: . sodium chloride    . lactated ringers Stopped (07/22/18 0900)  . lactated ringers 20 mL/hr at 07/20/18 2115   PRN Meds:.sodium chloride, magnesium hydroxide, metoprolol tartrate, ondansetron (ZOFRAN) IV, oxyCODONE, sodium chloride flush, sodium chloride flush, traMADol  Xrays Dg Chest 2 View  Result Date: 07/25/2018 CLINICAL DATA:  Ascending aortic replacement.  Postop evaluation EXAM: CHEST - 2 VIEW COMPARISON:  Three days ago FINDINGS: Stable cardiopericardial size. Atelectasis or trace right pleural effusion. No pneumothorax. Atelectasis at the left base is improved. IMPRESSION: Improving lung volumes and atelectasis.  No visible pneumothorax. Electronically Signed   By: Monte Fantasia M.D.   On: 07/25/2018 09:29    Assessment/Plan: S/P Procedure(s) (LRB): REPLACMENT OF ASCENDING AORTA WITH 32MM GRAFT. HYPOTHERMIC CIRCULATORY ARREST. WHEAT PROCEDURE. (N/A) TRANSESOPHAGEAL ECHOCARDIOGRAM (TEE) (N/A)  1 conts to do well 2 awaits facility placement  approvals 3 some systolic htn, will add low dose ace-I 4 labs stable, H/H improved 5 BS control is good   LOS: 6 days    Lawrence [name] 07/26/2018 Pager [phone]  patient examined and medical record reviewed,agree with above note. Lawrence [name] 07/26/2018

## 2018-07-27 ENCOUNTER — Other Ambulatory Visit: Payer: Self-pay | Admitting: Physician Assistant

## 2018-07-27 MED ORDER — LISINOPRIL 20 MG PO TABS: 20.0000 mg | ORAL_TABLET | Freq: Every day | ORAL | 1 refills | Status: DC

## 2018-07-27 MED ORDER — OXYCODONE HCL 5 MG PO TABS: 5.0000 mg | ORAL_TABLET | Freq: Four times a day (QID) | ORAL | 0 refills | Status: DC | PRN

## 2018-07-27 MED ORDER — ASPIRIN 325 MG PO TBEC: 325.0000 mg | DELAYED_RELEASE_TABLET | Freq: Every day | ORAL | 0 refills | Status: DC

## 2018-07-27 MED ORDER — METOPROLOL TARTRATE 25 MG PO TABS: 12.5000 mg | ORAL_TABLET | Freq: Two times a day (BID) | ORAL | 1 refills | Status: DC

## 2018-07-27 MED ORDER — LISINOPRIL 10 MG PO TABS
20.0000 mg | ORAL_TABLET | Freq: Every day | ORAL | Status: DC
  Administered 2018-07-27: 20 mg via ORAL
  Filled 2018-07-27: qty 2

## 2018-07-27 MED ORDER — ACETAMINOPHEN 500 MG PO TABS: 1000.0000 mg | ORAL_TABLET | Freq: Four times a day (QID) | ORAL | 0 refills | Status: DC | PRN

## 2018-07-27 NOTE — Progress Notes (Signed)
Clinical Social Worker following patient for support and discharge needs. At this time per medical team patient is no longer discharging to a facility but will go home with home health. Patient received authorization today but facility cancelled it due to new discharge plan. CSW signing off as patient social work needs have been met.      , MSW,  LCSWA [phone]   

## 2018-07-27 NOTE — Progress Notes (Signed)
Pt up in room independently. Now for d/c home so reviewed walking guidelines for home and restrictions. Encouraged IS. Pt is motivated. [place], ACSM 8:25 AM 07/27/2018

## 2018-07-27 NOTE — Consult Note (Signed)
            [facility] CM Primary Care Navigator  07/27/2018  Lawrence [name] [birth year] [id number]   Went to seepatient at the bedside to identify possible discharge needs but he wasalreadydischargedper staff. Per chart review, patient was discharged home with home health services.   Per MD note,patient presented for evaluation of chest pain. (status post cardiac cath, repair/ replacement of his ascending aortic aneurysm).   Patient has discharge instruction to follow-up with primary care provider in 1 day; cardiology follow-up on 08/13/18.    For additional questions please contact:  Edwena Felty A. Shad Ledvina, BSN, RN-BC [facility] PRIMARY CARE Navigator Cell: [phone]

## 2018-07-27 NOTE — Progress Notes (Signed)
07/27/2018 0745 Pt IV d/c from with no complications. Pt d/c from [place] notified.  Amanda Cockayne, RN

## 2018-07-27 NOTE — Care Management Note (Signed)
Case Management Note Previous CM note completed by  Midge Minium RN,  BSN, NCM-BC, ACM-RN, 07/22/18  Patient Details  Name: Lawrence [name] MRN: [id number] Date of Birth: [birth year]  Subjective/Objective:  S/p replacement of ascending aortic aneurysm                  Action/Plan: CM consult acknowledged-request to eval patient d/t living at home alone. Patient lives at home alone, was independent with ADLs PTA, with occasional use of a cane to assist with ambulation d/t hx of back pain. PCP verified as: Dr. Allyn Kenner; Patient's pharmacy of choice is [facility], [place]. Patient reported having assistance post transition during the day, but no one available to provide assistance at night. CM discussed Short Term SNF for Rehab d/t post surgical deconditioning and no available 24hr support, with patient agreeable, and requesting a facility in [place] near [place]. CM informed patient authorization from [facility] would be required for Short Term SNF, with PT/OT notes needed, with patient verbalizing understanding. SW consult needed to initiate SNF placement process. CM will continue to follow.   Expected Discharge Date:  07/27/18               Expected Discharge Plan:  [place]  In-House Referral:  NA  Discharge planning Services  CM Consult, Other - See comment(Lives alone without assistance)  Post Acute Care Choice:  Durable Medical Equipment, Home Health Choice offered to:  Patient  DME Arranged:  [place] rolling DME Agency:  [place]:  RN [facility] Agency:  [place]  Status of Service:  Completed, signed off  If discussed at [place] of Stay Meetings, dates discussed:    Additional Comments:  07/27/18- 1030- Reve Crocket RN, CM- pt stable for discharge today, pt has decided to return home and not go to STSNF. Pt reports that he has found friends and neighbors that will assist and look in on him. HHRN and DME order  for RW has been placed, CM has spoken with pt at bedside. Choice offered for [facility] agency - per pt he does not have a preference and is fine using hospital affiliated agency- [facility] for both [facility] and DME needs. Confirmed address and phone # in epic and PCP Allyn Kenner. Referral called to [facility] with [facility] for RW- which will be delivered to room prior to discharge, Butch Penny with [facility] called for referral on [facility]- referral accepted.   Marvetta Gibbons [place], RN 07/27/2018, 10:41 AM [phone] 4E Transition Care Coordinator

## 2018-07-27 NOTE — Progress Notes (Signed)
07/27/2018 11:59 AM Discharge AVS meds taken today and those due this evening reviewed.  Follow-up appointments and when to call md reviewed.  D/C IV and TELE.  Questions and concerns addressed.   D/C home per orders. [place]

## 2018-07-27 NOTE — Progress Notes (Signed)
      [address]       [place] [address]             [phone]      7 Days Post-Op Procedure(s) (LRB): REPLACMENT OF ASCENDING AORTA WITH 32MM GRAFT. HYPOTHERMIC CIRCULATORY ARREST. WHEAT PROCEDURE. (N/A) TRANSESOPHAGEAL ECHOCARDIOGRAM (TEE) (N/A) Subjective: Feels okay this morning. He is looking forward to going home.  Objective: Vital signs in last 24 hours: Temp:  [97.6 F (36.4 C)-98.4 F (36.9 C)] 98.1 F (36.7 C) (09/16 0747) Pulse Rate:  [84-104] 97 (09/16 0747) Cardiac Rhythm: Normal sinus rhythm (09/16 0227) Resp:  [20-25] 25 (09/16 0747) BP: (145-161)/(87-98) 148/93 (09/16 0747) SpO2:  [95 %-98 %] 98 % (09/16 0220) Weight:  [93.3 kg] 93.3 kg (09/16 0240)     Intake/Output from previous day: 09/15 0701 - 09/16 0700 In: 120 [P.O.:120] Out: -  Intake/Output this shift: No intake/output data recorded.  General appearance: alert, cooperative and no distress Heart: regular rate and rhythm, S1, S2 normal, no murmur, click, rub or gallop Lungs: clear to auscultation bilaterally and crackles in the lower lobes Abdomen: soft, non-tender; bowel sounds normal; no masses,  no organomegaly Extremities: extremities normal, atraumatic, no cyanosis or edema Wound: clean and dry  Lab Results: Recent Labs    07/26/18 0606  WBC 6.1  HGB 11.0*  HCT 33.6*  PLT 290   BMET:  Recent Labs    07/26/18 0606  NA 140  K 4.0  CL 103  CO2 27  GLUCOSE 110*  BUN 12  CREATININE 0.93  CALCIUM 9.3    PT/INR: No results for input(s): LABPROT, INR in the last 72 hours. ABG    Component Value Date/Time   PHART 7.339 (L) 07/21/2018 0000   HCO3 21.3 07/21/2018 0000   TCO2 23 07/21/2018 1555   ACIDBASEDEF 4.0 (H) 07/21/2018 0000   O2SAT 97.0 07/21/2018 0000   CBG (last 3)  No results for input(s): GLUCAP in the last 72 hours.  Assessment/Plan: S/P Procedure(s) (LRB): REPLACMENT OF ASCENDING AORTA WITH 32MM GRAFT. HYPOTHERMIC CIRCULATORY ARREST.  WHEAT PROCEDURE. (N/A) TRANSESOPHAGEAL ECHOCARDIOGRAM (TEE) (N/A)  1. CV-NSR in the 90s to ST, Hypertension-Will add ACEI this morning.  2. Pulm-tolerating room air with good oxygen saturation.  3. Creatinine 0.93, electrolytes okay 4. H and H has been stable, 11.0/33.6 5. Endo-blood glucose well controlled.   Plan: no longer going to a facility. Discharge today.     LOS: 7 days    Elgie Collard 07/27/2018

## 2018-07-28 DIAGNOSIS — I700 Atherosclerosis of aorta: Secondary | ICD-10-CM | POA: Diagnosis not present

## 2018-07-28 DIAGNOSIS — I10 Essential (primary) hypertension: Secondary | ICD-10-CM | POA: Diagnosis not present

## 2018-07-28 DIAGNOSIS — M1990 Unspecified osteoarthritis, unspecified site: Secondary | ICD-10-CM | POA: Diagnosis not present

## 2018-07-28 DIAGNOSIS — M109 Gout, unspecified: Secondary | ICD-10-CM | POA: Diagnosis not present

## 2018-07-28 DIAGNOSIS — I2510 Atherosclerotic heart disease of native coronary artery without angina pectoris: Secondary | ICD-10-CM | POA: Diagnosis not present

## 2018-07-28 DIAGNOSIS — K219 Gastro-esophageal reflux disease without esophagitis: Secondary | ICD-10-CM | POA: Diagnosis not present

## 2018-07-28 DIAGNOSIS — Z48812 Encounter for surgical aftercare following surgery on the circulatory system: Secondary | ICD-10-CM | POA: Diagnosis not present

## 2018-07-28 DIAGNOSIS — Z952 Presence of prosthetic heart valve: Secondary | ICD-10-CM | POA: Diagnosis not present

## 2018-07-28 DIAGNOSIS — J439 Emphysema, unspecified: Secondary | ICD-10-CM | POA: Diagnosis not present

## 2018-07-30 ENCOUNTER — Other Ambulatory Visit: Payer: Self-pay

## 2018-07-30 DIAGNOSIS — M109 Gout, unspecified: Secondary | ICD-10-CM | POA: Diagnosis not present

## 2018-07-30 DIAGNOSIS — I700 Atherosclerosis of aorta: Secondary | ICD-10-CM | POA: Diagnosis not present

## 2018-07-30 DIAGNOSIS — I10 Essential (primary) hypertension: Secondary | ICD-10-CM | POA: Diagnosis not present

## 2018-07-30 DIAGNOSIS — I2510 Atherosclerotic heart disease of native coronary artery without angina pectoris: Secondary | ICD-10-CM | POA: Diagnosis not present

## 2018-07-30 DIAGNOSIS — K219 Gastro-esophageal reflux disease without esophagitis: Secondary | ICD-10-CM | POA: Diagnosis not present

## 2018-07-30 DIAGNOSIS — Z48812 Encounter for surgical aftercare following surgery on the circulatory system: Secondary | ICD-10-CM | POA: Diagnosis not present

## 2018-07-30 DIAGNOSIS — Z952 Presence of prosthetic heart valve: Secondary | ICD-10-CM | POA: Diagnosis not present

## 2018-07-30 DIAGNOSIS — J439 Emphysema, unspecified: Secondary | ICD-10-CM | POA: Diagnosis not present

## 2018-07-30 DIAGNOSIS — M1990 Unspecified osteoarthritis, unspecified site: Secondary | ICD-10-CM | POA: Diagnosis not present

## 2018-07-30 NOTE — Patient Outreach (Signed)
[facility]) Care Management  07/30/2018  Lawrence [name] 29-Aug-1951 [id number]   Referral Date: 07/30/18 Referral Source:  [employer] Report Date of Admission: 07/20/18 Diagnosis: Aortic Aneurysm Date of Discharge: 07/27/18 Facility: [place]: [facility]  Outreach attempt: spoke with patient.  He is able to verify HIPAA.  Patient states that he is doing well since being home.  He states he feels better than before hospitalization.  Patient lives alone but has friends that have been helping him.  Patient states he is independent with all aspects of care but has to remember to take it easy.  Cautioned patient about doing too much activity too soon and importance of following physician recommendations.  He verbalized understanding.  Patient taking medication as prescribed and able to review medication.  Home health has been out to visit.  Patient has several follow up appointments in the next 2 weeks.  He states he has transportation to appointments. Patient declines any needs or concerns at this time but appreciative of call.    Plan: RN CM will close case as patient expresses no needs and none identified.    Jone Baseman, RN, MSN [facility] - Inpatient Care Management Care Management Coordinator Direct Line [phone] Toll Free: [phone]  Fax: [phone]

## 2018-08-03 DIAGNOSIS — I2510 Atherosclerotic heart disease of native coronary artery without angina pectoris: Secondary | ICD-10-CM | POA: Diagnosis not present

## 2018-08-03 DIAGNOSIS — I700 Atherosclerosis of aorta: Secondary | ICD-10-CM | POA: Diagnosis not present

## 2018-08-03 DIAGNOSIS — Z952 Presence of prosthetic heart valve: Secondary | ICD-10-CM | POA: Diagnosis not present

## 2018-08-03 DIAGNOSIS — K219 Gastro-esophageal reflux disease without esophagitis: Secondary | ICD-10-CM | POA: Diagnosis not present

## 2018-08-03 DIAGNOSIS — J439 Emphysema, unspecified: Secondary | ICD-10-CM | POA: Diagnosis not present

## 2018-08-03 DIAGNOSIS — Z48812 Encounter for surgical aftercare following surgery on the circulatory system: Secondary | ICD-10-CM | POA: Diagnosis not present

## 2018-08-03 DIAGNOSIS — M1990 Unspecified osteoarthritis, unspecified site: Secondary | ICD-10-CM | POA: Diagnosis not present

## 2018-08-03 DIAGNOSIS — I10 Essential (primary) hypertension: Secondary | ICD-10-CM | POA: Diagnosis not present

## 2018-08-03 DIAGNOSIS — M109 Gout, unspecified: Secondary | ICD-10-CM | POA: Diagnosis not present

## 2018-08-04 ENCOUNTER — Emergency Department (HOSPITAL_COMMUNITY): Payer: Medicare Other

## 2018-08-04 ENCOUNTER — Telehealth: Payer: Self-pay | Admitting: *Deleted

## 2018-08-04 ENCOUNTER — Encounter (HOSPITAL_COMMUNITY): Payer: Self-pay

## 2018-08-04 ENCOUNTER — Other Ambulatory Visit: Payer: Self-pay

## 2018-08-04 ENCOUNTER — Inpatient Hospital Stay (HOSPITAL_COMMUNITY)
Admission: EM | Admit: 2018-08-04 | Discharge: 2018-08-05 | DRG: 309 | Disposition: A | Discharge status: Home / Self Care | Payer: Medicare Other | Attending: Cardiology | Admitting: Cardiology

## 2018-08-04 ENCOUNTER — Telehealth: Payer: Self-pay | Admitting: Cardiology

## 2018-08-04 DIAGNOSIS — I5030 Unspecified diastolic (congestive) heart failure: Secondary | ICD-10-CM

## 2018-08-04 DIAGNOSIS — Z87891 Personal history of nicotine dependence: Secondary | ICD-10-CM

## 2018-08-04 DIAGNOSIS — Z881 Allergy status to other antibiotic agents status: Secondary | ICD-10-CM | POA: Diagnosis not present

## 2018-08-04 DIAGNOSIS — Z9889 Other specified postprocedural states: Secondary | ICD-10-CM

## 2018-08-04 DIAGNOSIS — Z952 Presence of prosthetic heart valve: Secondary | ICD-10-CM | POA: Diagnosis not present

## 2018-08-04 DIAGNOSIS — I2510 Atherosclerotic heart disease of native coronary artery without angina pectoris: Secondary | ICD-10-CM | POA: Diagnosis not present

## 2018-08-04 DIAGNOSIS — R000 Tachycardia, unspecified: Secondary | ICD-10-CM | POA: Diagnosis not present

## 2018-08-04 DIAGNOSIS — I482 Chronic atrial fibrillation, unspecified: Secondary | ICD-10-CM | POA: Diagnosis present

## 2018-08-04 DIAGNOSIS — Z7982 Long term (current) use of aspirin: Secondary | ICD-10-CM

## 2018-08-04 DIAGNOSIS — I7121 Aneurysm of the ascending aorta, without rupture: Secondary | ICD-10-CM | POA: Diagnosis present

## 2018-08-04 DIAGNOSIS — I712 Thoracic aortic aneurysm, without rupture, unspecified: Secondary | ICD-10-CM | POA: Diagnosis present

## 2018-08-04 DIAGNOSIS — I700 Atherosclerosis of aorta: Secondary | ICD-10-CM | POA: Diagnosis present

## 2018-08-04 DIAGNOSIS — M1990 Unspecified osteoarthritis, unspecified site: Secondary | ICD-10-CM | POA: Diagnosis not present

## 2018-08-04 DIAGNOSIS — I480 Paroxysmal atrial fibrillation: Secondary | ICD-10-CM | POA: Diagnosis not present

## 2018-08-04 DIAGNOSIS — M109 Gout, unspecified: Secondary | ICD-10-CM | POA: Diagnosis not present

## 2018-08-04 DIAGNOSIS — R079 Chest pain, unspecified: Secondary | ICD-10-CM | POA: Diagnosis not present

## 2018-08-04 DIAGNOSIS — Z8371 Family history of colonic polyps: Secondary | ICD-10-CM

## 2018-08-04 DIAGNOSIS — Z79899 Other long term (current) drug therapy: Secondary | ICD-10-CM

## 2018-08-04 DIAGNOSIS — Q2543 Congenital aneurysm of aorta: Secondary | ICD-10-CM | POA: Diagnosis present

## 2018-08-04 DIAGNOSIS — Z885 Allergy status to narcotic agent status: Secondary | ICD-10-CM | POA: Diagnosis not present

## 2018-08-04 DIAGNOSIS — R0689 Other abnormalities of breathing: Secondary | ICD-10-CM | POA: Diagnosis not present

## 2018-08-04 DIAGNOSIS — Z48812 Encounter for surgical aftercare following surgery on the circulatory system: Secondary | ICD-10-CM | POA: Diagnosis not present

## 2018-08-04 DIAGNOSIS — Z8249 Family history of ischemic heart disease and other diseases of the circulatory system: Secondary | ICD-10-CM

## 2018-08-04 DIAGNOSIS — I10 Essential (primary) hypertension: Secondary | ICD-10-CM | POA: Diagnosis not present

## 2018-08-04 DIAGNOSIS — Q231 Congenital insufficiency of aortic valve: Secondary | ICD-10-CM

## 2018-08-04 DIAGNOSIS — I4891 Unspecified atrial fibrillation: Secondary | ICD-10-CM | POA: Diagnosis not present

## 2018-08-04 DIAGNOSIS — K219 Gastro-esophageal reflux disease without esophagitis: Secondary | ICD-10-CM | POA: Diagnosis present

## 2018-08-04 DIAGNOSIS — I313 Pericardial effusion (noninflammatory): Secondary | ICD-10-CM | POA: Diagnosis present

## 2018-08-04 DIAGNOSIS — Q2381 Bicuspid aortic valve: Secondary | ICD-10-CM

## 2018-08-04 DIAGNOSIS — I719 Aortic aneurysm of unspecified site, without rupture: Secondary | ICD-10-CM | POA: Diagnosis present

## 2018-08-04 DIAGNOSIS — I499 Cardiac arrhythmia, unspecified: Secondary | ICD-10-CM | POA: Diagnosis not present

## 2018-08-04 DIAGNOSIS — J439 Emphysema, unspecified: Secondary | ICD-10-CM | POA: Diagnosis not present

## 2018-08-04 LAB — BASIC METABOLIC PANEL
ANION GAP: 9 (ref 5–15)
BUN: 26 mg/dL — ABNORMAL HIGH (ref 8–23)
CHLORIDE: 106 mmol/L (ref 98–111)
CO2: 25 mmol/L (ref 22–32)
Calcium: 9.1 mg/dL (ref 8.9–10.3)
Creatinine, Ser: 1.05 mg/dL (ref 0.61–1.24)
GFR calc Af Amer: >60 mL/min (ref >60)
Glucose, Bld: 169 mg/dL — ABNORMAL HIGH (ref 70–99)
POTASSIUM: 4 mmol/L (ref 3.5–5.1)
SODIUM: 140 mmol/L (ref 135–145)

## 2018-08-04 LAB — ECHOCARDIOGRAM COMPLETE
HEIGHTINCHES: 70 in
WEIGHTICAEL: 3040 [oz_av]

## 2018-08-04 LAB — CBC
HEMATOCRIT: 39.6 % (ref 39.0–52.0)
HEMOGLOBIN: 13.1 g/dL (ref 13.0–17.0)
MCH: 31.4 pg (ref 26.0–34.0)
MCHC: 33.1 g/dL (ref 30.0–36.0)
MCV: 95 fL (ref 78.0–100.0)
Platelets: 526 10*3/uL — ABNORMAL HIGH (ref 150–400)
RBC: 4.17 MIL/uL — AB (ref 4.22–5.81)
RDW: 13.3 % (ref 11.5–15.5)
WBC: 7.1 10*3/uL (ref 4.0–10.5)

## 2018-08-04 LAB — TROPONIN I: TROPONIN I: 0.03 ng/mL — AB (ref <0.03)

## 2018-08-04 LAB — MAGNESIUM: MAGNESIUM: 2.1 mg/dL (ref 1.7–2.4)

## 2018-08-04 MED ORDER — ASPIRIN 325 MG PO TABS
325.0000 mg | ORAL_TABLET | Freq: Every day | ORAL | Status: DC
  Administered 2018-08-05: 325 mg via ORAL
  Filled 2018-08-04 (×2): qty 1

## 2018-08-04 MED ORDER — DILTIAZEM LOAD VIA INFUSION
10.0000 mg | Freq: Once | INTRAVENOUS | Status: AC
  Administered 2018-08-04: 10 mg via INTRAVENOUS

## 2018-08-04 MED ORDER — ACETAMINOPHEN 325 MG PO TABS: 650.0000 mg | ORAL_TABLET | ORAL | Status: DC | PRN

## 2018-08-04 MED ORDER — HEPARIN SODIUM (PORCINE) 5000 UNIT/ML IJ SOLN: 5000.0000 [IU] | Freq: Three times a day (TID) | INTRAMUSCULAR | Status: DC

## 2018-08-04 MED ORDER — ONDANSETRON HCL 4 MG/2ML IJ SOLN: 4.0000 mg | Freq: Four times a day (QID) | INTRAMUSCULAR | Status: DC | PRN

## 2018-08-04 MED ORDER — DILTIAZEM HCL-DEXTROSE 100-5 MG/100ML-% IV SOLN (PREMIX)
INTRAVENOUS | Status: AC
  Filled 2018-08-04: qty 100

## 2018-08-04 MED ORDER — SODIUM CHLORIDE 0.9 % IV BOLUS
500.0000 mL | Freq: Once | INTRAVENOUS | Status: AC
  Administered 2018-08-04: 500 mL via INTRAVENOUS

## 2018-08-04 MED ORDER — DILTIAZEM HCL-DEXTROSE 100-5 MG/100ML-% IV SOLN (PREMIX)
5.0000 mg/h | INTRAVENOUS | Status: DC
  Administered 2018-08-04: 5 mg/h via INTRAVENOUS
  Filled 2018-08-04: qty 100

## 2018-08-04 MED ORDER — METOPROLOL TARTRATE 25 MG PO TABS
25.0000 mg | ORAL_TABLET | Freq: Three times a day (TID) | ORAL | Status: DC
  Administered 2018-08-04: 25 mg via ORAL
  Filled 2018-08-04 (×2): qty 1

## 2018-08-04 NOTE — H&P (Signed)
Cardiology Admission History and Physical:   Patient ID: Lawrence [name] MRN: [id number]; DOB: 1951/01/06   Admission date: 08/04/2018  Primary Care Provider: [name], MD Primary Cardiologist: [name], MD  Primary Electrophysiologist:  na  Chief Complaint:    Patient Profile:   Lawrence [name] is a 67 y.o. male with  Lawrence [name] 67 yo male history of bicuspid aortic valve and aortic aneurysm s/p supracoronary replacement of ascending aorta with 32 mm Hemashield graft 07/21/2018. History of HTN. Presented with tachycardia and presyncope. By ER notes EMS called, patient evaluated and heart rates in 200s. Given adenosine x 3 without resolution, then given IV cardizem. In ER found to be in afib with RVR, started on dilt gtt.    History of Present Illness:   Lawrence [name] 67 yo male history of bicuspid aortic valve and aortic aneurysm s/p supracoronary replacement of ascending aorta with 32 mm Hemashield graft 07/21/2018. History of HTN. Presented with tachycardia and presyncope. By ER notes EMS called, patient evaluated and heart rates in 200s. Given adenosine x 3 without resolution, then given IV cardizem. In ER found to be in afib with RVR, started on dilt gtt.   He reports feeling well initially after recent discharge. Got his flu shot late last week. Was feeling very fatigued and drained over the weekend, he attributed to the flu shot. Progressive fatigue, dizziness. Symptoms progressed on Tuesday to point of presyncope with activities. EMS was called.   K 4, Cr 1.05, BUN 26, Mg 2.1, WBC 7.1 Trop 0.03 CXR no acute process  EKG afib with RVR 08/04/18 echo LVEF 55-60%, no WMAs, grade II diastolic dysfunction. Dilated IVC. Moderate pericardial effusion not conistent with tamponade.     Past Medical History:  Diagnosis Date  . Aortic atherosclerosis ([place])   . Aortic insufficiency   . Aortic stenosis   . Arthritis   . Bicuspid aortic valve    a. mild AS, mild-mod AI by echo  03/2017, 5cm thoracic aortic aneurysm by CT 07/2017.  Marland Kitchen Dyspnea   . Essential hypertension   . GERD (gastroesophageal reflux disease)   . Gout   . Heart murmur   . Thoracic aortic aneurysm ([place])    a. 07/2017: 5cm by CT.    Past Surgical History:  Procedure Laterality Date  . ASCENDING AORTIC ROOT REPLACEMENT N/A 07/20/2018   Procedure: REPLACMENT OF ASCENDING AORTA WITH 32MM GRAFT. HYPOTHERMIC CIRCULATORY ARREST. WHEAT PROCEDURE.;  Surgeon: [name], MD;  Location: [place];  Service: Open Heart Surgery;  Laterality: N/A;  . COLONOSCOPY N/A 08/29/2016   Procedure: COLONOSCOPY;  Surgeon: Danie Binder, MD;  [facility];  Service: Endoscopy;  Laterality: N/A;  1030  . COLONOSCOPY N/A 03/09/2018   Procedure: COLONOSCOPY;  Surgeon: Danie Binder, MD;  [facility];  Service: Endoscopy;  Laterality: N/A;  1:15pm  . HERNIA REPAIR     umbilical  . MASS EXCISION N/A 03/18/2018   Procedure: EXCISION CYST ON BACK 2CM;  Surgeon: [name], MD;  Location: [facility];  Service: General;  Laterality: N/A;  . POLYPECTOMY  03/09/2018   Procedure: POLYPECTOMY;  Surgeon: Danie Binder, MD;  [facility];  Service: Endoscopy;;  ascending, transverse x2; descending, sigmoid  . RIGHT/LEFT HEART CATH AND CORONARY ANGIOGRAPHY N/A 09/05/2017   Procedure: RIGHT/LEFT HEART CATH AND CORONARY ANGIOGRAPHY;  Surgeon: Larey Dresser, MD;  Location: [place] [facility];  Service: Cardiovascular;  Laterality: N/A;  .  TEE WITHOUT CARDIOVERSION N/A 07/20/2018   Procedure: TRANSESOPHAGEAL ECHOCARDIOGRAM (TEE);  Surgeon: [name], MD;  Location: [place];  Service: Open Heart Surgery;  Laterality: N/A;     Medications Prior to Admission: Prior to Admission medications   Medication Sig Start Date End Date Taking? Authorizing Provider  aspirin EC 325 MG EC tablet Take 1 tablet (325 mg total) by mouth daily. 07/27/18  Yes Conte, Tessa N, PA-C  docusate sodium (COLACE) 250 MG  capsule Take 250 mg by mouth as needed for constipation.   Yes [provider]  lisinopril (PRINIVIL,ZESTRIL) 20 MG tablet Take 1 tablet (20 mg total) by mouth daily. 07/27/18  Yes Harriet Pho, Tessa N, PA-C  metoprolol tartrate (LOPRESSOR) 25 MG tablet Take 0.5 tablets (12.5 mg total) by mouth 2 (two) times daily. 07/27/18  Yes Conte, Tessa N, PA-C  oxyCODONE (OXY IR/ROXICODONE) 5 MG immediate release tablet Take 1 tablet (5 mg total) by mouth every 6 (six) hours as needed for severe pain. 07/27/18  Yes Conte, Tessa N, PA-C  allopurinol (ZYLOPRIM) 300 MG tablet Take 300 mg by mouth daily as needed (gout flare).     [provider]  calcium carbonate (TUMS - DOSED IN MG ELEMENTAL CALCIUM) 500 MG chewable tablet Chew 1-2 tablets by mouth 2 (two) times daily as needed for indigestion or heartburn (for stomach pain due to spicy foods).     [provider]     Allergies:    Allergies  Allergen Reactions  . Ciprofloxacin   . Morphine And Related Other (See Comments)    Caused patient to pass out.     Social History:   Social History   Socioeconomic History  . Marital status: Widowed    Spouse name: Not on file  . Number of children: Not on file  . Years of education: Not on file  . Highest education level: Not on file  Occupational History  . Not on file  Social Needs  . Financial resource strain: Not on file  . Food insecurity:    Worry: Not on file    Inability: Not on file  . Transportation needs:    Medical: Not on file    Non-medical: Not on file  Tobacco Use  . Smoking status: Former Smoker    Packs/day: 2.50    Years: 25.00    Pack years: 62.50    Types: Cigarettes    Last attempt to quit: 08/01/1981    Years since quitting: 37.0  . Smokeless tobacco: Never Used  Substance and Sexual Activity  . Alcohol use: Yes    Comment: occ  . Drug use: No  . Sexual activity: Yes    Birth control/protection: None  Lifestyle  . Physical activity:    Days per  week: Not on file    Minutes per session: Not on file  . Stress: Not on file  Relationships  . Social connections:    Talks on phone: Not on file    Gets together: Not on file    Attends religious service: Not on file    Active member of club or organization: Not on file    Attends meetings of clubs or organizations: Not on file    Relationship status: Not on file  . Intimate partner violence:    Fear of current or ex partner: Not on file    Emotionally abused: Not on file    Physically abused: Not on file    Forced sexual activity: Not  on file  Other Topics Concern  . Not on file  Social History Narrative  . Not on file    Family History:  The patient's family history includes Colon polyps in his sister; Heart attack in his mother; Stomach cancer in his father. There is no history of Colon cancer.    ROS:  Please see the history of present illness.  All other ROS reviewed and negative.     Physical Exam/Data:   Vitals:   08/04/18 1615 08/04/18 1630 08/04/18 1825 08/04/18 1949  BP: 120/76 118/79 107/72 (!) 107/54  Pulse: 65 65 67 83  Resp: 16 14  18   Temp:   97.9 F (36.6 C) 98.1 F (36.7 C)  TempSrc:   Oral Oral  SpO2: 100% 100% 97% 97%  Weight:      Height:        Intake/Output Summary (Last 24 hours) at 08/04/2018 2005 Last data filed at 08/04/2018 1320 Gross per 24 hour  Intake 500 ml  Output -  Net 500 ml   Filed Weights   08/04/18 1159  Weight: 86.2 kg   Body mass index is 27.26 kg/m.  General:  Well nourished, well developed, in no acute distress HEENT: normal Lymph: no adenopathy Neck: no JVD Endocrine:  No thryomegaly Cardiac:  normal S1, S2; RRR; 2/6 systolic murmur rusb Lungs:  clear to auscultation bilaterally, no wheezing, rhonchi or rales  Abd: soft, nontender, no hepatomegaly  Ext: no edema Musculoskeletal:  No deformities, BUE and BLE strength normal and equal Skin: warm and dry  Neuro:  CNs 2-12 intact, no focal abnormalities  noted Psych:  Normal affect     Laboratory Data:  Chemistry Recent Labs  Lab 08/04/18 1237  NA 140  K 4.0  CL 106  CO2 25  GLUCOSE 169*  BUN 26*  CREATININE 1.05  CALCIUM 9.1  GFRNONAA >60  GFRAA >60  ANIONGAP 9    No results for input(s): PROT, ALBUMIN, AST, ALT, ALKPHOS, BILITOT in the last 168 hours. Hematology Recent Labs  Lab 08/04/18 1237  WBC 7.1  RBC 4.17*  HGB 13.1  HCT 39.6  MCV 95.0  MCH 31.4  MCHC 33.1  RDW 13.3  PLT 526*   Cardiac Enzymes Recent Labs  Lab 08/04/18 1237  TROPONINI 0.03*   No results for input(s): TROPIPOC in the last 168 hours.  BNPNo results for input(s): BNP, PROBNP in the last 168 hours.  DDimer No results for input(s): DDIMER in the last 168 hours.  Radiology/Studies:  Dg Chest Port 1 View  Result Date: 08/04/2018 CLINICAL DATA:  Tachycardia with cardiac palpitations EXAM: PORTABLE CHEST 1 VIEW COMPARISON:  July 25, 2018 FINDINGS: Lungs are clear. Heart is upper normal in size with pulmonary vascularity normal. No adenopathy. Patient is status post median sternotomy. No pneumothorax. No bone lesions. IMPRESSION: Heart upper normal in size.  No edema or consolidation. Electronically Signed   By: Lowella Grip III M.D.   On: 08/04/2018 12:26    Assessment and Plan:   1. Afib with RVR - new diagnosis this admission, recent cardiac surgery 07/21/18 - no specific palpitations, symptosm of severe generalized fatigue - started on dilt gtt. Back in NSR in 70s, complete resolution of symptoms. Appears to have all been related to his arrhythmia - wean dilt drip, start oral lopressor 25mg  po q8hrs with hold parameters - CHADS2Vasc score of 2. Hold on any anticoagulation at this time given pericardial effusion.    2. Pericardial effusion -  likely related to recent cardiac surgery - no evidence of tamponade by imaging - follow with serial imaging, will need repeat in 2-3 days, outpatient if patient is discharged   3.  HTN - hold lisinopril since staring lopressor for rate contorl.      For questions or updates, please contact [place] Please consult [email or web address] for contact info under        Signed, Carlyle Dolly, MD  08/04/2018 8:05 PM

## 2018-08-04 NOTE — Telephone Encounter (Signed)
Mr. Lawrence [name] is s/p Replacement of Ascending Aorta 07/20/18, being discharged on /16/19.  The [facility] for Mr. Lawrence [name] called the office to relay a change in his status.  His orthostatic VS are   120/70/90/50 and his HR is around 130+. He is symptomatic with burning in his chest and shortness of breath.  I advised her to instruct him to go the the nearest ED. She agreed.

## 2018-08-04 NOTE — ED Notes (Signed)
Called to give report to RN, unable to give report.  Stated to this RN that she will call me back.

## 2018-08-04 NOTE — Telephone Encounter (Signed)
[  08/04/2018 10:43 AM]  Reece Levy:   can you talk to [facility] w/ [facility] about Lawrence [name]-- she's at his home right now seeing him and says he's having some cardiac issues, she didn't want me to  take a message [phone]   Juliann Pulse from [facility] called to say patient had chest "burning", diaphoresis , irregular HR at 130, BP 90/52 when standing.I advised her to call 911

## 2018-08-04 NOTE — Progress Notes (Signed)
*  PRELIMINARY RESULTS* Echocardiogram 2D Echocardiogram has been performed.  Lawrence [name] 08/04/2018, 4:17 PM

## 2018-08-04 NOTE — ED Notes (Signed)
Date and time results received: 08/04/18 1414 (use smartphrase ".now" to insert current time)  Test:Troponin Critical Value: 0.03  Name of Provider Notified:Dr Horton Orders Received? Or Actions Taken?: NA

## 2018-08-04 NOTE — Telephone Encounter (Signed)
Please call Juliann Pulse w/ [facility] concerning pt [phone]

## 2018-08-04 NOTE — ED Triage Notes (Signed)
Pt had heart surgery on sept 9th.  Reports had valve replacement.  Home Health came out today and noticed pt was tachycardic and hypotensive.  BP 92/50.  EMS was called and reports pt had afib with rvr.  Reports HR was as high at 220.  EMS administered adenosine x 3,  6mg , 12 mg,  and 12mg .  EMS says adenosine did not help so they adminsitered 22mg  cardizem IV push and HR slowed to 120s and afib.  Pt alert and oriented. Denies any pain or sob

## 2018-08-04 NOTE — ED Provider Notes (Signed)
[facility] EMERGENCY DEPARTMENT Provider Note   CSN: [id number] Arrival date & time: 08/04/18  1155     History   Chief Complaint Chief Complaint  Patient presents with  . Tachycardia    HPI Lawrence [name] is a 67 y.o. male.  HPI  This a 67 year old male with a history of bicuspid aortic valve and thoracic aortic aneurysm status post repair in on July 20, 2018 who presents with tachycardia.  Patient reports that he had been doing well at home after discharge on September 13.  However this past weekend he started feeling ill.  He reports episodes of near syncope and diaphoresis.  He had no chest pain at the time.  No noted palpitations.  The symptoms seem to come and go.  He states that his home health agency came out this morning and he had similar symptoms.  He never passed out.  He reports diaphoresis and "feeling like I was going to pass out."  Denies chest pain.  Does report some shortness of breath.  Patient was found by EMS to have heart rates in the 200s.  Initially thought to be SVT.  Subsequently did not respond to adenosine x3.  He was subsequently given Cardizem by EMS with improvement of his heart rate.  EKG more suggestive of atrial fibrillation with RVR.  No history of atrial fibrillation.  He is not currently on any blood thinners.  Patient denies any recent fevers or illnesses.  He states he feels somewhat better after medications by EMS.  Past Medical History:  Diagnosis Date  . Aortic atherosclerosis ([place])   . Aortic insufficiency   . Aortic stenosis   . Arthritis   . Bicuspid aortic valve    a. mild AS, mild-mod AI by echo 03/2017, 5cm thoracic aortic aneurysm by CT 07/2017.  Marland Kitchen Dyspnea   . Essential hypertension   . GERD (gastroesophageal reflux disease)   . Gout   . Heart murmur   . Thoracic aortic aneurysm ([place])    a. 07/2017: 5cm by CT.    Patient Active Problem List   Diagnosis Date Noted  . 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 Sebaceous cyst 03/06/2018    . 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 Essential hypertension 09/01/2017  . Aortic atherosclerosis ([place])   . Aortic insufficiency   . Aortic stenosis   . Bicuspid aortic valve   . Thoracic aortic aneurysm ([place])   . History of colonic polyps 08/01/2016    Past Surgical History:  Procedure Laterality Date  . ASCENDING AORTIC ROOT REPLACEMENT N/A 07/20/2018   Procedure: REPLACMENT OF ASCENDING AORTA WITH 32MM GRAFT. HYPOTHERMIC CIRCULATORY ARREST. WHEAT PROCEDURE.;  Surgeon: [name], MD;  Location: [place];  Service: Open Heart Surgery;  Laterality: N/A;  . COLONOSCOPY N/A 08/29/2016   Procedure: COLONOSCOPY;  Surgeon: Danie Binder, MD;  [facility];  Service: Endoscopy;  Laterality: N/A;  1030  . COLONOSCOPY N/A 03/09/2018   Procedure: COLONOSCOPY;  Surgeon: Danie Binder, MD;  [facility];  Service: Endoscopy;  Laterality: N/A;  1:15pm  . HERNIA REPAIR     umbilical  . MASS EXCISION N/A 03/18/2018   Procedure: EXCISION CYST ON BACK 2CM;  Surgeon: [name], MD;  Location: [facility];  Service: General;  Laterality: N/A;  . POLYPECTOMY  03/09/2018   Procedure: POLYPECTOMY;  Surgeon: Danie Binder, MD;  [facility];  Service: Endoscopy;;  ascending, transverse x2; descending, sigmoid  .  RIGHT/LEFT HEART CATH AND CORONARY ANGIOGRAPHY N/A 09/05/2017   Procedure: RIGHT/LEFT HEART CATH AND CORONARY ANGIOGRAPHY;  Surgeon: Larey Dresser, MD;  Location: [place] [facility];  Service: Cardiovascular;  Laterality: N/A;  . TEE WITHOUT CARDIOVERSION N/A 07/20/2018   Procedure: TRANSESOPHAGEAL ECHOCARDIOGRAM (TEE);  Surgeon: [name], MD;  Location: [place];  Service: Open Heart Surgery;  Laterality: N/A;        Home Medications    Prior to Admission medications   Medication Sig Start Date End Date Taking? Authorizing Provider  aspirin EC 325 MG EC tablet Take 1 tablet (325 mg total) by mouth daily. 07/27/18  Yes Conte, Tessa N,  PA-C  docusate sodium (COLACE) 250 MG capsule Take 250 mg by mouth as needed for constipation.   Yes [provider]  lisinopril (PRINIVIL,ZESTRIL) 20 MG tablet Take 1 tablet (20 mg total) by mouth daily. 07/27/18  Yes Harriet Pho, Tessa N, PA-C  metoprolol tartrate (LOPRESSOR) 25 MG tablet Take 0.5 tablets (12.5 mg total) by mouth 2 (two) times daily. 07/27/18  Yes Conte, Tessa N, PA-C  oxyCODONE (OXY IR/ROXICODONE) 5 MG immediate release tablet Take 1 tablet (5 mg total) by mouth every 6 (six) hours as needed for severe pain. 07/27/18  Yes Conte, Tessa N, PA-C  allopurinol (ZYLOPRIM) 300 MG tablet Take 300 mg by mouth daily as needed (gout flare).     [provider]  calcium carbonate (TUMS - DOSED IN MG ELEMENTAL CALCIUM) 500 MG chewable tablet Chew 1-2 tablets by mouth 2 (two) times daily as needed for indigestion or heartburn (for stomach pain due to spicy foods).     [provider]    Family History Family History  Problem Relation Age of Onset  . Colon polyps Sister   . Heart attack Mother   . Stomach cancer Father   . Colon cancer Neg Hx     Social History Social History   Tobacco Use  . Smoking status: Former Smoker    Packs/day: 2.50    Years: 25.00    Pack years: 62.50    Types: Cigarettes    Last attempt to quit: 08/01/1981    Years since quitting: 37.0  . Smokeless tobacco: Never Used  Substance Use Topics  . Alcohol use: Yes    Comment: occ  . Drug use: No     Allergies   Ciprofloxacin and Morphine and related   Review of Systems Review of Systems  Constitutional: Positive for diaphoresis. Negative for fever.  Respiratory: Positive for shortness of breath. Negative for chest tightness and wheezing.   Cardiovascular: Negative for chest pain, palpitations and leg swelling.  Gastrointestinal: Negative for abdominal pain, nausea and vomiting.  Genitourinary: Negative for dysuria.  Musculoskeletal: Negative for back pain.  Neurological:  Positive for dizziness and light-headedness. Negative for seizures and syncope.  All other systems reviewed and are negative.    Physical Exam Updated Vital Signs BP 103/72   Pulse 93   Temp 98.1 F (36.7 C) (Oral)   Resp 19   Ht 1.778 m (5\' 10" )   Wt 86.2 kg   SpO2 97%   BMI 27.26 kg/m   Physical Exam  Constitutional: He is oriented to person, place, and time. He appears well-developed and well-nourished. No distress.  HENT:  Head: Normocephalic and atraumatic.  Eyes: Pupils are equal, round, and reactive to light.  Neck: Neck supple.  Cardiovascular: Normal heart sounds.  No murmur heard. Tachycardia, irregular rhythm, well-healing midline sternotomy incisions,  incisions clean dry and intact without erythema or drainage  Pulmonary/Chest: Effort normal and breath sounds normal. No respiratory distress. He has no wheezes.  Abdominal: Soft. Bowel sounds are normal. There is no tenderness. There is no rebound.  Musculoskeletal: He exhibits no edema or deformity.  Lymphadenopathy:    He has no cervical adenopathy.  Neurological: He is alert and oriented to person, place, and time.  Skin: Skin is warm and dry.  Psychiatric: He has a normal mood and affect.  Nursing note and vitals reviewed.    ED Treatments / Results  Labs (all labs ordered are listed, but only abnormal results are displayed) Labs Reviewed  BASIC METABOLIC PANEL - Abnormal; Notable for the following components:      Result Value   Glucose, Bld 169 (*)    BUN 26 (*)    All other components within normal limits  CBC - Abnormal; Notable for the following components:   RBC 4.17 (*)    Platelets 526 (*)    All other components within normal limits  TROPONIN I - Abnormal; Notable for the following components:   Troponin I 0.03 (*)    All other components within normal limits  MAGNESIUM    EKG EKG Interpretation  Date/Time:  Tuesday August 04 2018 12:02:06 EDT Ventricular Rate:  130 PR  Interval:    QRS Duration: 97 QT Interval:  326 QTC Calculation: 465 R Axis:   -78 Text Interpretation:  Atrial fibrillation Left anterior fascicular block Nonspecific T wave abnormality Confirmed by Lajean Saver [id number]) on 08/04/2018 12:16:38 PM   Radiology Dg Chest Port 1 View  Result Date: 08/04/2018 CLINICAL DATA:  Tachycardia with cardiac palpitations EXAM: PORTABLE CHEST 1 VIEW COMPARISON:  July 25, 2018 FINDINGS: Lungs are clear. Heart is upper normal in size with pulmonary vascularity normal. No adenopathy. Patient is status post median sternotomy. No pneumothorax. No bone lesions. IMPRESSION: Heart upper normal in size.  No edema or consolidation. Electronically Signed   By: Lowella Grip III M.D.   On: 08/04/2018 12:26    Procedures Procedures (including critical care time)  CRITICAL CARE Performed by: Merryl Hacker   Total critical care time: 60 minutes  Critical care time was exclusive of separately billable procedures and treating other patients.  Critical care was necessary to treat or prevent imminent or life-threatening deterioration.  Critical care was time spent personally by me on the following activities: development of treatment plan with patient and/or surrogate as well as nursing, discussions with consultants, evaluation of patient's response to treatment, examination of patient, obtaining history from patient or surrogate, ordering and performing treatments and interventions, ordering and review of laboratory studies, ordering and review of radiographic studies, pulse oximetry and re-evaluation of patient's condition.   Medications Ordered in ED Medications  diltiazem (CARDIZEM) 1 mg/mL load via infusion 10 mg (10 mg Intravenous Bolus from Bag 08/04/18 1211)    And  diltiazem (CARDIZEM) 100 mg in dextrose 5% 151mL (1 mg/mL) infusion (12.5 mg/hr Intravenous Rate/Dose Change 08/04/18 1321)  dilTIAZem HCl-Dextrose 100-5 MG/100ML-% infusion SOLN (  Intravenous Not Given 08/04/18 1259)  sodium chloride 0.9 % bolus 500 mL (0 mLs Intravenous Stopped 08/04/18 1320)     Initial Impression / Assessment and Plan / ED Course  I have reviewed the triage vital signs and the nursing notes.  Pertinent labs & imaging results that were available during my care of the patient were reviewed by me and considered in my medical decision making (see  chart for details).  Clinical Course as of Aug 05 1455  Tue Aug 04, 2018  1443 Discussed patient with Dr. Jerilee Hoh.  Request cardiothoracic input.  Cardiothoracic consulted.  Will update hospitalist.   [CH]  1456 Per Gerhardt's office, recommend stat echocardiogram and cardiology consult.  Anticoagulation okay if no pericardial effusion.   [CH]    Clinical Course User Index [CH] Hamp Moreland, Barbette Hair, MD    Patient presents with near syncope and diaphoresis.  Found to be in atrial fibrillation with RVR.  Current heart rates in the 150s.  No history.  He is not on anticoagulation.  Recently with aortic graft.  Patient was started on a diltiazem drip.  Heart rates improved on multiple repeat evaluations from 110-1 20.  Patient states he feels well.  Marginally elevated troponin at 0.03.  Suspect this is rate related.  Clinical course above.  Echocardiogram ordered per cardiothoracic surgery.  Anticipate admission and possible transfer to [facility] where specialists are available if needed.  This was discussed with the admitting hospitalist.  This patients CHA2DS2-VASc Score and unadjusted Ischemic Stroke Rate (% per year) is equal to 3.2 % stroke rate/year from a score of 3  Above score calculated as 1 point each if present [CHF, HTN, DM, Vascular=MI/PAD/Aortic Plaque, Age if 65-74, or Male] Above score calculated as 2 points each if present [Age > 75, or Stroke/TIA/TE]   Final Clinical Impressions(s) / ED Diagnoses   Final diagnoses:  Atrial fibrillation with RVR [facility])    ED Discharge Orders          Ordered    Amb referral to AFIB Clinic     08/04/18 1209           Merryl Hacker, MD 08/04/18 1459

## 2018-08-05 ENCOUNTER — Other Ambulatory Visit: Payer: Self-pay

## 2018-08-05 ENCOUNTER — Other Ambulatory Visit: Payer: Self-pay | Admitting: Cardiology

## 2018-08-05 ENCOUNTER — Encounter (HOSPITAL_COMMUNITY): Payer: Self-pay | Admitting: General Practice

## 2018-08-05 DIAGNOSIS — Z952 Presence of prosthetic heart valve: Secondary | ICD-10-CM | POA: Diagnosis not present

## 2018-08-05 DIAGNOSIS — I480 Paroxysmal atrial fibrillation: Secondary | ICD-10-CM | POA: Diagnosis not present

## 2018-08-05 DIAGNOSIS — I712 Thoracic aortic aneurysm, without rupture: Secondary | ICD-10-CM | POA: Diagnosis not present

## 2018-08-05 DIAGNOSIS — I10 Essential (primary) hypertension: Secondary | ICD-10-CM | POA: Diagnosis not present

## 2018-08-05 DIAGNOSIS — Z87891 Personal history of nicotine dependence: Secondary | ICD-10-CM | POA: Diagnosis not present

## 2018-08-05 DIAGNOSIS — K219 Gastro-esophageal reflux disease without esophagitis: Secondary | ICD-10-CM | POA: Diagnosis not present

## 2018-08-05 DIAGNOSIS — I313 Pericardial effusion (noninflammatory): Secondary | ICD-10-CM | POA: Diagnosis not present

## 2018-08-05 DIAGNOSIS — M109 Gout, unspecified: Secondary | ICD-10-CM | POA: Diagnosis not present

## 2018-08-05 DIAGNOSIS — I4891 Unspecified atrial fibrillation: Secondary | ICD-10-CM | POA: Diagnosis not present

## 2018-08-05 DIAGNOSIS — I700 Atherosclerosis of aorta: Secondary | ICD-10-CM | POA: Diagnosis not present

## 2018-08-05 DIAGNOSIS — Q231 Congenital insufficiency of aortic valve: Secondary | ICD-10-CM

## 2018-08-05 LAB — COMPREHENSIVE METABOLIC PANEL
ALBUMIN: 3.2 g/dL — AB (ref 3.5–5.0)
ALT: 71 U/L — AB (ref 0–44)
AST: 36 U/L (ref 15–41)
Alkaline Phosphatase: 87 U/L (ref 38–126)
Anion gap: 8 (ref 5–15)
BILIRUBIN TOTAL: 0.8 mg/dL (ref 0.3–1.2)
BUN: 18 mg/dL (ref 8–23)
CHLORIDE: 105 mmol/L (ref 98–111)
CO2: 24 mmol/L (ref 22–32)
Calcium: 8.8 mg/dL — ABNORMAL LOW (ref 8.9–10.3)
Creatinine, Ser: 0.98 mg/dL (ref 0.61–1.24)
GFR calc Af Amer: >60 mL/min (ref >60)
GFR calc non Af Amer: >60 mL/min (ref >60)
Glucose, Bld: 117 mg/dL — ABNORMAL HIGH (ref 70–99)
POTASSIUM: 4.3 mmol/L (ref 3.5–5.1)
Sodium: 137 mmol/L (ref 135–145)
TOTAL PROTEIN: 6 g/dL — AB (ref 6.5–8.1)

## 2018-08-05 LAB — TSH: TSH: 0.97 u[IU]/mL (ref 0.350–4.500)

## 2018-08-05 LAB — MAGNESIUM: MAGNESIUM: 1.9 mg/dL (ref 1.7–2.4)

## 2018-08-05 MED ORDER — METOPROLOL TARTRATE 25 MG PO TABS: 25.0000 mg | ORAL_TABLET | Freq: Two times a day (BID) | ORAL | 4 refills | Status: AC

## 2018-08-05 MED ORDER — DILTIAZEM HCL ER COATED BEADS 120 MG PO CP24: 120.0000 mg | ORAL_CAPSULE | Freq: Every day | ORAL | 4 refills | Status: AC

## 2018-08-05 MED ORDER — DILTIAZEM HCL 30 MG PO TABS: 30.0000 mg | ORAL_TABLET | Freq: Four times a day (QID) | ORAL | 11 refills | Status: DC | PRN

## 2018-08-05 MED ORDER — DILTIAZEM HCL ER COATED BEADS 120 MG PO CP24
120.0000 mg | ORAL_CAPSULE | Freq: Every day | ORAL | Status: DC
  Administered 2018-08-05: 120 mg via ORAL
  Filled 2018-08-05: qty 1

## 2018-08-05 NOTE — Progress Notes (Signed)
      [address]       [place] [address]             [phone]         Subjective: He is feeling well this morning. He is not short of breath and is feeling much better now that his heart is in rhythm.   Objective: Vital signs in last 24 hours: Temp:  [97.9 F (36.6 C)-98.1 F (36.7 C)] 98 F (36.7 C) (09/25 0648) Pulse Rate:  [57-159] 68 (09/25 0648) Cardiac Rhythm: Normal sinus rhythm (09/25 0700) Resp:  [14-25] 25 (09/25 0648) BP: (103-124)/(54-102) 112/81 (09/25 0648) SpO2:  [93 %-100 %] 100 % (09/25 0648) Weight:  [86.2 kg-87.2 kg] 87.2 kg (09/25 0648)     Intake/Output from previous day: 09/24 0701 - 09/25 0700 In: 196 [I.V.:121; IV Piggyback:500] Out: 600 [Urine:600] Intake/Output this shift: No intake/output data recorded.  General appearance: alert, cooperative and no distress Heart: regular rate and rhythm, S1, S2 normal, no murmur, click, rub or gallop Lungs: clear to auscultation bilaterally Abdomen: soft, non-tender; bowel sounds normal; no masses,  no organomegaly Extremities: extremities normal, atraumatic, no cyanosis or edema Wound: clean and dry sternotomy incision  Lab Results: Recent Labs    08/04/18 1237  WBC 7.1  HGB 13.1  HCT 39.6  PLT 526*   BMET:  Recent Labs    08/04/18 1237 08/05/18 0427  NA 140 137  K 4.0 4.3  CL 106 105  CO2 25 24  GLUCOSE 169* 117*  BUN 26* 18  CREATININE 1.05 0.98  CALCIUM 9.1 8.8*    PT/INR: No results for input(s): LABPROT, INR in the last 72 hours. ABG    Component Value Date/Time   PHART 7.339 (L) 07/21/2018 0000   HCO3 21.3 07/21/2018 0000   TCO2 23 07/21/2018 1555   ACIDBASEDEF 4.0 (H) 07/21/2018 0000   O2SAT 97.0 07/21/2018 0000   CBG (last 3)  No results for input(s): GLUCAP in the last 72 hours.  Assessment/Plan: S/P Replacement of ascending aorta with a 25mm graft on 07/21/2018.  1. CV-Now in NSR in the 70s. HR was in the 200s atrial fibrillation with RVR. EMS  gave Adenosine x 3 without conversion then IV Cardizem. He is currently on Cardizem gtt. Continue oral Metoprolol. BP stable. Holding lisinopril.  2. Pulm-tolerating room air without issues. Shortness of breath has resolved.  3. Renal-creatinine 0.98, electrolytes okay.  4. Pericardial effusion-Echocardiogram done yesterday which showed a moderate sized pericardial effusion without tamponade physiology.   Plan: Cardiology plans to repeat an Echocardiogram in 2-3 days to monitor pericardial effusion. Patient is stable and now in normal sinus rhythm. Will continue to follow.    LOS: 1 day    Elgie Collard 08/05/2018

## 2018-08-05 NOTE — Progress Notes (Addendum)
Progress Note  Patient Name: Lawrence [name] Date of Encounter: 08/05/2018  Primary Cardiologist: Dr. [name], MD  Subjective   Pt feeling well this AM. Remains in NSR. Denies chest pain or palpitations.   Inpatient Medications    Scheduled Meds: . aspirin  325 mg Oral Daily  . heparin  5,000 Units Subcutaneous Q8H  . metoprolol tartrate  25 mg Oral Q8H   Continuous Infusions: . diltiazem (CARDIZEM) infusion Stopped (08/05/18 0016)   PRN Meds: acetaminophen, ondansetron (ZOFRAN) IV   Vital Signs    Vitals:   08/04/18 1825 08/04/18 1949 08/04/18 2254 08/05/18 0648  BP: 107/72 (!) 107/54 106/73 112/81  Pulse: 67 83 65 68  Resp:  18  (!) 25  Temp: 97.9 F (36.6 C) 98.1 F (36.7 C)  98 F (36.7 C)  TempSrc: Oral Oral  Oral  SpO2: 97% 97%  100%  Weight:    87.2 kg  Height:        Intake/Output Summary (Last 24 hours) at 08/05/2018 0734 Last data filed at 08/05/2018 0649 Gross per 24 hour  Intake 620.98 ml  Output 600 ml  Net 20.98 ml   Filed Weights   08/04/18 1159 08/05/18 0648  Weight: 86.2 kg 87.2 kg    Physical Exam   General: Well developed, well nourished, NAD Skin: Warm, dry, intact. Mid-chest, approximated surgical scar without signs of infection  Head: Normocephalic, atraumatic, clear, moist mucus membranes. Neck: Negative for carotid bruits. No JVD Lungs:Clear to ausculation bilaterally. No wheezes, rales, or rhonchi. Breathing is unlabored. Cardiovascular: RRR with S1 S2. No murmurs, rubs, gallops, or LV heave appreciated. Abdomen: Soft, non-tender, non-distended with normoactive bowel sounds. No obvious abdominal masses. MSK: Strength and tone appear normal for age. 5/5 in all extremities Extremities: No edema. No clubbing or cyanosis. DP/PT pulses 2+ bilaterally Neuro: Alert and oriented. No focal deficits. No facial asymmetry. MAE spontaneously. Psych: Responds to questions appropriately with normal affect.    Labs      Chemistry Recent Labs  Lab 08/04/18 1237 08/05/18 0427  NA 140 137  K 4.0 4.3  CL 106 105  CO2 25 24  GLUCOSE 169* 117*  BUN 26* 18  CREATININE 1.05 0.98  CALCIUM 9.1 8.8*  PROT  --  6.0*  ALBUMIN  --  3.2*  AST  --  36  ALT  --  71*  ALKPHOS  --  87  BILITOT  --  0.8  GFRNONAA >60 >60  GFRAA >60 >60  ANIONGAP 9 8     Hematology Recent Labs  Lab 08/04/18 1237  WBC 7.1  RBC 4.17*  HGB 13.1  HCT 39.6  MCV 95.0  MCH 31.4  MCHC 33.1  RDW 13.3  PLT 526*    Cardiac Enzymes Recent Labs  Lab 08/04/18 1237  TROPONINI 0.03*   No results for input(s): TROPIPOC in the last 168 hours.   BNPNo results for input(s): BNP, PROBNP in the last 168 hours.   DDimer No results for input(s): DDIMER in the last 168 hours.   Radiology    Dg Chest Port 1 View  Result Date: 08/04/2018 CLINICAL DATA:  Tachycardia with cardiac palpitations EXAM: PORTABLE CHEST 1 VIEW COMPARISON:  July 25, 2018 FINDINGS: Lungs are clear. Heart is upper normal in size with pulmonary vascularity normal. No adenopathy. Patient is status post median sternotomy. No pneumothorax. No bone lesions. IMPRESSION: Heart upper normal in size.  No edema or consolidation. Electronically Signed  By: Lowella Grip III M.D.   On: 08/04/2018 12:26   Telemetry    08/05/18 NSR HR 70's - Personally Reviewed  ECG    No new tracing as of 08/05/18 - Personally Reviewed  Cardiac Studies   Echocardiogram 08/04/2018: Study Conclusions  - Left ventricle: The cavity size was normal. Wall thickness was   increased in a pattern of mild LVH. Systolic function was normal.   The estimated ejection fraction was in the range of 55% to 60%.   Wall motion was normal; there were no regional wall motion   abnormalities. Features are consistent with a pseudonormal left   ventricular filling pattern, with concomitant abnormal relaxation   and increased filling pressure (grade 2 diastolic dysfunction).   Doppler  parameters are consistent with indeterminate ventricular   filling pressure. - Aortic valve: Bicuspid. There was trivial regurgitation. - Aorta: S/p aortic root replacement with 32 mm Hemashield graft   (as per report). - Right ventricle: Systolic function was reduced. - Inferior vena cava: The vessel was dilated. The respirophasic   diameter changes were blunted (< 50%), consistent with elevated   central venous pressure. Estimated CVP 15 mmHg. - Pericardium, extracardiac: A moderate pericardial effusion is   seen. Overall findings not consistent with tamponade physiology.  Patient Profile     67 y.o. male with a history of HTN and recent bicuspid, aortic valve and aortic aneurysm s/p supracoronary replacement of ascending aorta with 32 mm Hemashield graft 07/21/2018 who presented to [facility] on 08/04/18 with tachycardia and presyncope. Per ER notes EMS called, patient evaluated and heart rates in 200s. Given adenosine x 3 without resolution, then given IV cardizem. In ER found to be in afib with RVR, started on dilt gtt.   Assessment & Plan    1.  New Atrial fibrillation with RVR: -Recent bicuspid, aortic valve and aortic aneurysm replacement with 32 mm Hemashield graft on 07/21/2018 who [facility] to [facility]/04/18 with presyncopal tachycardia found to be in atrial fibrillation with RVR -Diltiazem gtt initiated upon arrival>>> converted to NSR with rate control in the 60s to 70s with complete symptom resolution -Transitioned to metoprolol 25 mg PO q8H greater rate control>>remians in NSR HR 70's -No anticoagulation secondary to pericardial effusion>>>see plan below  -CHA2DS2VASc =2  2.  Pericardial effusion: -Likely in the setting of recent cardiac surgery 07/21/2018 with no evidence of tamponade per echocardiogram on 08/04/2017 -Per note, repeat echocardiogram in 2-3 days inpatient or as outpatient if discharged  3.  HTN: -Stable, 112/81, 106/73, 107/54, 107/72 -Lisinopril on hold secondary to  initiation of Lopressor for greater rate control   Signed, Kathyrn Drown NP-C [facility] Pager: [phone] 08/05/2018, 7:34 AM     For questions or updates, please contact   Please consult [email or web address] for contact info under Cardiology/STEMI.  Personally seen and examined. Agree with above.  Feels well, sitting up in bed, no symptoms, no shortness of breath.  Physical exam noteworthy for surgical scar mid chest, well-healing heart regular rate and rhythm, lungs are clear, no edema, alert  Lab work personally reviewed.  No significant abnormalities. EKG-previous atrial fibrillation, currently on telemetry sinus rhythm.  Paroxysmal atrial fibrillation postoperatively -Converted with IV diltiazem drip.  We will go ahead and give him p.o. diltiazem CD 120 mg once a day to hopefully ward off further episodes of atrial fibrillation.  We will also give him diltiazem 30 mg p.o. every 6 hours as needed if he begins to feel this at home.  I  would continue with metoprolol 25 mill grams twice a day. if atrial fibrillation becomes a more recurrent issue, we may need to utilize amiodarone short-term given his postoperative status.  Fairly rapid ventricular conduction, approaching 200 bpm.  Continue to hold lisinopril.  No anticoagulation.  Continue to monitor pericardial effusion as outpatient, hypertension.  Okay for discharge.  Close follow-up with cardiology and cardiothoracic surgery.  Candee Furbish, MD

## 2018-08-05 NOTE — Discharge Summary (Addendum)
Discharge Summary    Patient ID: Lawrence [name] MRN: [id number]; DOB: 06-08-1951  Admit date: 08/04/2018 Discharge date: 08/05/2018  Primary Care Provider: [name], MD  Primary Cardiologist: [name], MD   Discharge Diagnoses    Principal Problem:   Atrial fibrillation with [facility]) Active Problems:   Bicuspid aortic valve   Thoracic aortic aneurysm [facility])   Essential hypertension   Aortic root aneurysm ([place])   S/P aorta repair   Atrial fibrillation (HCC)  Allergies Allergies  Allergen Reactions  . Ciprofloxacin   . Morphine And Related Other (See Comments)    Caused patient to pass out.    Diagnostic Studies/Procedures    Echocardiogram 08/04/2018: Study Conclusions  - Left ventricle: The cavity size was normal. Wall thickness was increased in a pattern of mild LVH. Systolic function was normal. The estimated ejection fraction was in the range of 55% to 60%. Wall motion was normal; there were no regional wall motion abnormalities. Features are consistent with a pseudonormal left ventricular filling pattern, with concomitant abnormal relaxation and increased filling pressure (grade 2 diastolic dysfunction). Doppler parameters are consistent with indeterminate ventricular filling pressure. - Aortic valve: Bicuspid. There was trivial regurgitation. - Aorta: S/p aortic root replacement with 32 mm Hemashield graft (as per report). - Right ventricle: Systolic function was reduced. - Inferior vena cava: The vessel was dilated. The respirophasic diameter changes were blunted (<50%), consistent with elevated central venous pressure. Estimated CVP 15 mmHg. - Pericardium, extracardiac: A moderate pericardial effusion is seen. Overall findings not consistent with tamponade physiology.  History of Present Illness     67 y.o. male with a history of HTN and recent bicuspid, aortic valve and aortic aneurysm s/p supracoronary replacement of  ascending aorta with 32 mmHemashield graft9/08/2018 who presented to [facility] on 08/04/18 with tachycardia and presyncopal symptoms. He reports feeling well initially after recent discharge from surgery. He received his flu shot late last week and began feeling very fatigued and drained over the weekend which he attributed to the flu shot. His symptoms progressed to more fatigue, then to dizziness to the point of presyncope with activities on Tuesday. He called EMS for transport to [facility] for further evaluation. Upon EMS arrival, his heart rates were noted to be in the 200bpm range. He was given adenosine x 3 without resolution, then IV Cardizem was started.   Hospital Course     Upon arrival to the ED, EKG revealed atrial fibrillation with rapid ventricular response. By the time Cardiology was consulted in the ED, pt had converted spontaneously on his own to NSR with rate control.  Electrolytes noted to be within normal range. Troponin negative at 0.03 with no chest pain symptoms. CXR with no acute cardiopulmonary process. Echocardiogram performed 08/04/2018 with LVEF of 55 to 60% with no wall motion abnormalities and grade 2 diastolic dysfunction.  He had dilated IVC and moderate pericardial effusion not consistent with tamponade.  On day of discharge patient without complaints of chest pain, palpitations or shortness of breath.  Ambulated in the hallway without complaints.   Medication plan: Continue to hold lisinopril, start metoprolol 25 mg twice daily for rate control.  We will start diltiazem CD 120 mg once daily.  We will give prescription for diltiazem 30 mg PO every 6 hours PRN if symptoms recur.  No anticoagulation given pericardial effusion and low Chadsvasc score of 2.    Other hospital problems include: 1. Pericardial effusion likely in the setting of recent cardiac surgery  with no evidence of tamponade by imaging -Will need follow-up echocardiogram in approximately 1 week  2.   Hypertension: -Continue to hold lisinopril -Patient started on metoprolol 25 mg twice daily for rate control  3.  Recent bicuspid, aortic valve and aortic aneurysm s/p supracoronary replacement of ascending aorta with 32 mmHemashield graft9/08/2018: -Stable with no acute concerns -We will need to follow pericardial effusion with echocardiogram in approximately 1 week  Consultants: None  Patient was seen and examined by Dr. Marlou Porch who feels that he is stable and ready for discharge on 08/05/2018.  Patient has close follow-up with Dr. Domenic Polite on 08/13/2018 at 8:40 AM.  Will schedule for echocardiogram at that time. _____________  Discharge Vitals Blood pressure 112/81, pulse 68, temperature 98 F (36.7 C), temperature source Oral, resp. rate (!) 25, height 5\' 10"  (1.778 m), weight 87.2 kg, SpO2 100 %.  Filed Weights   08/04/18 1159 08/05/18 0648  Weight: 86.2 kg 87.2 kg   Labs & Radiologic Studies    CBC Recent Labs    08/04/18 1237  WBC 7.1  HGB 13.1  HCT 39.6  MCV 95.0  PLT 694*   Basic Metabolic Panel Recent Labs    08/04/18 1237 08/05/18 0427  NA 140 137  K 4.0 4.3  CL 106 105  CO2 25 24  GLUCOSE 169* 117*  BUN 26* 18  CREATININE 1.05 0.98  CALCIUM 9.1 8.8*  MG 2.1 1.9   Liver Function Tests Recent Labs    08/05/18 0427  AST 36  ALT 71*  ALKPHOS 87  BILITOT 0.8  PROT 6.0*  ALBUMIN 3.2*   Cardiac Enzymes Recent Labs    08/04/18 1237  TROPONINI 0.03*   Thyroid Function Tests Recent Labs    08/05/18 0427  TSH 0.970   _____________  Dg Chest 2 View  Result Date: 07/25/2018 CLINICAL DATA:  Ascending aortic replacement.  Postop evaluation EXAM: CHEST - 2 VIEW COMPARISON:  Three days ago FINDINGS: Stable cardiopericardial size. Atelectasis or trace right pleural effusion. No pneumothorax. Atelectasis at the left base is improved. IMPRESSION: Improving lung volumes and atelectasis.  No visible pneumothorax. Electronically Signed   By: Monte Fantasia M.D.   On: 07/25/2018 09:29   Dg Chest 2 View  Result Date: 07/17/2018 CLINICAL DATA:  Preoperative for Bentall procedure. EXAM: CHEST - 2 VIEW COMPARISON:  02/24/2018 chest CT FINDINGS: Normal heart size. Stable tortuosity of thoracic aorta. Otherwise normal mediastinal contour. No pneumothorax. No pleural effusion. Lungs appear clear, with no acute consolidative airspace disease and no pulmonary edema. Mild eventration of the left greater than right hemidiaphragms. IMPRESSION: No active cardiopulmonary disease. Electronically Signed   By: Ilona Sorrel M.D.   On: 07/17/2018 08:15   Dg Chest Port 1 View  Result Date: 08/04/2018 CLINICAL DATA:  Tachycardia with cardiac palpitations EXAM: PORTABLE CHEST 1 VIEW COMPARISON:  July 25, 2018 FINDINGS: Lungs are clear. Heart is upper normal in size with pulmonary vascularity normal. No adenopathy. Patient is status post median sternotomy. No pneumothorax. No bone lesions. IMPRESSION: Heart upper normal in size.  No edema or consolidation. Electronically Signed   By: Lowella Grip III M.D.   On: 08/04/2018 12:26   Dg Chest Port 1 View  Result Date: 07/22/2018 CLINICAL DATA:  Chest pain. Recent ascending thoracic aortic graft placement EXAM: PORTABLE CHEST 1 VIEW COMPARISON:  July 21, 2018 FINDINGS: Swan-Ganz catheter is been removed. Cordis tip is in the superior vena cava. No pneumothorax. There is atelectatic change  in the left base. A lesser degree of atelectasis is noted in the right base. Lungs elsewhere clear. Heart is upper normal in size with pulmonary vascularity normal. Aorta is prominent but stable. No evident adenopathy. No bone lesions. IMPRESSION: Cordis tip in superior vena cava. No pneumothorax. Atelectasis in the bases, more on the left than on the right. Lungs elsewhere clear. Stable cardiac silhouette. Electronically Signed   By: Lowella Grip III M.D.   On: 07/22/2018 07:46   Dg Chest Port 1 View  Result Date:  07/21/2018 CLINICAL DATA:  Status post aortic repair. EXAM: PORTABLE CHEST 1 VIEW COMPARISON:  Radiograph of July 20, 2018. FINDINGS: Stable cardiomediastinal silhouette. Endotracheal and nasogastric tubes have been removed. Right internal jugular catheter is noted with tip in expected position of main pulmonary artery. Hypoinflation of the lungs is noted with mild bibasilar subsegmental atelectasis. Right-sided chest tube has been removed. No pneumothorax is noted. Bony thorax is unremarkable. IMPRESSION: Right-sided chest tube has been removed without pneumothorax. Hypoinflation of the lungs with mild bibasilar subsegmental atelectasis. Endotracheal and nasogastric tubes have been removed. Electronically Signed   By: Marijo Conception, M.D.   On: 07/21/2018 09:26   Dg Chest Port 1 View  Result Date: 07/20/2018 CLINICAL DATA:  Status post ascending aortic aneurysm repair with graft placement graft placement. EXAM: PORTABLE CHEST 1 VIEW COMPARISON:  PA and lateral chest x-ray of July 16, 2018 FINDINGS: The lungs are borderline hypoinflated. There is infiltrate or atelectasis at the left lung base with small effusion. The heart is normal in size. The mediastinum is mildly widened. The endotracheal tube tip projects 7.9 cm above the carina. The esophagogastric tube tip has its proximal port in the mid esophagus with the tip at the level of the distal esophagus. The Swan-Ganz catheter tip projects in the proximal main pulmonary outflow tract. IMPRESSION: Status post ascending aortic graft placement for aneurysm. No overt CHF. Left lower lobe atelectasis or pneumonia with small left pleural effusion. The support tubes are in reasonable position with exception of the esophagogastric tube which should be advanced by approximately 20 cm. Electronically Signed   By: David  Martinique M.D.   On: 07/20/2018 15:04   Disposition   Pt is being discharged home today in good condition.  Follow-up Plans & Appointments     Follow-up Information    Satira Sark, MD Follow up on 08/13/2018.   Specialty:  Cardiology Why:  Your follow-up appointment with Dr. Domenic Polite is on 08/13/2018 at 8:40 AM.  He will need a follow-up echocardiogram which we scheduled next week.  The office will contact you for date and time. Contact information: [place] [address] [phone]          Discharge Instructions    Amb referral to [facility]   Complete by:  As directed    Call MD for:  difficulty breathing, headache or visual disturbances   Complete by:  As directed    Call MD for:  extreme fatigue   Complete by:  As directed    Call MD for:  persistant dizziness or light-headedness   Complete by:  As directed    Call MD for:  persistant nausea and vomiting   Complete by:  As directed    Call MD for:  severe uncontrolled pain   Complete by:  As directed    Diet - low sodium heart healthy   Complete by:  As directed    Discharge instructions  Complete by:  As directed    You will need to have an echocardiogram repeated at Dr. Myles Gip office.  They will call you with the date and time of this procedure. Expect that this will be in the next week.  If you have not heard from them in the next several days please call the office. Please keep your follow-up appointment with Dr. Domenic Polite on 08/13/2018 at 8:40 AM.  If you have further symptoms of presyncope, dizziness, extreme fatigue or shortness of breath please call the office or report to the emergency department for further evaluation.   Increase activity slowly   Complete by:  As directed      Discharge Medications   Allergies as of 08/05/2018      Reactions   Ciprofloxacin    Morphine And Related Other (See Comments)   Caused patient to pass out.       Medication List    STOP taking these medications   lisinopril 20 MG tablet Commonly known as:  PRINIVIL,ZESTRIL     TAKE these medications   allopurinol 300 MG  tablet Commonly known as:  ZYLOPRIM Take 300 mg by mouth daily as needed (gout flare).   aspirin 325 MG EC tablet Take 1 tablet (325 mg total) by mouth daily.   calcium carbonate 500 MG chewable tablet Commonly known as:  TUMS - dosed in mg elemental calcium Chew 1-2 tablets by mouth 2 (two) times daily as needed for indigestion or heartburn (for stomach pain due to spicy foods).   diltiazem 120 MG 24 hr capsule Commonly known as:  CARDIZEM CD Take 1 capsule (120 mg total) by mouth daily. Start taking on:  08/06/2018   diltiazem 30 MG tablet Commonly known as:  CARDIZEM Take 1 tablet (30 mg total) by mouth 4 (four) times daily as needed (For elevated HR).   docusate sodium 250 MG capsule Commonly known as:  COLACE Take 250 mg by mouth as needed for constipation.   metoprolol tartrate 25 MG tablet Commonly known as:  LOPRESSOR Take 1 tablet (25 mg total) by mouth 2 (two) times daily. What changed:  how much to take   oxyCODONE 5 MG immediate release tablet Commonly known as:  Oxy IR/ROXICODONE Take 1 tablet (5 mg total) by mouth every 6 (six) hours as needed for severe pain.        Acute coronary syndrome (MI, NSTEMI, STEMI, etc) this admission?: No.    Outstanding Labs/Studies   Will need follow up echocardiogram to follow pericardial effusion   Duration of Discharge Encounter   Greater than 30 minutes including physician time.  Signed, Kathyrn Drown, NP 08/05/2018, 11:46 AM   Personally seen and examined. Agree with above.  Feels well, sitting up in bed, no symptoms, no shortness of breath.  Physical exam noteworthy for surgical scar mid chest, well-healing heart regular rate and rhythm, lungs are clear, no edema, alert  Lab work personally reviewed.  No significant abnormalities. EKG-previous atrial fibrillation, currently on telemetry sinus rhythm.  Paroxysmal atrial fibrillation postoperatively -Converted with IV diltiazem drip.  We will go ahead and  give him p.o. diltiazem CD 120 mg once a day to hopefully ward off further episodes of atrial fibrillation.  We will also give him diltiazem 30 mg p.o. every 6 hours as needed if he begins to feel this at home.  I would continue with metoprolol 25 mill grams twice a day. if atrial fibrillation becomes a more recurrent issue, we may need to  utilize amiodarone short-term given his postoperative status.  Fairly rapid ventricular conduction, approaching 200 bpm.  Continue to hold lisinopril.  No anticoagulation.  Continue to monitor pericardial effusion as outpatient, hypertension.  Okay for discharge.  Close follow-up with cardiology and cardiothoracic surgery.  Candee Furbish, MD

## 2018-08-06 DIAGNOSIS — J439 Emphysema, unspecified: Secondary | ICD-10-CM | POA: Diagnosis not present

## 2018-08-06 DIAGNOSIS — I2510 Atherosclerotic heart disease of native coronary artery without angina pectoris: Secondary | ICD-10-CM | POA: Diagnosis not present

## 2018-08-06 DIAGNOSIS — Z48812 Encounter for surgical aftercare following surgery on the circulatory system: Secondary | ICD-10-CM | POA: Diagnosis not present

## 2018-08-06 DIAGNOSIS — M109 Gout, unspecified: Secondary | ICD-10-CM | POA: Diagnosis not present

## 2018-08-06 DIAGNOSIS — Z952 Presence of prosthetic heart valve: Secondary | ICD-10-CM | POA: Diagnosis not present

## 2018-08-06 DIAGNOSIS — M1990 Unspecified osteoarthritis, unspecified site: Secondary | ICD-10-CM | POA: Diagnosis not present

## 2018-08-06 DIAGNOSIS — I10 Essential (primary) hypertension: Secondary | ICD-10-CM | POA: Diagnosis not present

## 2018-08-06 DIAGNOSIS — K219 Gastro-esophageal reflux disease without esophagitis: Secondary | ICD-10-CM | POA: Diagnosis not present

## 2018-08-06 DIAGNOSIS — I700 Atherosclerosis of aorta: Secondary | ICD-10-CM | POA: Diagnosis not present

## 2018-08-07 DIAGNOSIS — Z952 Presence of prosthetic heart valve: Secondary | ICD-10-CM | POA: Diagnosis not present

## 2018-08-07 DIAGNOSIS — J439 Emphysema, unspecified: Secondary | ICD-10-CM | POA: Diagnosis not present

## 2018-08-07 DIAGNOSIS — K219 Gastro-esophageal reflux disease without esophagitis: Secondary | ICD-10-CM | POA: Diagnosis not present

## 2018-08-07 DIAGNOSIS — I2510 Atherosclerotic heart disease of native coronary artery without angina pectoris: Secondary | ICD-10-CM | POA: Diagnosis not present

## 2018-08-07 DIAGNOSIS — M1990 Unspecified osteoarthritis, unspecified site: Secondary | ICD-10-CM | POA: Diagnosis not present

## 2018-08-07 DIAGNOSIS — M109 Gout, unspecified: Secondary | ICD-10-CM | POA: Diagnosis not present

## 2018-08-07 DIAGNOSIS — Z48812 Encounter for surgical aftercare following surgery on the circulatory system: Secondary | ICD-10-CM | POA: Diagnosis not present

## 2018-08-07 DIAGNOSIS — I700 Atherosclerosis of aorta: Secondary | ICD-10-CM | POA: Diagnosis not present

## 2018-08-07 DIAGNOSIS — I10 Essential (primary) hypertension: Secondary | ICD-10-CM | POA: Diagnosis not present

## 2018-08-10 ENCOUNTER — Ambulatory Visit (HOSPITAL_COMMUNITY)
Admission: RE | Admit: 2018-08-10 | Discharge: 2018-08-10 | Disposition: A | Discharge status: Home / Self Care | Payer: Medicare HMO | Source: Ambulatory Visit | Attending: Cardiology | Admitting: Cardiology

## 2018-08-10 DIAGNOSIS — Q231 Congenital insufficiency of aortic valve: Secondary | ICD-10-CM

## 2018-08-10 DIAGNOSIS — I351 Nonrheumatic aortic (valve) insufficiency: Secondary | ICD-10-CM | POA: Diagnosis not present

## 2018-08-10 DIAGNOSIS — R0600 Dyspnea, unspecified: Secondary | ICD-10-CM | POA: Diagnosis not present

## 2018-08-10 DIAGNOSIS — I10 Essential (primary) hypertension: Secondary | ICD-10-CM | POA: Diagnosis not present

## 2018-08-10 DIAGNOSIS — I313 Pericardial effusion (noninflammatory): Secondary | ICD-10-CM | POA: Insufficient documentation

## 2018-08-10 NOTE — Progress Notes (Signed)
*  PRELIMINARY RESULTS* Echocardiogram 2D Echocardiogram has been performed.  Leavy Cella 08/10/2018, 9:17 AM

## 2018-08-12 NOTE — Progress Notes (Signed)
Cardiology Office Note  Date: 08/13/2018   ID: Lawrence [name], DOB 10-Aug-1951, MRN [id number]  PCP: [name], MD  Primary Cardiologist: [name], MD   Chief Complaint  Patient presents with  . Cardiac follow-up    History of Present Illness: Lawrence [name] is a 67 y.o. male that I last saw in April of this year.  Records indicate follow-up with Dr. Servando [name] in July for discussion regarding surgical repair of a 5 cm ascending aortic aneurysm associated with bicuspid aortic valve and no significant obstructive CAD by cardiac catheterization.  Patient deferred surgery until September 10 when he underwent supracoronary replacement of the ascending aorta with a 32 mm Hemashield graft no major postoperative complications.  He was then rehospitalized with rapid atrial fibrillation which converted spontaneously to sinus rhythm with heart rate control.  He was treated with combination of oral beta-blocker and calcium channel blocker, not anticoagulated due to finding of pericardial effusion, CHADSVASC score of 2.  Follow-up limited echocardiogram was recommended at discharge.  Echocardiogram from September 24 reported moderate sized pericardial effusion without clear evidence of hemodynamic significance.  Follow-up study done on September 30 showed a small circumferential pericardial effusion.  He presents today stating that he feels well overall.  He does not report any exertional chest pain or palpitations.  He states that he has been taking his medications regularly as reviewed below.  Not having to use any short acting diltiazem however.  I personally reviewed his ECG today which shows sinus rhythm with left atrial enlargement and inferolateral T wave inversions.  He has pending follow-up with Dr. Servando [name] in the next few weeks.  He has been gradually increasing his activity, still has lifting restrictions.  Today we talked about the follow-up echocardiogram results showing decreased  size pericardial effusion.  Also plan to observe rhythm for now.  If he has continued breakthrough atrial fibrillation we will need to reconsider anticoagulation.  Past Medical History:  Diagnosis Date  . Aortic atherosclerosis ([place])   . Aortic insufficiency   . Aortic stenosis   . Arthritis   . Bicuspid aortic valve    a. mild AS, mild-mod AI by echo 03/2017, 5cm thoracic aortic aneurysm by CT 07/2017.  Marland Kitchen Dyspnea   . Essential hypertension   . GERD (gastroesophageal reflux disease)   . Gout   . Postoperative atrial fibrillation ([place])   . Thoracic aortic aneurysm ([place])    a. 07/2017: 5cm by CT.    Past Surgical History:  Procedure Laterality Date  . ASCENDING AORTIC ROOT REPLACEMENT N/A 07/20/2018   Procedure: REPLACMENT OF ASCENDING AORTA WITH 32MM GRAFT. HYPOTHERMIC CIRCULATORY ARREST. WHEAT PROCEDURE.;  Surgeon: [name], MD;  Location: [place];  Service: Open Heart Surgery;  Laterality: N/A;  . COLONOSCOPY N/A 08/29/2016   Procedure: COLONOSCOPY;  Surgeon: Danie Binder, MD;  [facility];  Service: Endoscopy;  Laterality: N/A;  1030  . COLONOSCOPY N/A 03/09/2018   Procedure: COLONOSCOPY;  Surgeon: Danie Binder, MD;  [facility];  Service: Endoscopy;  Laterality: N/A;  1:15pm  . HERNIA REPAIR     umbilical  . MASS EXCISION N/A 03/18/2018   Procedure: EXCISION CYST ON BACK 2CM;  Surgeon: [name], MD;  Location: [facility];  Service: General;  Laterality: N/A;  . POLYPECTOMY  03/09/2018   Procedure: POLYPECTOMY;  Surgeon: Danie Binder, MD;  [facility];  Service: Endoscopy;;  ascending, transverse x2; descending, sigmoid  .  RIGHT/LEFT HEART CATH AND CORONARY ANGIOGRAPHY N/A 09/05/2017   Procedure: RIGHT/LEFT HEART CATH AND CORONARY ANGIOGRAPHY;  Surgeon: Larey Dresser, MD;  Location: [place] [facility];  Service: Cardiovascular;  Laterality: N/A;  . TEE WITHOUT CARDIOVERSION N/A 07/20/2018   Procedure: TRANSESOPHAGEAL  ECHOCARDIOGRAM (TEE);  Surgeon: [name], MD;  Location: [place];  Service: Open Heart Surgery;  Laterality: N/A;    Current Outpatient Medications  Medication Sig Dispense Refill  . allopurinol (ZYLOPRIM) 300 MG tablet Take 300 mg by mouth daily as needed (gout flare).     Marland Kitchen aspirin EC 325 MG EC tablet Take 1 tablet (325 mg total) by mouth daily. 30 tablet 0  . calcium carbonate (TUMS - DOSED IN MG ELEMENTAL CALCIUM) 500 MG chewable tablet Chew 1-2 tablets by mouth 2 (two) times daily as needed for indigestion or heartburn (for stomach pain due to spicy foods).     Marland Kitchen diltiazem (CARDIZEM CD) 120 MG 24 hr capsule Take 1 capsule (120 mg total) by mouth daily. 90 capsule 4  . diltiazem (CARDIZEM) 30 MG tablet Take 1 tablet (30 mg total) by mouth 4 (four) times daily as needed (For elevated HR). 90 tablet 11  . docusate sodium (COLACE) 250 MG capsule Take 250 mg by mouth as needed for constipation.    . metoprolol tartrate (LOPRESSOR) 25 MG tablet Take 1 tablet (25 mg total) by mouth 2 (two) times daily. 180 tablet 4  . oxyCODONE (OXY IR/ROXICODONE) 5 MG immediate release tablet Take 1 tablet (5 mg total) by mouth every 6 (six) hours as needed for severe pain. 30 tablet 0   No current facility-administered medications for this visit.    Allergies:  Ciprofloxacin and Morphine and related   Social History: The patient  reports that he quit smoking about 37 years ago. His smoking use included cigarettes. He has a 62.50 pack-year smoking history. He has never used smokeless tobacco. He reports that he drinks alcohol. He reports that he does not use drugs.   ROS:  Please see the history of present illness. Otherwise, complete review of systems is positive for none.  All other systems are reviewed and negative.   Physical Exam: VS:  BP 110/78   Pulse 82   Ht 5\' 10"  (1.778 m)   Wt 197 lb (89.4 kg)   SpO2 97%   BMI 28.27 kg/m , BMI Body mass index is 28.27 kg/m.  Wt Readings from Last 3  Encounters:  08/13/18 197 lb (89.4 kg)  08/05/18 192 lb 3.2 oz (87.2 kg)  07/27/18 205 lb 11 oz (93.3 kg)    General: Patient appears comfortable at rest. HEENT: Conjunctiva and lids normal, oropharynx clear. Neck: Supple, no elevated JVP or carotid bruits, no thyromegaly. Lungs: Clear to auscultation, nonlabored breathing at rest. Wax: Well-healing sternal incision. Cardiac: Regular rate and rhythm, no S3, soft systolic murmur, no pericardial rub. Abdomen: Soft, nontender, bowel sounds present. Extremities: No pitting edema, distal pulses 2+. Skin: Warm and dry. Musculoskeletal: No kyphosis. Neuropsychiatric: Alert and oriented x3, affect grossly appropriate.  ECG: I personally reviewed the tracing from 08/04/2018 which showed rapid atrial fibrillation with left anterior fascicular block.  Recent Labwork: 08/04/2018: Hemoglobin 13.1; Platelets 526 08/05/2018: ALT 71; AST 36; BUN 18; Creatinine, Ser 0.98; Magnesium 1.9; Potassium 4.3; Sodium 137; TSH 0.970     Component Value Date/Time   CHOL 193 09/02/2017 1420   TRIG 133 09/02/2017 1420   HDL 37 (L) 09/02/2017 1420   CHOLHDL  5.2 09/02/2017 1420   VLDL 27 09/02/2017 1420   LDLCALC 129 (H) 09/02/2017 1420    Other Studies Reviewed Today:  Echocardiogram 08/10/2018: Study Conclusions  - Left ventricle: The cavity size was normal. Wall thickness was  increased in a pattern of moderate LVH. Systolic function was  normal. The estimated ejection fraction was in the range of 55%  to 60%. Diastolic function is abnormal, indeterminant grade. Wall  motion was normal; there were no regional wall motion  abnormalities.  - Aortic valve: Mildly to moderately calcified annulus. Mildly  thickened leaflets. There was mild regurgitation. Valve area  (VTI): 2.84 cm^2. Valve area (Vmax): 2.77 cm^2.  - Aorta: History of supracoronary replacement of ascending aorta  with 32 mm Hemashield graft 07/21/2018. Aortic root dimension: 43  mm (ED).  -  Aortic root: The aortic root was mildly dilated.  - Mitral valve: Mildly calcified annulus. Normal thickness leaflets.  - Pericardium, extracardiac: Small circumferential pericardial  effusion. Effusion is notably smaller compared to 08/04/18 study.  - Technically adequate study.  Assessment and Plan:  1.  Ascending thoracic aortic aneurysm associated with bicuspid valve now status post supracoronary replacement of ascending aorta with 32 mm Hemashield graft on September 10.  Plan is for him to follow-up as scheduled with Dr. Servando [name] in the next few weeks.  He did have a follow-up echocardiogram done on September 30 for baseline.  2.  Postoperative atrial fibrillation, maintaining sinus rhythm at this time following spontaneous conversion with rate control during his last hospital stay.  I reviewed his ECG today.  At this point we will hold off on anticoagulation unless he has recurrences. CHADSVASC score is 2.  Patient is currently on aspirin 325 mg daily after recent thoracic surgery, anticipate reducing this to 81 mg daily if okay with Dr. Servando [name].  He did not undergo CABG.  3.  Pericardial effusion, small by follow-up echocardiogram on September 30 and decreased in size compared to the previous study.  4.  Essential hypertension by history.  Blood pressure is normal today.  5.  Normal coronary arteries at cardiac catheterization October 2018.  Current medicines were reviewed with the patient today.   Orders Placed This Encounter  Procedures  . EKG 12-Lead    Disposition: Follow-up in 6 to 8 weeks.  Signed, Satira Sark, MD, [facility] 08/13/2018 8:50 AM    [place] [facility] [place]. [address] [place], [place] [address] Phone: [phone]; Fax: [phone]

## 2018-08-13 ENCOUNTER — Ambulatory Visit (INDEPENDENT_AMBULATORY_CARE_PROVIDER_SITE_OTHER): Payer: Medicare HMO | Admitting: Cardiology

## 2018-08-13 ENCOUNTER — Encounter: Payer: Self-pay | Admitting: Cardiology

## 2018-08-13 VITALS — BP 110/78 | HR 82 | Ht 70.0 in | Wt 197.0 lb

## 2018-08-13 DIAGNOSIS — I10 Essential (primary) hypertension: Secondary | ICD-10-CM | POA: Diagnosis not present

## 2018-08-13 DIAGNOSIS — I712 Thoracic aortic aneurysm, without rupture, unspecified: Secondary | ICD-10-CM

## 2018-08-13 DIAGNOSIS — I313 Pericardial effusion (noninflammatory): Secondary | ICD-10-CM

## 2018-08-13 DIAGNOSIS — I3139 Other pericardial effusion (noninflammatory): Secondary | ICD-10-CM

## 2018-08-13 DIAGNOSIS — I9789 Other postprocedural complications and disorders of the circulatory system, not elsewhere classified: Secondary | ICD-10-CM | POA: Diagnosis not present

## 2018-08-13 DIAGNOSIS — I4891 Unspecified atrial fibrillation: Secondary | ICD-10-CM | POA: Diagnosis not present

## 2018-08-13 NOTE — Patient Instructions (Signed)
Your physician recommends that you schedule a follow-up appointment in:  6-8 weeks with Dr.McDowell     Your physician recommends that you continue on your current medications as directed. Please refer to the Current Medication list given to you today.    If you need a refill on your cardiac medications before your next appointment, please call your pharmacy.      No tests or labs today     Thank you for choosing [facility] !

## 2018-08-14 DIAGNOSIS — I2510 Atherosclerotic heart disease of native coronary artery without angina pectoris: Secondary | ICD-10-CM | POA: Diagnosis not present

## 2018-08-14 DIAGNOSIS — I10 Essential (primary) hypertension: Secondary | ICD-10-CM | POA: Diagnosis not present

## 2018-08-14 DIAGNOSIS — M109 Gout, unspecified: Secondary | ICD-10-CM | POA: Diagnosis not present

## 2018-08-14 DIAGNOSIS — M1990 Unspecified osteoarthritis, unspecified site: Secondary | ICD-10-CM | POA: Diagnosis not present

## 2018-08-14 DIAGNOSIS — K219 Gastro-esophageal reflux disease without esophagitis: Secondary | ICD-10-CM | POA: Diagnosis not present

## 2018-08-14 DIAGNOSIS — Z952 Presence of prosthetic heart valve: Secondary | ICD-10-CM | POA: Diagnosis not present

## 2018-08-14 DIAGNOSIS — Z48812 Encounter for surgical aftercare following surgery on the circulatory system: Secondary | ICD-10-CM | POA: Diagnosis not present

## 2018-08-14 DIAGNOSIS — I700 Atherosclerosis of aorta: Secondary | ICD-10-CM | POA: Diagnosis not present

## 2018-08-14 DIAGNOSIS — J439 Emphysema, unspecified: Secondary | ICD-10-CM | POA: Diagnosis not present

## 2018-08-17 DIAGNOSIS — Z6827 Body mass index (BMI) 27.0-27.9, adult: Secondary | ICD-10-CM | POA: Diagnosis not present

## 2018-08-17 DIAGNOSIS — I10 Essential (primary) hypertension: Secondary | ICD-10-CM | POA: Diagnosis not present

## 2018-08-17 DIAGNOSIS — I4891 Unspecified atrial fibrillation: Secondary | ICD-10-CM | POA: Diagnosis not present

## 2018-08-17 DIAGNOSIS — I313 Pericardial effusion (noninflammatory): Secondary | ICD-10-CM | POA: Diagnosis not present

## 2018-08-18 DIAGNOSIS — I2510 Atherosclerotic heart disease of native coronary artery without angina pectoris: Secondary | ICD-10-CM | POA: Diagnosis not present

## 2018-08-18 DIAGNOSIS — J439 Emphysema, unspecified: Secondary | ICD-10-CM | POA: Diagnosis not present

## 2018-08-18 DIAGNOSIS — K219 Gastro-esophageal reflux disease without esophagitis: Secondary | ICD-10-CM | POA: Diagnosis not present

## 2018-08-18 DIAGNOSIS — M1990 Unspecified osteoarthritis, unspecified site: Secondary | ICD-10-CM | POA: Diagnosis not present

## 2018-08-18 DIAGNOSIS — I700 Atherosclerosis of aorta: Secondary | ICD-10-CM | POA: Diagnosis not present

## 2018-08-18 DIAGNOSIS — M109 Gout, unspecified: Secondary | ICD-10-CM | POA: Diagnosis not present

## 2018-08-18 DIAGNOSIS — Z48812 Encounter for surgical aftercare following surgery on the circulatory system: Secondary | ICD-10-CM | POA: Diagnosis not present

## 2018-08-18 DIAGNOSIS — I10 Essential (primary) hypertension: Secondary | ICD-10-CM | POA: Diagnosis not present

## 2018-08-18 DIAGNOSIS — Z952 Presence of prosthetic heart valve: Secondary | ICD-10-CM | POA: Diagnosis not present

## 2018-08-21 ENCOUNTER — Other Ambulatory Visit: Payer: Self-pay | Admitting: Physician Assistant

## 2018-08-21 LAB — ECHO INTRAOPERATIVE TEE
AV Mean grad: 2 mmHg
Ao-asc: 4.9 cm
Height: 70 in
LVOT diameter: 23 mm
Mean grad: 1 mmHg
STJ: 3.6 cm
Sinus: 3.9 cm
Weight: 3364.78 oz

## 2018-08-25 DIAGNOSIS — Z952 Presence of prosthetic heart valve: Secondary | ICD-10-CM | POA: Diagnosis not present

## 2018-08-25 DIAGNOSIS — I2510 Atherosclerotic heart disease of native coronary artery without angina pectoris: Secondary | ICD-10-CM | POA: Diagnosis not present

## 2018-08-25 DIAGNOSIS — Z48812 Encounter for surgical aftercare following surgery on the circulatory system: Secondary | ICD-10-CM | POA: Diagnosis not present

## 2018-08-25 DIAGNOSIS — I700 Atherosclerosis of aorta: Secondary | ICD-10-CM | POA: Diagnosis not present

## 2018-08-25 DIAGNOSIS — M109 Gout, unspecified: Secondary | ICD-10-CM | POA: Diagnosis not present

## 2018-08-25 DIAGNOSIS — J439 Emphysema, unspecified: Secondary | ICD-10-CM | POA: Diagnosis not present

## 2018-08-25 DIAGNOSIS — I10 Essential (primary) hypertension: Secondary | ICD-10-CM | POA: Diagnosis not present

## 2018-08-25 DIAGNOSIS — K219 Gastro-esophageal reflux disease without esophagitis: Secondary | ICD-10-CM | POA: Diagnosis not present

## 2018-08-25 DIAGNOSIS — M1990 Unspecified osteoarthritis, unspecified site: Secondary | ICD-10-CM | POA: Diagnosis not present

## 2018-08-26 ENCOUNTER — Other Ambulatory Visit: Payer: Self-pay | Admitting: Cardiothoracic Surgery

## 2018-08-26 DIAGNOSIS — Z9889 Other specified postprocedural states: Secondary | ICD-10-CM

## 2018-08-27 ENCOUNTER — Other Ambulatory Visit: Payer: Self-pay

## 2018-08-27 ENCOUNTER — Ambulatory Visit
Admission: RE | Admit: 2018-08-27 | Discharge: 2018-08-27 | Disposition: A | Discharge status: Home / Self Care | Payer: Medicare HMO | Source: Ambulatory Visit | Attending: Cardiothoracic Surgery | Admitting: Cardiothoracic Surgery

## 2018-08-27 ENCOUNTER — Ambulatory Visit (INDEPENDENT_AMBULATORY_CARE_PROVIDER_SITE_OTHER): Payer: Self-pay | Admitting: Cardiothoracic Surgery

## 2018-08-27 ENCOUNTER — Encounter: Payer: Self-pay | Admitting: Cardiothoracic Surgery

## 2018-08-27 ENCOUNTER — Telehealth: Payer: Self-pay

## 2018-08-27 VITALS — BP 124/76 | HR 58 | Resp 18 | Ht 70.0 in | Wt 204.6 lb

## 2018-08-27 DIAGNOSIS — Z9889 Other specified postprocedural states: Secondary | ICD-10-CM

## 2018-08-27 DIAGNOSIS — I712 Thoracic aortic aneurysm, without rupture, unspecified: Secondary | ICD-10-CM

## 2018-08-27 DIAGNOSIS — J9811 Atelectasis: Secondary | ICD-10-CM | POA: Diagnosis not present

## 2018-08-27 NOTE — Telephone Encounter (Signed)
I was going to schedule pt for chest CT for f/u on thoracic aneurysm.I see patient had this repaired by Dr.Van Kerby Less in Sept 2019

## 2018-08-27 NOTE — Progress Notes (Signed)
[address]       [place] [address]             [phone]                  Lawrence [name] [place] Medical Record #[id number] Date of Birth: 09/01/1951  Referring MD:No ref. 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 SURGICAL PROCEDURE:  Replacement of ascending aorta supracoronary with 32 mm Hemashield graft with hypothermic circulatory arrest and antegrade cerebral perfusion. Wheat Procedure SURGEON:  Lanelle Bal, MD  History of Present Illness:      Patient returns to the office in follow-up after recent replacement of his ascending aorta.  His postoperative course after discharge was complicated by a brief readmission because of atrial fibrillation.  He was seen by cardiology and discharged home quickly, the decision was not to anticoagulate him.   He now returns to the office in follow-up visit doing well.  He denies any further episodes of fast heart rate.  Gaining strength appropriately.  He is moving along quick enough to be making plans to help a friend move from [place].   We reviewed recommendations for endocarditis prophylaxis with artificial graft material and bicuspid aortic valve.    Zubrod Score: At the time of surgery this patient's most appropriate activity status/level should be described as: [x]     0    Normal activity, no symptoms []     1    Restricted in physical strenuous activity but ambulatory, able to do out light work []     2    Ambulatory and capable of self care, unable to do work activities, up and about                 >50 % of waking hours                                                                                   []     3    Only limited self care,  in bed greater than 50% of waking hours []     4    Completely disabled, no self care, confined to bed or chair []     5    Moribund  Social History   Tobacco Use  Smoking Status Former Smoker  . Packs/day: 2.50  . Years: 25.00  . Pack years: 62.50  . Types: Cigarettes  . Last attempt to quit: 08/01/1981  . Years since quitting: 37.0  Smokeless Tobacco Never Used       Allergies  Allergen Reactions  . Ciprofloxacin   . Morphine And Related Other (See Comments)    Caused patient to pass out.     Current Outpatient Medications  Medication Sig Dispense Refill  . acetaminophen (TYLENOL) 500 MG tablet Take 500 mg by mouth every 6 (six) hours as needed.    Marland Kitchen allopurinol (ZYLOPRIM) 300 MG tablet Take 300 mg by mouth  daily as needed (gout flare).     Marland Kitchen aspirin EC 325 MG EC tablet Take 1 tablet (325 mg total) by mouth daily. 30 tablet 0  . calcium carbonate (TUMS - DOSED IN MG ELEMENTAL CALCIUM) 500 MG chewable tablet Chew 1-2 tablets by mouth 2 (two) times daily as needed for indigestion or heartburn (for stomach pain due to spicy foods).     Marland Kitchen diltiazem (CARDIZEM CD) 120 MG 24 hr capsule Take 1 capsule (120 mg total) by mouth daily. 90 capsule 4  . diltiazem (CARDIZEM) 30 MG tablet Take 1 tablet (30 mg total) by mouth 4 (four) times daily as needed (For elevated HR). 90 tablet 11  . docusate sodium (COLACE) 250 MG capsule Take 250 mg by mouth as needed for constipation.    . metoprolol tartrate (LOPRESSOR) 25 MG tablet Take 1 tablet (25 mg total) by mouth 2 (two) times daily. 180 tablet 4  . oxyCODONE (OXY IR/ROXICODONE) 5 MG immediate release tablet Take 1 tablet (5 mg total) by mouth every 6 (six) hours as needed for severe pain. 30 tablet 0   No current facility-administered medications for this visit.        Physical Exam: BP 124/76 (BP Location: Left Arm, Patient Position: Sitting, Cuff Size: Normal)   Pulse (!) 58   Resp 18   Ht 5\' 10"  (1.778 m)   Wt 204 lb 9.6 oz (92.8  kg)   SpO2 97% Comment: RA  BMI 29.36 kg/m   General appearance: alert and cooperative Neurologic: intact Heart: regular rate and rhythm, S1, S2 normal, no murmur, click, rub or gallop Lungs: clear to auscultation bilaterally Abdomen: soft, non-tender; bowel sounds normal; no masses,  no organomegaly Extremities: extremities normal, atraumatic, no cyanosis or edema and Homans sign is negative, no sign of DVT Wound: Sternum and right infraclavicular areas are well-healed   Diagnostic Studies & Laboratory data:         Recent Radiology Findings: Dg Chest 2 View  Result Date: 08/27/2018 CLINICAL DATA:  History of shortness of breath.  Prior surgery. EXAM: CHEST - 2 VIEW COMPARISON:  08/04/2018. FINDINGS: Prior median sternotomy. Heart size stable. Low lung volumes with mild bibasilar subsegmental axis. No pleural effusion or pneumothorax. Biapical pleural thickening noted consistent scarring. Mild elevation left hemidiaphragm. Gastric distention. No acute bony abnormality. IMPRESSION: 1. Prior median sternotomy. Heart size stable. No pulmonary venous congestion. 2.  Low lung volumes with mild bibasilar atelectasis. 3.  Elevation left hemidiaphragm.  Mild gastric distention. Electronically Signed   By: Marcello Moores  Register   On: 08/27/2018 12:37    left diaphram is unchanged from preop chest xray    I have independently reviewed the above radiology findings and reviewed findings  with the patient.  Recent Labs: Lab Results  Component Value Date   WBC 7.1 08/04/2018   HGB 13.1 08/04/2018   HCT 39.6 08/04/2018   PLT 526 (H) 08/04/2018   GLUCOSE 117 (H) 08/05/2018   CHOL 193 09/02/2017   TRIG 133 09/02/2017   HDL 37 (L) 09/02/2017   LDLCALC 129 (H) 09/02/2017   ALT 71 (H) 08/05/2018   AST 36 08/05/2018   NA 137 08/05/2018   K 4.3 08/05/2018   CL 105 08/05/2018   CREATININE 0.98 08/05/2018   BUN 18 08/05/2018   CO2 24 08/05/2018   TSH 0.970 08/05/2018   INR 1.71 07/20/2018    HGBA1C 6.0 (H) 07/16/2018      Assessment / Plan:  Patient doing well status post surgery, wounds healing well returning to near normal activities I cautioned him about heavy lifting for 3 months We will plan to see him back in 3 to 4 months Follow-up CTA of the chest 1 year postop    [name] 08/27/2018 2:15 PM

## 2018-09-01 DIAGNOSIS — I700 Atherosclerosis of aorta: Secondary | ICD-10-CM | POA: Diagnosis not present

## 2018-09-01 DIAGNOSIS — J439 Emphysema, unspecified: Secondary | ICD-10-CM | POA: Diagnosis not present

## 2018-09-01 DIAGNOSIS — M1990 Unspecified osteoarthritis, unspecified site: Secondary | ICD-10-CM | POA: Diagnosis not present

## 2018-09-01 DIAGNOSIS — K219 Gastro-esophageal reflux disease without esophagitis: Secondary | ICD-10-CM | POA: Diagnosis not present

## 2018-09-01 DIAGNOSIS — Z48812 Encounter for surgical aftercare following surgery on the circulatory system: Secondary | ICD-10-CM | POA: Diagnosis not present

## 2018-09-01 DIAGNOSIS — I2510 Atherosclerotic heart disease of native coronary artery without angina pectoris: Secondary | ICD-10-CM | POA: Diagnosis not present

## 2018-09-01 DIAGNOSIS — M109 Gout, unspecified: Secondary | ICD-10-CM | POA: Diagnosis not present

## 2018-09-01 DIAGNOSIS — Z952 Presence of prosthetic heart valve: Secondary | ICD-10-CM | POA: Diagnosis not present

## 2018-09-01 DIAGNOSIS — I10 Essential (primary) hypertension: Secondary | ICD-10-CM | POA: Diagnosis not present

## 2018-09-08 DIAGNOSIS — Z952 Presence of prosthetic heart valve: Secondary | ICD-10-CM | POA: Diagnosis not present

## 2018-09-08 DIAGNOSIS — J439 Emphysema, unspecified: Secondary | ICD-10-CM | POA: Diagnosis not present

## 2018-09-08 DIAGNOSIS — M109 Gout, unspecified: Secondary | ICD-10-CM | POA: Diagnosis not present

## 2018-09-08 DIAGNOSIS — I10 Essential (primary) hypertension: Secondary | ICD-10-CM | POA: Diagnosis not present

## 2018-09-08 DIAGNOSIS — M1990 Unspecified osteoarthritis, unspecified site: Secondary | ICD-10-CM | POA: Diagnosis not present

## 2018-09-08 DIAGNOSIS — K219 Gastro-esophageal reflux disease without esophagitis: Secondary | ICD-10-CM | POA: Diagnosis not present

## 2018-09-08 DIAGNOSIS — Z48812 Encounter for surgical aftercare following surgery on the circulatory system: Secondary | ICD-10-CM | POA: Diagnosis not present

## 2018-09-08 DIAGNOSIS — I700 Atherosclerosis of aorta: Secondary | ICD-10-CM | POA: Diagnosis not present

## 2018-09-08 DIAGNOSIS — I2510 Atherosclerotic heart disease of native coronary artery without angina pectoris: Secondary | ICD-10-CM | POA: Diagnosis not present

## 2018-09-15 DIAGNOSIS — J439 Emphysema, unspecified: Secondary | ICD-10-CM | POA: Diagnosis not present

## 2018-09-15 DIAGNOSIS — I700 Atherosclerosis of aorta: Secondary | ICD-10-CM | POA: Diagnosis not present

## 2018-09-15 DIAGNOSIS — Z952 Presence of prosthetic heart valve: Secondary | ICD-10-CM | POA: Diagnosis not present

## 2018-09-15 DIAGNOSIS — I2510 Atherosclerotic heart disease of native coronary artery without angina pectoris: Secondary | ICD-10-CM | POA: Diagnosis not present

## 2018-09-15 DIAGNOSIS — K219 Gastro-esophageal reflux disease without esophagitis: Secondary | ICD-10-CM | POA: Diagnosis not present

## 2018-09-15 DIAGNOSIS — Z48812 Encounter for surgical aftercare following surgery on the circulatory system: Secondary | ICD-10-CM | POA: Diagnosis not present

## 2018-09-15 DIAGNOSIS — I10 Essential (primary) hypertension: Secondary | ICD-10-CM | POA: Diagnosis not present

## 2018-09-15 DIAGNOSIS — M1990 Unspecified osteoarthritis, unspecified site: Secondary | ICD-10-CM | POA: Diagnosis not present

## 2018-09-15 DIAGNOSIS — M109 Gout, unspecified: Secondary | ICD-10-CM | POA: Diagnosis not present

## 2018-09-16 ENCOUNTER — Ambulatory Visit (HOSPITAL_COMMUNITY): Payer: Medicare HMO

## 2018-09-25 DIAGNOSIS — M109 Gout, unspecified: Secondary | ICD-10-CM | POA: Diagnosis not present

## 2018-09-25 DIAGNOSIS — K219 Gastro-esophageal reflux disease without esophagitis: Secondary | ICD-10-CM | POA: Diagnosis not present

## 2018-09-25 DIAGNOSIS — M1990 Unspecified osteoarthritis, unspecified site: Secondary | ICD-10-CM | POA: Diagnosis not present

## 2018-09-25 DIAGNOSIS — Z952 Presence of prosthetic heart valve: Secondary | ICD-10-CM | POA: Diagnosis not present

## 2018-09-25 DIAGNOSIS — J439 Emphysema, unspecified: Secondary | ICD-10-CM | POA: Diagnosis not present

## 2018-09-25 DIAGNOSIS — Z48812 Encounter for surgical aftercare following surgery on the circulatory system: Secondary | ICD-10-CM | POA: Diagnosis not present

## 2018-09-25 DIAGNOSIS — I10 Essential (primary) hypertension: Secondary | ICD-10-CM | POA: Diagnosis not present

## 2018-09-25 DIAGNOSIS — I2510 Atherosclerotic heart disease of native coronary artery without angina pectoris: Secondary | ICD-10-CM | POA: Diagnosis not present

## 2018-09-25 DIAGNOSIS — I700 Atherosclerosis of aorta: Secondary | ICD-10-CM | POA: Diagnosis not present

## 2018-09-29 ENCOUNTER — Ambulatory Visit: Payer: Medicare HMO | Admitting: Cardiology

## 2018-11-13 DIAGNOSIS — I10 Essential (primary) hypertension: Secondary | ICD-10-CM | POA: Diagnosis not present

## 2018-11-22 IMAGING — CR DG CHEST 1V PORT
1 series · 1 of 1 positions shown · non-contrast
Comparison: July 25, 2018

CLINICAL DATA: Tachycardia with cardiac palpitations

EXAM:
PORTABLE CHEST 1 VIEW

[portable]
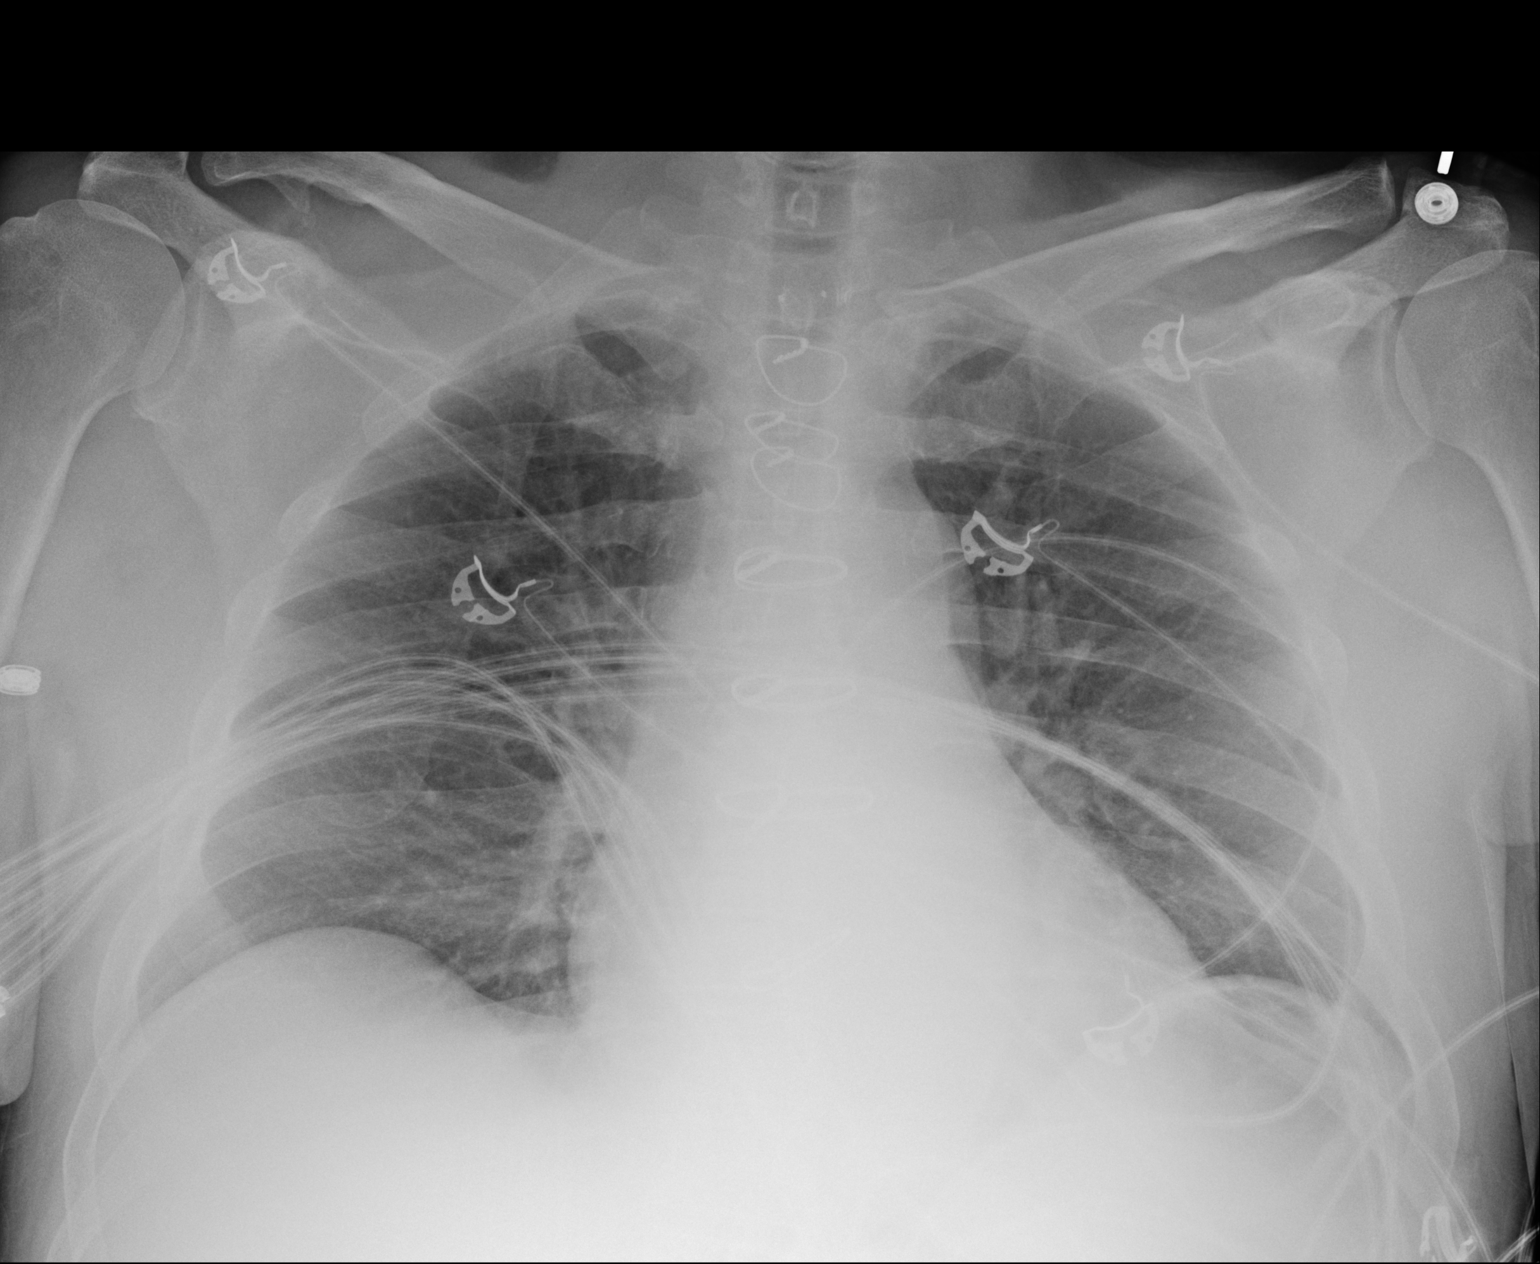

[1 of 1 positions shown; findings below may reference images not displayed]

FINDINGS: Lungs are clear. Heart is upper normal in size with pulmonary
vascularity normal. No adenopathy. Patient is status post median
sternotomy. No pneumothorax. No bone lesions.
IMPRESSION: Heart upper normal in size.  No edema or consolidation.

## 2018-11-27 DIAGNOSIS — Z8739 Personal history of other diseases of the musculoskeletal system and connective tissue: Secondary | ICD-10-CM | POA: Diagnosis not present

## 2018-11-27 DIAGNOSIS — I10 Essential (primary) hypertension: Secondary | ICD-10-CM | POA: Diagnosis not present

## 2018-11-27 DIAGNOSIS — Z86010 Personal history of colonic polyps: Secondary | ICD-10-CM | POA: Diagnosis not present

## 2018-11-27 DIAGNOSIS — I712 Thoracic aortic aneurysm, without rupture: Secondary | ICD-10-CM | POA: Diagnosis not present

## 2018-11-27 DIAGNOSIS — Z8679 Personal history of other diseases of the circulatory system: Secondary | ICD-10-CM | POA: Diagnosis not present

## 2018-11-27 DIAGNOSIS — J00 Acute nasopharyngitis [common cold]: Secondary | ICD-10-CM | POA: Diagnosis not present

## 2018-11-27 DIAGNOSIS — Z85038 Personal history of other malignant neoplasm of large intestine: Secondary | ICD-10-CM | POA: Diagnosis not present

## 2018-11-27 DIAGNOSIS — E782 Mixed hyperlipidemia: Secondary | ICD-10-CM | POA: Diagnosis not present

## 2018-11-27 DIAGNOSIS — R7301 Impaired fasting glucose: Secondary | ICD-10-CM | POA: Diagnosis not present

## 2018-12-03 ENCOUNTER — Ambulatory Visit (INDEPENDENT_AMBULATORY_CARE_PROVIDER_SITE_OTHER): Payer: Medicare HMO | Admitting: Cardiology

## 2018-12-03 ENCOUNTER — Encounter: Payer: Self-pay | Admitting: Cardiology

## 2018-12-03 VITALS — BP 120/74 | HR 84 | Ht 70.0 in | Wt 212.0 lb

## 2018-12-03 DIAGNOSIS — I712 Thoracic aortic aneurysm, without rupture, unspecified: Secondary | ICD-10-CM

## 2018-12-03 DIAGNOSIS — I4891 Unspecified atrial fibrillation: Secondary | ICD-10-CM | POA: Diagnosis not present

## 2018-12-03 DIAGNOSIS — I10 Essential (primary) hypertension: Secondary | ICD-10-CM

## 2018-12-03 DIAGNOSIS — I9789 Other postprocedural complications and disorders of the circulatory system, not elsewhere classified: Secondary | ICD-10-CM

## 2018-12-03 DIAGNOSIS — Q231 Congenital insufficiency of aortic valve: Secondary | ICD-10-CM

## 2018-12-03 NOTE — Patient Instructions (Signed)
Medication Instructions:  Your physician recommends that you continue on your current medications as directed. Please refer to the Current Medication list given to you today.  If you need a refill on your cardiac medications before your next appointment, please call your pharmacy.   Lab work: None today If you have labs (blood work) drawn today and your tests are completely normal, you will receive your results only by: . MyChart Message (if you have MyChart) OR . A paper copy in the mail If you have any lab test that is abnormal or we need to change your treatment, we will call you to review the results.  Testing/Procedures: None today  Follow-Up: At [facility], you and your health needs are our priority.  As part of our continuing mission to provide you with exceptional heart care, we have created designated Provider Care Teams.  These Care Teams include your primary Cardiologist (physician) and Advanced Practice Providers (APPs -  Physician Assistants and Nurse Practitioners) who all work together to provide you with the care you need, when you need it. You will need a follow up appointment in 6 months.  Please call our office 2 months in advance to schedule this appointment.  You may see Samuel McDowell, MD or one of the following Advanced Practice Providers on your designated Care Team:   Brittany Strader, PA-C ([place] Office) . Michele Lenze, PA-C ([place] Office)  Any Other Special Instructions Will Be Listed Below (If Applicable). None   

## 2018-12-03 NOTE — Progress Notes (Signed)
Cardiology Office Note  Date: 12/03/2018   ID: Lawrence [name], DOB 11/16/1950, MRN [id number]  PCP: [name], MD  Primary Cardiologist: [name], MD   Chief Complaint  Patient presents with  . Cardiac follow-up    History of Present Illness: Lawrence [name] is a 68 y.o. male that I last saw in October 2019.  He presents for a routine follow-up visit.  States that he feels well, plans to start back working part-time at [employer].  He has some residual postoperative chest soreness, no palpitations however.  Follow-up visit noted with Dr. Servando Snare in October 2019.  He has a follow-up visit pending.  I reviewed his medications.  He continues on combination of diltiazem CD and Lopressor.  He does have short acting Cardizem to take if he has any breakthrough palpitations, but has not had to take any.  He is not anticoagulated at this time in the absence of recurrent atrial arrhythmias.  Past Medical History:  Diagnosis Date  . Aortic atherosclerosis ([place])   . Aortic insufficiency   . Aortic stenosis   . Arthritis   . Bicuspid aortic valve    a. mild AS, mild-mod AI by echo 03/2017, 5cm thoracic aortic aneurysm by CT 07/2017.  Marland Kitchen Dyspnea   . Essential hypertension   . GERD (gastroesophageal reflux disease)   . Gout   . Postoperative atrial fibrillation ([place])   . Thoracic aortic aneurysm ([place])    a. 07/2017: 5cm by CT.    Past Surgical History:  Procedure Laterality Date  . ASCENDING AORTIC ROOT REPLACEMENT N/A 07/20/2018   Procedure: REPLACMENT OF ASCENDING AORTA WITH 32MM GRAFT. HYPOTHERMIC CIRCULATORY ARREST. WHEAT PROCEDURE.;  Surgeon: [name], MD;  Location: [facility];  Service: Open Heart Surgery;  Laterality: N/A;  . COLONOSCOPY N/A 08/29/2016   Procedure: COLONOSCOPY;  Surgeon: Danie Binder, MD;  [facility];  Service: Endoscopy;  Laterality: N/A;  1030  . COLONOSCOPY N/A 03/09/2018   Procedure: COLONOSCOPY;  Surgeon: Danie Binder, MD;   [facility];  Service: Endoscopy;  Laterality: N/A;  1:15pm  . HERNIA REPAIR     umbilical  . MASS EXCISION N/A 03/18/2018   Procedure: EXCISION CYST ON BACK 2CM;  Surgeon: [name], MD;  Location: [facility];  Service: General;  Laterality: N/A;  . POLYPECTOMY  03/09/2018   Procedure: POLYPECTOMY;  Surgeon: Danie Binder, MD;  [facility];  Service: Endoscopy;;  ascending, transverse x2; descending, sigmoid  . RIGHT/LEFT HEART CATH AND CORONARY ANGIOGRAPHY N/A 09/05/2017   Procedure: RIGHT/LEFT HEART CATH AND CORONARY ANGIOGRAPHY;  Surgeon: Larey Dresser, MD;  Location: [facility];  Service: Cardiovascular;  Laterality: N/A;  . TEE WITHOUT CARDIOVERSION N/A 07/20/2018   Procedure: TRANSESOPHAGEAL ECHOCARDIOGRAM (TEE);  Surgeon: [name], MD;  Location: [facility];  Service: Open Heart Surgery;  Laterality: N/A;    Current Outpatient Medications  Medication Sig Dispense Refill  . acetaminophen (TYLENOL) 500 MG tablet Take 500 mg by mouth every 6 (six) hours as needed.    Marland Kitchen allopurinol (ZYLOPRIM) 300 MG tablet Take 300 mg by mouth daily as needed (gout flare).     Marland Kitchen aspirin EC 325 MG EC tablet Take 1 tablet (325 mg total) by mouth daily. 30 tablet 0  . calcium carbonate (TUMS - DOSED IN MG ELEMENTAL CALCIUM) 500 MG chewable tablet Chew 1-2 tablets by mouth 2 (two) times daily as needed for indigestion  or heartburn (for stomach pain due to spicy foods).     . COLCRYS 0.6 MG tablet TK 1 T PO BID FOR ONE WEEK AT TIME OF FLARE UP    . diltiazem (CARDIZEM CD) 120 MG 24 hr capsule Take 1 capsule (120 mg total) by mouth daily. 90 capsule 4  . diltiazem (CARDIZEM) 30 MG tablet Take 1 tablet (30 mg total) by mouth 4 (four) times daily as needed (For elevated HR). 90 tablet 11  . docusate sodium (COLACE) 250 MG capsule Take 250 mg by mouth as needed for constipation.    . metoprolol tartrate (LOPRESSOR) 25 MG tablet Take 1 tablet (25 mg total) by mouth 2 (two)  times daily. 180 tablet 4  . oxyCODONE (OXY IR/ROXICODONE) 5 MG immediate release tablet Take 1 tablet (5 mg total) by mouth every 6 (six) hours as needed for severe pain. 30 tablet 0  . pravastatin (PRAVACHOL) 10 MG tablet Take 10 mg by mouth daily.      No current facility-administered medications for this visit.    Allergies:  Ciprofloxacin and Morphine and related   Social History: The patient  reports that he quit smoking about 37 years ago. His smoking use included cigarettes. He has a 62.50 pack-year smoking history. He has never used smokeless tobacco. He reports current alcohol use. He reports that he does not use drugs.   ROS:  Please see the history of present illness. Otherwise, complete review of systems is positive for none.  All other systems are reviewed and negative.   Physical Exam: VS:  BP 120/74 (BP Location: Left Arm)   Pulse 84   Ht 5\' 10"  (1.778 m)   Wt 212 lb (96.2 kg)   SpO2 98%   BMI 30.42 kg/m , BMI Body mass index is 30.42 kg/m.  Wt Readings from Last 3 Encounters:  12/03/18 212 lb (96.2 kg)  08/27/18 204 lb 9.6 oz (92.8 kg)  08/13/18 197 lb (89.4 kg)    General: Patient appears comfortable at rest. HEENT: Conjunctiva and lids normal, oropharynx clear. Neck: Supple, no elevated JVP or carotid bruits, no thyromegaly. Lungs: Clear to auscultation, nonlabored breathing at rest. Thorax: Well-healed sternal incision. Cardiac: Regular rate and rhythm, no S3, 2/6 systolic murmur, no pericardial rub. Abdomen: Soft, nontender, bowel sounds present. Extremities: No pitting edema, distal pulses 2+. Skin: Warm and dry. Musculoskeletal: No kyphosis. Neuropsychiatric: Alert and oriented x3, affect grossly appropriate.  ECG: I personally reviewed the tracing from 08/13/2018 which showed sinus rhythm with left atrial enlargement, inferolateral T wave inversions.  Recent Labwork: 08/04/2018: Hemoglobin 13.1; Platelets 526 08/05/2018: ALT 71; AST 36; BUN 18;  Creatinine, Ser 0.98; Magnesium 1.9; Potassium 4.3; Sodium 137; TSH 0.970     Component Value Date/Time   CHOL 193 09/02/2017 1420   TRIG 133 09/02/2017 1420   HDL 37 (L) 09/02/2017 1420   CHOLHDL 5.2 09/02/2017 1420   VLDL 27 09/02/2017 1420   LDLCALC 129 (H) 09/02/2017 1420    Other Studies Reviewed Today:  Echocardiogram 08/10/2018: Study Conclusions  - Left ventricle: The cavity size was normal. Wall thickness was   increased in a pattern of moderate LVH. Systolic function was   normal. The estimated ejection fraction was in the range of 55%   to 60%. Diastolic function is abnormal, indeterminant grade. Wall   motion was normal; there were no regional wall motion   abnormalities. - Aortic valve: Mildly to moderately calcified annulus. Mildly   thickened leaflets. There was  mild regurgitation. Valve area   (VTI): 2.84 cm^2. Valve area (Vmax): 2.77 cm^2. - Aorta: History of supracoronary replacement of ascending aorta   with 32 mm Hemashield graft 07/21/2018. Aortic root dimension: 43   mm (ED). - Aortic root: The aortic root was mildly dilated. - Mitral valve: Mildly calcified annulus. Normal thickness leaflets   . - Pericardium, extracardiac: Small circumferential pericardial   effusion. Effusion is notably smaller compared to 08/04/18 study. - Technically adequate study.  Chest x-ray 08/27/2018: IMPRESSION: 1. Prior median sternotomy. Heart size stable. No pulmonary venous congestion.  2.  Low lung volumes with mild bibasilar atelectasis.  3.  Elevation left hemidiaphragm.  Mild gastric distention.  Assessment and Plan:  1.  Ascending aortic aneurysm with bicuspid aortic valve status post supra coronary replacement of ascending aorta with 32 mm Hemashield graft in September 2019.  He is clinically stable and has follow-up pending with Dr. Servando Snare.  2.  Postoperative atrial fibrillation, no obvious recurrences.  We are holding off on anticoagulation.  Continue  with present AV nodal blocker regimen.  He has been on aspirin following surgery.  3.  Normal coronary arteries a cardiac catheterization October 2018.  4.  Essential hypertension, blood pressure is well controlled today.  Current medicines were reviewed with the patient today.  Disposition: Follow-up in 6 months.  Signed, Satira Sark, MD, [facility] 12/03/2018 3:48 PM    [place] [facility] [address] [place], [place] [address] Phone: [phone]; Fax: [phone]

## 2018-12-10 ENCOUNTER — Ambulatory Visit (INDEPENDENT_AMBULATORY_CARE_PROVIDER_SITE_OTHER): Payer: Medicare HMO | Admitting: Cardiothoracic Surgery

## 2018-12-10 VITALS — BP 137/93 | HR 60 | Resp 20 | Ht 70.0 in | Wt 209.0 lb

## 2018-12-10 DIAGNOSIS — I712 Thoracic aortic aneurysm, without rupture, unspecified: Secondary | ICD-10-CM

## 2018-12-10 DIAGNOSIS — Z9889 Other specified postprocedural states: Secondary | ICD-10-CM | POA: Diagnosis not present

## 2018-12-10 DIAGNOSIS — Q231 Congenital insufficiency of aortic valve: Secondary | ICD-10-CM | POA: Diagnosis not present

## 2018-12-10 NOTE — Progress Notes (Signed)
[address]       [place] [address]             [phone]                  Johnel A Streetman [place] Medical Record #[id number] Date of Birth: [birth year]  Referring XL:[id number], Aloha Gell, MD Primary Cardiology: Primary Care:Hall, Edwinna Areola, MD  Chief Complaint:  Follow Up Visit OPERATIVE REPORT DATE OF PROCEDURE:  07/20/2018 PREOPERATIVE DIAGNOSIS:  Dilated ascending aorta and bicuspid aortic valve. 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 SURGICAL PROCEDURE:  Replacement of ascending aorta supracoronary with 32 mm Hemashield graft with hypothermic circulatory arrest and antegrade cerebral perfusion. Wheat Procedure SURGEON:  Lanelle Bal, MD  History of Present Illness:      Patient returns to the office in follow-up after  replacement of his ascending aorta.  His postoperative course after discharge was complicated by a brief readmission because of atrial fibrillation.    We reviewed recommendations for endocarditis prophylaxis with artificial graft material and bicuspid aortic valve.  Patient continues to improve increase his physical activity.  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  Zubrod Score: At the time of surgery this patient's most appropriate activity status/level should be described as: [x]     0    Normal activity, no symptoms []     1    Restricted in physical strenuous activity but ambulatory, able to do out light work []     2    Ambulatory and capable of self care, unable to do work activities, up and about                 >50 % of waking hours                                                                                   []     3    Only limited self care, in bed greater than 50% of waking hours []     4    Completely disabled, no self care, confined to bed or chair []     5    Moribund  Social History   Tobacco Use  Smoking Status Former Smoker  . Packs/day: 2.50  .  Years: 25.00  . Pack years: 62.50  . Types: Cigarettes  . Last attempt to quit: 08/01/1981  . Years since quitting: 37.3  Smokeless Tobacco Never Used       Allergies  Allergen Reactions  . Ciprofloxacin   . Morphine And Related Other (See Comments)    Caused patient to pass out.     Current Outpatient Medications  Medication Sig Dispense Refill  . acetaminophen (TYLENOL) 500 MG tablet Take 500 mg by mouth every 6 (six) hours as needed.    Marland Kitchen allopurinol (ZYLOPRIM) 300 MG tablet Take 300 mg by mouth daily as needed (gout flare).     Marland Kitchen aspirin EC 325 MG EC tablet Take 1 tablet (325 mg total) by mouth daily. 30 tablet 0  . calcium carbonate (TUMS - DOSED IN MG ELEMENTAL CALCIUM) 500 MG chewable  tablet Chew 1-2 tablets by mouth 2 (two) times daily as needed for indigestion or heartburn (for stomach pain due to spicy foods).     . COLCRYS 0.6 MG tablet TK 1 T PO BID FOR ONE WEEK AT TIME OF FLARE UP    . diltiazem (CARDIZEM CD) 120 MG 24 hr capsule Take 1 capsule (120 mg total) by mouth daily. 90 capsule 4  . diltiazem (CARDIZEM) 30 MG tablet Take 1 tablet (30 mg total) by mouth 4 (four) times daily as needed (For elevated HR). 90 tablet 11  . docusate sodium (COLACE) 250 MG capsule Take 250 mg by mouth as needed for constipation.    . metoprolol tartrate (LOPRESSOR) 25 MG tablet Take 1 tablet (25 mg total) by mouth 2 (two) times daily. 180 tablet 4  . oxyCODONE (OXY IR/ROXICODONE) 5 MG immediate release tablet Take 1 tablet (5 mg total) by mouth every 6 (six) hours as needed for severe pain. 30 tablet 0  . pravastatin (PRAVACHOL) 10 MG tablet Take 10 mg by mouth daily.      No current facility-administered medications for this visit.        Physical Exam: BP (!) 137/93   Pulse 60   Resp 20   Ht 5\' 10"  (1.778 m)   Wt 209 lb (94.8 kg)   SpO2 97% Comment: RA  BMI 29.99 kg/m  General appearance: alert, cooperative and no distress Head: Normocephalic, without obvious abnormality,  atraumatic Neck: no adenopathy, no carotid bruit, no JVD, supple, symmetrical, trachea midline and thyroid not enlarged, symmetric, no tenderness/mass/nodules Lymph nodes: Cervical, supraclavicular, and axillary nodes normal. Resp: clear to auscultation bilaterally Back: symmetric, no curvature. ROM normal. No CVA tenderness. Cardio: regular rate and rhythm, S1, S2 normal, no murmur, click, rub or gallop GI: soft, non-tender; bowel sounds normal; no masses,  no organomegaly Extremities: extremities normal, atraumatic, no cyanosis or edema Neurologic: Grossly normal   Diagnostic Studies & Laboratory data:         Recent Radiology Findings:   Recent Labs: Lab Results  Component Value Date   WBC 7.1 08/04/2018   HGB 13.1 08/04/2018   HCT 39.6 08/04/2018   PLT 526 (H) 08/04/2018   GLUCOSE 117 (H) 08/05/2018   CHOL 193 09/02/2017   TRIG 133 09/02/2017   HDL 37 (L) 09/02/2017   LDLCALC 129 (H) 09/02/2017   ALT 71 (H) 08/05/2018   AST 36 08/05/2018   NA 137 08/05/2018   K 4.3 08/05/2018   CL 105 08/05/2018   CREATININE 0.98 08/05/2018   BUN 18 08/05/2018   CO2 24 08/05/2018   TSH 0.970 08/05/2018   INR 1.71 07/20/2018   HGBA1C 6.0 (H) 07/16/2018      Assessment / Plan:   Patient doing well following replacement of ascending aorta with preservation of his bicuspid aortic valve  Plan to see the patient back in 6 to 7 months which will be close to 1 year postop with a CTA of the chest  Preop elevation of his left hemidiaphragm  [name] 12/10/2018 1:11 PM

## 2019-01-15 DIAGNOSIS — R42 Dizziness and giddiness: Secondary | ICD-10-CM | POA: Diagnosis not present

## 2019-01-15 DIAGNOSIS — R21 Rash and other nonspecific skin eruption: Secondary | ICD-10-CM | POA: Diagnosis not present

## 2019-02-06 ENCOUNTER — Other Ambulatory Visit: Payer: Self-pay | Admitting: Physician Assistant

## 2019-02-26 DIAGNOSIS — R21 Rash and other nonspecific skin eruption: Secondary | ICD-10-CM | POA: Diagnosis not present

## 2019-02-26 DIAGNOSIS — R7301 Impaired fasting glucose: Secondary | ICD-10-CM | POA: Diagnosis not present

## 2019-02-26 DIAGNOSIS — L02232 Carbuncle of back [any part, except buttock]: Secondary | ICD-10-CM | POA: Diagnosis not present

## 2019-02-26 DIAGNOSIS — I4891 Unspecified atrial fibrillation: Secondary | ICD-10-CM | POA: Diagnosis not present

## 2019-02-26 DIAGNOSIS — L02222 Furuncle of back [any part, except buttock]: Secondary | ICD-10-CM | POA: Diagnosis not present

## 2019-02-26 DIAGNOSIS — Z6827 Body mass index (BMI) 27.0-27.9, adult: Secondary | ICD-10-CM | POA: Diagnosis not present

## 2019-02-26 DIAGNOSIS — I313 Pericardial effusion (noninflammatory): Secondary | ICD-10-CM | POA: Diagnosis not present

## 2019-02-26 DIAGNOSIS — Z6829 Body mass index (BMI) 29.0-29.9, adult: Secondary | ICD-10-CM | POA: Diagnosis not present

## 2019-02-26 DIAGNOSIS — I10 Essential (primary) hypertension: Secondary | ICD-10-CM | POA: Diagnosis not present

## 2019-02-26 DIAGNOSIS — R42 Dizziness and giddiness: Secondary | ICD-10-CM | POA: Diagnosis not present

## 2019-03-04 DIAGNOSIS — E782 Mixed hyperlipidemia: Secondary | ICD-10-CM | POA: Diagnosis not present

## 2019-03-04 DIAGNOSIS — Z8739 Personal history of other diseases of the musculoskeletal system and connective tissue: Secondary | ICD-10-CM | POA: Diagnosis not present

## 2019-03-04 DIAGNOSIS — Z86010 Personal history of colonic polyps: Secondary | ICD-10-CM | POA: Diagnosis not present

## 2019-03-04 DIAGNOSIS — R7301 Impaired fasting glucose: Secondary | ICD-10-CM | POA: Diagnosis not present

## 2019-03-04 DIAGNOSIS — I480 Paroxysmal atrial fibrillation: Secondary | ICD-10-CM | POA: Diagnosis not present

## 2019-03-04 DIAGNOSIS — R945 Abnormal results of liver function studies: Secondary | ICD-10-CM | POA: Diagnosis not present

## 2019-03-04 DIAGNOSIS — I712 Thoracic aortic aneurysm, without rupture: Secondary | ICD-10-CM | POA: Diagnosis not present

## 2019-03-04 DIAGNOSIS — M545 Low back pain: Secondary | ICD-10-CM | POA: Diagnosis not present

## 2019-03-04 DIAGNOSIS — I10 Essential (primary) hypertension: Secondary | ICD-10-CM | POA: Diagnosis not present

## 2019-03-17 DIAGNOSIS — Z0000 Encounter for general adult medical examination without abnormal findings: Secondary | ICD-10-CM | POA: Diagnosis not present

## 2019-05-20 ENCOUNTER — Other Ambulatory Visit: Payer: Self-pay | Admitting: *Deleted

## 2019-05-20 DIAGNOSIS — I712 Thoracic aortic aneurysm, without rupture, unspecified: Secondary | ICD-10-CM

## 2019-05-20 NOTE — Progress Notes (Unsigned)
Ct a 

## 2019-07-08 ENCOUNTER — Ambulatory Visit
Admission: RE | Admit: 2019-07-08 | Discharge: 2019-07-08 | Disposition: A | Discharge status: Home / Self Care | Payer: Medicare HMO | Source: Ambulatory Visit | Attending: Cardiothoracic Surgery | Admitting: Cardiothoracic Surgery

## 2019-07-08 ENCOUNTER — Other Ambulatory Visit: Payer: Self-pay

## 2019-07-08 ENCOUNTER — Encounter: Payer: Self-pay | Admitting: Cardiothoracic Surgery

## 2019-07-08 ENCOUNTER — Ambulatory Visit: Payer: Medicare HMO | Admitting: Cardiothoracic Surgery

## 2019-07-08 VITALS — BP 136/78 | HR 55 | Temp 97.7°F | Resp 18 | Ht 70.0 in | Wt 215.0 lb

## 2019-07-08 DIAGNOSIS — J439 Emphysema, unspecified: Secondary | ICD-10-CM | POA: Diagnosis not present

## 2019-07-08 DIAGNOSIS — I712 Thoracic aortic aneurysm, without rupture, unspecified: Secondary | ICD-10-CM

## 2019-07-08 MED ORDER — IOPAMIDOL (ISOVUE-370) INJECTION 76%
75.0000 mL | Freq: Once | INTRAVENOUS | Status: AC | PRN
  Administered 2019-07-08: 75 mL via INTRAVENOUS

## 2019-07-08 NOTE — Progress Notes (Signed)
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 SURGICAL PROCEDURE:  Replacement of ascending aorta supracoronary with 32 mm Hemashield graft with hypothermic circulatory arrest and antegrade cerebral perfusion. [place] Procedure SURGEON:  Lanelle Bal, MD  History of Present Illness:      Patient returns to the office in follow-up after  replacement of his ascending aorta approximately 1 year ago.  his postoperative course after discharge was complicated by a brief readmission because of atrial fibrillation.  Patient's had no further known atrial fibrillation.  We reviewed recommendations for endocarditis prophylaxis with artificial graft material and bicuspid aortic valve.  He continues to work in [employer] 4 to 5 hours a day.  He denies shortness of breath or chest pain.  He does note some difficulty in breathing with wearing a mask  Zubrod Score: At the time of surgery this patient's most appropriate activity status/level should be described as: [x]     0    Normal activity, no symptoms []     1    Restricted in physical strenuous activity but ambulatory, able to do out light work []     2    Ambulatory and capable of self care, unable to do work activities, up and about                 >50 % of waking hours                                                                                   []     3    Only limited self care, in bed greater than 50% of waking hours []     4    Completely disabled, no self care, confined to bed or chair  []     5    Moribund  Social History   Tobacco Use  Smoking Status Former Smoker  . Packs/day: 2.50  . Years: 25.00  . Pack years: 62.50  . Types: Cigarettes  . Quit date: 08/01/1981  . Years since quitting: 37.9  Smokeless Tobacco Never Used       Allergies  Allergen Reactions  . Ciprofloxacin   . Morphine And Related Other (See Comments)    Caused patient to pass out.     Current Outpatient Medications  Medication Sig Dispense Refill  . acetaminophen (TYLENOL) 500 MG tablet Take 500 mg by mouth every 6 (six) hours as needed.    Marland Kitchen allopurinol (ZYLOPRIM) 300 MG tablet Take 300 mg by mouth daily as needed (gout flare).     Marland Kitchen diltiazem (CARDIZEM CD) 120 MG 24 hr capsule Take 1 capsule (120  mg total) by mouth daily. 90 capsule 4  . metoprolol tartrate (LOPRESSOR) 25 MG tablet Take 1 tablet (25 mg total) by mouth 2 (two) times daily. 180 tablet 4  . pravastatin (PRAVACHOL) 10 MG tablet Take 10 mg by mouth daily.      No current facility-administered medications for this visit.        Physical Exam: BP 136/78 (BP Location: Right Arm, Patient Position: Sitting, Cuff Size: Normal)   Pulse (!) 55   Temp 97.7 F (36.5 C)   Resp 18   Ht 5\' 10"  (1.778 m)   Wt 215 lb (97.5 kg)   SpO2 97% Comment: RA  BMI 30.85 kg/m  General appearance: alert, cooperative and no distress Head: Normocephalic, without obvious abnormality, atraumatic Neck: no adenopathy, no carotid bruit, no JVD, supple, symmetrical, trachea midline and thyroid not enlarged, symmetric, no tenderness/mass/nodules Lymph nodes: Cervical, supraclavicular, and axillary nodes normal. Resp: clear to auscultation bilaterally Cardio: regular rate and rhythm, S1, S2 normal, no murmur, click, rub or gallop GI: soft, non-tender; bowel sounds normal; no masses,  no organomegaly Extremities: extremities normal, atraumatic, no cyanosis or edema and Homans sign is negative, no sign of DVT Neurologic: Grossly normal   Diagnostic Studies & Laboratory data:         Recent Radiology Findings: Ct Angio Chest Aorta W &/or Wo Contrast  Result Date: 07/08/2019 CLINICAL DATA:  Thoracic aortic aneurysm. EXAM: CT ANGIOGRAPHY CHEST WITH CONTRAST TECHNIQUE: Multidetector CT imaging of the chest was performed using the standard protocol during bolus administration of intravenous contrast. Multiplanar CT image reconstructions and MIPs were obtained to evaluate the vascular anatomy. CONTRAST:  27mL ISOVUE-370 IOPAMIDOL (ISOVUE-370) INJECTION 76% COMPARISON:  CT scan of February 24, 2018. FINDINGS: Cardiovascular: Status post surgical repair of ascending thoracic aortic aneurysm noted on prior exam. No dissection is noted. Great vessels are widely patent. No aneurysmal dilatation is noted currently. Normal cardiac size. No pericardial effusion. Mediastinum/Nodes: No enlarged mediastinal, hilar, or axillary lymph nodes. Thyroid gland, trachea, and esophagus demonstrate no significant findings. Lungs/Pleura: No pneumothorax or pleural effusion is noted. Mild emphysematous disease is noted in the upper lobes. Stable bilateral lower lobe subsegmental atelectasis or scarring. Upper Abdomen: No acute abnormality. Musculoskeletal: No chest wall abnormality. No acute or significant osseous findings. Review of the MIP images confirms the above findings. IMPRESSION: Status post surgical repair of ascending thoracic aortic aneurysm. No dissection or aneurysmal dilatation is noted at this time. Stable bibasilar subsegmental atelectasis or scarring is noted. Emphysema (ICD10-J43.9). Electronically Signed   By: Marijo Conception M.D.   On: 07/08/2019 12:59  I have independently reviewed the above radiology studies  and reviewed the findings with the patient.   Recent Labs: Lab Results  Component Value Date   WBC 7.1 08/04/2018   HGB 13.1 08/04/2018   HCT 39.6 08/04/2018   PLT 526 (H) 08/04/2018   GLUCOSE 117 (H) 08/05/2018   CHOL 193 09/02/2017    TRIG 133 09/02/2017   HDL 37 (L) 09/02/2017   LDLCALC 129 (H) 09/02/2017   ALT 71 (H) 08/05/2018   AST 36 08/05/2018   NA 137 08/05/2018   K 4.3 08/05/2018   CL 105 08/05/2018   CREATININE 0.98 08/05/2018   BUN 18 08/05/2018   CO2 24 08/05/2018   TSH 0.970 08/05/2018   INR 1.71 07/20/2018   HGBA1C 6.0 (H) 07/16/2018      Assessment / Plan:   Stable following replacement of his a sending aorta with  functioning bicuspid aortic valve without stenosis or insufficiency, last echo done late September 2019  Preop elevation of his left hemidiaphragm  Plan follow-up in 1 year  [name] 07/08/2019 2:54 PM

## 2019-08-20 ENCOUNTER — Telehealth: Payer: Self-pay | Admitting: Cardiology

## 2019-08-20 NOTE — Telephone Encounter (Signed)

## 2019-08-30 ENCOUNTER — Ambulatory Visit: Payer: Medicare HMO | Admitting: Cardiology

## 2019-09-01 DIAGNOSIS — I10 Essential (primary) hypertension: Secondary | ICD-10-CM | POA: Diagnosis not present

## 2019-09-01 DIAGNOSIS — R945 Abnormal results of liver function studies: Secondary | ICD-10-CM | POA: Diagnosis not present

## 2019-09-01 DIAGNOSIS — R7301 Impaired fasting glucose: Secondary | ICD-10-CM | POA: Diagnosis not present

## 2019-09-01 DIAGNOSIS — E782 Mixed hyperlipidemia: Secondary | ICD-10-CM | POA: Diagnosis not present

## 2019-09-02 NOTE — Progress Notes (Signed)
Virtual Visit via Telephone Note   This visit type was conducted due to national recommendations for restrictions regarding the COVID-19 Pandemic (e.g. social distancing) in an effort to limit this patient's exposure and mitigate transmission in our community.  Due to his co-morbid illnesses, this patient is at least at moderate risk for complications without adequate follow up.  This format is felt to be most appropriate for this patient at this time.  The patient did not have access to video technology/had technical difficulties with video requiring transitioning to audio format only (telephone).  All issues noted in this document were discussed and addressed.  No physical exam could be performed with this format.  Please refer to the patient's chart for his  consent to telehealth for [facility].   Date:  09/03/2019   ID:  Lawrence [name], DOB 1951/07/15, MRN [id number]  Patient Location: Home Provider Location: Home  PCP:  [name], MD  Cardiologist:  [name], MD Electrophysiologist:  None   Evaluation Performed:  Follow-Up Visit  Chief Complaint:   Cardiac follow-up  History of Present Illness:    Lawrence [name] is a 68 y.o. male last seen in January.  We spoke by phone today.  He tells me that he has been doing well.  He does not report any significant shortness of breath with typical activities, no chest pain or palpitations.  He is working 3 days a week at [employer] in [place].  He also would like to start walking more for exercise and using some light hand weights.  Follow-up visit with Dr. Servando Snare noted in August, I reviewed note.  Echocardiogram from September 2019 is outlined below.  We discussed obtaining a follow-up study prior to his visit next year.  He had recent lab work done per Dr. Nevada Crane, we are requesting the results for review.  I reviewed his medications which are outlined below.  He reports compliance.  The patient does not have symptoms  concerning for COVID-19 infection (fever, chills, cough, or new shortness of breath).    Past Medical History:  Diagnosis Date  . Aortic atherosclerosis ([place])   . Aortic insufficiency   . Aortic stenosis   . Arthritis   . Bicuspid aortic valve    a. mild AS, mild-mod AI by echo 03/2017, 5cm thoracic aortic aneurysm by CT 07/2017.  . Essential hypertension   . GERD (gastroesophageal reflux disease)   . Gout   . Postoperative atrial fibrillation ([place])   . Thoracic aortic aneurysm ([place])    a. 07/2017: 5cm by CT.   Past Surgical History:  Procedure Laterality Date  . ASCENDING AORTIC ROOT REPLACEMENT N/A 07/20/2018   Procedure: REPLACMENT OF ASCENDING AORTA WITH 32MM GRAFT. HYPOTHERMIC CIRCULATORY ARREST. WHEAT PROCEDURE.;  Surgeon: [name], MD;  Location: [facility];  Service: Open Heart Surgery;  Laterality: N/A;  . COLONOSCOPY N/A 08/29/2016   Procedure: COLONOSCOPY;  Surgeon: Danie Binder, MD;  [facility];  Service: Endoscopy;  Laterality: N/A;  1030  . COLONOSCOPY N/A 03/09/2018   Procedure: COLONOSCOPY;  Surgeon: Danie Binder, MD;  [facility];  Service: Endoscopy;  Laterality: N/A;  1:15pm  . HERNIA REPAIR     umbilical  . MASS EXCISION N/A 03/18/2018   Procedure: EXCISION CYST ON BACK 2CM;  Surgeon: [name], MD;  Location: [facility];  Service: General;  Laterality: N/A;  . POLYPECTOMY  03/09/2018   Procedure: POLYPECTOMY;  Surgeon: Barney Drain  L, MD;  [facility];  Service: Endoscopy;;  ascending, transverse x2; descending, sigmoid  . RIGHT/LEFT HEART CATH AND CORONARY ANGIOGRAPHY N/A 09/05/2017   Procedure: RIGHT/LEFT HEART CATH AND CORONARY ANGIOGRAPHY;  Surgeon: Larey Dresser, MD;  Location: [facility];  Service: Cardiovascular;  Laterality: N/A;  . TEE WITHOUT CARDIOVERSION N/A 07/20/2018   Procedure: TRANSESOPHAGEAL ECHOCARDIOGRAM (TEE);  Surgeon: [name], MD;  Location: [facility];  Service: Open Heart  Surgery;  Laterality: N/A;     Current Meds  Medication Sig  . acetaminophen (TYLENOL) 500 MG tablet Take 500 mg by mouth every 6 (six) hours as needed.  . diltiazem (CARDIZEM CD) 120 MG 24 hr capsule Take 1 capsule (120 mg total) by mouth daily.  . metoprolol tartrate (LOPRESSOR) 25 MG tablet Take 1 tablet (25 mg total) by mouth 2 (two) times daily.  . pravastatin (PRAVACHOL) 10 MG tablet Take 10 mg by mouth daily.      Allergies:   Ciprofloxacin and Morphine and related   Social History   Tobacco Use  . Smoking status: Former Smoker    Packs/day: 2.50    Years: 25.00    Pack years: 62.50    Types: Cigarettes    Quit date: 08/01/1981    Years since quitting: 38.1  . Smokeless tobacco: Never Used  Substance Use Topics  . Alcohol use: Yes    Comment: occ  . Drug use: No     Family Hx: The patient's family history includes Colon polyps in his sister; Heart attack in his mother; Stomach cancer in his father. There is no history of Colon cancer.  ROS:   Please see the history of present illness. All other systems reviewed and are negative.   Prior CV studies:   The following studies were reviewed today:  Echocardiogram 08/10/2018: Study Conclusions  - Left ventricle: The cavity size was normal. Wall thickness was   increased in a pattern of moderate LVH. Systolic function was   normal. The estimated ejection fraction was in the range of 55%   to 60%. Diastolic function is abnormal, indeterminant grade. Wall   motion was normal; there were no regional wall motion   abnormalities. - Aortic valve: Mildly to moderately calcified annulus. Mildly   thickened leaflets. There was mild regurgitation. Valve area   (VTI): 2.84 cm^2. Valve area (Vmax): 2.77 cm^2. - Aorta: History of supracoronary replacement of ascending aorta   with 32 mm Hemashield graft 07/21/2018. Aortic root dimension: 43   mm (ED). - Aortic root: The aortic root was mildly dilated. - Mitral valve: Mildly  calcified annulus. Normal thickness leaflets. - Pericardium, extracardiac: Small circumferential pericardial   effusion. Effusion is notably smaller compared to 08/04/18 study. - Technically adequate study.  Labs/Other Tests and Data Reviewed:    EKG:  An ECG dated 08/13/2018 was personally reviewed today and demonstrated:  Sinus rhythm with left atrial enlargement, leftward axis, inferolateral T wave inversions.  Recent Labs:  January 2020: Hemoglobin 16.3, platelets 225, BUN 23, creatinine 1.16, potassium 4.9, AST 19, ALT 32, cholesterol 182, triglycerides 119, HDL 35, LDL 123  Wt Readings from Last 3 Encounters:  09/03/19 214 lb (97.1 kg)  07/08/19 215 lb (97.5 kg)  12/10/18 209 lb (94.8 kg)     Objective:    Vital Signs:  Pulse 72   Ht 5\' 10"  (1.778 m)   Wt 214 lb (97.1 kg)   SpO2 98%   BMI 30.71 kg/m  He did not have a way to check his blood pressure today. Patient spoke in full sentences, not short of breath. No audible wheezing or coughing. Speech pattern normal.  ASSESSMENT & PLAN:    1.  Ascending aortic aneurysm status post supracoronary replacement of the ascending aorta with a 32 mm Hemashield graft in September 2019.  Interval follow-up with Dr. Servando Snare reviewed.  He is doing well at this time.  2.  Bicuspid aortic valve, follow-up echocardiogram will be obtained prior to his visit next year.  3.  Normal coronary arteries at cardiac catheterization in October 2018.  4.  Essential hypertension.  We are getting him set up to receive a blood pressure cuff so that he can monitor blood pressure at home.  He otherwise continues on Lopressor and Cardizem CD.  Keep follow-up with Dr. Nevada Crane.  COVID-19 Education: The signs and symptoms of COVID-19 were discussed with the patient and how to seek care for testing (follow up with PCP or arrange E-visit).  The importance of social distancing was discussed today.  Time:   Today, I have spent 9 minutes with the patient  with telehealth technology discussing the above problems.     Medication Adjustments/Labs and Tests Ordered: Current medicines are reviewed at length with the patient today.  Concerns regarding medicines are outlined above.   Tests Ordered: Orders Placed This Encounter  Procedures  . ECHOCARDIOGRAM COMPLETE    Medication Changes: No orders of the defined types were placed in this encounter.   Follow Up:  In Person August or September of next year in the [place] office.  Signed, [name], MD  09/03/2019 8:43 AM    [place]

## 2019-09-03 ENCOUNTER — Telehealth: Payer: Self-pay | Admitting: Licensed Clinical Social Worker

## 2019-09-03 ENCOUNTER — Encounter: Payer: Self-pay | Admitting: Cardiology

## 2019-09-03 ENCOUNTER — Telehealth (INDEPENDENT_AMBULATORY_CARE_PROVIDER_SITE_OTHER): Payer: Medicare HMO | Admitting: Cardiology

## 2019-09-03 VITALS — HR 72 | Ht 70.0 in | Wt 214.0 lb

## 2019-09-03 DIAGNOSIS — I712 Thoracic aortic aneurysm, without rupture, unspecified: Secondary | ICD-10-CM

## 2019-09-03 DIAGNOSIS — I10 Essential (primary) hypertension: Secondary | ICD-10-CM | POA: Diagnosis not present

## 2019-09-03 DIAGNOSIS — Q231 Congenital insufficiency of aortic valve: Secondary | ICD-10-CM

## 2019-09-03 DIAGNOSIS — I4891 Unspecified atrial fibrillation: Secondary | ICD-10-CM

## 2019-09-03 NOTE — Telephone Encounter (Signed)
CSW referred to assist patient with obtaining a BP cuff. CSW contacted patient to inform cuff will be delivered to home. Patient grateful for support and assistance. CSW available as needed. Jackie Kamoria Lucien, LCSW, CCSW-MCS [phone]  

## 2019-09-03 NOTE — Patient Instructions (Signed)
Medication Instructions:  Your physician recommends that you continue on your current medications as directed. Please refer to the Current Medication list given to you today.  *If you need a refill on your cardiac medications before your next appointment, please call your pharmacy*  Lab Work: None today If you have labs (blood work) drawn today and your tests are completely normal, you will receive your results only by: Marland Kitchen MyChart Message (if you have MyChart) OR . A paper copy in the mail If you have any lab test that is abnormal or we need to change your treatment, we will call you to review the results.  Testing/Procedures: Your physician has requested that you have an echocardiogram in August 2021 . Echocardiography is a painless test that uses sound waves to create images of your heart. It provides your doctor with information about the size and shape of your heart and how well your heart's chambers and valves are working. This procedure takes approximately one hour. There are no restrictions for this procedure.    Follow-Up: At [facility], you and your health needs are our priority.  As part of our continuing mission to provide you with exceptional heart care, we have created designated Provider Care Teams.  These Care Teams include your primary Cardiologist (physician) and Advanced Practice Providers (APPs -  Physician Assistants and Nurse Practitioners) who all work together to provide you with the care you need, when you need it.  Your next appointment:   11 months after you have your echo  The format for your next appointment:   In Person  Provider:   [name], MD  Other Instructions None

## 2019-09-13 ENCOUNTER — Ambulatory Visit (HOSPITAL_COMMUNITY): Payer: Medicare HMO

## 2019-09-14 DIAGNOSIS — R7301 Impaired fasting glucose: Secondary | ICD-10-CM | POA: Diagnosis not present

## 2019-09-14 DIAGNOSIS — Z86010 Personal history of colonic polyps: Secondary | ICD-10-CM | POA: Diagnosis not present

## 2019-09-14 DIAGNOSIS — I10 Essential (primary) hypertension: Secondary | ICD-10-CM | POA: Diagnosis not present

## 2019-09-14 DIAGNOSIS — Z8739 Personal history of other diseases of the musculoskeletal system and connective tissue: Secondary | ICD-10-CM | POA: Diagnosis not present

## 2019-09-14 DIAGNOSIS — I712 Thoracic aortic aneurysm, without rupture: Secondary | ICD-10-CM | POA: Diagnosis not present

## 2019-09-14 DIAGNOSIS — M545 Low back pain: Secondary | ICD-10-CM | POA: Diagnosis not present

## 2019-09-14 DIAGNOSIS — I480 Paroxysmal atrial fibrillation: Secondary | ICD-10-CM | POA: Diagnosis not present

## 2019-09-14 DIAGNOSIS — R945 Abnormal results of liver function studies: Secondary | ICD-10-CM | POA: Diagnosis not present

## 2019-09-14 DIAGNOSIS — E782 Mixed hyperlipidemia: Secondary | ICD-10-CM | POA: Diagnosis not present

## 2019-10-26 IMAGING — CT CT ANGIOGRAPHY CHEST
2 of 6 series · 13 of 36 positions shown · IV contrast (iopamidol)
Comparison: CT scan of February 24, 2018.

CLINICAL DATA: Thoracic aortic aneurysm.

EXAM:
CT ANGIOGRAPHY CHEST WITH CONTRAST
TECHNIQUE: Multidetector CT imaging of the chest was performed using the
standard protocol during bolus administration of intravenous
contrast. Multiplanar CT image reconstructions and MIPs were
obtained to evaluate the vascular anatomy.
CONTRAST:  75mL XCIJ76-TMK IOPAMIDOL (XCIJ76-TMK) INJECTION 76%

[Series 5: cta thorax 2.00 bv36 s3 axial arterial · axial · arterial · 0.59mm/px · z∈[+1491,+1754]mm · 12 of 158 slices shown]
[im 13/158  lung]
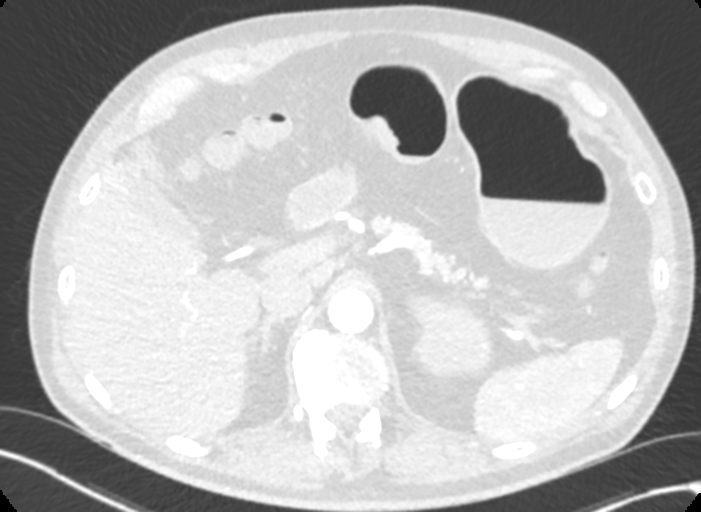
[im 25/158  mediastinal]
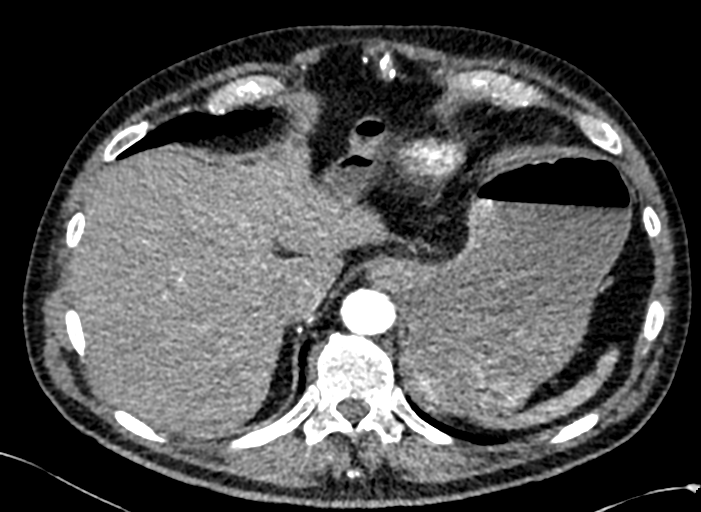
[im 37/158  lung]
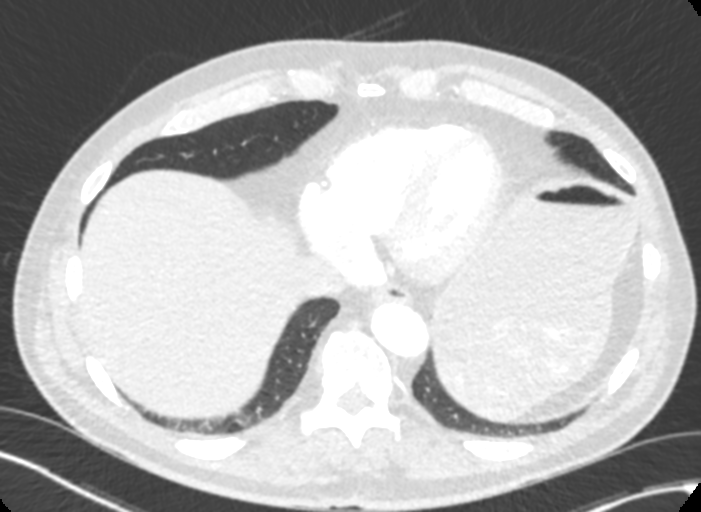
[im 49/158  mediastinal]
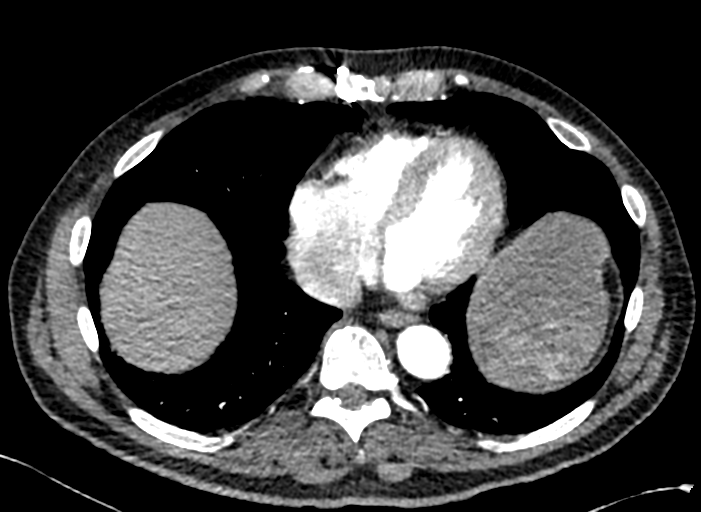
[im 61/158  lung]
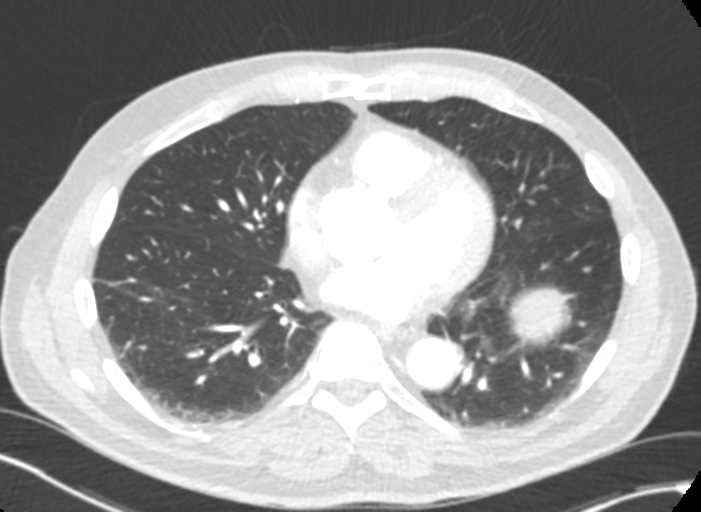
[im 73/158  mediastinal]
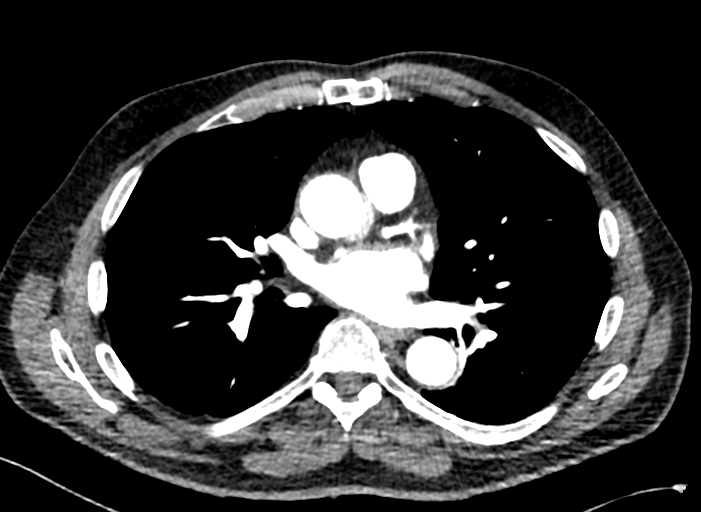
[im 85/158  lung]
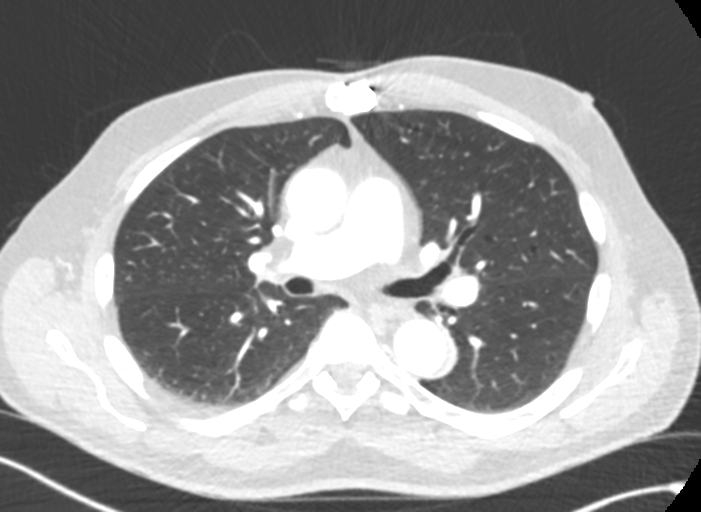
[im 97/158  mediastinal]
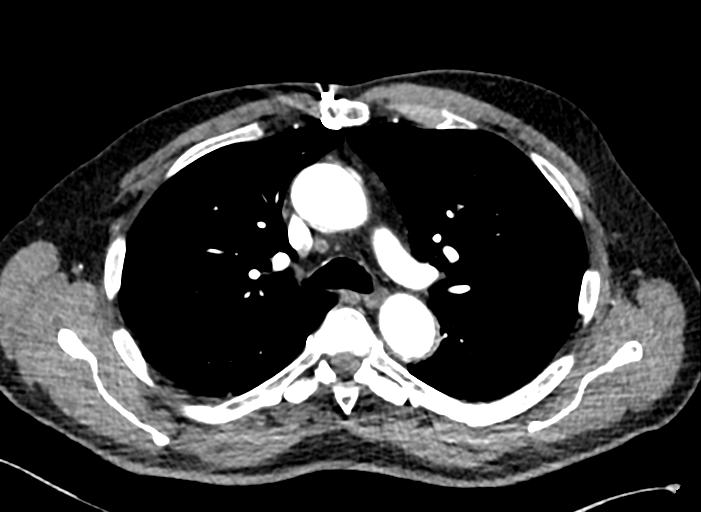
[im 109/158  lung]
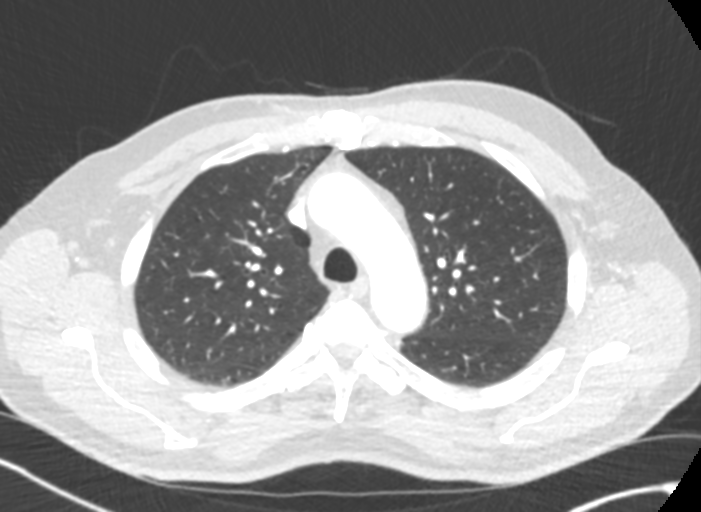
[im 121/158  mediastinal]
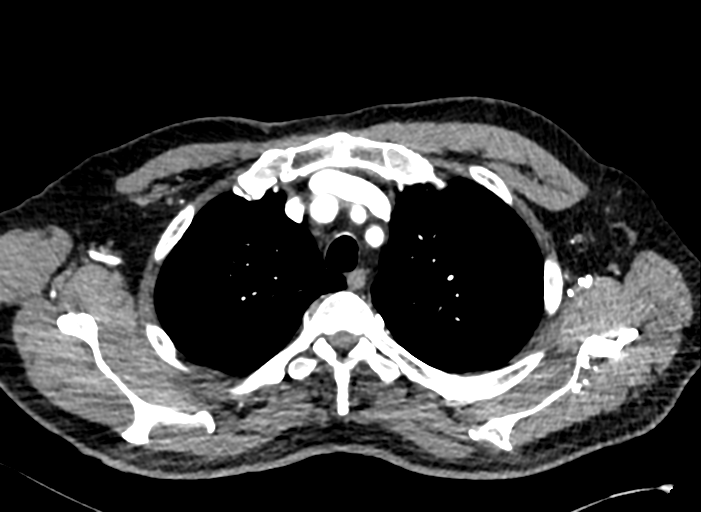
[im 133/158  lung]
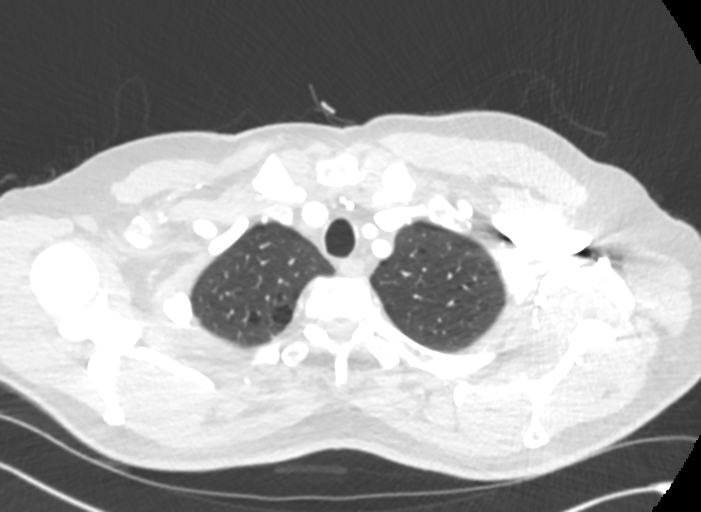
[im 145/158  mediastinal]
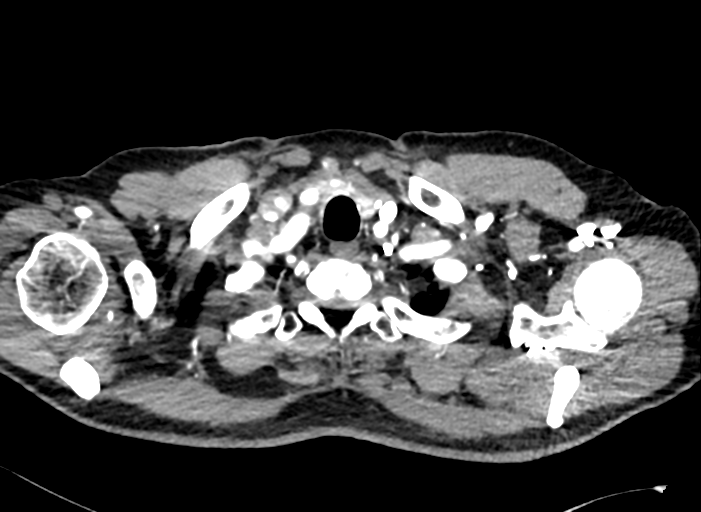

[Series 10: cta thorax 2.00 bv36 s3 cor st · coronal · 0.62mm/px · 1 of 150 slices shown]
[im 75/150  mediastinal]
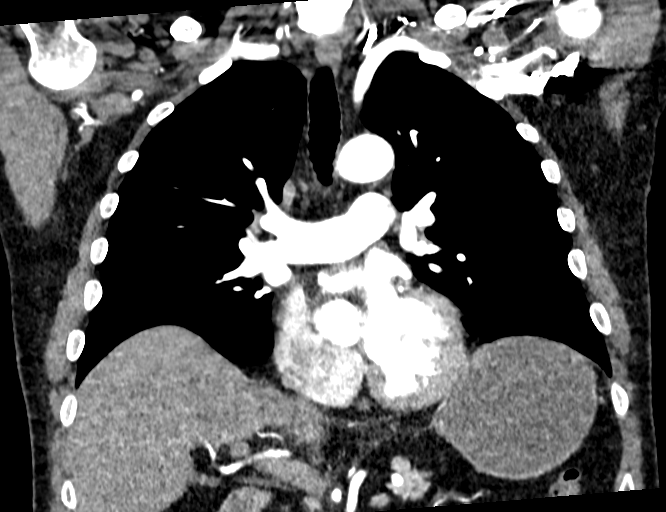

[13 of 36 positions shown; findings below may reference images not displayed]

FINDINGS: Cardiovascular: Status post surgical repair of ascending thoracic
aortic aneurysm noted on prior exam. No dissection is noted. Great
vessels are widely patent. No aneurysmal dilatation is noted
currently. Normal cardiac size. No pericardial effusion.

Mediastinum/Nodes: No enlarged mediastinal, hilar, or axillary lymph
nodes. Thyroid gland, trachea, and esophagus demonstrate no
significant findings.

Lungs/Pleura: No pneumothorax or pleural effusion is noted. Mild
emphysematous disease is noted in the upper lobes. Stable bilateral
lower lobe subsegmental atelectasis or scarring.

Upper Abdomen: No acute abnormality.

Musculoskeletal: No chest wall abnormality. No acute or significant
osseous findings.

Review of the MIP images confirms the above findings.
IMPRESSION: Status post surgical repair of ascending thoracic aortic aneurysm.
No dissection or aneurysmal dilatation is noted at this time.

Stable bibasilar subsegmental atelectasis or scarring is noted.

Emphysema ([id number]).

## 2020-03-27 DIAGNOSIS — Z0000 Encounter for general adult medical examination without abnormal findings: Secondary | ICD-10-CM | POA: Diagnosis not present

## 2020-03-27 DIAGNOSIS — R7301 Impaired fasting glucose: Secondary | ICD-10-CM | POA: Diagnosis not present

## 2020-03-27 DIAGNOSIS — I480 Paroxysmal atrial fibrillation: Secondary | ICD-10-CM | POA: Diagnosis not present

## 2020-03-27 DIAGNOSIS — I313 Pericardial effusion (noninflammatory): Secondary | ICD-10-CM | POA: Diagnosis not present

## 2020-03-27 DIAGNOSIS — I4891 Unspecified atrial fibrillation: Secondary | ICD-10-CM | POA: Diagnosis not present

## 2020-03-27 DIAGNOSIS — E782 Mixed hyperlipidemia: Secondary | ICD-10-CM | POA: Diagnosis not present

## 2020-03-27 DIAGNOSIS — J00 Acute nasopharyngitis [common cold]: Secondary | ICD-10-CM | POA: Diagnosis not present

## 2020-03-27 DIAGNOSIS — L02222 Furuncle of back [any part, except buttock]: Secondary | ICD-10-CM | POA: Diagnosis not present

## 2020-03-27 DIAGNOSIS — I712 Thoracic aortic aneurysm, without rupture: Secondary | ICD-10-CM | POA: Diagnosis not present

## 2020-03-27 DIAGNOSIS — I10 Essential (primary) hypertension: Secondary | ICD-10-CM | POA: Diagnosis not present

## 2020-03-28 DIAGNOSIS — E782 Mixed hyperlipidemia: Secondary | ICD-10-CM | POA: Diagnosis not present

## 2020-03-28 DIAGNOSIS — I480 Paroxysmal atrial fibrillation: Secondary | ICD-10-CM | POA: Diagnosis not present

## 2020-03-28 DIAGNOSIS — Z8739 Personal history of other diseases of the musculoskeletal system and connective tissue: Secondary | ICD-10-CM | POA: Diagnosis not present

## 2020-03-28 DIAGNOSIS — I712 Thoracic aortic aneurysm, without rupture: Secondary | ICD-10-CM | POA: Diagnosis not present

## 2020-03-28 DIAGNOSIS — I10 Essential (primary) hypertension: Secondary | ICD-10-CM | POA: Diagnosis not present

## 2020-03-28 DIAGNOSIS — R945 Abnormal results of liver function studies: Secondary | ICD-10-CM | POA: Diagnosis not present

## 2020-03-28 DIAGNOSIS — Z86010 Personal history of colonic polyps: Secondary | ICD-10-CM | POA: Diagnosis not present

## 2020-03-28 DIAGNOSIS — M545 Low back pain: Secondary | ICD-10-CM | POA: Diagnosis not present

## 2020-03-28 DIAGNOSIS — R7301 Impaired fasting glucose: Secondary | ICD-10-CM | POA: Diagnosis not present

## 2020-05-09 ENCOUNTER — Telehealth: Payer: Self-pay

## 2020-05-09 NOTE — Telephone Encounter (Signed)
  Patient Consent for Virtual Visit         [name] has provided verbal consent on 05/09/2020 for a virtual visit (video or telephone).   CONSENT FOR VIRTUAL VISIT FOR:  Lawrence [name]  By participating in this virtual visit I agree to the following:  I hereby voluntarily request, consent and authorize [facility] and its employed or contracted physicians, physician assistants, nurse practitioners or other licensed health care professionals (the Practitioner), to provide me with telemedicine health care services (the "Services") as deemed necessary by the treating Practitioner. I acknowledge and consent to receive the Services by the Practitioner via telemedicine. I understand that the telemedicine visit will involve communicating with the Practitioner through live audiovisual communication technology and the disclosure of certain medical information by electronic transmission. I acknowledge that I have been given the opportunity to request an in-person assessment or other available alternative prior to the telemedicine visit and am voluntarily participating in the telemedicine visit.  I understand that I have the right to withhold or withdraw my consent to the use of telemedicine in the course of my care at any time, without affecting my right to future care or treatment, and that the Practitioner or I may terminate the telemedicine visit at any time. I understand that I have the right to inspect all information obtained and/or recorded in the course of the telemedicine visit and may receive copies of available information for a reasonable fee.  I understand that some of the potential risks of receiving the Services via telemedicine include:  Marland Kitchen Delay or interruption in medical evaluation due to technological equipment failure or disruption; . Information transmitted may not be sufficient (e.g. poor resolution of images) to allow for appropriate medical decision making by the Practitioner;  and/or  . In rare instances, security protocols could fail, causing a breach of personal health information.  Furthermore, I acknowledge that it is my responsibility to provide information about my medical history, conditions and care that is complete and accurate to the best of my ability. I acknowledge that Practitioner's advice, recommendations, and/or decision may be based on factors not within their control, such as incomplete or inaccurate data provided by me or distortions of diagnostic images or specimens that may result from electronic transmissions. I understand that the practice of medicine is not an exact science and that Practitioner makes no warranties or guarantees regarding treatment outcomes. I acknowledge that a copy of this consent can be made available to me via my patient portal ([place]), or I can request a printed copy by calling the office of [place] [facility].    I understand that my insurance will be billed for this visit.   I have read or had this consent read to me. . I understand the contents of this consent, which adequately explains the benefits and risks of the Services being provided via telemedicine.  . I have been provided ample opportunity to ask questions regarding this consent and the Services and have had my questions answered to my satisfaction. . I give my informed consent for the services to be provided through the use of telemedicine in my medical care

## 2020-05-10 ENCOUNTER — Other Ambulatory Visit: Payer: Self-pay

## 2020-05-10 ENCOUNTER — Telehealth (INDEPENDENT_AMBULATORY_CARE_PROVIDER_SITE_OTHER): Payer: Medicare HMO | Admitting: Cardiology

## 2020-05-10 ENCOUNTER — Encounter: Payer: Self-pay | Admitting: Cardiology

## 2020-05-10 VITALS — BP 128/79 | HR 67 | Ht 70.0 in | Wt 212.0 lb

## 2020-05-10 DIAGNOSIS — Q231 Congenital insufficiency of aortic valve: Secondary | ICD-10-CM | POA: Diagnosis not present

## 2020-05-10 DIAGNOSIS — I10 Essential (primary) hypertension: Secondary | ICD-10-CM

## 2020-05-10 NOTE — Patient Instructions (Addendum)
Medication Instructions:    Your physician recommends that you continue on your current medications as directed. Please refer to the Current Medication list given to you today.  Labwork:  NONE  Testing/Procedures: Your physician has requested that you have an echocardiogram in 6 months just befoe your next vist. Echocardiography is a painless test that uses sound waves to create images of your heart. It provides your doctor with information about the size and shape of your heart and how well your heart's chambers and valves are working. This procedure takes approximately one hour. There are no restrictions for this procedure.  Follow-Up:  Your physician recommends that you schedule a follow-up appointment in: 6 months (office). You will receive a reminder letter in the mail in about 4 months reminding you to call and schedule your appointment. If you don't receive this letter, please contact our office.  Any Other Special Instructions Will Be Listed Below (If Applicable).  If you need a refill on your cardiac medications before your next appointment, please call your pharmacy.

## 2020-05-10 NOTE — Progress Notes (Signed)
Virtual Visit via Telephone Note   This visit type was conducted due to national recommendations for restrictions regarding the COVID-19 Pandemic (e.g. social distancing) in an effort to limit this patient's exposure and mitigate transmission in our community.  Due to his co-morbid illnesses, this patient is at least at moderate risk for complications without adequate follow up.  This format is felt to be most appropriate for this patient at this time.  The patient did not have access to video technology/had technical difficulties with video requiring transitioning to audio format only (telephone).  All issues noted in this document were discussed and addressed.  No physical exam could be performed with this format.  Please refer to the patient's chart for his  consent to telehealth for [facility].   The patient was identified using 2 identifiers.  Date:  05/10/2020   ID:  Lawrence [name], DOB [birth year], MRN [id number]  Patient Location: Home Provider Location: Office  PCP:  [name], MD  Cardiologist:  [name], MD Electrophysiologist:  None   Evaluation Performed:  Follow-Up Visit  Chief Complaint:   Cardiac follow-up  History of Present Illness:    Lawrence [name] is a 69 y.o. male last assessed via telehealth encounter in October 2020.  We spoke by phone today.  He does not report any exertional chest pain, no palpitations or dizziness.  He is no longer working at [employer], now maintains 6 lawns.  Reports good stamina, no progressive shortness of breath.  I reviewed his medications which are outlined below.  He reports no intolerances.  He is due for a follow-up echocardiogram.  Last study was in 2019 as noted below.  I reviewed his recent lab work from May as outlined below.   Past Medical History:  Diagnosis Date  . Aortic atherosclerosis ([place])   . Aortic insufficiency   . Aortic stenosis   . Arthritis   . Bicuspid aortic valve    a. mild AS, mild-mod AI  by echo 03/2017, 5cm thoracic aortic aneurysm by CT 07/2017.  . Essential hypertension   . GERD (gastroesophageal reflux disease)   . Gout   . Postoperative atrial fibrillation ([place])   . Thoracic aortic aneurysm ([place])    a. 07/2017: 5cm by CT.   Past Surgical History:  Procedure Laterality Date  . ASCENDING AORTIC ROOT REPLACEMENT N/A 07/20/2018   Procedure: REPLACMENT OF ASCENDING AORTA WITH 32MM GRAFT. HYPOTHERMIC CIRCULATORY ARREST. WHEAT PROCEDURE.;  Surgeon: [name], MD;  Location: [place];  Service: Open Heart Surgery;  Laterality: N/A;  . COLONOSCOPY N/A 08/29/2016   Procedure: COLONOSCOPY;  Surgeon: Danie Binder, MD;  [facility];  Service: Endoscopy;  Laterality: N/A;  1030  . COLONOSCOPY N/A 03/09/2018   Procedure: COLONOSCOPY;  Surgeon: Danie Binder, MD;  [facility];  Service: Endoscopy;  Laterality: N/A;  1:15pm  . HERNIA REPAIR     umbilical  . MASS EXCISION N/A 03/18/2018   Procedure: EXCISION CYST ON BACK 2CM;  Surgeon: [name], MD;  Location: [facility];  Service: General;  Laterality: N/A;  . POLYPECTOMY  03/09/2018   Procedure: POLYPECTOMY;  Surgeon: Danie Binder, MD;  [facility];  Service: Endoscopy;;  ascending, transverse x2; descending, sigmoid  . RIGHT/LEFT HEART CATH AND CORONARY ANGIOGRAPHY N/A 09/05/2017   Procedure: RIGHT/LEFT HEART CATH AND CORONARY ANGIOGRAPHY;  Surgeon: Larey Dresser, MD;  Location: [facility];  Service: Cardiovascular;  Laterality:  N/A;  . TEE WITHOUT CARDIOVERSION N/A 07/20/2018   Procedure: TRANSESOPHAGEAL ECHOCARDIOGRAM (TEE);  Surgeon: [name], MD;  Location: [place];  Service: Open Heart Surgery;  Laterality: N/A;     Current Meds  Medication Sig  . acetaminophen (TYLENOL) 500 MG tablet Take 500 mg by mouth every 6 (six) hours as needed.  Marland Kitchen atorvastatin (LIPITOR) 20 MG tablet Take 1 tablet by mouth daily.  Marland Kitchen diltiazem (CARDIZEM CD) 120 MG 24 hr capsule Take 1  capsule (120 mg total) by mouth daily.  . metoprolol tartrate (LOPRESSOR) 25 MG tablet Take 1 tablet (25 mg total) by mouth 2 (two) times daily. (Patient taking differently: Take 25 mg by mouth daily. )     Allergies:   Ciprofloxacin and Morphine and related   ROS:   Occasional arthritic symptoms.  Prior CV studies:   The following studies were reviewed today:  Echocardiogram 08/10/2018: Study Conclusions   - Left ventricle: The cavity size was normal. Wall thickness was  increased in a pattern of moderate LVH. Systolic function was  normal. The estimated ejection fraction was in the range of 55%  to 60%. Diastolic function is abnormal, indeterminant grade. Wall  motion was normal; there were no regional wall motion  abnormalities.  - Aortic valve: Mildly to moderately calcified annulus. Mildly  thickened leaflets. There was mild regurgitation. Valve area  (VTI): 2.84 cm^2. Valve area (Vmax): 2.77 cm^2.  - Aorta: History of supracoronary replacement of ascending aorta  with 32 mm Hemashield graft 07/21/2018. Aortic root dimension: 43  mm (ED).  - Aortic root: The aortic root was mildly dilated.  - Mitral valve: Mildly calcified annulus. Normal thickness leaflets.  - Pericardium, extracardiac: Small circumferential pericardial  effusion. Effusion is notably smaller compared to 08/04/18 study.  - Technically adequate study.   Labs/Other Tests and Data Reviewed:    EKG:  An ECG dated 08/13/2018 was personally reviewed today and demonstrated:  Sinus rhythm with left atrial enlargement, left anterior fascicular block, nonspecific T wave abnormalities.  Recent Labs:  May 2021: Hemoglobin 15.8, platelets 198, BUN 26, creatinine 1.02, potassium 4.9, AST 24, ALT 25, cholesterol 114, triglycerides 64, HDL 42, LDL 58, hemoglobin A1c 6.1%  Wt Readings from Last 3 Encounters:  05/10/20 212 lb (96.2 kg)  09/03/19 214 lb (97.1 kg)  07/08/19 215 lb (97.5 kg)      Objective:    Vital Signs:  BP 128/79   Pulse 67   Ht 5\' 10"  (1.778 m)   Wt 212 lb (96.2 kg)   BMI 30.42 kg/m    Patient spoke in full sentences, not short of breath.  ASSESSMENT & PLAN:    1.  Ascending aortic aneurysm status post supra coronary replacement of the ascending aorta, 32 mm Hemashield graft back in September 2019 with Dr. Servando Snare.  He is asymptomatic at this time.  2.  Bicuspid aortic valve, last echocardiogram was in 2019.  Mild aortic regurgitation as well.  Follow-up echocardiogram will be obtained prior to his next visit.  3.  Essential hypertension, blood pressure is reasonable today.  No changes in regimen which currently includes Lopressor and Cardizem CD.   Time:   Today, I have spent 8 minutes with the patient with telehealth technology discussing the above problems.     Medication Adjustments/Labs and Tests Ordered: Current medicines are reviewed at length with the patient today.  Concerns regarding medicines are outlined above.   Tests Ordered: Orders Placed This Encounter  Procedures  .  ECHOCARDIOGRAM COMPLETE    Medication Changes: No orders of the defined types were placed in this encounter.   Follow Up:  In Person 6 months in the [place] office.  Signed, [name], MD  05/10/2020 10:21 AM    [place]

## 2020-06-23 DIAGNOSIS — S81812A Laceration without foreign body, left lower leg, initial encounter: Secondary | ICD-10-CM | POA: Diagnosis not present

## 2020-06-23 DIAGNOSIS — N529 Male erectile dysfunction, unspecified: Secondary | ICD-10-CM | POA: Diagnosis not present

## 2020-08-10 ENCOUNTER — Other Ambulatory Visit: Payer: Self-pay

## 2020-08-10 ENCOUNTER — Encounter: Payer: Self-pay | Admitting: Cardiothoracic Surgery

## 2020-08-10 ENCOUNTER — Ambulatory Visit (INDEPENDENT_AMBULATORY_CARE_PROVIDER_SITE_OTHER): Payer: Medicare HMO | Admitting: Cardiothoracic Surgery

## 2020-08-10 VITALS — BP 149/84 | HR 67 | Temp 97.5°F | Resp 20 | Ht 70.0 in | Wt 205.6 lb

## 2020-08-10 DIAGNOSIS — I712 Thoracic aortic aneurysm, without rupture, unspecified: Secondary | ICD-10-CM

## 2020-08-10 NOTE — Progress Notes (Signed)
[address]       [place] [address]             [phone]                  Lawrence [name] Medical Record #[id number] Date of Birth: [birth year]  Referring HU:[id number], Aloha Gell, MD Primary Cardiology: Primary Care:Hall, Edwinna Areola, MD  Chief Complaint:  Follow Up Visit OPERATIVE REPORT DATE OF PROCEDURE:  07/20/2018 PREOPERATIVE DIAGNOSIS:  Dilated ascending aorta and bicuspid aortic valve. 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 SURGICAL PROCEDURE:  Replacement of ascending aorta supracoronary with 32 mm Hemashield graft with hypothermic circulatory arrest and antegrade cerebral perfusion. [place] Procedure SURGEON:  Lanelle Bal, MD  History of Present Illness:      Patient returns to the office in follow-up after  replacement of his ascending aorta with preservation of his aortic valve  In  September 2019 .  His  postoperative course after discharge was complicated by a brief readmission because of atrial fibrillation.  Patient's had no further known atrial fibrillation.   Since last seen the patient has returned to near normal activities, the past summer mowing a yard maintenance business.  He has stopped currently working at [employer].  He has decided not to get a Covid vaccination   We reviewed recommendations for endocarditis prophylaxis with artificial graft material and bicuspid aortic valve. Patient has full set of dentures   Zubrod Score: At the time of surgery this patient's most appropriate activity status/level should be described as: [x]     0    Normal activity, no symptoms []     1    Restricted in physical strenuous activity but ambulatory, able to do out light work []     2    Ambulatory and capable of self care, unable to do work activities, up and about                 >50 % of waking hours                                                                                   []     3    Only  limited self care, in bed greater than 50% of waking hours []     4    Completely disabled, no self care, confined to bed or chair []     5    Moribund  Social History   Tobacco Use  Smoking Status Former Smoker  . Packs/day: 2.50  . Years: 25.00  . Pack years: 62.50  . Types: Cigarettes  . Quit date: 08/01/1981  . Years since quitting: 39.0  Smokeless Tobacco Never Used       Allergies  Allergen Reactions  . Ciprofloxacin   . Morphine And Related Other (See Comments)    Caused patient to pass out.     Current Outpatient Medications  Medication Sig Dispense Refill  . acetaminophen (TYLENOL) 500 MG tablet Take 500 mg by mouth every 6 (six) hours as needed.    Marland Kitchen atorvastatin (LIPITOR) 20 MG tablet Take 1 tablet by mouth  daily.    . diltiazem (CARDIZEM CD) 120 MG 24 hr capsule Take 1 capsule (120 mg total) by mouth daily. 90 capsule 4  . metoprolol tartrate (LOPRESSOR) 25 MG tablet Take 1 tablet (25 mg total) by mouth 2 (two) times daily. (Patient taking differently: Take 25 mg by mouth daily. ) 180 tablet 4   No current facility-administered medications for this visit.       Physical Exam: BP (!) 149/84   Pulse 67   Temp (!) 97.5 F (36.4 C)   Resp 20   Ht 5\' 10"  (1.778 m)   Wt 205 lb 9.6 oz (93.3 kg)   SpO2 95%   BMI 29.50 kg/m  General appearance: alert, cooperative and no distress Head: Normocephalic, without obvious abnormality, atraumatic Neck: no adenopathy, no carotid bruit, no JVD, supple, symmetrical, trachea midline and thyroid not enlarged, symmetric, no tenderness/mass/nodules Lymph nodes: Cervical, supraclavicular, and axillary nodes normal. Resp: clear to auscultation bilaterally Cardio: regular rate and rhythm, S1, S2 normal, no murmur, click, rub or gallop GI: soft, non-tender; bowel sounds normal; no masses,  no organomegaly Extremities: extremities normal, atraumatic, no cyanosis or edema and Homans sign is negative, no sign of DVT Neurologic:  Grossly normal  Diagnostic Studies & Laboratory data:    ECHO  Post op 08/10/2018 Study Conclusions   - Left ventricle: The cavity size was normal. Wall thickness was  increased in a pattern of moderate LVH. Systolic function was  normal. The estimated ejection fraction was in the range of 55%  to 60%. Diastolic function is abnormal, indeterminant grade. Wall  motion was normal; there were no regional wall motion  abnormalities.  - Aortic valve: Mildly to moderately calcified annulus. Mildly  thickened leaflets. There was mild regurgitation. Valve area  (VTI): 2.84 cm^2. Valve area (Vmax): 2.77 cm^2.  - Aorta: History of supracoronary replacement of ascending aorta  with 32 mm Hemashield graft 07/21/2018. Aortic root dimension: 43  mm (ED).  - Aortic root: The aortic root was mildly dilated.  - Mitral valve: Mildly calcified annulus. Normal thickness leaflets  .  - Pericardium, extracardiac: Small circumferential pericardial  effusion. Effusion is notably smaller compared to 08/04/18 study.  - Technically adequate study.        CLINICAL DATA:  Thoracic aortic aneurysm.  EXAM: CT ANGIOGRAPHY CHEST WITH CONTRAST  TECHNIQUE: Multidetector CT imaging of the chest was performed using the standard protocol during bolus administration of intravenous contrast. Multiplanar CT image reconstructions and MIPs were obtained to evaluate the vascular anatomy.  CONTRAST:  86mL ISOVUE-370 IOPAMIDOL (ISOVUE-370) INJECTION 76%  COMPARISON:  CT scan of February 24, 2018.  FINDINGS: Cardiovascular: Status post surgical repair of ascending thoracic aortic aneurysm noted on prior exam. No dissection is noted. Great vessels are widely patent. No aneurysmal dilatation is noted currently. Normal cardiac size. No pericardial effusion.  Mediastinum/Nodes: No enlarged mediastinal, hilar, or axillary lymph nodes. Thyroid gland, trachea, and esophagus demonstrate  no significant findings.  Lungs/Pleura: No pneumothorax or pleural effusion is noted. Mild emphysematous disease is noted in the upper lobes. Stable bilateral lower lobe subsegmental atelectasis or scarring.  Upper Abdomen: No acute abnormality.  Musculoskeletal: No chest wall abnormality. No acute or significant osseous findings.  Review of the MIP images confirms the above findings.  IMPRESSION: Status post surgical repair of ascending thoracic aortic aneurysm. No dissection or aneurysmal dilatation is noted at this time.  Stable bibasilar subsegmental atelectasis or scarring is noted.  Emphysema (ICD10-J43.9).   Electronically Signed  By: Marijo Conception M.D.   On: 07/08/2019 12:59    I have independently reviewed the above radiology studies  and reviewed the findings with the patient.   Recent Labs: Lab Results  Component Value Date   WBC 7.1 08/04/2018   HGB 13.1 08/04/2018   HCT 39.6 08/04/2018   PLT 526 (H) 08/04/2018   GLUCOSE 117 (H) 08/05/2018   CHOL 193 09/02/2017   TRIG 133 09/02/2017   HDL 37 (L) 09/02/2017   LDLCALC 129 (H) 09/02/2017   ALT 71 (H) 08/05/2018   AST 36 08/05/2018   NA 137 08/05/2018   K 4.3 08/05/2018   CL 105 08/05/2018   CREATININE 0.98 08/05/2018   BUN 18 08/05/2018   CO2 24 08/05/2018   TSH 0.970 08/05/2018   INR 1.71 07/20/2018   HGBA1C 6.0 (H) 07/16/2018      Assessment / Plan:   #1 status post replacement of ascending aorta with preservation of the patient's bicuspid aortic valve-he appears to be doing well postoperatively without evidence of congestive heart failure or angina.  CT scan done last year showed intact repair, patient has follow-ups echocardiogram scheduled for December 2021.  Plan follow-up CTA of the chest in 1 year  ECHOcardiogram scheduled for 10/2020    [name] 08/13/2020 8:24 PM

## 2020-09-28 DIAGNOSIS — R42 Dizziness and giddiness: Secondary | ICD-10-CM | POA: Diagnosis not present

## 2020-09-28 DIAGNOSIS — Z0000 Encounter for general adult medical examination without abnormal findings: Secondary | ICD-10-CM | POA: Diagnosis not present

## 2020-09-28 DIAGNOSIS — Z125 Encounter for screening for malignant neoplasm of prostate: Secondary | ICD-10-CM | POA: Diagnosis not present

## 2020-09-28 DIAGNOSIS — Z712 Person consulting for explanation of examination or test findings: Secondary | ICD-10-CM | POA: Diagnosis not present

## 2020-09-28 DIAGNOSIS — I10 Essential (primary) hypertension: Secondary | ICD-10-CM | POA: Diagnosis not present

## 2020-09-28 DIAGNOSIS — Z6829 Body mass index (BMI) 29.0-29.9, adult: Secondary | ICD-10-CM | POA: Diagnosis not present

## 2020-09-28 DIAGNOSIS — Z8739 Personal history of other diseases of the musculoskeletal system and connective tissue: Secondary | ICD-10-CM | POA: Diagnosis not present

## 2020-09-28 DIAGNOSIS — L02232 Carbuncle of back [any part, except buttock]: Secondary | ICD-10-CM | POA: Diagnosis not present

## 2020-09-28 DIAGNOSIS — R945 Abnormal results of liver function studies: Secondary | ICD-10-CM | POA: Diagnosis not present

## 2020-09-28 DIAGNOSIS — R7301 Impaired fasting glucose: Secondary | ICD-10-CM | POA: Diagnosis not present

## 2020-09-28 DIAGNOSIS — I712 Thoracic aortic aneurysm, without rupture: Secondary | ICD-10-CM | POA: Diagnosis not present

## 2020-09-28 DIAGNOSIS — E782 Mixed hyperlipidemia: Secondary | ICD-10-CM | POA: Diagnosis not present

## 2020-09-28 DIAGNOSIS — I480 Paroxysmal atrial fibrillation: Secondary | ICD-10-CM | POA: Diagnosis not present

## 2020-09-28 DIAGNOSIS — Z86010 Personal history of colonic polyps: Secondary | ICD-10-CM | POA: Diagnosis not present

## 2020-10-03 DIAGNOSIS — I480 Paroxysmal atrial fibrillation: Secondary | ICD-10-CM | POA: Diagnosis not present

## 2020-10-03 DIAGNOSIS — R945 Abnormal results of liver function studies: Secondary | ICD-10-CM | POA: Diagnosis not present

## 2020-10-03 DIAGNOSIS — I10 Essential (primary) hypertension: Secondary | ICD-10-CM | POA: Diagnosis not present

## 2020-10-03 DIAGNOSIS — M545 Low back pain, unspecified: Secondary | ICD-10-CM | POA: Diagnosis not present

## 2020-10-03 DIAGNOSIS — Z86010 Personal history of colonic polyps: Secondary | ICD-10-CM | POA: Diagnosis not present

## 2020-10-03 DIAGNOSIS — R7301 Impaired fasting glucose: Secondary | ICD-10-CM | POA: Diagnosis not present

## 2020-10-03 DIAGNOSIS — Z8739 Personal history of other diseases of the musculoskeletal system and connective tissue: Secondary | ICD-10-CM | POA: Diagnosis not present

## 2020-10-03 DIAGNOSIS — Z0001 Encounter for general adult medical examination with abnormal findings: Secondary | ICD-10-CM | POA: Diagnosis not present

## 2020-10-03 DIAGNOSIS — E782 Mixed hyperlipidemia: Secondary | ICD-10-CM | POA: Diagnosis not present

## 2020-10-03 DIAGNOSIS — I712 Thoracic aortic aneurysm, without rupture: Secondary | ICD-10-CM | POA: Diagnosis not present

## 2020-10-18 DIAGNOSIS — U071 COVID-19: Secondary | ICD-10-CM | POA: Diagnosis not present

## 2020-10-23 DIAGNOSIS — U071 COVID-19: Secondary | ICD-10-CM | POA: Diagnosis not present

## 2020-10-25 ENCOUNTER — Inpatient Hospital Stay (HOSPITAL_COMMUNITY)
Admission: EM | Admit: 2020-10-25 | Discharge: 2020-12-12 | DRG: 177 | Disposition: E | Payer: Medicare HMO | Attending: Family Medicine | Admitting: Family Medicine

## 2020-10-25 ENCOUNTER — Encounter (HOSPITAL_COMMUNITY): Payer: Self-pay | Admitting: Emergency Medicine

## 2020-10-25 ENCOUNTER — Emergency Department (HOSPITAL_COMMUNITY): Payer: Medicare HMO

## 2020-10-25 ENCOUNTER — Other Ambulatory Visit: Payer: Self-pay

## 2020-10-25 DIAGNOSIS — R0602 Shortness of breath: Secondary | ICD-10-CM | POA: Diagnosis not present

## 2020-10-25 DIAGNOSIS — Q2543 Congenital aneurysm of aorta: Secondary | ICD-10-CM | POA: Diagnosis present

## 2020-10-25 DIAGNOSIS — D6859 Other primary thrombophilia: Secondary | ICD-10-CM | POA: Diagnosis not present

## 2020-10-25 DIAGNOSIS — I480 Paroxysmal atrial fibrillation: Secondary | ICD-10-CM | POA: Diagnosis not present

## 2020-10-25 DIAGNOSIS — J9690 Respiratory failure, unspecified, unspecified whether with hypoxia or hypercapnia: Secondary | ICD-10-CM | POA: Diagnosis not present

## 2020-10-25 DIAGNOSIS — Z8739 Personal history of other diseases of the musculoskeletal system and connective tissue: Secondary | ICD-10-CM | POA: Diagnosis not present

## 2020-10-25 DIAGNOSIS — I2699 Other pulmonary embolism without acute cor pulmonale: Secondary | ICD-10-CM | POA: Diagnosis not present

## 2020-10-25 DIAGNOSIS — I10 Essential (primary) hypertension: Secondary | ICD-10-CM | POA: Diagnosis present

## 2020-10-25 DIAGNOSIS — R079 Chest pain, unspecified: Secondary | ICD-10-CM | POA: Diagnosis not present

## 2020-10-25 DIAGNOSIS — R739 Hyperglycemia, unspecified: Secondary | ICD-10-CM | POA: Diagnosis not present

## 2020-10-25 DIAGNOSIS — I352 Nonrheumatic aortic (valve) stenosis with insufficiency: Secondary | ICD-10-CM | POA: Diagnosis present

## 2020-10-25 DIAGNOSIS — R9431 Abnormal electrocardiogram [ECG] [EKG]: Secondary | ICD-10-CM | POA: Diagnosis present

## 2020-10-25 DIAGNOSIS — Z66 Do not resuscitate: Secondary | ICD-10-CM | POA: Diagnosis not present

## 2020-10-25 DIAGNOSIS — Z86010 Personal history of colonic polyps: Secondary | ICD-10-CM | POA: Diagnosis not present

## 2020-10-25 DIAGNOSIS — I712 Thoracic aortic aneurysm, without rupture, unspecified: Secondary | ICD-10-CM | POA: Diagnosis present

## 2020-10-25 DIAGNOSIS — M1990 Unspecified osteoarthritis, unspecified site: Secondary | ICD-10-CM | POA: Diagnosis present

## 2020-10-25 DIAGNOSIS — I700 Atherosclerosis of aorta: Secondary | ICD-10-CM | POA: Diagnosis present

## 2020-10-25 DIAGNOSIS — M109 Gout, unspecified: Secondary | ICD-10-CM | POA: Diagnosis present

## 2020-10-25 DIAGNOSIS — U071 COVID-19: Principal | ICD-10-CM | POA: Diagnosis present

## 2020-10-25 DIAGNOSIS — D696 Thrombocytopenia, unspecified: Secondary | ICD-10-CM

## 2020-10-25 DIAGNOSIS — Z885 Allergy status to narcotic agent status: Secondary | ICD-10-CM

## 2020-10-25 DIAGNOSIS — R112 Nausea with vomiting, unspecified: Secondary | ICD-10-CM | POA: Diagnosis present

## 2020-10-25 DIAGNOSIS — J9601 Acute respiratory failure with hypoxia: Secondary | ICD-10-CM | POA: Diagnosis present

## 2020-10-25 DIAGNOSIS — K219 Gastro-esophageal reflux disease without esophagitis: Secondary | ICD-10-CM | POA: Diagnosis present

## 2020-10-25 DIAGNOSIS — I719 Aortic aneurysm of unspecified site, without rupture: Secondary | ICD-10-CM | POA: Diagnosis present

## 2020-10-25 DIAGNOSIS — Z800 Family history of malignant neoplasm of digestive organs: Secondary | ICD-10-CM

## 2020-10-25 DIAGNOSIS — R059 Cough, unspecified: Secondary | ICD-10-CM | POA: Diagnosis not present

## 2020-10-25 DIAGNOSIS — R945 Abnormal results of liver function studies: Secondary | ICD-10-CM | POA: Diagnosis not present

## 2020-10-25 DIAGNOSIS — R339 Retention of urine, unspecified: Secondary | ICD-10-CM | POA: Diagnosis not present

## 2020-10-25 DIAGNOSIS — R0902 Hypoxemia: Secondary | ICD-10-CM

## 2020-10-25 DIAGNOSIS — R55 Syncope and collapse: Secondary | ICD-10-CM | POA: Diagnosis present

## 2020-10-25 DIAGNOSIS — F411 Generalized anxiety disorder: Secondary | ICD-10-CM | POA: Diagnosis not present

## 2020-10-25 DIAGNOSIS — M545 Low back pain, unspecified: Secondary | ICD-10-CM | POA: Diagnosis not present

## 2020-10-25 DIAGNOSIS — E785 Hyperlipidemia, unspecified: Secondary | ICD-10-CM | POA: Diagnosis present

## 2020-10-25 DIAGNOSIS — R7301 Impaired fasting glucose: Secondary | ICD-10-CM | POA: Diagnosis not present

## 2020-10-25 DIAGNOSIS — T380X5A Adverse effect of glucocorticoids and synthetic analogues, initial encounter: Secondary | ICD-10-CM | POA: Diagnosis not present

## 2020-10-25 DIAGNOSIS — Z87891 Personal history of nicotine dependence: Secondary | ICD-10-CM

## 2020-10-25 DIAGNOSIS — I444 Left anterior fascicular block: Secondary | ICD-10-CM | POA: Diagnosis present

## 2020-10-25 DIAGNOSIS — R042 Hemoptysis: Secondary | ICD-10-CM | POA: Diagnosis not present

## 2020-10-25 DIAGNOSIS — Z515 Encounter for palliative care: Secondary | ICD-10-CM

## 2020-10-25 DIAGNOSIS — J189 Pneumonia, unspecified organism: Secondary | ICD-10-CM | POA: Diagnosis not present

## 2020-10-25 DIAGNOSIS — Z9119 Patient's noncompliance with other medical treatment and regimen: Secondary | ICD-10-CM

## 2020-10-25 DIAGNOSIS — I482 Chronic atrial fibrillation, unspecified: Secondary | ICD-10-CM | POA: Diagnosis present

## 2020-10-25 DIAGNOSIS — J1282 Pneumonia due to coronavirus disease 2019: Secondary | ICD-10-CM | POA: Diagnosis not present

## 2020-10-25 DIAGNOSIS — D6869 Other thrombophilia: Secondary | ICD-10-CM | POA: Diagnosis present

## 2020-10-25 DIAGNOSIS — Z7189 Other specified counseling: Secondary | ICD-10-CM | POA: Diagnosis not present

## 2020-10-25 DIAGNOSIS — I7121 Aneurysm of the ascending aorta, without rupture: Secondary | ICD-10-CM | POA: Diagnosis present

## 2020-10-25 DIAGNOSIS — E782 Mixed hyperlipidemia: Secondary | ICD-10-CM | POA: Diagnosis not present

## 2020-10-25 DIAGNOSIS — Z881 Allergy status to other antibiotic agents status: Secondary | ICD-10-CM

## 2020-10-25 DIAGNOSIS — Z8249 Family history of ischemic heart disease and other diseases of the circulatory system: Secondary | ICD-10-CM

## 2020-10-25 DIAGNOSIS — R7989 Other specified abnormal findings of blood chemistry: Secondary | ICD-10-CM | POA: Diagnosis not present

## 2020-10-25 DIAGNOSIS — R040 Epistaxis: Secondary | ICD-10-CM | POA: Diagnosis not present

## 2020-10-25 DIAGNOSIS — Z8371 Family history of colonic polyps: Secondary | ICD-10-CM

## 2020-10-25 LAB — BASIC METABOLIC PANEL
Anion gap: 14 (ref 5–15)
BUN: 39 mg/dL — ABNORMAL HIGH (ref 8–23)
CO2: 24 mmol/L (ref 22–32)
Calcium: 9.4 mg/dL (ref 8.9–10.3)
Chloride: 97 mmol/L — ABNORMAL LOW (ref 98–111)
Creatinine, Ser: 1.22 mg/dL (ref 0.61–1.24)
GFR, Estimated: >60 mL/min (ref >60)
Glucose, Bld: 219 mg/dL — ABNORMAL HIGH (ref 70–99)
Potassium: 4.2 mmol/L (ref 3.5–5.1)
Sodium: 135 mmol/L (ref 135–145)

## 2020-10-25 LAB — CBC
HCT: 57.8 % — ABNORMAL HIGH (ref 39.0–52.0)
Hemoglobin: 19.5 g/dL — ABNORMAL HIGH (ref 13.0–17.0)
MCH: 31.5 pg (ref 26.0–34.0)
MCHC: 33.7 g/dL (ref 30.0–36.0)
MCV: 93.4 fL (ref 80.0–100.0)
Platelets: 160 10*3/uL (ref 150–400)
RBC: 6.19 MIL/uL — ABNORMAL HIGH (ref 4.22–5.81)
RDW: 13 % (ref 11.5–15.5)
WBC: 8 10*3/uL (ref 4.0–10.5)
nRBC: 0 % (ref 0.0–0.2)

## 2020-10-25 LAB — FIBRINOGEN: Fibrinogen: 620 mg/dL — ABNORMAL HIGH (ref 210–475)

## 2020-10-25 LAB — RESP PANEL BY RT-PCR (RSV, FLU A&B, COVID)  RVPGX2
Influenza A by PCR: NEGATIVE
Influenza B by PCR: NEGATIVE
Resp Syncytial Virus by PCR: NEGATIVE
SARS Coronavirus 2 by RT PCR: POSITIVE — AB

## 2020-10-25 LAB — CBG MONITORING, ED: Glucose-Capillary: 190 mg/dL — ABNORMAL HIGH (ref 70–99)

## 2020-10-25 LAB — URINALYSIS, ROUTINE W REFLEX MICROSCOPIC
Bilirubin Urine: NEGATIVE
Glucose, UA: 50 mg/dL — AB
Hgb urine dipstick: NEGATIVE
Ketones, ur: 20 mg/dL — AB
Leukocytes,Ua: NEGATIVE
Nitrite: NEGATIVE
Protein, ur: 100 mg/dL — AB
Specific Gravity, Urine: 1.032 — ABNORMAL HIGH (ref 1.005–1.030)
pH: 5 (ref 5.0–8.0)

## 2020-10-25 LAB — FERRITIN: Ferritin: 940 ng/mL — ABNORMAL HIGH (ref 24–336)

## 2020-10-25 LAB — PROCALCITONIN: Procalcitonin: <0.1 ng/mL

## 2020-10-25 LAB — TRIGLYCERIDES: Triglycerides: 60 mg/dL (ref <150)

## 2020-10-25 LAB — LACTIC ACID, PLASMA: Lactic Acid, Venous: 1.6 mmol/L (ref 0.5–1.9)

## 2020-10-25 LAB — LACTATE DEHYDROGENASE: LDH: 184 U/L (ref 98–192)

## 2020-10-25 LAB — C-REACTIVE PROTEIN: CRP: 16.2 mg/dL — ABNORMAL HIGH (ref <1.0)

## 2020-10-25 LAB — D-DIMER, QUANTITATIVE: D-Dimer, Quant: 0.92 ug/mL-FEU — ABNORMAL HIGH (ref 0.00–0.50)

## 2020-10-25 MED ORDER — PREDNISONE 20 MG PO TABS: 50.0000 mg | ORAL_TABLET | Freq: Every day | ORAL | Status: DC

## 2020-10-25 MED ORDER — METHYLPREDNISOLONE SODIUM SUCC 125 MG IJ SOLR
0.5000 mg/kg | Freq: Two times a day (BID) | INTRAMUSCULAR | Status: DC
  Administered 2020-10-25 – 2020-10-27 (×4): 48.75 mg via INTRAVENOUS
  Filled 2020-10-25 (×4): qty 2

## 2020-10-25 MED ORDER — ACETAMINOPHEN 325 MG PO TABS
650.0000 mg | ORAL_TABLET | Freq: Four times a day (QID) | ORAL | Status: DC | PRN
  Administered 2020-10-26 – 2020-10-30 (×2): 650 mg via ORAL
  Filled 2020-10-25 (×2): qty 2

## 2020-10-25 MED ORDER — ONDANSETRON HCL 4 MG/2ML IJ SOLN: 4.0000 mg | Freq: Four times a day (QID) | INTRAMUSCULAR | Status: DC | PRN

## 2020-10-25 MED ORDER — SODIUM CHLORIDE 0.9 % IV SOLN: 200.0000 mg | Freq: Once | INTRAVENOUS | Status: DC

## 2020-10-25 MED ORDER — SODIUM CHLORIDE 0.9 % IV SOLN: 100.0000 mg | Freq: Every day | INTRAVENOUS | Status: DC

## 2020-10-25 MED ORDER — INSULIN ASPART 100 UNIT/ML ~~LOC~~ SOLN
0.0000 [IU] | Freq: Every day | SUBCUTANEOUS | Status: DC
  Administered 2020-10-28: 23:00:00 2 [IU] via SUBCUTANEOUS
  Administered 2020-10-29 – 2020-10-30 (×2): 3 [IU] via SUBCUTANEOUS
  Administered 2020-10-31: 23:00:00 2 [IU] via SUBCUTANEOUS
  Administered 2020-11-04 – 2020-11-06 (×3): 3 [IU] via SUBCUTANEOUS
  Administered 2020-11-08: 22:00:00 5 [IU] via SUBCUTANEOUS
  Administered 2020-11-09: 23:00:00 2 [IU] via SUBCUTANEOUS
  Administered 2020-11-10: 4 [IU] via SUBCUTANEOUS
  Administered 2020-11-11: 3 [IU] via SUBCUTANEOUS
  Administered 2020-11-12: 4 [IU] via SUBCUTANEOUS
  Administered 2020-11-13: 2 [IU] via SUBCUTANEOUS
  Administered 2020-11-15: 3 [IU] via SUBCUTANEOUS

## 2020-10-25 MED ORDER — ATORVASTATIN CALCIUM 20 MG PO TABS
20.0000 mg | ORAL_TABLET | Freq: Every day | ORAL | Status: DC
  Administered 2020-10-26 – 2020-11-18 (×23): 20 mg via ORAL
  Filled 2020-10-25 (×19): qty 1
  Filled 2020-10-25: qty 2
  Filled 2020-10-25 (×4): qty 1

## 2020-10-25 MED ORDER — ALBUTEROL SULFATE HFA 108 (90 BASE) MCG/ACT IN AERS
2.0000 | INHALATION_SPRAY | Freq: Four times a day (QID) | RESPIRATORY_TRACT | Status: DC
  Administered 2020-10-25 – 2020-10-30 (×20): 2 via RESPIRATORY_TRACT
  Filled 2020-10-25 (×2): qty 6.7

## 2020-10-25 MED ORDER — ONDANSETRON HCL 4 MG PO TABS: 4.0000 mg | ORAL_TABLET | Freq: Four times a day (QID) | ORAL | Status: DC | PRN

## 2020-10-25 MED ORDER — GUAIFENESIN-DM 100-10 MG/5ML PO SYRP
10.0000 mL | ORAL_SOLUTION | ORAL | Status: DC | PRN
  Administered 2020-10-29 – 2020-11-18 (×3): 10 mL via ORAL
  Filled 2020-10-25 (×3): qty 10

## 2020-10-25 MED ORDER — ENOXAPARIN SODIUM 40 MG/0.4ML ~~LOC~~ SOLN
40.0000 mg | SUBCUTANEOUS | Status: DC
  Administered 2020-10-25 – 2020-10-29 (×5): 40 mg via SUBCUTANEOUS
  Filled 2020-10-25 (×5): qty 0.4

## 2020-10-25 MED ORDER — CEFTRIAXONE SODIUM 1 G IJ SOLR
1.0000 g | Freq: Once | INTRAMUSCULAR | Status: AC
  Administered 2020-10-25: 22:00:00 1 g via INTRAVENOUS
  Filled 2020-10-25: qty 10

## 2020-10-25 MED ORDER — DEXAMETHASONE SODIUM PHOSPHATE 10 MG/ML IJ SOLN
8.0000 mg | Freq: Once | INTRAMUSCULAR | Status: AC
  Administered 2020-10-25: 23:00:00 8 mg via INTRAVENOUS
  Filled 2020-10-25: qty 1

## 2020-10-25 MED ORDER — SODIUM CHLORIDE 0.9 % IV SOLN
500.0000 mg | Freq: Once | INTRAVENOUS | Status: AC
  Administered 2020-10-25: 22:00:00 500 mg via INTRAVENOUS
  Filled 2020-10-25: qty 500

## 2020-10-25 MED ORDER — SODIUM CHLORIDE 0.9 % IV SOLN
100.0000 mg | Freq: Every day | INTRAVENOUS | Status: AC
  Administered 2020-10-26 – 2020-10-29 (×4): 100 mg via INTRAVENOUS
  Filled 2020-10-25 (×4): qty 20

## 2020-10-25 MED ORDER — INSULIN ASPART 100 UNIT/ML ~~LOC~~ SOLN
0.0000 [IU] | Freq: Three times a day (TID) | SUBCUTANEOUS | Status: DC
  Administered 2020-10-26 – 2020-10-27 (×3): 5 [IU] via SUBCUTANEOUS
  Administered 2020-10-27: 09:00:00 3 [IU] via SUBCUTANEOUS
  Administered 2020-10-27 – 2020-10-29 (×6): 5 [IU] via SUBCUTANEOUS
  Administered 2020-10-30: 18:00:00 3 [IU] via SUBCUTANEOUS
  Administered 2020-10-30: 12:00:00 11 [IU] via SUBCUTANEOUS
  Administered 2020-10-30: 09:00:00 3 [IU] via SUBCUTANEOUS
  Administered 2020-10-31 (×2): 8 [IU] via SUBCUTANEOUS
  Administered 2020-10-31: 18:00:00 3 [IU] via SUBCUTANEOUS
  Administered 2020-11-01: 18:00:00 2 [IU] via SUBCUTANEOUS
  Administered 2020-11-01: 13:00:00 5 [IU] via SUBCUTANEOUS
  Administered 2020-11-01: 09:00:00 8 [IU] via SUBCUTANEOUS
  Administered 2020-11-02: 18:00:00 3 [IU] via SUBCUTANEOUS
  Administered 2020-11-02: 10:00:00 5 [IU] via SUBCUTANEOUS
  Administered 2020-11-02: 14:00:00 11 [IU] via SUBCUTANEOUS
  Administered 2020-11-03 – 2020-11-04 (×5): 5 [IU] via SUBCUTANEOUS
  Administered 2020-11-04 – 2020-11-05 (×2): 8 [IU] via SUBCUTANEOUS
  Administered 2020-11-05: 13:00:00 5 [IU] via SUBCUTANEOUS
  Administered 2020-11-05: 18:00:00 3 [IU] via SUBCUTANEOUS
  Administered 2020-11-06: 18:00:00 5 [IU] via SUBCUTANEOUS
  Administered 2020-11-06: 10:00:00 8 [IU] via SUBCUTANEOUS
  Administered 2020-11-06: 14:00:00 15 [IU] via SUBCUTANEOUS
  Administered 2020-11-07: 17:00:00 3 [IU] via SUBCUTANEOUS
  Administered 2020-11-07: 13:00:00 5 [IU] via SUBCUTANEOUS
  Administered 2020-11-07: 09:00:00 3 [IU] via SUBCUTANEOUS
  Administered 2020-11-08: 17:00:00 8 [IU] via SUBCUTANEOUS
  Administered 2020-11-09: 09:00:00 2 [IU] via SUBCUTANEOUS
  Administered 2020-11-09: 18:00:00 11 [IU] via SUBCUTANEOUS
  Administered 2020-11-09 – 2020-11-10 (×3): 3 [IU] via SUBCUTANEOUS
  Administered 2020-11-11: 8 [IU] via SUBCUTANEOUS
  Administered 2020-11-11: 5 [IU] via SUBCUTANEOUS
  Administered 2020-11-11: 8 [IU] via SUBCUTANEOUS
  Administered 2020-11-12: 2 [IU] via SUBCUTANEOUS
  Administered 2020-11-12 (×2): 5 [IU] via SUBCUTANEOUS
  Administered 2020-11-13: 2 [IU] via SUBCUTANEOUS
  Administered 2020-11-13: 11 [IU] via SUBCUTANEOUS
  Administered 2020-11-13: 8 [IU] via SUBCUTANEOUS
  Administered 2020-11-14 (×2): 5 [IU] via SUBCUTANEOUS
  Administered 2020-11-15: 2 [IU] via SUBCUTANEOUS
  Administered 2020-11-15: 8 [IU] via SUBCUTANEOUS
  Administered 2020-11-15: 11 [IU] via SUBCUTANEOUS
  Filled 2020-10-25 (×3): qty 1

## 2020-10-25 MED ORDER — SODIUM CHLORIDE 0.9 % IV BOLUS
500.0000 mL | Freq: Once | INTRAVENOUS | Status: AC
  Administered 2020-10-25: 500 mL via INTRAVENOUS

## 2020-10-25 MED ORDER — SODIUM CHLORIDE 0.9 % IV SOLN
100.0000 mg | INTRAVENOUS | Status: AC
  Administered 2020-10-25 – 2020-10-26 (×2): 100 mg via INTRAVENOUS
  Filled 2020-10-25 (×2): qty 20

## 2020-10-25 NOTE — ED Triage Notes (Addendum)
Pt states he was in a coma for two days because he did not hear his friends knocking on doors and window. Pt states the police came last night for a wellness check and he woke up. Pt states he had a positive Covid test on 10/15/20 at Dr. Juel Burrow office which I am unable to confirm.

## 2020-10-25 NOTE — H&P (Signed)
History and Physical  KELL FERRIS ALP:[id number] DOB: [birth year] DOA: 11/04/2020  Referring physician: Marcello Fennel, PA-C PCP: [name], MD  Patient coming from: Home  Chief Complaint: Possible syncope and collapse  HPI: Lawrence [name] is a 69 y.o. male with medical history significant for aortic insufficiency, aortic stenosis,  A. fib, aortic aneurysm presents to the emergency department  due to 2-day onset of shortness of breath.  Patient complained of more than 1 week onset of upper respiratory tract infection symptoms including cough, sore throat, nasal congestion, fever and chills and an episode of nausea and nonbloody vomiting.  These symptoms resolved after about a week, but returned after a few days with same symptoms including productive cough, nasal congestion and general body aches.  Patient states that he was lying on the couch for about 2 days without being aware of people knocking on his door, neither did he hear his phone ring.  Apparently, friends and family have been trying to reach him during this period, so he thought that he must have been in a "coma".  He notified his sister in [place] and she activated EMS and patient was taken to the ED for further evaluation and management. He states that he tested positive for Covid on 12/5.   ED Course:  In the emergency department, he was tachypneic, tachycardic and hypoxic with an O2 sat of 88% on room air, supplemental oxygen at 4 PM was provided with improvement in O2sat to low 90s.  Work-up in the ED showed H/H 19.5/57.8, hyperglycemia, urinalysis which was unimpressive for UTI.  Ferritin 940, CRP 16.2, fibrinogen 620, D-dimer 0.92.  SARS coronavirus 2 was positive.  Chest x-ray showed patchy opacity at the lung bases and right midlung suspicious for atelectasis versus pneumonia. He was treated with IV antibiotics (ceftriaxone and azithromycin).  IV NS 500 mL was given. Hospitalist was asked to admit patient for  further evaluation and management.  Review of Systems:  Constitutional: Negative for chills and fever.  HENT: Positive for nasal congestion.  Negative for ear pain and sore throat.   Eyes: Negative for pain and visual disturbance.  Respiratory: Positive for cough and shortness of breath. Cardiovascular: Negative for chest pain and palpitations.  Gastrointestinal: Negative for abdominal pain and vomiting.  Endocrine: Negative for polyphagia and polyuria.  Genitourinary: Negative for decreased urine volume, dysuria, enuresis, hematuria Musculoskeletal: Negative for arthralgias and back pain.  Skin: Negative for color change and rash.  Allergic/Immunologic: Negative for immunocompromised state.  Neurological: Negative for tremors, syncope, speech difficulty, weakness, light-headedness and headaches.  Hematological: Does not bruise/bleed easily.  All other systems reviewed and are negative   Past Medical History:  Diagnosis Date  . Aortic atherosclerosis ([place])   . Aortic insufficiency   . Aortic stenosis   . Arthritis   . Bicuspid aortic valve    a. mild AS, mild-mod AI by echo 03/2017, 5cm thoracic aortic aneurysm by CT 07/2017.  . Essential hypertension   . GERD (gastroesophageal reflux disease)   . Gout   . Postoperative atrial fibrillation ([place])   . Thoracic aortic aneurysm ([place])    a. 07/2017: 5cm by CT.   Past Surgical History:  Procedure Laterality Date  . ASCENDING AORTIC ROOT REPLACEMENT N/A 07/20/2018   Procedure: REPLACMENT OF ASCENDING AORTA WITH 32MM GRAFT. HYPOTHERMIC CIRCULATORY ARREST. WHEAT PROCEDURE.;  Surgeon: [name], MD;  Location: [facility];  Service: Open Heart Surgery;  Laterality: N/A;  . COLONOSCOPY N/A 08/29/2016  Procedure: COLONOSCOPY;  Surgeon: Danie Binder, MD;  [facility];  Service: Endoscopy;  Laterality: N/A;  1030  . COLONOSCOPY N/A 03/09/2018   Procedure: COLONOSCOPY;  Surgeon: Danie Binder, MD;  [facility];   Service: Endoscopy;  Laterality: N/A;  1:15pm  . HERNIA REPAIR     umbilical  . MASS EXCISION N/A 03/18/2018   Procedure: EXCISION CYST ON BACK 2CM;  Surgeon: [name], MD;  Location: [facility];  Service: General;  Laterality: N/A;  . POLYPECTOMY  03/09/2018   Procedure: POLYPECTOMY;  Surgeon: Danie Binder, MD;  [facility];  Service: Endoscopy;;  ascending, transverse x2; descending, sigmoid  . RIGHT/LEFT HEART CATH AND CORONARY ANGIOGRAPHY N/A 09/05/2017   Procedure: RIGHT/LEFT HEART CATH AND CORONARY ANGIOGRAPHY;  Surgeon: Larey Dresser, MD;  Location: [facility];  Service: Cardiovascular;  Laterality: N/A;  . TEE WITHOUT CARDIOVERSION N/A 07/20/2018   Procedure: TRANSESOPHAGEAL ECHOCARDIOGRAM (TEE);  Surgeon: [name], MD;  Location: [facility];  Service: Open Heart Surgery;  Laterality: N/A;    Social History:  reports that he quit smoking about 39 years ago. His smoking use included cigarettes. He has a 62.50 pack-year smoking history. He has never used smokeless tobacco. He reports current alcohol use. He reports that he does not use drugs.   Allergies  Allergen Reactions  . Ciprofloxacin   . Morphine And Related Other (See Comments)    Caused patient to pass out.     Family History  Problem Relation Age of Onset  . Colon polyps Sister   . Heart attack Mother   . Stomach cancer Father   . Colon cancer Neg Hx      Prior to Admission medications   Medication Sig Start Date End Date Taking? Authorizing Provider  acetaminophen (TYLENOL) 500 MG tablet Take 500 mg by mouth every 6 (six) hours as needed.   Yes [provider]  APPLE CIDER VINEGAR PO Take 1 tablet by mouth daily.   Yes [provider]  Ascorbic Acid (VITAMIN C) 1000 MG tablet Take 1,000 mg by mouth daily.   Yes [provider]  atorvastatin (LIPITOR) 20 MG tablet Take 1 tablet by mouth daily. 03/22/20  Yes [provider]  benzonatate (TESSALON)  100 MG capsule Take 100 mg by mouth 3 (three) times daily as needed. 10/19/20  Yes [provider]  dexamethasone (DECADRON) 6 MG tablet Take 6 mg by mouth daily. 10/19/20  Yes [provider]  diltiazem (CARDIZEM CD) 120 MG 24 hr capsule Take 1 capsule (120 mg total) by mouth daily. 08/06/18  Yes Kathyrn Drown D, NP  Multiple Vitamin (MULTIVITAMIN) tablet Take 1 tablet by mouth daily.   Yes [provider]  zinc gluconate 50 MG tablet Take 50 mg by mouth daily.   Yes [provider]  metoprolol tartrate (LOPRESSOR) 25 MG tablet Take 1 tablet (25 mg total) by mouth 2 (two) times daily. Patient not taking: No sig reported 08/05/18   Tommie Raymond, NP    Physical Exam: BP 135/80   Pulse 64   Temp 98.5 F (36.9 C) (Oral)   Resp (!) 34   Ht 5\' 11"  (1.803 m)   Wt 97.1 kg   SpO2 (!) 88%   BMI 29.85 kg/m   . General: 69 y.o. year-old male well developed well nourished in no acute distress.  Alert and oriented x3. Marland Kitchen HEENT: NCAT, EOMI . Neck: Supple,  trachea medial . Cardiovascular: Tachycardia.  Regular rate and rhythm with no rubs or gallops.  No thyromegaly or JVD noted.  2/4 pulses in all 4 extremities. Marland Kitchen Respiratory: Tachypnea.  Audible rhonchi in right lower lobe.  . Abdomen: Soft nontender nondistended with normal bowel sounds x4 quadrants. . Muskuloskeletal: No cyanosis, clubbing or edema noted bilaterally . Neuro: CN II-XII intact, strength, sensation, reflexes . Skin: No ulcerative lesions noted or rashes . Psychiatry: Judgement and insight appear normal. Mood is appropriate for condition and setting          Labs on Admission:  Basic Metabolic Panel: Recent Labs  Lab 10/29/2020 1427  NA 135  K 4.2  CL 97*  CO2 24  GLUCOSE 219*  BUN 39*  CREATININE 1.22  CALCIUM 9.4   Liver Function Tests: No results for input(s): AST, ALT, ALKPHOS, BILITOT, PROT, ALBUMIN in the last 168 hours. No results for input(s): LIPASE, AMYLASE in the last  168 hours. No results for input(s): AMMONIA in the last 168 hours. CBC: Recent Labs  Lab 10/24/2020 1427  WBC 8.0  HGB 19.5*  HCT 57.8*  MCV 93.4  PLT 160   Cardiac Enzymes: No results for input(s): CKTOTAL, CKMB, CKMBINDEX, TROPONINI in the last 168 hours.  BNP (last 3 results) No results for input(s): BNP in the last 8760 hours.  ProBNP (last 3 results) No results for input(s): PROBNP in the last 8760 hours.  CBG: Recent Labs  Lab 10/16/2020 2248  GLUCAP 190*    Radiological Exams on Admission: DG Chest Port 1 View  Result Date: 10/28/2020 CLINICAL DATA:  Cough.  Shortness of breath.  Chest pain. EXAM: PORTABLE CHEST 1 VIEW COMPARISON:  Radiograph 08/27/2018. CT 07/08/2019 FINDINGS: Low lung volumes. Post median sternotomy. Heart is normal in size. Similar aortic tortuosity. Mild peribronchial no interstitial thickening. Superimposed patchy opacity at the lung bases and right mid lung. No pneumothorax or pleural effusion. No acute osseous abnormalities are seen. IMPRESSION: 1. Patchy opacity at the lung bases and right mid lung, suspicious for atelectasis versus pneumonia in the setting of cough. 2. Mild peribronchial thickening. Electronically Signed   By: Keith Rake M.D.   On: 10/17/2020 20:12    EKG: I independently viewed the EKG done and my findings are as followed: Sinus tachycardia at a rate of 118 bpm  Assessment/Plan Present on Admission: . Pneumonia due to COVID-19 virus  Principal Problem:   Pneumonia due to COVID-19 virus Active Problems:   Atrial fibrillation, chronic (H[facility])   Acute respiratory failure with hypoxia (H[facility])   Hyperglycemia   Syncope and collapse  Acute respiratory failure with hypoxia secondary to COVID-19 Virus Pneumonia SARS coronavirus 2 virus was positive  Chest x-ray showed patchy opacity at the lung bases and right midlung suspicious for atelectasis versus pneumonia. Continue albuterol q.6h Continue IV Solu-Medrol per pharmacy  dosing Continue IV Remdesivir per pharmacy protocol Continue insulin sliding scale and hypoglycemic protocol Continue Mucinex, Robitussin  Continue Tylenol p.r.n. for fever Continue supplemental oxygen to maintain O2 sat > or = 94% with plan to wean patient off supplemental oxygen as tolerated (of note, patient does not use oxygen at baseline) Continue incentive spirometry and flutter valve q54min as tolerated Encourage proning, early ambulation, and side laying as tolerated Continue airborne isolation precaution Inflammatory markers: LDH:  184 CRP:  16.2 D-dimer: 0.92 Ferritin:940  Continue monitoring daily inflammatory markers Physician PPE:  Surgical mask with face shield, N-95, nonsterile gloves, disposable gown, head and shoe cover s  Patient PPE:  Face mask   Questionable syncope and collapse  Patient referred to being in "coma" for 2 days.  However, it was noted that patient was unaware of family members and friends trying to reach him during this period. Continue telemetry, watch for arrhythmias Patient denies any chest pain, serial troponins will be checked  EKG showed sinus tachycardia at a rate of 118 bpm  Echocardiogram will be done in the morning to rule out significant aortic stenosis or other outflow obstruction, and also to evaluate EF and to rule out segmental/Regional wall motion abnormalities.   Elevated H/H possibly secondary to hemoconcentration H/H 19.5/15.8 Gentle hydration was provided in the ED   Hyperglycemia with no known history of type 2 diabetes mellitus Blood glucose 219; Decadron was noted in patient's med rec Hemoglobin A1c will be checked Continue insulin sliding scale and hypoglycemia protocol  Chronic atrial fibrillation EKG shows sinus tachycardia at a rate of 118 bpm Continue home Lopressor and Cardizem  Hyperlipidemia Continue Lipitor  DVT prophylaxis: Lovenox  Code Status: Partial code (DNI)  Family Communication: None at  bedside  Disposition Plan:  Patient is from:                        home Anticipated DC to:                   SNF or family members home Anticipated DC date:               2-3 days Anticipated DC barriers:           Patient is unstable to be discharged at this time due to hypoxia secondary to 19 pneumonia virus requiring inpatient management   Consults called: None  Admission status: Inpatient   Bernadette Hoit MD [facility]  10/26/2020, 12:19 AM

## 2020-10-25 NOTE — Progress Notes (Signed)
Explained IS usage and Flutter usage to patient.  Patient did flutter X10 with good patient effort.  Patient did IS X5 with good patient effort and was able to achieve 1245ml.  Patient found it hard to do more than 5 on IS.

## 2020-10-25 NOTE — ED Notes (Signed)
Cultures draw prior to antibiotics

## 2020-10-25 NOTE — ED Provider Notes (Signed)
[facility] EMERGENCY DEPARTMENT Provider Note   CSN: [id number] Arrival date & time: 11/07/2020  1245     History Chief Complaint  Patient presents with  . syncope    Lawrence [name] is a 69 y.o. male.  HPI   Patient with significant medical history of aortic insufficiency, aortic stenosis, GERD, gout, A. fib, aortic aneurysm presents to the emergency department with chief complaint of shortness of breath.  Patient states he tested positive for Covid on 12/05 and has had URI-like symptoms, nasal congestion, sore throat, cough, productive cough, fevers and chills, with one episode of nausea and vomiting.  He states after a week his symptoms resolved  now he started to feel like he is getting another URI again.  He endorses nasal congestion, productive cough, and general body aches.  He states that he" was in a coma" for 2 days where he was lying on the couch and did not hear people knock on his door. The ambulance was called for a wellness check.  He denies recent sick contacts, is not vaccinated for COVID-19.  He denies any alleviating factors at this time.  He states he has been tolerating p.o. without any difficulty.  Patient denies chest pain, shortness of breath, abdominal pain, nausea, vomiting, diarrhea, pedal edema.  Past Medical History:  Diagnosis Date  . Aortic atherosclerosis ([place])   . Aortic insufficiency   . Aortic stenosis   . Arthritis   . Bicuspid aortic valve    a. mild AS, mild-mod AI by echo 03/2017, 5cm thoracic aortic aneurysm by CT 07/2017.  . Essential hypertension   . GERD (gastroesophageal reflux disease)   . Gout   . Postoperative atrial fibrillation ([place])   . Thoracic aortic aneurysm ([place])    a. 07/2017: 5cm by CT.    Patient Active Problem List   Diagnosis Date Noted  . Pneumonia due to COVID-19 virus 10/31/2020  . Atrial fibrillation with RVR ([place]) 08/04/2018  . Atrial fibrillation ([place]) 08/04/2018  . 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 Sebaceous cyst  03/06/2018  . 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 Essential hypertension 09/01/2017  . Aortic atherosclerosis ([place])   . Aortic insufficiency   . Aortic stenosis   . Bicuspid aortic valve   . Thoracic aortic aneurysm ([place])   . History of colonic polyps 08/01/2016    Past Surgical History:  Procedure Laterality Date  . ASCENDING AORTIC ROOT REPLACEMENT N/A 07/20/2018   Procedure: REPLACMENT OF ASCENDING AORTA WITH 32MM GRAFT. HYPOTHERMIC CIRCULATORY ARREST. WHEAT PROCEDURE.;  Surgeon: [name], MD;  Location: [place];  Service: Open Heart Surgery;  Laterality: N/A;  . COLONOSCOPY N/A 08/29/2016   Procedure: COLONOSCOPY;  Surgeon: Danie Binder, MD;  [facility];  Service: Endoscopy;  Laterality: N/A;  1030  . COLONOSCOPY N/A 03/09/2018   Procedure: COLONOSCOPY;  Surgeon: Danie Binder, MD;  [facility];  Service: Endoscopy;  Laterality: N/A;  1:15pm  . HERNIA REPAIR     umbilical  . MASS EXCISION N/A 03/18/2018   Procedure: EXCISION CYST ON BACK 2CM;  Surgeon: [name], MD;  Location: [facility];  Service: General;  Laterality: N/A;  . POLYPECTOMY  03/09/2018   Procedure: POLYPECTOMY;  Surgeon: Danie Binder, MD;  [facility];  Service: Endoscopy;;  ascending, transverse x2; descending, sigmoid  . RIGHT/LEFT HEART CATH AND CORONARY ANGIOGRAPHY N/A 09/05/2017   Procedure: RIGHT/LEFT HEART CATH AND CORONARY ANGIOGRAPHY;  Surgeon: Loralie Champagne  S, MD;  Location: [place] [facility];  Service: Cardiovascular;  Laterality: N/A;  . TEE WITHOUT CARDIOVERSION N/A 07/20/2018   Procedure: TRANSESOPHAGEAL ECHOCARDIOGRAM (TEE);  Surgeon: [name], MD;  Location: [place];  Service: Open Heart Surgery;  Laterality: N/A;       Family History  Problem Relation Age of Onset  . Colon polyps Sister   . Heart attack Mother   . Stomach cancer Father   . Colon cancer Neg Hx     Social History   Tobacco Use  . Smoking status: Former  Smoker    Packs/day: 2.50    Years: 25.00    Pack years: 62.50    Types: Cigarettes    Quit date: 08/01/1981    Years since quitting: 39.2  . Smokeless tobacco: Never Used  Vaping Use  . Vaping Use: Never used  Substance Use Topics  . Alcohol use: Yes    Comment: occ  . Drug use: No    Home Medications Prior to Admission medications   Medication Sig Start Date End Date Taking? Authorizing Provider  acetaminophen (TYLENOL) 500 MG tablet Take 500 mg by mouth every 6 (six) hours as needed.   Yes [provider]  APPLE CIDER VINEGAR PO Take 1 tablet by mouth daily.   Yes [provider]  Ascorbic Acid (VITAMIN C) 1000 MG tablet Take 1,000 mg by mouth daily.   Yes [provider]  atorvastatin (LIPITOR) 20 MG tablet Take 1 tablet by mouth daily. 03/22/20  Yes [provider]  benzonatate (TESSALON) 100 MG capsule Take 100 mg by mouth 3 (three) times daily as needed. 10/19/20  Yes [provider]  dexamethasone (DECADRON) 6 MG tablet Take 6 mg by mouth daily. 10/19/20  Yes [provider]  diltiazem (CARDIZEM CD) 120 MG 24 hr capsule Take 1 capsule (120 mg total) by mouth daily. 08/06/18  Yes Kathyrn Drown D, NP  Multiple Vitamin (MULTIVITAMIN) tablet Take 1 tablet by mouth daily.   Yes [provider]  zinc gluconate 50 MG tablet Take 50 mg by mouth daily.   Yes [provider]  metoprolol tartrate (LOPRESSOR) 25 MG tablet Take 1 tablet (25 mg total) by mouth 2 (two) times daily. Patient not taking: No sig reported 08/05/18   Kathyrn Drown D, NP    Allergies    Ciprofloxacin and Morphine and related  Review of Systems   Review of Systems  Constitutional: Negative for chills and fever.  HENT: Positive for congestion. Negative for sore throat.   Eyes: Negative for visual disturbance.  Respiratory: Positive for cough. Negative for shortness of breath.   Cardiovascular: Negative for chest pain.  Gastrointestinal:  Negative for abdominal pain, diarrhea, nausea and vomiting.  Genitourinary: Negative for dysuria, enuresis and flank pain.  Musculoskeletal: Negative for back pain.  Skin: Negative for rash.  Neurological: Positive for headaches. Negative for dizziness.  Hematological: Does not bruise/bleed easily.    Physical Exam Updated Vital Signs BP (!) 133/95   Pulse 74   Temp 98.5 F (36.9 C) (Oral)   Resp (!) 33   Ht 5\' 11"  (1.803 m)   Wt 97.1 kg   SpO2 91%   BMI 29.85 kg/m   Physical Exam Vitals and nursing note reviewed.  Constitutional:      General: He is not in acute distress.    Appearance: He is ill-appearing.  HENT:     Head: Normocephalic and atraumatic.     Right Ear:  Tympanic membrane, ear canal and external ear normal.     Left Ear: Tympanic membrane, ear canal and external ear normal.     Ears:     Comments: Patient has a fluid level behind his right TM, no signs of infection noted.  Patient's left TM is retracted no signs of infection present.    Nose: Congestion present.     Comments: Patient has noted bilateral erythematous turbinates.    Mouth/Throat:     Mouth: Mucous membranes are dry.     Pharynx: Oropharynx is clear. No oropharyngeal exudate or posterior oropharyngeal erythema.  Eyes:     Conjunctiva/sclera: Conjunctivae normal.  Cardiovascular:     Rate and Rhythm: Regular rhythm. Tachycardia present.     Pulses: Normal pulses.     Heart sounds: No murmur heard. No friction rub. No gallop.   Pulmonary:     Effort: No respiratory distress.     Breath sounds: Rhonchi and rales present. No wheezing.     Comments: Patient has noted bilateral rails in his lower lobes, with rhonchi noted in the right lower lobe. Abdominal:     Palpations: Abdomen is soft.     Tenderness: There is no abdominal tenderness.  Musculoskeletal:     Right lower leg: No edema.     Left lower leg: No edema.  Skin:    General: Skin is warm and dry.  Neurological:     Mental  Status: He is alert.  Psychiatric:        Mood and Affect: Mood normal.     ED Results / Procedures / Treatments   Labs (all labs ordered are listed, but only abnormal results are displayed) Labs Reviewed  RESP PANEL BY RT-PCR (RSV, FLU A&B, COVID)  RVPGX2 - Abnormal; Notable for the following components:      Result Value   SARS Coronavirus 2 by RT PCR POSITIVE (*)    All other components within normal limits  BASIC METABOLIC PANEL - Abnormal; Notable for the following components:   Chloride 97 (*)    Glucose, Bld 219 (*)    BUN 39 (*)    All other components within normal limits  CBC - Abnormal; Notable for the following components:   RBC 6.19 (*)    Hemoglobin 19.5 (*)    HCT 57.8 (*)    All other components within normal limits  URINALYSIS, ROUTINE W REFLEX MICROSCOPIC - Abnormal; Notable for the following components:   APPearance HAZY (*)    Specific Gravity, Urine 1.032 (*)    Glucose, UA 50 (*)    Ketones, ur 20 (*)    Protein, ur 100 (*)    Bacteria, UA RARE (*)    All other components within normal limits  CULTURE, BLOOD (ROUTINE X 2)  CULTURE, BLOOD (ROUTINE X 2)  LACTIC ACID, PLASMA  LACTIC ACID, PLASMA  D-DIMER, QUANTITATIVE (NOT AT [facility])  PROCALCITONIN  LACTATE DEHYDROGENASE  FERRITIN  TRIGLYCERIDES  FIBRINOGEN  C-REACTIVE PROTEIN    EKG None  Radiology DG Chest Port 1 View  Result Date: 10/27/2020 CLINICAL DATA:  Cough.  Shortness of breath.  Chest pain. EXAM: PORTABLE CHEST 1 VIEW COMPARISON:  Radiograph 08/27/2018. CT 07/08/2019 FINDINGS: Low lung volumes. Post median sternotomy. Heart is normal in size. Similar aortic tortuosity. Mild peribronchial no interstitial thickening. Superimposed patchy opacity at the lung bases and right mid lung. No pneumothorax or pleural effusion. No acute osseous abnormalities are seen. IMPRESSION: 1. Patchy opacity at the lung bases  and right mid lung, suspicious for atelectasis versus pneumonia in the setting of  cough. 2. Mild peribronchial thickening. Electronically Signed   By: Keith Rake M.D.   On: 10/22/2020 20:12    Procedures .Critical Care Performed by: Marcello Fennel, PA-C Authorized by: Marcello Fennel, PA-C   Critical care provider statement:    Critical care time (minutes):  35   Critical care time was exclusive of:  Separately billable procedures and treating other patients   Critical care was necessary to treat or prevent imminent or life-threatening deterioration of the following conditions:  Respiratory failure   Critical care was time spent personally by me on the following activities:  Discussions with consultants, evaluation of patient's response to treatment, examination of patient, ordering and performing treatments and interventions, ordering and review of laboratory studies, ordering and review of radiographic studies, pulse oximetry, re-evaluation of patient's condition and review of old charts   I assumed direction of critical care for this patient from another provider in my specialty: no     Care discussed with: admitting provider     (including critical care time)  Medications Ordered in ED Medications  cefTRIAXone (ROCEPHIN) 1 g in sodium chloride 0.9 % 100 mL IVPB (has no administration in time range)  azithromycin (ZITHROMAX) 500 mg in sodium chloride 0.9 % 250 mL IVPB (has no administration in time range)  dexamethasone (DECADRON) injection 8 mg (has no administration in time range)  remdesivir 100 mg in sodium chloride 0.9 % 100 mL IVPB (has no administration in time range)  remdesivir 100 mg in sodium chloride 0.9 % 100 mL IVPB (has no administration in time range)  sodium chloride 0.9 % bolus 500 mL (500 mLs Intravenous New Bag/Given 10/22/2020 1944)    ED Course  I have reviewed the triage vital signs and the nursing notes.  Pertinent labs & imaging results that were available during my care of the patient were reviewed by me and considered in my  medical decision making (see chart for details).    MDM Rules/Calculators/A&P                          Patient presents with URI-like symptoms.  He is alert, does not appear in acute distress, vital signs have been for tachycardia and hypoxia.  Will place patient on 2 L via nasal cannula start patient on a small fluid bolus and obtain basic lab work.  Patient reassessed after providing him on 2 L via nasal cannula, O2 sats have stabilized in the mid 90s.  Concern for CAP versus Covid pneumonia will provide patient with Decadron and continue to monitor.  Due to new oxygen requirements and concerning x-ray for pneumonia will consult with hospitalist for further evaluation.  Spoke with Dr. Josephine Cables who agrees patient should be admitted and he will come down and evaluate the patient.  CBC shows no leukocytosis but shows hemoconcentrated with red blood cells 6.19, hemoglobin of 19.5.  BMP negative for electrolyte abnormalities, no metabolic acidosis, hyperglycemia of 219, elevated BUN of 39, no anion gap present.  UA shows ketones, proteins, negative nitrates or leukocytes, rare bacteria few white blood cells.  Respiratory panel shows positive Covid.  Chest x-ray shows patchy opacities at the lung bases and right midlung, suspicious for atelectasis versus pneumonia.  EKG shows sinus tach without signs of ischemia no ST elevation depression noted.  I have low suspicion for ACS as history is atypical, he denies  chest pain, patient has no cardiac history, EKG was sinus tach without signs of ischemia.  Low suspicion for PE as patient denies pleuritic chest pain, no pedal edema noted on exam, history is more consistent with a URI versus CAP. Low suspicion for systemic infection as patient is nontoxic-appearing.  I suspect patient's tachycardia may be multifactorial, dehydration as he was dry my exam and was found to be hypoxic.  Tachycardia improved with fluids and supplemental oxygen.  I suspect patient  suffering from Covid, pneumonia and would benefit from supplemental oxygenation and steroids.  Patient care will be transferred to admitting team for further evaluation.   Final Clinical Impression(s) / ED Diagnoses Final diagnoses:  COVID  Hypoxia    Rx / DC Orders ED Discharge Orders    None       Marcello Fennel, PA-C 10/24/2020 2145    Margette Fast, MD 10/31/20 [phone]

## 2020-10-25 NOTE — ED Notes (Signed)
Pt o2 84% on RA, placed on 4 liters nasal cannula. o2 now at 94%

## 2020-10-26 ENCOUNTER — Inpatient Hospital Stay (HOSPITAL_COMMUNITY): Payer: Medicare HMO

## 2020-10-26 DIAGNOSIS — R55 Syncope and collapse: Secondary | ICD-10-CM | POA: Diagnosis present

## 2020-10-26 LAB — CBC WITH DIFFERENTIAL/PLATELET
Abs Immature Granulocytes: 0.03 10*3/uL (ref 0.00–0.07)
Basophils Absolute: 0 10*3/uL (ref 0.0–0.1)
Basophils Relative: 0 %
Eosinophils Absolute: 0 10*3/uL (ref 0.0–0.5)
Eosinophils Relative: 0 %
HCT: 51.4 % (ref 39.0–52.0)
Hemoglobin: 17 g/dL (ref 13.0–17.0)
Immature Granulocytes: 1 %
Lymphocytes Relative: 9 %
Lymphs Abs: 0.5 10*3/uL — ABNORMAL LOW (ref 0.7–4.0)
MCH: 31.1 pg (ref 26.0–34.0)
MCHC: 33.1 g/dL (ref 30.0–36.0)
MCV: 94.1 fL (ref 80.0–100.0)
Monocytes Absolute: 0.1 10*3/uL (ref 0.1–1.0)
Monocytes Relative: 2 %
Neutro Abs: 5.4 10*3/uL (ref 1.7–7.7)
Neutrophils Relative %: 88 %
Platelets: 181 10*3/uL (ref 150–400)
RBC: 5.46 MIL/uL (ref 4.22–5.81)
RDW: 13.2 % (ref 11.5–15.5)
WBC: 6.1 10*3/uL (ref 4.0–10.5)
nRBC: 0 % (ref 0.0–0.2)

## 2020-10-26 LAB — ECHOCARDIOGRAM COMPLETE
AR max vel: 2.33 cm2
AV Area VTI: 2.83 cm2
AV Area mean vel: 2.6 cm2
AV Mean grad: 7.3 mmHg
AV Peak grad: 15.4 mmHg
Ao pk vel: 1.96 m/s
Area-P 1/2: 2.22 cm2
Height: 71 in
S' Lateral: 2.93 cm
Weight: 3424 oz

## 2020-10-26 LAB — COMPREHENSIVE METABOLIC PANEL
ALT: 40 U/L (ref 0–44)
AST: 34 U/L (ref 15–41)
Albumin: 3.5 g/dL (ref 3.5–5.0)
Alkaline Phosphatase: 51 U/L (ref 38–126)
Anion gap: 11 (ref 5–15)
BUN: 38 mg/dL — ABNORMAL HIGH (ref 8–23)
CO2: 23 mmol/L (ref 22–32)
Calcium: 8.9 mg/dL (ref 8.9–10.3)
Chloride: 102 mmol/L (ref 98–111)
Creatinine, Ser: 0.95 mg/dL (ref 0.61–1.24)
GFR, Estimated: >60 mL/min (ref >60)
Glucose, Bld: 209 mg/dL — ABNORMAL HIGH (ref 70–99)
Potassium: 3.8 mmol/L (ref 3.5–5.1)
Sodium: 136 mmol/L (ref 135–145)
Total Bilirubin: 0.9 mg/dL (ref 0.3–1.2)
Total Protein: 7.2 g/dL (ref 6.5–8.1)

## 2020-10-26 LAB — CBG MONITORING, ED
Glucose-Capillary: 185 mg/dL — ABNORMAL HIGH (ref 70–99)
Glucose-Capillary: 212 mg/dL — ABNORMAL HIGH (ref 70–99)
Glucose-Capillary: 211 mg/dL — ABNORMAL HIGH (ref 70–99)
Glucose-Capillary: 185 mg/dL — ABNORMAL HIGH (ref 70–99)

## 2020-10-26 LAB — PHOSPHORUS: Phosphorus: 3.2 mg/dL (ref 2.5–4.6)

## 2020-10-26 LAB — D-DIMER, QUANTITATIVE: D-Dimer, Quant: 1.1 ug/mL-FEU — ABNORMAL HIGH (ref 0.00–0.50)

## 2020-10-26 LAB — C-REACTIVE PROTEIN: CRP: 15.7 mg/dL — ABNORMAL HIGH (ref <1.0)

## 2020-10-26 LAB — TROPONIN I (HIGH SENSITIVITY)
Troponin I (High Sensitivity): 12 ng/L (ref <18)
Troponin I (High Sensitivity): 11 ng/L (ref <18)

## 2020-10-26 LAB — HEMOGLOBIN A1C
Hgb A1c MFr Bld: 6.1 % — ABNORMAL HIGH (ref 4.8–5.6)
Mean Plasma Glucose: 128.37 mg/dL

## 2020-10-26 LAB — FERRITIN: Ferritin: 822 ng/mL — ABNORMAL HIGH (ref 24–336)

## 2020-10-26 LAB — HIV ANTIBODY (ROUTINE TESTING W REFLEX): HIV Screen 4th Generation wRfx: NONREACTIVE

## 2020-10-26 LAB — LACTIC ACID, PLASMA: Lactic Acid, Venous: 1.1 mmol/L (ref 0.5–1.9)

## 2020-10-26 LAB — MAGNESIUM: Magnesium: 2.5 mg/dL — ABNORMAL HIGH (ref 1.7–2.4)

## 2020-10-26 MED ORDER — DILTIAZEM HCL ER COATED BEADS 120 MG PO CP24
120.0000 mg | ORAL_CAPSULE | Freq: Every day | ORAL | Status: DC
  Administered 2020-10-26 – 2020-11-18 (×24): 120 mg via ORAL
  Filled 2020-10-26 (×28): qty 1

## 2020-10-26 MED ORDER — ASCORBIC ACID 500 MG PO TABS
500.0000 mg | ORAL_TABLET | Freq: Two times a day (BID) | ORAL | Status: DC
  Administered 2020-10-26 – 2020-11-02 (×15): 500 mg via ORAL
  Filled 2020-10-26 (×16): qty 1

## 2020-10-26 MED ORDER — ZINC SULFATE 220 (50 ZN) MG PO CAPS
220.0000 mg | ORAL_CAPSULE | Freq: Every day | ORAL | Status: DC
  Administered 2020-10-26 – 2020-11-03 (×9): 220 mg via ORAL
  Filled 2020-10-26 (×9): qty 1

## 2020-10-26 MED ORDER — DM-GUAIFENESIN ER 30-600 MG PO TB12
1.0000 | ORAL_TABLET | Freq: Two times a day (BID) | ORAL | Status: DC
  Administered 2020-10-27 – 2020-11-02 (×14): 1 via ORAL
  Filled 2020-10-26 (×17): qty 1

## 2020-10-26 NOTE — ED Notes (Signed)
This RN to bedside for rounding. Pt up and in restroom and this time. This RN notes an SpO2 of 84% post ambulation to the restroom, roughly 10 feet. Pt assisted back into bed, provided clean linens and warm blankets at this time. All questions and concerns voiced addressed at this time. Bed is locked in the lowest position, side rails x2, call bell within reach. Vital signs cycling q30 minutes at this time.

## 2020-10-26 NOTE — ED Notes (Signed)
This RN rounded on patient at this time. Pt sitting up in bed, eating meal tray and provided with a cup of ice water per request. This RN found oxygen to be sitting on the bed, placed back on patient and educated on the need and purpose. Pt in agreement at this time. All questions and concerns voiced addressed. Will continue to monitor at this time.

## 2020-10-26 NOTE — ED Notes (Signed)
Pt provided with a dinner tray at this time. Pt currently tolerating PO intake without distress. Bed is locked in the lowest position, side rails x2, call bell within reach. Educated on current plan of care, all questions and concerns voiced addressed at this time. Vital signs cycling q30 minutes, will continue to monitor at this time.

## 2020-10-26 NOTE — ED Notes (Signed)
Entered room and introduced self to patient. Pt appears to be resting in bed, respirations are even and unlabored with equal chest rise and fall. Bed is locked in the lowest position, side rails x2, call bell within reach. Pt educated on call light use and hourly rounding, verbalized understanding and in agreement at this time. All questions and concerns voiced addressed. Refreshments offered and provided per patient request. Cardiac monitor in place with vital signs cycling q30 minutes. Echo at bedside at this time.  Will continue to monitor.

## 2020-10-26 NOTE — Progress Notes (Signed)
*  PRELIMINARY RESULTS* Echocardiogram 2D Echocardiogram has been performed.  Leavy Cella 10/26/2020, 11:27 AM

## 2020-10-26 NOTE — Progress Notes (Addendum)
Patient Demographics:    Lawrence [name], is a 69 y.o. male, DOB - [birth year], PJA:[id number]  Admit date - 10/18/2020   Admitting Physician Ritchard Paragas Denton Brick, MD  Outpatient Primary MD for the patient is [name], MD  LOS - 1   Chief Complaint  Patient presents with  . syncope        Subjective:    Lawrence [name] today has nausea, but no emesis,  No chest pain,    -Cough, shortness of breath and myalgias persist  Assessment  & Plan :    Principal Problem:   Pneumonia due to COVID-19 virus Active Problems:   Acute respiratory failure with hypoxia (HCC)   Atrial fibrillation, chronic (HCC)   Hyperglycemia   Syncope and collapse  Brief Summary:- 69 y.o. male with medical history significant for aortic insufficiency,   A. fib, h/o aortic aneurysm admitted on 11/09/2020 with COVID-19 infection/pneumonia with hypoxic respiratory failure and concerns about possible syncopal episode PTA -Patient initially tested positive for COVID-19 on 10/15/2020 -Patient is NOT vaccinated against COVID-19   A/p 1)Acute hypoxic respiratory failure secondary to COVID-19 infection/Pneumonia--- The treatment plan and use of medications  for treatment of COVID-19 infection and possible side effects were discussed with patient -Currently requiring 6 L of oxygen via nasal cannula -----Patient verbalizes understanding and agrees to treatment protocols   --Patient is positive for COVID-19 infection, chest x-ray with findings of infiltrates/opacities,  patient is tachypneic/hypoxic and requiring continuous supplemental oxygen---patient meets criteria for initiation of Remdesivir AND Steroid therapy per protocol  --Check and trend inflammatory markers including D-dimer, ferritin and  CRP---also follow CBC and CMP --Supplemental oxygen to keep O2 sats above 93% -Follow serial chest x-rays and ABGs as indicated --- Encourage  prone positioning for More than 16 hours/day in increments of 2 to 3 hours at a time if able to tolerate --Attempt to maintain euvolemic state --Zinc and vitamin C as ordered -Albuterol inhaler as needed -Accu-Cheks/fingersticks while on high-dose steroids -PPI while on high-dose steroids - COVID-19 Labs  Recent Labs    10/19/2020 2130 10/26/20 0342  DDIMER 0.92* 1.10*  FERRITIN 940* 822*  LDH 184  --   CRP 16.2* 15.7*    Lab Results  Component Value Date   SARSCOV2NAA POSITIVE (A) 10/14/2020    2)Possible Syncope--- patient is a poor historian, echo with preserved EF of 55 to 60%, no wall motion normalities or significant aortic stenosis -Troponin is not elevated, D-dimer mildly elevated most likely due to COVID-19 infection rather than PE -Continue to monitor closely  3)Social/Ethics--patient is partial code, no intubation but okay for CPR and ACLS drugs  4)Hyperglycemia-A1c 6.1, no prior diagnosis of diabetes mellitus  --anticipate worsening hyperglycemia with steroids, Use Novolog/Humalog Sliding scale insulin with Accu-Cheks/Fingersticks as ordered  5) chronic atrial fibrillation--- continue metoprolol and Cardizem for rate control, -Patient was not on full anticoagulation PTA, defer decision to PCP  6)HLD--- continue Lipitor  Disposition/Need for in-Hospital Stay- patient unable to be discharged at this time due to --- hypoxic respiratory failure secondary to Covid pneumonia requiring IV steroids IV remdesivir and supplemental oxygen  Status is: Inpatient  Remains inpatient appropriate because:Please see above   Disposition: The patient is from: Home  Anticipated d/c is to: Home              Anticipated d/c date is: 3 days              Patient currently is not medically stable to d/c. Barriers: Not Clinically Stable-   Code Status :  -  Code Status: Partial Code   Family Communication:    NA (patient is alert, awake and coherent)   Consults  :   na  DVT Prophylaxis  :   - SCDs  enoxaparin (LOVENOX) injection 40 mg Start: 10/12/2020 2215 SCDs Start: 10/11/2020 2204    Lab Results  Component Value Date   PLT 181 10/26/2020    Inpatient Medications  Scheduled Meds: . albuterol  2 puff Inhalation Q6H  . vitamin C  500 mg Oral BID  . atorvastatin  20 mg Oral Daily  . dextromethorphan-guaiFENesin  1 tablet Oral BID  . diltiazem  120 mg Oral Daily  . enoxaparin (LOVENOX) injection  40 mg Subcutaneous Q24H  . insulin aspart  0-15 Units Subcutaneous TID WC  . insulin aspart  0-5 Units Subcutaneous QHS  . methylPREDNISolone (SOLU-MEDROL) injection  0.5 mg/kg Intravenous Q12H   Followed by  . [START ON 10/29/2020] predniSONE  50 mg Oral Daily  . zinc sulfate  220 mg Oral Daily   Continuous Infusions: . remdesivir 100 mg in NS 100 mL Stopped (10/26/20 1104)   PRN Meds:.acetaminophen, guaiFENesin-dextromethorphan, ondansetron **OR** ondansetron (ZOFRAN) IV    Anti-infectives (From admission, onward)   Start     Dose/Rate Route Frequency Ordered Stop   10/26/20 1000  remdesivir 100 mg in sodium chloride 0.9 % 100 mL IVPB  Status:  Discontinued       "Followed by" Linked Group Details   100 mg 200 mL/hr over 30 Minutes Intravenous Daily 11/02/2020 2203 11/07/2020 2210   10/26/20 1000  remdesivir 100 mg in sodium chloride 0.9 % 100 mL IVPB        100 mg 200 mL/hr over 30 Minutes Intravenous Daily 10/22/2020 2130 10/30/20 0959   10/28/2020 2145  remdesivir 100 mg in sodium chloride 0.9 % 100 mL IVPB  Status:  Discontinued        100 mg 200 mL/hr over 30 Minutes Intravenous Daily 10/14/2020 2130 10/16/2020 2131   10/21/2020 2145  remdesivir 100 mg in sodium chloride 0.9 % 100 mL IVPB        100 mg 200 mL/hr over 30 Minutes Intravenous Every hour 10/24/2020 2131 10/26/20 0030   11/04/2020 2130  remdesivir 200 mg in sodium chloride 0.9% 250 mL IVPB  Status:  Discontinued       "Followed by" Linked Group Details   200 mg 580 mL/hr over 30 Minutes  Intravenous Once 11/03/2020 2203 10/16/2020 2210   10/18/2020 2045  cefTRIAXone (ROCEPHIN) 1 g in sodium chloride 0.9 % 100 mL IVPB        1 g 200 mL/hr over 30 Minutes Intravenous  Once 10/14/2020 2033 10/26/20 0058   10/12/2020 2045  azithromycin (ZITHROMAX) 500 mg in sodium chloride 0.9 % 250 mL IVPB        500 mg 250 mL/hr over 60 Minutes Intravenous  Once 10/26/2020 2033 11/02/2020 2245        Objective:   Vitals:   10/26/20 1451 10/26/20 1500 10/26/20 1530 10/26/20 1800  BP:  127/88 131/83 (!) 140/91  Pulse:  71 75 66  Resp:  (!) 28 20 (!) 22  Temp:  98.4 F (36.9 C)  TempSrc:    Oral  SpO2: (!) 89% 90% (!) 86% (!) 89%  Weight:      Height:        Wt Readings from Last 3 Encounters:  11/01/2020 97.1 kg  08/10/20 93.3 kg  05/10/20 96.2 kg    No intake or output data in the 24 hours ending 10/26/20 1911   Physical Exam  Gen:- Awake Alert, coherent HEENT:- [place].AT, No sclera icterus Nose-[place] 6L/min Neck-Supple Neck,No JVD,.  Lungs-diminished breath sounds, no significant wheezing  CV- S1, S2 normal, irregular  Abd-  +ve B.Sounds, Abd Soft, No tenderness,    Extremity/Skin:- No  edema, pedal pulses present  Psych-affect is appropriate, oriented x3 Neuro-generalized weakness, no new focal deficits, no tremors   Data Review:   Micro Results Recent Results (from the past 240 hour(s))  Resp panel by RT-PCR (RSV, Flu A&B, Covid) Nasopharyngeal Swab     Status: Abnormal   Collection Time: 10/27/2020  7:40 PM   Specimen: Nasopharyngeal Swab; Nasopharyngeal(NP) swabs in vial transport medium  Result Value Ref Range Status   SARS Coronavirus 2 by RT PCR POSITIVE (A) NEGATIVE Final    Comment: RESULT CALLED TO, READ BACK BY AND VERIFIED WITH: K BELTON,RN@2052  10/24/2020 MKELLY (NOTE) SARS-CoV-2 target nucleic acids are DETECTED.  The SARS-CoV-2 RNA is generally detectable in upper respiratory specimens during the acute phase of infection. Positive results are indicative of the  presence of the identified virus, but do not rule out bacterial infection or co-infection with other pathogens not detected by the test. Clinical correlation with patient history and other diagnostic information is necessary to determine patient infection status. The expected result is Negative.  Fact Sheet for Patients: EntrepreneurPulse.com.au  Fact Sheet for Healthcare Providers: IncredibleEmployment.be  This test is not yet approved or cleared by the [place] FDA and  has been authorized for detection and/or diagnosis of SARS-CoV-2 by FDA under an Emergency Use Authorization (EUA).  This EUA will remain in effect (meaning this test can be  used) for the duration of  the COVID-19 declaration under Section 564(b)(1) of the Act, 21 U.S.C. section 360bbb-3(b)(1), unless the authorization is terminated or revoked sooner.     Influenza A by PCR NEGATIVE NEGATIVE Final   Influenza B by PCR NEGATIVE NEGATIVE Final    Comment: (NOTE) The Xpert Xpress SARS-CoV-2/FLU/RSV plus assay is intended as an aid in the diagnosis of influenza from Nasopharyngeal swab specimens and should not be used as a sole basis for treatment. Nasal washings and aspirates are unacceptable for Xpert Xpress SARS-CoV-2/FLU/RSV testing.  Fact Sheet for Patients: EntrepreneurPulse.com.au  Fact Sheet for Healthcare Providers: IncredibleEmployment.be  This test is not yet approved or cleared by the [place] FDA and has been authorized for detection and/or diagnosis of SARS-CoV-2 by FDA under an Emergency Use Authorization (EUA). This EUA will remain in effect (meaning this test can be used) for the duration of the COVID-19 declaration under Section 564(b)(1) of the Act, 21 U.S.C. section 360bbb-3(b)(1), unless the authorization is terminated or revoked.     Resp Syncytial Virus by PCR NEGATIVE NEGATIVE Final    Comment:  (NOTE) Fact Sheet for Patients: EntrepreneurPulse.com.au  Fact Sheet for Healthcare Providers: IncredibleEmployment.be  This test is not yet approved or cleared by the [place] FDA and has been authorized for detection and/or diagnosis of SARS-CoV-2 by FDA under an Emergency Use Authorization (EUA). This EUA will remain in effect (meaning this test can be used)  for the duration of the COVID-19 declaration under Section 564(b)(1) of the Act, 21 U.S.C. section 360bbb-3(b)(1), unless the authorization is terminated or revoked.  Performed at [facility] [address], [place], [place] [address]   Blood Culture (routine x 2)     Status: None (Preliminary result)   Collection Time: 10/17/2020  9:30 PM   Specimen: Right Antecubital; Blood  Result Value Ref Range Status   Specimen Description RIGHT ANTECUBITAL  Final   Special Requests   Final    BOTTLES DRAWN AEROBIC AND ANAEROBIC Blood Culture adequate volume   Culture   Final    NO GROWTH < 12 HOURS Performed at [facility], [address] [place], [place] [address]    Report Status PENDING  Incomplete  Blood Culture (routine x 2)     Status: None (Preliminary result)   Collection Time: 11/10/2020 10:33 PM   Specimen: BLOOD LEFT HAND  Result Value Ref Range Status   Specimen Description BLOOD LEFT HAND  Final   Special Requests   Final    BOTTLES DRAWN AEROBIC AND ANAEROBIC Blood Culture adequate volume   Culture   Final    NO GROWTH < 12 HOURS Performed at [facility], [address], [place], [place] [address]    Report Status PENDING  Incomplete    Radiology Reports DG Chest Port 1 View  Result Date: 10/19/2020 CLINICAL DATA:  Cough.  Shortness of breath.  Chest pain. EXAM: PORTABLE CHEST 1 VIEW COMPARISON:  Radiograph 08/27/2018. CT 07/08/2019 FINDINGS: Low lung volumes. Post median sternotomy. Heart is normal in size. Similar aortic tortuosity. Mild peribronchial no interstitial  thickening. Superimposed patchy opacity at the lung bases and right mid lung. No pneumothorax or pleural effusion. No acute osseous abnormalities are seen. IMPRESSION: 1. Patchy opacity at the lung bases and right mid lung, suspicious for atelectasis versus pneumonia in the setting of cough. 2. Mild peribronchial thickening. Electronically Signed   By: Keith Rake M.D.   On: 10/21/2020 20:12   ECHOCARDIOGRAM COMPLETE  Result Date: 10/26/2020    ECHOCARDIOGRAM REPORT   Patient Name:   Lawrence [name] Date of Exam: 10/26/2020 Medical Rec #:  [id number]       Height:       71.0 in Accession #:    [id number]      Weight:       214.0 lb Date of Birth:  17-Nov-1950        BSA:          2.170 m Patient Age:    9 years        BP:           169/94 mmHg Patient Gender: M               HR:           49 bpm. Exam Location:  [facility] Procedure: 2D Echo Indications:    Syncope 780.2 / R55  History:        Patient has prior history of Echocardiogram examinations, most                 recent 08/10/2018. Arrythmias:Atrial Fibrillation,                 Signs/Symptoms:Syncope; Risk Factors:Hypertension and Former                 Smoker. Pneumonia due to [place], Aortic root aneurysm.  Sonographer:    Leavy Cella RDCS (AE) Referring Phys:  [id number] OLADAPO ADEFESO IMPRESSIONS  1. Left ventricular ejection fraction, by estimation, is 55 to 60%. The left ventricle has normal function. The left ventricle has no regional wall motion abnormalities. There is mild left ventricular hypertrophy. Left ventricular diastolic parameters are indeterminate.  2. Right ventricular systolic function is normal. The right ventricular size is normal. Tricuspid regurgitation signal is inadequate for assessing PA pressure.  3. The mitral valve is grossly normal. Trivial mitral valve regurgitation.  4. The aortic valve is bicuspid. There is mild calcification of the aortic valve. Aortic valve regurgitation is trivial. Mild to moderate aortic valve  sclerosis/calcification is present, without any evidence of aortic stenosis. Aortic valve mean gradient measures 7.3 mmHg. Aortic valve Vmax measures 1.96 m/s.  5. 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 Aortic dilatation noted. There is mild dilatation of the aortic root, measuring 42 mm based on various views.  6. The inferior vena cava is normal in size with greater than 50% respiratory variability, suggesting right atrial pressure of 3 mmHg. FINDINGS  Left Ventricle: Left ventricular ejection fraction, by estimation, is 55 to 60%. The left ventricle has normal function. The left ventricle has no regional wall motion abnormalities. The left ventricular internal cavity size was normal in size. There is  mild left ventricular hypertrophy. Left ventricular diastolic parameters are indeterminate. Right Ventricle: The right ventricular size is normal. No increase in right ventricular wall thickness. Right ventricular systolic function is normal. Tricuspid regurgitation signal is inadequate for assessing PA pressure. Left Atrium: Left atrial size was normal in size. Right Atrium: Right atrial size was normal in size. Pericardium: There is no evidence of pericardial effusion. Mitral Valve: The mitral valve is grossly normal. Trivial mitral valve regurgitation. Tricuspid Valve: The tricuspid valve is grossly normal. Tricuspid valve regurgitation is trivial. Aortic Valve: The aortic valve is bicuspid. There is mild calcification of the aortic valve. There is mild aortic valve annular calcification. Aortic valve regurgitation is trivial. Mild to moderate aortic valve sclerosis/calcification is present, without any evidence of aortic stenosis. Aortic valve mean gradient measures 7.3 mmHg. Aortic valve peak gradient measures 15.4 mmHg. Aortic valve area, by VTI measures 2.83 cm. Pulmonic Valve: The pulmonic valve was grossly normal. Pulmonic valve regurgitation is trivial. Aorta: Status supracoronary  placement of 32 mm Hemashield graft. Aortic dilatation noted. There is mild dilatation of the aortic root, measuring 42 mm. Venous: The inferior vena cava is normal in size with greater than 50% respiratory variability, suggesting right atrial pressure of 3 mmHg. IAS/Shunts: No atrial level shunt detected by color flow Doppler.  LEFT VENTRICLE PLAX 2D LVIDd:         4.40 cm  Diastology LVIDs:         2.93 cm  LV e' medial:    5.98 cm/s LV PW:         1.14 cm  LV E/e' medial:  7.5 LV IVS:        0.90 cm  LV e' lateral:   8.92 cm/s LVOT diam:     2.20 cm  LV E/e' lateral: 5.0 LV SV:         104 LV SV Index:   48 LVOT Area:     3.80 cm  RIGHT VENTRICLE RV S prime:     12.80 cm/s TAPSE (M-mode): 2.9 cm LEFT ATRIUM             Index       RIGHT ATRIUM  Index LA diam:        3.20 cm 1.47 cm/m  RA Area:     17.70 cm LA Vol (A2C):   42.6 ml 19.63 ml/m RA Volume:   46.10 ml  21.24 ml/m LA Vol (A4C):   18.6 ml 8.57 ml/m LA Biplane Vol: 29.2 ml 13.46 ml/m  AORTIC VALVE AV Area (Vmax):    2.33 cm AV Area (Vmean):   2.60 cm AV Area (VTI):     2.83 cm AV Vmax:           196.33 cm/s AV Vmean:          125.333 cm/s AV VTI:            0.369 m AV Peak Grad:      15.4 mmHg AV Mean Grad:      7.3 mmHg LVOT Vmax:         120.33 cm/s LVOT Vmean:        85.800 cm/s LVOT VTI:          0.274 m LVOT/AV VTI ratio: 0.74  AORTA Ao Root diam: 3.80 cm MITRAL VALVE MV Area (PHT): 2.22 cm    SHUNTS MV Decel Time: 342 msec    Systemic VTI:  0.27 m MV E velocity: 44.60 cm/s  Systemic Diam: 2.20 cm MV A velocity: 54.80 cm/s MV E/A ratio:  0.81 [name] MD Electronically signed by [name] MD Signature Date/Time: 10/26/2020/12:33:59 PM    Final      CBC Recent Labs  Lab 10/16/2020 1427 10/26/20 0342  WBC 8.0 6.1  HGB 19.5* 17.0  HCT 57.8* 51.4  PLT 160 181  MCV 93.4 94.1  MCH 31.5 31.1  MCHC 33.7 33.1  RDW 13.0 13.2  LYMPHSABS  --  0.5*  MONOABS  --  0.1  EOSABS  --  0.0  BASOSABS  --  0.0     Chemistries  Recent Labs  Lab 11/02/2020 1427 10/26/20 0342  NA 135 136  K 4.2 3.8  CL 97* 102  CO2 24 23  GLUCOSE 219* 209*  BUN 39* 38*  CREATININE 1.22 0.95  CALCIUM 9.4 8.9  MG  --  2.5*  AST  --  34  ALT  --  40  ALKPHOS  --  51  BILITOT  --  0.9   ------------------------------------------------------------------------------------------------------------------ Recent Labs    11/07/2020 2130  TRIG 60    Lab Results  Component Value Date   HGBA1C 6.1 (H) 10/19/2020   ------------------------------------------------------------------------------------------------------------------ No results for input(s): TSH, T4TOTAL, T3FREE, THYROIDAB in the last 72 hours.  Invalid input(s): FREET3 ------------------------------------------------------------------------------------------------------------------ Recent Labs    10/11/2020 2130 10/26/20 0342  FERRITIN 940* 822*    Coagulation profile No results for input(s): INR, PROTIME in the last 168 hours.  Recent Labs    10/19/2020 2130 10/26/20 0342  DDIMER 0.92* 1.10*    Cardiac Enzymes No results for input(s): CKMB, TROPONINI, MYOGLOBIN in the last 168 hours.  Invalid input(s): CK ------------------------------------------------------------------------------------------------------------------ No results found for: BNP   Roxan Hockey M.D on 10/26/2020 at 7:11 PM  Go to [email or web address] - for contact info  [facility] - Office  [phone]

## 2020-10-26 NOTE — ED Notes (Signed)
This RN rounded on patient. On arrival, pt reports "Is there anyway that we can turn this oxygen down? I feel like I'm getting blown away." This RN notes an SpO2 of 88% on 15L HFNC. This RN titrated patient to 11L HFNC and notes an SpO2 of 85-89% at this time. Pt reports an improvement, no signs of worsening shortness of breath noted. Will continue to monitor at this time.

## 2020-10-27 LAB — COMPREHENSIVE METABOLIC PANEL
ALT: 64 U/L — ABNORMAL HIGH (ref 0–44)
ALT: 73 U/L — ABNORMAL HIGH (ref 0–44)
AST: 51 U/L — ABNORMAL HIGH (ref 15–41)
AST: 56 U/L — ABNORMAL HIGH (ref 15–41)
Albumin: 3.3 g/dL — ABNORMAL LOW (ref 3.5–5.0)
Albumin: 3.1 g/dL — ABNORMAL LOW (ref 3.5–5.0)
Alkaline Phosphatase: 46 U/L (ref 38–126)
Alkaline Phosphatase: 51 U/L (ref 38–126)
Anion gap: 14 (ref 5–15)
Anion gap: 13 (ref 5–15)
BUN: 48 mg/dL — ABNORMAL HIGH (ref 8–23)
BUN: 47 mg/dL — ABNORMAL HIGH (ref 8–23)
CO2: 21 mmol/L — ABNORMAL LOW (ref 22–32)
CO2: 22 mmol/L (ref 22–32)
Calcium: 9 mg/dL (ref 8.9–10.3)
Calcium: 9.2 mg/dL (ref 8.9–10.3)
Chloride: 102 mmol/L (ref 98–111)
Chloride: 101 mmol/L (ref 98–111)
Creatinine, Ser: 1.04 mg/dL (ref 0.61–1.24)
Creatinine, Ser: 0.99 mg/dL (ref 0.61–1.24)
GFR, Estimated: >60 mL/min (ref >60)
GFR, Estimated: >60 mL/min (ref >60)
Glucose, Bld: 277 mg/dL — ABNORMAL HIGH (ref 70–99)
Glucose, Bld: 245 mg/dL — ABNORMAL HIGH (ref 70–99)
Potassium: 4.2 mmol/L (ref 3.5–5.1)
Potassium: 4.5 mmol/L (ref 3.5–5.1)
Sodium: 137 mmol/L (ref 135–145)
Sodium: 136 mmol/L (ref 135–145)
Total Bilirubin: 0.7 mg/dL (ref 0.3–1.2)
Total Bilirubin: 0.8 mg/dL (ref 0.3–1.2)
Total Protein: 6.3 g/dL — ABNORMAL LOW (ref 6.5–8.1)
Total Protein: 6.1 g/dL — ABNORMAL LOW (ref 6.5–8.1)

## 2020-10-27 LAB — CBC WITH DIFFERENTIAL/PLATELET
Abs Immature Granulocytes: 0.04 10*3/uL (ref 0.00–0.07)
Basophils Absolute: 0 10*3/uL (ref 0.0–0.1)
Basophils Relative: 0 %
Eosinophils Absolute: 0 10*3/uL (ref 0.0–0.5)
Eosinophils Relative: 0 %
HCT: 49.8 % (ref 39.0–52.0)
Hemoglobin: 16.8 g/dL (ref 13.0–17.0)
Immature Granulocytes: 0 %
Lymphocytes Relative: 6 %
Lymphs Abs: 0.6 10*3/uL — ABNORMAL LOW (ref 0.7–4.0)
MCH: 30.9 pg (ref 26.0–34.0)
MCHC: 33.7 g/dL (ref 30.0–36.0)
MCV: 91.5 fL (ref 80.0–100.0)
Monocytes Absolute: 0.3 10*3/uL (ref 0.1–1.0)
Monocytes Relative: 4 %
Neutro Abs: 8.5 10*3/uL — ABNORMAL HIGH (ref 1.7–7.7)
Neutrophils Relative %: 90 %
Platelets: 201 10*3/uL (ref 150–400)
RBC: 5.44 MIL/uL (ref 4.22–5.81)
RDW: 12.9 % (ref 11.5–15.5)
WBC: 9.4 10*3/uL (ref 4.0–10.5)
nRBC: 0 % (ref 0.0–0.2)

## 2020-10-27 LAB — GLUCOSE, CAPILLARY
Glucose-Capillary: 220 mg/dL — ABNORMAL HIGH (ref 70–99)
Glucose-Capillary: 232 mg/dL — ABNORMAL HIGH (ref 70–99)
Glucose-Capillary: 226 mg/dL — ABNORMAL HIGH (ref 70–99)
Glucose-Capillary: 175 mg/dL — ABNORMAL HIGH (ref 70–99)

## 2020-10-27 LAB — PROCALCITONIN: Procalcitonin: <0.1 ng/mL

## 2020-10-27 LAB — PHOSPHORUS: Phosphorus: 3.6 mg/dL (ref 2.5–4.6)

## 2020-10-27 LAB — C-REACTIVE PROTEIN: CRP: 5.9 mg/dL — ABNORMAL HIGH (ref <1.0)

## 2020-10-27 LAB — FERRITIN: Ferritin: 1106 ng/mL — ABNORMAL HIGH (ref 24–336)

## 2020-10-27 LAB — D-DIMER, QUANTITATIVE: D-Dimer, Quant: 0.7 ug/mL-FEU — ABNORMAL HIGH (ref 0.00–0.50)

## 2020-10-27 LAB — MAGNESIUM: Magnesium: 2.4 mg/dL (ref 1.7–2.4)

## 2020-10-27 MED ORDER — METHYLPREDNISOLONE SODIUM SUCC 125 MG IJ SOLR
60.0000 mg | Freq: Two times a day (BID) | INTRAMUSCULAR | Status: DC
  Administered 2020-10-27 – 2020-11-04 (×17): 60 mg via INTRAVENOUS
  Filled 2020-10-27 (×17): qty 2

## 2020-10-27 NOTE — ED Notes (Signed)
[facility] arrived to transport pt 

## 2020-10-27 NOTE — Progress Notes (Signed)
Report called to Stacy at 1900.  Will report this information to oncoming shift and assist with transport if needed.

## 2020-10-27 NOTE — Progress Notes (Signed)
PROGRESS NOTE    Lawrence [name]  HER:[id number] DOB: 1951/05/16 DOA: 10/29/2020 PCP: [name], MD   Brief Narrative: Taken from prior notes. 69 y.o.malewith medical history significant foraortic insufficiency,  A. fib, h/o aortic aneurysm admitted on 10/27/2020 with COVID-19 infection/pneumonia with hypoxic respiratory failure and concerns about possible syncopal episode PTA Patient initially tested positive for COVID-19 on 10/15/2020 Patient is NOT vaccinated against COVID-19 Apparently transferred from [facility] to [facility], may be secondary to increased oxygen requirement. Patient continued to desaturate at 15 L-transferring to progressive care unit for heated high flow.  Subjective: Patient was very upset that he cannot take shower, stating that if we will not let him take shower he will leave AMA.  Nursing staff is not comfortable helping him with shower while he was on high oxygen requirement.  He was desaturating in 70s at 15 L. Discussed about starting baricitinib, patient was little reluctant stating that he will ask his cardiologist before making any decision.  Assessment & Plan:   Principal Problem:   Pneumonia due to COVID-19 virus Active Problems:   Atrial fibrillation, chronic (HCC)   Acute respiratory failure with hypoxia (HCC)   Hyperglycemia   Syncope and collapse  Acute hypoxic respiratory failure secondary to COVID-19 pneumonia. Patient with worsening hypoxia, some improvement in CRP, initial procalcitonin negative. -Transfer him to progressive care unit for heated high flow. -Continue with supplemental oxygen to keep the saturation above 88%. -Continue remdesivir-day 3 -Change steroid with Solu-Medrol 60 mg twice daily. -Continue with supportive care. -Continue with supplements. -Encourage proning. -Incentive spirometry and flutter valve. -Continue with PPI.  Questionable syncopal episode.  EKG, echocardiogram and troponin all within normal  limit.  D-dimer mildly elevated secondary to COVID-19 infection. -Continue to moniter.  Hyperglycemia. A1c of 6.1,no prior diagnosis of diabetes. -SSI while patient is on steroids.  Chronic atrial fibrillation. Currently rate controlled, not on any chronic anticoagulation at home. -Continue home dose of metoprolol and Cardizem.  Hyperlipidemia. -Continue statin.  Objective: Vitals:   10/27/20 0000 10/27/20 0159 10/27/20 0532 10/27/20 0940  BP: (!) 132/93 133/81 (!) 139/95 133/83  Pulse: 64 66 64 80  Resp: 20 (!) 21 15 19   Temp:  97.7 F (36.5 C) 97.7 F (36.5 C) 98.2 F (36.8 C)  TempSrc:  Oral Oral Oral  SpO2: 91% 90% (!) 87% (!) 88%  Weight:      Height:        Intake/Output Summary (Last 24 hours) at 10/27/2020 1420 Last data filed at 10/27/2020 1300 Gross per 24 hour  Intake 580.65 ml  Output 700 ml  Net -119.35 ml   Filed Weights   10/31/2020 1322  Weight: 97.1 kg    Examination:  General exam: Appears calm and comfortable  Respiratory system: Bilateral scattered crackles. Respiratory effort normal. Cardiovascular system: S1 & S2 heard, RRR.  Gastrointestinal system: Soft, nontender, nondistended, bowel sounds positive. Central nervous system: Alert and oriented. No focal neurological deficits.Symmetric 5 x 5 power. Extremities: No edema, no cyanosis, pulses intact and symmetrical. Skin: No rashes, lesions or ulcers Psychiatry: Judgement and insight appear normal.     DVT prophylaxis: Lovenox Code Status: Partial, DNI Family Communication:  Disposition Plan:  Status is: Inpatient  Remains inpatient appropriate because:Inpatient level of care appropriate due to severity of illness   Dispo: The patient is from: Home              Anticipated d/c is to: Home  Anticipated d/c date is: 2 days              Patient currently is not medically stable to d/c.  Consultants:   None  Procedures:  Antimicrobials:   Data Reviewed: I have  personally reviewed following labs and imaging studies  CBC: Recent Labs  Lab 11/07/2020 1427 10/26/20 0342 10/27/20 0406  WBC 8.0 6.1 9.4  NEUTROABS  --  5.4 8.5*  HGB 19.5* 17.0 16.8  HCT 57.8* 51.4 49.8  MCV 93.4 94.1 91.5  PLT 160 181 263   Basic Metabolic Panel: Recent Labs  Lab 10/19/2020 1427 10/26/20 0342 10/27/20 0822  NA 135 136 136  K 4.2 3.8 4.5  CL 97* 102 101  CO2 24 23 22   GLUCOSE 219* 209* 245*  BUN 39* 38* 47*  CREATININE 1.22 0.95 0.99  CALCIUM 9.4 8.9 9.2  MG  --  2.5*  --   PHOS  --  3.2  --    GFR: Estimated Creatinine Clearance: 84.8 mL/min (by C-G formula based on SCr of 0.99 mg/dL). Liver Function Tests: Recent Labs  Lab 10/26/20 0342 10/27/20 0822  AST 34 56*  ALT 40 73*  ALKPHOS 51 51  BILITOT 0.9 0.8  PROT 7.2 6.1*  ALBUMIN 3.5 3.1*   No results for input(s): LIPASE, AMYLASE in the last 168 hours. No results for input(s): AMMONIA in the last 168 hours. Coagulation Profile: No results for input(s): INR, PROTIME in the last 168 hours. Cardiac Enzymes: No results for input(s): CKTOTAL, CKMB, CKMBINDEX, TROPONINI in the last 168 hours. BNP (last 3 results) No results for input(s): PROBNP in the last 8760 hours. HbA1C: Recent Labs    10/12/2020 2130  HGBA1C 6.1*   CBG: Recent Labs  Lab 10/26/20 1151 10/26/20 1649 10/26/20 2217 10/27/20 0752 10/27/20 1059  GLUCAP 212* 211* 185* 220* 232*   Lipid Profile: Recent Labs    11/07/2020 2130  TRIG 60   Thyroid Function Tests: No results for input(s): TSH, T4TOTAL, FREET4, T3FREE, THYROIDAB in the last 72 hours. Anemia Panel: Recent Labs    10/26/20 0342 10/27/20 0822  FERRITIN 822* 1,106*   Sepsis Labs: Recent Labs  Lab 11/05/2020 2130 10/26/20 0014  PROCALCITON <0.10  --   LATICACIDVEN 1.6 1.1    Recent Results (from the past 240 hour(s))  Resp panel by RT-PCR (RSV, Flu A&B, Covid) Nasopharyngeal Swab     Status: Abnormal   Collection Time: 11/01/2020  7:40 PM    Specimen: Nasopharyngeal Swab; Nasopharyngeal(NP) swabs in vial transport medium  Result Value Ref Range Status   SARS Coronavirus 2 by RT PCR POSITIVE (A) NEGATIVE Final    Comment: RESULT CALLED TO, READ BACK BY AND VERIFIED WITH: K BELTON,RN@2052  10/27/2020 MKELLY (NOTE) SARS-CoV-2 target nucleic acids are DETECTED.  The SARS-CoV-2 RNA is generally detectable in upper respiratory specimens during the acute phase of infection. Positive results are indicative of the presence of the identified virus, but do not rule out bacterial infection or co-infection with other pathogens not detected by the test. Clinical correlation with patient history and other diagnostic information is necessary to determine patient infection status. The expected result is Negative.  Fact Sheet for Patients: EntrepreneurPulse.com.au  Fact Sheet for Healthcare Providers: IncredibleEmployment.be  This test is not yet approved or cleared by the [place] FDA and  has been authorized for detection and/or diagnosis of SARS-CoV-2 by FDA under an Emergency Use Authorization (EUA).  This EUA will remain in effect (meaning this test  can be  used) for the duration of  the COVID-19 declaration under Section 564(b)(1) of the Act, 21 U.S.C. section 360bbb-3(b)(1), unless the authorization is terminated or revoked sooner.     Influenza A by PCR NEGATIVE NEGATIVE Final   Influenza B by PCR NEGATIVE NEGATIVE Final    Comment: (NOTE) The Xpert Xpress SARS-CoV-2/FLU/RSV plus assay is intended as an aid in the diagnosis of influenza from Nasopharyngeal swab specimens and should not be used as a sole basis for treatment. Nasal washings and aspirates are unacceptable for Xpert Xpress SARS-CoV-2/FLU/RSV testing.  Fact Sheet for Patients: EntrepreneurPulse.com.au  Fact Sheet for Healthcare Providers: IncredibleEmployment.be  This test is not yet  approved or cleared by the [place] FDA and has been authorized for detection and/or diagnosis of SARS-CoV-2 by FDA under an Emergency Use Authorization (EUA). This EUA will remain in effect (meaning this test can be used) for the duration of the COVID-19 declaration under Section 564(b)(1) of the Act, 21 U.S.C. section 360bbb-3(b)(1), unless the authorization is terminated or revoked.     Resp Syncytial Virus by PCR NEGATIVE NEGATIVE Final    Comment: (NOTE) Fact Sheet for Patients: EntrepreneurPulse.com.au  Fact Sheet for Healthcare Providers: IncredibleEmployment.be  This test is not yet approved or cleared by the [place] FDA and has been authorized for detection and/or diagnosis of SARS-CoV-2 by FDA under an Emergency Use Authorization (EUA). This EUA will remain in effect (meaning this test can be used) for the duration of the COVID-19 declaration under Section 564(b)(1) of the Act, 21 U.S.C. section 360bbb-3(b)(1), unless the authorization is terminated or revoked.  Performed at [facility], [address], [place], [place] [address]   Blood Culture (routine x 2)     Status: None (Preliminary result)   Collection Time: 10/26/2020  9:30 PM   Specimen: Right Antecubital; Blood  Result Value Ref Range Status   Specimen Description RIGHT ANTECUBITAL  Final   Special Requests   Final    BOTTLES DRAWN AEROBIC AND ANAEROBIC Blood Culture adequate volume   Culture   Final    NO GROWTH 2 DAYS Performed at [facility], [address], [place], [place] [address]    Report Status PENDING  Incomplete  Blood Culture (routine x 2)     Status: None (Preliminary result)   Collection Time: 10/26/2020 10:33 PM   Specimen: BLOOD LEFT HAND  Result Value Ref Range Status   Specimen Description BLOOD LEFT HAND  Final   Special Requests   Final    BOTTLES DRAWN AEROBIC AND ANAEROBIC Blood Culture adequate volume   Culture   Final    NO GROWTH 2  DAYS Performed at [facility], [address] [place], [place] [address]    Report Status PENDING  Incomplete     Radiology Studies: DG Chest Port 1 View  Result Date: 11/08/2020 CLINICAL DATA:  Cough.  Shortness of breath.  Chest pain. EXAM: PORTABLE CHEST 1 VIEW COMPARISON:  Radiograph 08/27/2018. CT 07/08/2019 FINDINGS: Low lung volumes. Post median sternotomy. Heart is normal in size. Similar aortic tortuosity. Mild peribronchial no interstitial thickening. Superimposed patchy opacity at the lung bases and right mid lung. No pneumothorax or pleural effusion. No acute osseous abnormalities are seen. IMPRESSION: 1. Patchy opacity at the lung bases and right mid lung, suspicious for atelectasis versus pneumonia in the setting of cough. 2. Mild peribronchial thickening. Electronically Signed   By: Keith Rake M.D.   On: 11/01/2020 20:12   ECHOCARDIOGRAM COMPLETE  Result Date: 10/26/2020    ECHOCARDIOGRAM REPORT   Patient Name:   Lawrence [name] Date of Exam: 10/26/2020 Medical Rec #:  [id number]       Height:       71.0 in Accession #:    [id number]      Weight:       214.0 lb Date of Birth:  [birth year]        BSA:          2.170 m Patient Age:    56 years        BP:           169/94 mmHg Patient Gender: M               HR:           49 bpm. Exam Location:  [facility] Procedure: 2D Echo Indications:    Syncope 780.2 / R55  History:        Patient has prior history of Echocardiogram examinations, most                 recent 08/10/2018. Arrythmias:Atrial Fibrillation,                 Signs/Symptoms:Syncope; Risk Factors:Hypertension and Former                 Smoker. Pneumonia due to [place], Aortic root aneurysm.  Sonographer:    Leavy Cella RDCS (AE) Referring Phys: [id number] OLADAPO ADEFESO IMPRESSIONS  1. Left ventricular ejection fraction, by estimation, is 55 to 60%. The left ventricle has normal function. The left ventricle has no regional wall motion abnormalities. There is mild left  ventricular hypertrophy. Left ventricular diastolic parameters are indeterminate.  2. Right ventricular systolic function is normal. The right ventricular size is normal. Tricuspid regurgitation signal is inadequate for assessing PA pressure.  3. The mitral valve is grossly normal. Trivial mitral valve regurgitation.  4. The aortic valve is bicuspid. There is mild calcification of the aortic valve. Aortic valve regurgitation is trivial. Mild to moderate aortic valve sclerosis/calcification is present, without any evidence of aortic stenosis. Aortic valve mean gradient measures 7.3 mmHg. Aortic valve Vmax measures 1.96 m/s.  5. 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 Aortic dilatation noted. There is mild dilatation of the aortic root, measuring 42 mm based on various views.  6. The inferior vena cava is normal in size with greater than 50% respiratory variability, suggesting right atrial pressure of 3 mmHg. FINDINGS  Left Ventricle: Left ventricular ejection fraction, by estimation, is 55 to 60%. The left ventricle has normal function. The left ventricle has no regional wall motion abnormalities. The left ventricular internal cavity size was normal in size. There is  mild left ventricular hypertrophy. Left ventricular diastolic parameters are indeterminate. Right Ventricle: The right ventricular size is normal. No increase in right ventricular wall thickness. Right ventricular systolic function is normal. Tricuspid regurgitation signal is inadequate for assessing PA pressure. Left Atrium: Left atrial size was normal in size. Right Atrium: Right atrial size was normal in size. Pericardium: There is no evidence of pericardial effusion. Mitral Valve: The mitral valve is grossly normal. Trivial mitral valve regurgitation. Tricuspid Valve: The tricuspid valve is grossly normal. Tricuspid valve regurgitation is trivial. Aortic Valve: The aortic valve is bicuspid. There is mild calcification of the aortic  valve. There is mild aortic valve annular calcification. Aortic valve regurgitation is trivial. Mild to moderate aortic valve sclerosis/calcification is present, without any evidence  of aortic stenosis. Aortic valve mean gradient measures 7.3 mmHg. Aortic valve peak gradient measures 15.4 mmHg. Aortic valve area, by VTI measures 2.83 cm. Pulmonic Valve: The pulmonic valve was grossly normal. Pulmonic valve regurgitation is trivial. Aorta: Status supracoronary placement of 32 mm Hemashield graft. Aortic dilatation noted. There is mild dilatation of the aortic root, measuring 42 mm. Venous: The inferior vena cava is normal in size with greater than 50% respiratory variability, suggesting right atrial pressure of 3 mmHg. IAS/Shunts: No atrial level shunt detected by color flow Doppler.  LEFT VENTRICLE PLAX 2D LVIDd:         4.40 cm  Diastology LVIDs:         2.93 cm  LV e' medial:    5.98 cm/s LV PW:         1.14 cm  LV E/e' medial:  7.5 LV IVS:        0.90 cm  LV e' lateral:   8.92 cm/s LVOT diam:     2.20 cm  LV E/e' lateral: 5.0 LV SV:         104 LV SV Index:   48 LVOT Area:     3.80 cm  RIGHT VENTRICLE RV S prime:     12.80 cm/s TAPSE (M-mode): 2.9 cm LEFT ATRIUM             Index       RIGHT ATRIUM           Index LA diam:        3.20 cm 1.47 cm/m  RA Area:     17.70 cm LA Vol (A2C):   42.6 ml 19.63 ml/m RA Volume:   46.10 ml  21.24 ml/m LA Vol (A4C):   18.6 ml 8.57 ml/m LA Biplane Vol: 29.2 ml 13.46 ml/m  AORTIC VALVE AV Area (Vmax):    2.33 cm AV Area (Vmean):   2.60 cm AV Area (VTI):     2.83 cm AV Vmax:           196.33 cm/s AV Vmean:          125.333 cm/s AV VTI:            0.369 m AV Peak Grad:      15.4 mmHg AV Mean Grad:      7.3 mmHg LVOT Vmax:         120.33 cm/s LVOT Vmean:        85.800 cm/s LVOT VTI:          0.274 m LVOT/AV VTI ratio: 0.74  AORTA Ao Root diam: 3.80 cm MITRAL VALVE MV Area (PHT): 2.22 cm    SHUNTS MV Decel Time: 342 msec    Systemic VTI:  0.27 m MV E velocity: 44.60 cm/s   Systemic Diam: 2.20 cm MV A velocity: 54.80 cm/s MV E/A ratio:  0.81 [name] MD Electronically signed by [name] MD Signature Date/Time: 10/26/2020/12:33:59 PM    Final     Scheduled Meds: . albuterol  2 puff Inhalation Q6H  . vitamin C  500 mg Oral BID  . atorvastatin  20 mg Oral Daily  . dextromethorphan-guaiFENesin  1 tablet Oral BID  . diltiazem  120 mg Oral Daily  . enoxaparin (LOVENOX) injection  40 mg Subcutaneous Q24H  . insulin aspart  0-15 Units Subcutaneous TID WC  . insulin aspart  0-5 Units Subcutaneous QHS  . methylPREDNISolone (SOLU-MEDROL) injection  0.5 mg/kg Intravenous Q12H   Followed by  . [  START ON 10/29/2020] predniSONE  50 mg Oral Daily  . zinc sulfate  220 mg Oral Daily   Continuous Infusions: . remdesivir 100 mg in NS 100 mL 100 mg (10/27/20 1216)     LOS: 2 days   Time spent: 45 minutes  Lorella Nimrod, MD [facility]  If 7PM-7AM, please contact night-coverage [email or web address]  10/27/2020, 2:20 PM   This record has been created using Systems analyst. Errors have been sought and corrected,but may not always be located. Such creation errors do not reflect on the standard of care.

## 2020-10-27 NOTE — Progress Notes (Signed)
Spoke with MD on call, Dr. Cyd Silence, to let him know patient arrived from [facility].  Pt and vitals stable, no complaints at this time. Will continue to monitor.

## 2020-10-28 LAB — COMPREHENSIVE METABOLIC PANEL
ALT: 83 U/L — ABNORMAL HIGH (ref 0–44)
AST: 48 U/L — ABNORMAL HIGH (ref 15–41)
Albumin: 3.3 g/dL — ABNORMAL LOW (ref 3.5–5.0)
Alkaline Phosphatase: 48 U/L (ref 38–126)
Anion gap: 13 (ref 5–15)
BUN: 47 mg/dL — ABNORMAL HIGH (ref 8–23)
CO2: 27 mmol/L (ref 22–32)
Calcium: 9.1 mg/dL (ref 8.9–10.3)
Chloride: 101 mmol/L (ref 98–111)
Creatinine, Ser: 0.91 mg/dL (ref 0.61–1.24)
GFR, Estimated: >60 mL/min (ref >60)
Glucose, Bld: 191 mg/dL — ABNORMAL HIGH (ref 70–99)
Potassium: 4.5 mmol/L (ref 3.5–5.1)
Sodium: 141 mmol/L (ref 135–145)
Total Bilirubin: 1 mg/dL (ref 0.3–1.2)
Total Protein: 6.4 g/dL — ABNORMAL LOW (ref 6.5–8.1)

## 2020-10-28 LAB — CBC WITH DIFFERENTIAL/PLATELET
Abs Immature Granulocytes: 0.08 10*3/uL — ABNORMAL HIGH (ref 0.00–0.07)
Basophils Absolute: 0 10*3/uL (ref 0.0–0.1)
Basophils Relative: 0 %
Eosinophils Absolute: 0 10*3/uL (ref 0.0–0.5)
Eosinophils Relative: 0 %
HCT: 50 % (ref 39.0–52.0)
Hemoglobin: 16.8 g/dL (ref 13.0–17.0)
Immature Granulocytes: 1 %
Lymphocytes Relative: 6 %
Lymphs Abs: 0.8 10*3/uL (ref 0.7–4.0)
MCH: 30.7 pg (ref 26.0–34.0)
MCHC: 33.6 g/dL (ref 30.0–36.0)
MCV: 91.4 fL (ref 80.0–100.0)
Monocytes Absolute: 0.8 10*3/uL (ref 0.1–1.0)
Monocytes Relative: 6 %
Neutro Abs: 11.4 10*3/uL — ABNORMAL HIGH (ref 1.7–7.7)
Neutrophils Relative %: 87 %
Platelets: 243 10*3/uL (ref 150–400)
RBC: 5.47 MIL/uL (ref 4.22–5.81)
RDW: 12.9 % (ref 11.5–15.5)
WBC: 13.1 10*3/uL — ABNORMAL HIGH (ref 4.0–10.5)
nRBC: 0 % (ref 0.0–0.2)

## 2020-10-28 LAB — GLUCOSE, CAPILLARY
Glucose-Capillary: 221 mg/dL — ABNORMAL HIGH (ref 70–99)
Glucose-Capillary: 235 mg/dL — ABNORMAL HIGH (ref 70–99)
Glucose-Capillary: 201 mg/dL — ABNORMAL HIGH (ref 70–99)
Glucose-Capillary: 233 mg/dL — ABNORMAL HIGH (ref 70–99)

## 2020-10-28 LAB — C-REACTIVE PROTEIN: CRP: 2.9 mg/dL — ABNORMAL HIGH (ref <1.0)

## 2020-10-28 LAB — PHOSPHORUS: Phosphorus: 4 mg/dL (ref 2.5–4.6)

## 2020-10-28 LAB — D-DIMER, QUANTITATIVE: D-Dimer, Quant: 0.76 ug/mL-FEU — ABNORMAL HIGH (ref 0.00–0.50)

## 2020-10-28 LAB — PROCALCITONIN: Procalcitonin: <0.1 ng/mL

## 2020-10-28 LAB — MAGNESIUM: Magnesium: 2.3 mg/dL (ref 1.7–2.4)

## 2020-10-28 LAB — FERRITIN: Ferritin: 934 ng/mL — ABNORMAL HIGH (ref 24–336)

## 2020-10-28 MED ORDER — MUPIROCIN 2 % EX OINT
1.0000 "application " | TOPICAL_OINTMENT | Freq: Two times a day (BID) | CUTANEOUS | Status: AC
  Administered 2020-10-28 – 2020-11-01 (×10): 1 via NASAL
  Filled 2020-10-28: qty 22

## 2020-10-28 NOTE — Progress Notes (Signed)
PROGRESS NOTE    Lawrence [name]  BTD:[id number] DOB: 11-03-1951 DOA: 10/31/2020 PCP: [name], MD   Brief Narrative: Taken from prior notes. 69 y.o.malewith medical history significant foraortic insufficiency,  A. fib, h/o aortic aneurysm admitted on 10/22/2020 with COVID-19 infection/pneumonia with hypoxic respiratory failure and concerns about possible syncopal episode PTA Patient initially tested positive for COVID-19 on 10/15/2020 Patient is NOT vaccinated against COVID-19 Apparently transferred from [facility] to [facility], may be secondary to increased oxygen requirement. Patient continued to desaturate at 15 L-transferring to progressive care unit for heated high flow.  Subjective: Patient was feeling little better when seen today.  Again asking for taking a shower.  He was on nonrebreather. Asked nursing staff to see if they can help him with the shower while keeping him on 10 to 12 L of oxygen with nasal cannula.  Assessment & Plan:   Principal Problem:   Pneumonia due to COVID-19 virus Active Problems:   Atrial fibrillation, chronic (HCC)   Acute respiratory failure with hypoxia (HCC)   Hyperglycemia   Syncope and collapse  Acute hypoxic respiratory failure secondary to COVID-19 pneumonia. Patient with worsening hypoxia, some improvement in CRP, initial procalcitonin negative.  D-dimer at 0.76. -Transfer him to progressive care unit for heated high flow. -Continue with supplemental oxygen to keep the saturation above 88%. -Continue remdesivir-day 4 -Continue Solu-Medrol 60 mg twice daily. -Continue with supportive care. -Continue with supplements. -Encourage proning. -Incentive spirometry and flutter valve. -Continue with PPI.  Leukocytosis.  Patient is on steroid.  Procalcitonin remain negative.  Questionable syncopal episode.  EKG, echocardiogram and troponin all within normal limit.  D-dimer mildly elevated secondary to COVID-19 infection. -Continue to  moniter.  Hyperglycemia. A1c of 6.1,no prior diagnosis of diabetes. -SSI while patient is on steroids.  Chronic atrial fibrillation. Currently rate controlled, not on any chronic anticoagulation at home. -Continue home dose of metoprolol and Cardizem.  Hyperlipidemia. -Continue statin.  Objective: Vitals:   10/27/20 2207 10/27/20 2251 10/28/20 0125 10/28/20 0504  BP:   (!) 141/85 135/89  Pulse:   75 67  Resp:   (!) 22 20  Temp:   98.2 F (36.8 C) 97.8 F (36.6 C)  TempSrc:   Oral Oral  SpO2: (!) 72% (!) 84% 90% (!) 87%  Weight:      Height:        Intake/Output Summary (Last 24 hours) at 10/28/2020 0813 Last data filed at 10/27/2020 1300 Gross per 24 hour  Intake 240 ml  Output 700 ml  Net -460 ml   Filed Weights   10/29/2020 1322  Weight: 97.1 kg    Examination:  General.  Well-developed gentleman, in no acute distress. Pulmonary.  Few basal rhonchi bilaterally, normal respiratory effort. CV.  Regular rate and rhythm, no JVD, rub or murmur. Abdomen.  Soft, nontender, nondistended, BS positive. CNS.  Alert and oriented x3.  No focal neurologic deficit. Extremities.  No edema, no cyanosis, pulses intact and symmetrical. Psychiatry.  Judgment and insight appears normal.  DVT prophylaxis: Lovenox Code Status: Partial, DNI Family Communication: Discussed with patient Disposition Plan:  Status is: Inpatient  Remains inpatient appropriate because:Inpatient level of care appropriate due to severity of illness   Dispo: The patient is from: Home              Anticipated d/c is to: Home              Anticipated d/c date is: 2 days  Patient currently is not medically stable to d/c.  Consultants:   None  Procedures:  Antimicrobials:   Data Reviewed: I have personally reviewed following labs and imaging studies  CBC: Recent Labs  Lab 10/20/2020 1427 10/26/20 0342 10/27/20 0406 10/28/20 0412  WBC 8.0 6.1 9.4 13.1*  NEUTROABS  --  5.4 8.5* 11.4*   HGB 19.5* 17.0 16.8 16.8  HCT 57.8* 51.4 49.8 50.0  MCV 93.4 94.1 91.5 91.4  PLT 160 181 201 128   Basic Metabolic Panel: Recent Labs  Lab 10/26/2020 1427 10/26/20 0342 10/27/20 0822 10/28/20 0412  NA 135 136 136 141  K 4.2 3.8 4.5 4.5  CL 97* 102 101 101  CO2 24 23 22 27   GLUCOSE 219* 209* 245* 191*  BUN 39* 38* 47* 47*  CREATININE 1.22 0.95 0.99 0.91  CALCIUM 9.4 8.9 9.2 9.1  MG  --  2.5*  --  2.3  PHOS  --  3.2  --  4.0   GFR: Estimated Creatinine Clearance: 92.3 mL/min (by C-G formula based on SCr of 0.91 mg/dL). Liver Function Tests: Recent Labs  Lab 10/26/20 0342 10/27/20 0822 10/28/20 0412  AST 34 56* 48*  ALT 40 73* 83*  ALKPHOS 51 51 48  BILITOT 0.9 0.8 1.0  PROT 7.2 6.1* 6.4*  ALBUMIN 3.5 3.1* 3.3*   No results for input(s): LIPASE, AMYLASE in the last 168 hours. No results for input(s): AMMONIA in the last 168 hours. Coagulation Profile: No results for input(s): INR, PROTIME in the last 168 hours. Cardiac Enzymes: No results for input(s): CKTOTAL, CKMB, CKMBINDEX, TROPONINI in the last 168 hours. BNP (last 3 results) No results for input(s): PROBNP in the last 8760 hours. HbA1C: Recent Labs    10/23/2020 2130  HGBA1C 6.1*   CBG: Recent Labs  Lab 10/26/20 2217 10/27/20 0752 10/27/20 1059 10/27/20 1616 10/27/20 2151  GLUCAP 185* 220* 232* 226* 175*   Lipid Profile: Recent Labs    11/07/2020 2130  TRIG 60   Thyroid Function Tests: No results for input(s): TSH, T4TOTAL, FREET4, T3FREE, THYROIDAB in the last 72 hours. Anemia Panel: Recent Labs    10/26/20 0342 10/27/20 0822  FERRITIN 822* 1,106*   Sepsis Labs: Recent Labs  Lab 10/26/2020 2130 10/26/20 0014 10/27/20 0406  PROCALCITON <0.10  --  <0.10  LATICACIDVEN 1.6 1.1  --     Recent Results (from the past 240 hour(s))  Resp panel by RT-PCR (RSV, Flu A&B, Covid) Nasopharyngeal Swab     Status: Abnormal   Collection Time: 10/18/2020  7:40 PM   Specimen: Nasopharyngeal Swab;  Nasopharyngeal(NP) swabs in vial transport medium  Result Value Ref Range Status   SARS Coronavirus 2 by RT PCR POSITIVE (A) NEGATIVE Final    Comment: RESULT CALLED TO, READ BACK BY AND VERIFIED WITH: K BELTON,RN@2052  10/13/2020 MKELLY (NOTE) SARS-CoV-2 target nucleic acids are DETECTED.  The SARS-CoV-2 RNA is generally detectable in upper respiratory specimens during the acute phase of infection. Positive results are indicative of the presence of the identified virus, but do not rule out bacterial infection or co-infection with other pathogens not detected by the test. Clinical correlation with patient history and other diagnostic information is necessary to determine patient infection status. The expected result is Negative.  Fact Sheet for Patients: EntrepreneurPulse.com.au  Fact Sheet for Healthcare Providers: IncredibleEmployment.be  This test is not yet approved or cleared by the [place] FDA and  has been authorized for detection and/or diagnosis of SARS-CoV-2 by FDA under  an Emergency Use Authorization (EUA).  This EUA will remain in effect (meaning this test can be  used) for the duration of  the COVID-19 declaration under Section 564(b)(1) of the Act, 21 U.S.C. section 360bbb-3(b)(1), unless the authorization is terminated or revoked sooner.     Influenza A by PCR NEGATIVE NEGATIVE Final   Influenza B by PCR NEGATIVE NEGATIVE Final    Comment: (NOTE) The Xpert Xpress SARS-CoV-2/FLU/RSV plus assay is intended as an aid in the diagnosis of influenza from Nasopharyngeal swab specimens and should not be used as a sole basis for treatment. Nasal washings and aspirates are unacceptable for Xpert Xpress SARS-CoV-2/FLU/RSV testing.  Fact Sheet for Patients: EntrepreneurPulse.com.au  Fact Sheet for Healthcare Providers: IncredibleEmployment.be  This test is not yet approved or cleared by the  [place] FDA and has been authorized for detection and/or diagnosis of SARS-CoV-2 by FDA under an Emergency Use Authorization (EUA). This EUA will remain in effect (meaning this test can be used) for the duration of the COVID-19 declaration under Section 564(b)(1) of the Act, 21 U.S.C. section 360bbb-3(b)(1), unless the authorization is terminated or revoked.     Resp Syncytial Virus by PCR NEGATIVE NEGATIVE Final    Comment: (NOTE) Fact Sheet for Patients: EntrepreneurPulse.com.au  Fact Sheet for Healthcare Providers: IncredibleEmployment.be  This test is not yet approved or cleared by the [place] FDA and has been authorized for detection and/or diagnosis of SARS-CoV-2 by FDA under an Emergency Use Authorization (EUA). This EUA will remain in effect (meaning this test can be used) for the duration of the COVID-19 declaration under Section 564(b)(1) of the Act, 21 U.S.C. section 360bbb-3(b)(1), unless the authorization is terminated or revoked.  Performed at [facility], [address], [place], [place] [address]   Blood Culture (routine x 2)     Status: None (Preliminary result)   Collection Time: 11/04/2020  9:30 PM   Specimen: Right Antecubital; Blood  Result Value Ref Range Status   Specimen Description RIGHT ANTECUBITAL  Final   Special Requests   Final    BOTTLES DRAWN AEROBIC AND ANAEROBIC Blood Culture adequate volume   Culture   Final    NO GROWTH 3 DAYS Performed at [facility], [address], [place], [place] [address]    Report Status PENDING  Incomplete  Blood Culture (routine x 2)     Status: None (Preliminary result)   Collection Time: 11/04/2020 10:33 PM   Specimen: BLOOD LEFT HAND  Result Value Ref Range Status   Specimen Description BLOOD LEFT HAND  Final   Special Requests   Final    BOTTLES DRAWN AEROBIC AND ANAEROBIC Blood Culture adequate volume   Culture   Final    NO GROWTH 3 DAYS Performed at [facility], [address] [place], [place] [address]    Report Status PENDING  Incomplete     Radiology Studies: ECHOCARDIOGRAM COMPLETE  Result Date: 10/26/2020    ECHOCARDIOGRAM REPORT   Patient Name:   [name] Date of Exam: 10/26/2020 Medical Rec #:  [id number]       Height:       71.0 in Accession #:    [id number]      Weight:       214.0 lb Date of Birth:  1951/10/17        BSA:          2.170 m Patient Age:    22 years        BP:  169/94 mmHg Patient Gender: M               HR:           49 bpm. Exam Location:  [facility] Procedure: 2D Echo Indications:    Syncope 780.2 / R55  History:        Patient has prior history of Echocardiogram examinations, most                 recent 08/10/2018. Arrythmias:Atrial Fibrillation,                 Signs/Symptoms:Syncope; Risk Factors:Hypertension and Former                 Smoker. Pneumonia due to [place], Aortic root aneurysm.  Sonographer:    Leavy Cella RDCS (AE) Referring Phys: [id number] OLADAPO ADEFESO IMPRESSIONS  1. Left ventricular ejection fraction, by estimation, is 55 to 60%. The left ventricle has normal function. The left ventricle has no regional wall motion abnormalities. There is mild left ventricular hypertrophy. Left ventricular diastolic parameters are indeterminate.  2. Right ventricular systolic function is normal. The right ventricular size is normal. Tricuspid regurgitation signal is inadequate for assessing PA pressure.  3. The mitral valve is grossly normal. Trivial mitral valve regurgitation.  4. The aortic valve is bicuspid. There is mild calcification of the aortic valve. Aortic valve regurgitation is trivial. Mild to moderate aortic valve sclerosis/calcification is present, without any evidence of aortic stenosis. Aortic valve mean gradient measures 7.3 mmHg. Aortic valve Vmax measures 1.96 m/s.  5. 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 Aortic dilatation noted. There is mild dilatation of the aortic  root, measuring 42 mm based on various views.  6. The inferior vena cava is normal in size with greater than 50% respiratory variability, suggesting right atrial pressure of 3 mmHg. FINDINGS  Left Ventricle: Left ventricular ejection fraction, by estimation, is 55 to 60%. The left ventricle has normal function. The left ventricle has no regional wall motion abnormalities. The left ventricular internal cavity size was normal in size. There is  mild left ventricular hypertrophy. Left ventricular diastolic parameters are indeterminate. Right Ventricle: The right ventricular size is normal. No increase in right ventricular wall thickness. Right ventricular systolic function is normal. Tricuspid regurgitation signal is inadequate for assessing PA pressure. Left Atrium: Left atrial size was normal in size. Right Atrium: Right atrial size was normal in size. Pericardium: There is no evidence of pericardial effusion. Mitral Valve: The mitral valve is grossly normal. Trivial mitral valve regurgitation. Tricuspid Valve: The tricuspid valve is grossly normal. Tricuspid valve regurgitation is trivial. Aortic Valve: The aortic valve is bicuspid. There is mild calcification of the aortic valve. There is mild aortic valve annular calcification. Aortic valve regurgitation is trivial. Mild to moderate aortic valve sclerosis/calcification is present, without any evidence of aortic stenosis. Aortic valve mean gradient measures 7.3 mmHg. Aortic valve peak gradient measures 15.4 mmHg. Aortic valve area, by VTI measures 2.83 cm. Pulmonic Valve: The pulmonic valve was grossly normal. Pulmonic valve regurgitation is trivial. Aorta: Status supracoronary placement of 32 mm Hemashield graft. Aortic dilatation noted. There is mild dilatation of the aortic root, measuring 42 mm. Venous: The inferior vena cava is normal in size with greater than 50% respiratory variability, suggesting right atrial pressure of 3 mmHg. IAS/Shunts: No atrial level  shunt detected by color flow Doppler.  LEFT VENTRICLE PLAX 2D LVIDd:         4.40 cm  Diastology LVIDs:         2.93 cm  LV e' medial:    5.98 cm/s LV PW:         1.14 cm  LV E/e' medial:  7.5 LV IVS:        0.90 cm  LV e' lateral:   8.92 cm/s LVOT diam:     2.20 cm  LV E/e' lateral: 5.0 LV SV:         104 LV SV Index:   48 LVOT Area:     3.80 cm  RIGHT VENTRICLE RV S prime:     12.80 cm/s TAPSE (M-mode): 2.9 cm LEFT ATRIUM             Index       RIGHT ATRIUM           Index LA diam:        3.20 cm 1.47 cm/m  RA Area:     17.70 cm LA Vol (A2C):   42.6 ml 19.63 ml/m RA Volume:   46.10 ml  21.24 ml/m LA Vol (A4C):   18.6 ml 8.57 ml/m LA Biplane Vol: 29.2 ml 13.46 ml/m  AORTIC VALVE AV Area (Vmax):    2.33 cm AV Area (Vmean):   2.60 cm AV Area (VTI):     2.83 cm AV Vmax:           196.33 cm/s AV Vmean:          125.333 cm/s AV VTI:            0.369 m AV Peak Grad:      15.4 mmHg AV Mean Grad:      7.3 mmHg LVOT Vmax:         120.33 cm/s LVOT Vmean:        85.800 cm/s LVOT VTI:          0.274 m LVOT/AV VTI ratio: 0.74  AORTA Ao Root diam: 3.80 cm MITRAL VALVE MV Area (PHT): 2.22 cm    SHUNTS MV Decel Time: 342 msec    Systemic VTI:  0.27 m MV E velocity: 44.60 cm/s  Systemic Diam: 2.20 cm MV A velocity: 54.80 cm/s MV E/A ratio:  0.81 [name] MD Electronically signed by [name] MD Signature Date/Time: 10/26/2020/12:33:59 PM    Final     Scheduled Meds: . albuterol  2 puff Inhalation Q6H  . vitamin C  500 mg Oral BID  . atorvastatin  20 mg Oral Daily  . dextromethorphan-guaiFENesin  1 tablet Oral BID  . diltiazem  120 mg Oral Daily  . enoxaparin (LOVENOX) injection  40 mg Subcutaneous Q24H  . insulin aspart  0-15 Units Subcutaneous TID WC  . insulin aspart  0-5 Units Subcutaneous QHS  . methylPREDNISolone (SOLU-MEDROL) injection  60 mg Intravenous Q12H  . mupirocin ointment  1 application Nasal BID  . zinc sulfate  220 mg Oral Daily   Continuous Infusions: . remdesivir 100 mg  in NS 100 mL 100 mg (10/27/20 1216)     LOS: 3 days   Time spent: 35 minutes  Lorella Nimrod, MD [facility]  If 7PM-7AM, please contact night-coverage [email or web address]  10/28/2020, 8:13 AM   This record has been created using Systems analyst. Errors have been sought and corrected,but may not always be located. Such creation errors do not reflect on the standard of care.

## 2020-10-28 NOTE — Progress Notes (Signed)
   10/28/20 0125  Provider Notification  Provider Name/Title Amy RT  Date Provider Notified 10/28/20  Time Provider Notified 0129  Notification Type Call  Notification Reason Other (Comment) (decreased 02 sats 76)  Response Other (Comment) (at pt bedside)  Noted pt O2 sats to be 76%.  Went to pt room.  Pt had NRB off wiping his nose.  Encouraged to wipe nose quickly and to put mask back on d/t no respiratory reserve as evidenced by decreased saturations.  Amy at bedside currently with Heated HiFlo appartus.  Pt encouraged to turn on side since prone can not be tolerated by pt. 02 sats has increased to about 83-88%.  Now 93% currently.

## 2020-10-28 NOTE — Plan of Care (Signed)

## 2020-10-29 ENCOUNTER — Inpatient Hospital Stay (HOSPITAL_COMMUNITY): Payer: Medicare HMO

## 2020-10-29 LAB — COMPREHENSIVE METABOLIC PANEL
ALT: 89 U/L — ABNORMAL HIGH (ref 0–44)
AST: 46 U/L — ABNORMAL HIGH (ref 15–41)
Albumin: 3.2 g/dL — ABNORMAL LOW (ref 3.5–5.0)
Alkaline Phosphatase: 47 U/L (ref 38–126)
Anion gap: 11 (ref 5–15)
BUN: 48 mg/dL — ABNORMAL HIGH (ref 8–23)
CO2: 27 mmol/L (ref 22–32)
Calcium: 9.1 mg/dL (ref 8.9–10.3)
Chloride: 102 mmol/L (ref 98–111)
Creatinine, Ser: 1.01 mg/dL (ref 0.61–1.24)
GFR, Estimated: >60 mL/min (ref >60)
Glucose, Bld: 182 mg/dL — ABNORMAL HIGH (ref 70–99)
Potassium: 4.8 mmol/L (ref 3.5–5.1)
Sodium: 140 mmol/L (ref 135–145)
Total Bilirubin: 1 mg/dL (ref 0.3–1.2)
Total Protein: 6.4 g/dL — ABNORMAL LOW (ref 6.5–8.1)

## 2020-10-29 LAB — GLUCOSE, CAPILLARY
Glucose-Capillary: 220 mg/dL — ABNORMAL HIGH (ref 70–99)
Glucose-Capillary: 225 mg/dL — ABNORMAL HIGH (ref 70–99)
Glucose-Capillary: 242 mg/dL — ABNORMAL HIGH (ref 70–99)
Glucose-Capillary: 253 mg/dL — ABNORMAL HIGH (ref 70–99)

## 2020-10-29 LAB — PROCALCITONIN: Procalcitonin: <0.1 ng/mL

## 2020-10-29 LAB — CBC WITH DIFFERENTIAL/PLATELET
Abs Immature Granulocytes: 0.12 10*3/uL — ABNORMAL HIGH (ref 0.00–0.07)
Basophils Absolute: 0 10*3/uL (ref 0.0–0.1)
Basophils Relative: 0 %
Eosinophils Absolute: 0 10*3/uL (ref 0.0–0.5)
Eosinophils Relative: 0 %
HCT: 51.1 % (ref 39.0–52.0)
Hemoglobin: 17.2 g/dL — ABNORMAL HIGH (ref 13.0–17.0)
Immature Granulocytes: 1 %
Lymphocytes Relative: 4 %
Lymphs Abs: 0.5 10*3/uL — ABNORMAL LOW (ref 0.7–4.0)
MCH: 30.9 pg (ref 26.0–34.0)
MCHC: 33.7 g/dL (ref 30.0–36.0)
MCV: 91.9 fL (ref 80.0–100.0)
Monocytes Absolute: 0.7 10*3/uL (ref 0.1–1.0)
Monocytes Relative: 6 %
Neutro Abs: 9.9 10*3/uL — ABNORMAL HIGH (ref 1.7–7.7)
Neutrophils Relative %: 89 %
Platelets: 259 10*3/uL (ref 150–400)
RBC: 5.56 MIL/uL (ref 4.22–5.81)
RDW: 13.1 % (ref 11.5–15.5)
WBC: 11.2 10*3/uL — ABNORMAL HIGH (ref 4.0–10.5)
nRBC: 0 % (ref 0.0–0.2)

## 2020-10-29 LAB — PHOSPHORUS: Phosphorus: 3.7 mg/dL (ref 2.5–4.6)

## 2020-10-29 LAB — C-REACTIVE PROTEIN: CRP: 1.7 mg/dL — ABNORMAL HIGH (ref <1.0)

## 2020-10-29 LAB — FERRITIN: Ferritin: 882 ng/mL — ABNORMAL HIGH (ref 24–336)

## 2020-10-29 LAB — MAGNESIUM: Magnesium: 2.4 mg/dL (ref 1.7–2.4)

## 2020-10-29 LAB — D-DIMER, QUANTITATIVE: D-Dimer, Quant: 0.64 ug/mL-FEU — ABNORMAL HIGH (ref 0.00–0.50)

## 2020-10-29 MED ORDER — INSULIN GLARGINE 100 UNIT/ML ~~LOC~~ SOLN
5.0000 [IU] | Freq: Two times a day (BID) | SUBCUTANEOUS | Status: DC
  Administered 2020-10-29 – 2020-11-06 (×17): 5 [IU] via SUBCUTANEOUS
  Filled 2020-10-29 (×18): qty 0.05

## 2020-10-29 MED ORDER — SALINE SPRAY 0.65 % NA SOLN
1.0000 | NASAL | Status: DC | PRN
  Administered 2020-10-29 – 2020-11-18 (×5): 1 via NASAL
  Filled 2020-10-29: qty 44

## 2020-10-29 MED ORDER — VITAMIN D 25 MCG (1000 UNIT) PO TABS
1000.0000 [IU] | ORAL_TABLET | Freq: Every day | ORAL | Status: DC
  Administered 2020-10-29 – 2020-11-02 (×5): 1000 [IU] via ORAL
  Filled 2020-10-29 (×6): qty 1

## 2020-10-29 NOTE — Plan of Care (Signed)

## 2020-10-29 NOTE — Progress Notes (Signed)
PROGRESS NOTE  BRAIN HONEYCUTT BTD:[id number] DOB: Jan 09, 1951 DOA: 10/13/2020 PCP: [name], MD  HPI/Recap of past 24 hours:  Taken from prior notes. 69 y.o.malewith medical history significant foraortic insufficiency, A. fib,h/oaortic aneurysmadmitted on 11/03/2020 with COVID-19 infection/pneumonia with hypoxic respiratory failureandconcerns about possible syncopal episode PTA Patient initially tested positive for COVID-19 on 10/15/2020 Patient isNOTvaccinated against COVID-19 Apparently transferred from [facility] to [facility], may be secondary to increased oxygen requirement. Patient continued to desaturate at 15 L-transferring to progressive care unit for heated high flow.  10/29/20: Seen and examined at his bedside.  Conversational dyspnea noted.  He denies any chest pain.  Declined baricitinib adamantly, also declined a call to his family members for updates.  Patient is alert and oriented x3.  Repeated his chest x-ray, personally reviewed, showing worsening infiltrates bilaterally.  Patient still adamant about not using baricitinib, did not start due to patient's refusal.  Assessment/Plan: Principal Problem:   Pneumonia due to COVID-19 virus Active Problems:   Atrial fibrillation, chronic (HCC)   Acute respiratory failure with hypoxia (HCC)   Hyperglycemia   Syncope and collapse  Acute hypoxic respiratory failure secondary to COVID-19 viral pneumonia, POA. Worsening hypoxia, now on high flow nasal cannula 12 L with O2 saturation of 84%. Adamantly declines baricitinib.  Also declines a call to his family for updates.  He is alert and oriented x3. Completed 5 days of remdesivir on 10/29/2020. Currently on IV Solu-Medrol twice daily Continue vitamin C, D3 and zinc. Continue bronchodilators, flutter valve and incentive spirometer. Maintain O2 saturation greater than 92%.  Leukocytosis likely reactive in the setting of IV steroid use.  Procalcitonin negative no  evidence of bacterial infection. WBC is downtrending. Inflammatory markers are also downtrending.  Questionable syncopal episode.  EKG, echocardiogram and troponin all within normal limit.  D-dimer mildly elevated secondary to COVID-19 infection. -Continue to moniter.  Hyperglycemia likely secondary to high doses of IV steroids. Hemoglobin A1c 6.1 on 10/23/2020.   Continue insulin coverage.  Chronic atrial fibrillation.  Currently rate controlled on oral diltiazem.   Not on oral anticoagulation at home.  Continue to monitor on telemetry.  Hyperlipidemia. -Continue statin.   Code Status: DNI, CPR ok, no intubation.  Family Communication: Patient declines call to his family, he is alert and oriented x3.  Disposition Plan: Likely will DC to home once his O2 requirement is less than 5 L nasal cannula.   Consultants:  None.  Procedures:  None.  Antimicrobials:  None.  DVT prophylaxis: Subcu Lovenox daily  Status is: Inpatient    Dispo:  Patient From: Home  Planned Disposition: Home  Expected discharge date: 10/30/2020  Medically stable for discharge: No, ongoing management of acute hypoxic respiratory failure secondary to COVID-19 viral pneumonia.        Objective: Vitals:   10/28/20 2200 10/29/20 0505 10/29/20 0950 10/29/20 1327  BP:  (!) 141/85 (!) 145/84 (!) 142/92  Pulse:  63 67 65  Resp:  20 18   Temp:  98 F (36.7 C) (!) 97 F (36.1 C) 97.7 F (36.5 C)  TempSrc:  Oral Oral Axillary  SpO2: 90% (!) 85% (!) 86% (!) 84%  Weight:      Height:        Intake/Output Summary (Last 24 hours) at 10/29/2020 1348 Last data filed at 10/29/2020 0500 Gross per 24 hour  Intake 0 ml  Output 275 ml  Net -275 ml   Filed Weights   10/18/2020 1322  Weight:  97.1 kg    Exam:  . General: 69 y.o. year-old male well developed well nourished in no acute distress.  Alert and oriented x3. . Cardiovascular: Regular rate and rhythm with no rubs or gallops.   No thyromegaly or JVD noted.   Marland Kitchen Respiratory: Clear to auscultation with no wheezes or rales. Good inspiratory effort. . Abdomen: Soft nontender nondistended with normal bowel sounds x4 quadrants. . Musculoskeletal: No lower extremity edema. 2/4 pulses in all 4 extremities. . Skin: No ulcerative lesions noted or rashes, . Psychiatry: Mood is appropriate for condition and setting   Data Reviewed: CBC: Recent Labs  Lab 10/26/2020 1427 10/26/20 0342 10/27/20 0406 10/28/20 0412 10/29/20 0350  WBC 8.0 6.1 9.4 13.1* 11.2*  NEUTROABS  --  5.4 8.5* 11.4* 9.9*  HGB 19.5* 17.0 16.8 16.8 17.2*  HCT 57.8* 51.4 49.8 50.0 51.1  MCV 93.4 94.1 91.5 91.4 91.9  PLT 160 181 201 243 509   Basic Metabolic Panel: Recent Labs  Lab 11/09/2020 1427 10/26/20 0342 10/27/20 0822 10/28/20 0412 10/29/20 0350  NA 135 136 136 141 140  K 4.2 3.8 4.5 4.5 4.8  CL 97* 102 101 101 102  CO2 24 23 22 27 27   GLUCOSE 219* 209* 245* 191* 182*  BUN 39* 38* 47* 47* 48*  CREATININE 1.22 0.95 0.99 0.91 1.01  CALCIUM 9.4 8.9 9.2 9.1 9.1  MG  --  2.5*  --  2.3 2.4  PHOS  --  3.2  --  4.0 3.7   GFR: Estimated Creatinine Clearance: 83.2 mL/min (by C-G formula based on SCr of 1.01 mg/dL). Liver Function Tests: Recent Labs  Lab 10/26/20 0342 10/27/20 0822 10/28/20 0412 10/29/20 0350  AST 34 56* 48* 46*  ALT 40 73* 83* 89*  ALKPHOS 51 51 48 47  BILITOT 0.9 0.8 1.0 1.0  PROT 7.2 6.1* 6.4* 6.4*  ALBUMIN 3.5 3.1* 3.3* 3.2*   No results for input(s): LIPASE, AMYLASE in the last 168 hours. No results for input(s): AMMONIA in the last 168 hours. Coagulation Profile: No results for input(s): INR, PROTIME in the last 168 hours. Cardiac Enzymes: No results for input(s): CKTOTAL, CKMB, CKMBINDEX, TROPONINI in the last 168 hours. BNP (last 3 results) No results for input(s): PROBNP in the last 8760 hours. HbA1C: No results for input(s): HGBA1C in the last 72 hours. CBG: Recent Labs  Lab 10/28/20 1258  10/28/20 1741 10/28/20 2135 10/29/20 1017 10/29/20 1330  GLUCAP 235* 201* 233* 220* 225*   Lipid Profile: No results for input(s): CHOL, HDL, LDLCALC, TRIG, CHOLHDL, LDLDIRECT in the last 72 hours. Thyroid Function Tests: No results for input(s): TSH, T4TOTAL, FREET4, T3FREE, THYROIDAB in the last 72 hours. Anemia Panel: Recent Labs    10/28/20 0412 10/29/20 0350  FERRITIN 934* 882*   Urine analysis:    Component Value Date/Time   COLORURINE YELLOW 10/17/2020 1900   APPEARANCEUR HAZY (A) 10/16/2020 1900   LABSPEC 1.032 (H) 11/02/2020 1900   PHURINE 5.0 11/06/2020 1900   GLUCOSEU 50 (A) 10/18/2020 1900   HGBUR NEGATIVE 10/30/2020 1900   BILIRUBINUR NEGATIVE 10/24/2020 1900   KETONESUR 20 (A) 11/07/2020 1900   PROTEINUR 100 (A) 11/08/2020 1900   UROBILINOGEN 0.2 06/17/2013 1012   NITRITE NEGATIVE 11/01/2020 1900   LEUKOCYTESUR NEGATIVE 10/16/2020 1900   Sepsis Labs: @LABRCNTIP (procalcitonin:4,lacticidven:4)  ) Recent Results (from the past 240 hour(s))  Resp panel by RT-PCR (RSV, Flu A&B, Covid) Nasopharyngeal Swab     Status: Abnormal   Collection Time: 10/12/2020  7:40 PM   Specimen: Nasopharyngeal Swab; Nasopharyngeal(NP) swabs in vial transport medium  Result Value Ref Range Status   SARS Coronavirus 2 by RT PCR POSITIVE (A) NEGATIVE Final    Comment: RESULT CALLED TO, READ BACK BY AND VERIFIED WITH: K BELTON,RN@2052  10/22/2020 MKELLY (NOTE) SARS-CoV-2 target nucleic acids are DETECTED.  The SARS-CoV-2 RNA is generally detectable in upper respiratory specimens during the acute phase of infection. Positive results are indicative of the presence of the identified virus, but do not rule out bacterial infection or co-infection with other pathogens not detected by the test. Clinical correlation with patient history and other diagnostic information is necessary to determine patient infection status. The expected result is Negative.  Fact Sheet for  Patients: EntrepreneurPulse.com.au  Fact Sheet for Healthcare Providers: IncredibleEmployment.be  This test is not yet approved or cleared by the [place] FDA and  has been authorized for detection and/or diagnosis of SARS-CoV-2 by FDA under an Emergency Use Authorization (EUA).  This EUA will remain in effect (meaning this test can be  used) for the duration of  the COVID-19 declaration under Section 564(b)(1) of the Act, 21 U.S.C. section 360bbb-3(b)(1), unless the authorization is terminated or revoked sooner.     Influenza A by PCR NEGATIVE NEGATIVE Final   Influenza B by PCR NEGATIVE NEGATIVE Final    Comment: (NOTE) The Xpert Xpress SARS-CoV-2/FLU/RSV plus assay is intended as an aid in the diagnosis of influenza from Nasopharyngeal swab specimens and should not be used as a sole basis for treatment. Nasal washings and aspirates are unacceptable for Xpert Xpress SARS-CoV-2/FLU/RSV testing.  Fact Sheet for Patients: EntrepreneurPulse.com.au  Fact Sheet for Healthcare Providers: IncredibleEmployment.be  This test is not yet approved or cleared by the [place] FDA and has been authorized for detection and/or diagnosis of SARS-CoV-2 by FDA under an Emergency Use Authorization (EUA). This EUA will remain in effect (meaning this test can be used) for the duration of the COVID-19 declaration under Section 564(b)(1) of the Act, 21 U.S.C. section 360bbb-3(b)(1), unless the authorization is terminated or revoked.     Resp Syncytial Virus by PCR NEGATIVE NEGATIVE Final    Comment: (NOTE) Fact Sheet for Patients: EntrepreneurPulse.com.au  Fact Sheet for Healthcare Providers: IncredibleEmployment.be  This test is not yet approved or cleared by the [place] FDA and has been authorized for detection and/or diagnosis of SARS-CoV-2 by FDA under an Emergency Use  Authorization (EUA). This EUA will remain in effect (meaning this test can be used) for the duration of the COVID-19 declaration under Section 564(b)(1) of the Act, 21 U.S.C. section 360bbb-3(b)(1), unless the authorization is terminated or revoked.  Performed at [facility], [address] [place], [place] [address]   Blood Culture (routine x 2)     Status: None (Preliminary result)   Collection Time: 11/05/2020  9:30 PM   Specimen: Right Antecubital; Blood  Result Value Ref Range Status   Specimen Description RIGHT ANTECUBITAL  Final   Special Requests   Final    BOTTLES DRAWN AEROBIC AND ANAEROBIC Blood Culture adequate volume   Culture   Final    NO GROWTH 3 DAYS Performed at [facility], [address] [place], [place] [address]    Report Status PENDING  Incomplete  Blood Culture (routine x 2)     Status: None (Preliminary result)   Collection Time: 10/12/2020 10:33 PM   Specimen: BLOOD LEFT HAND  Result Value Ref Range Status   Specimen Description BLOOD LEFT HAND  Final   Special Requests   Final    BOTTLES DRAWN AEROBIC AND ANAEROBIC Blood Culture adequate volume   Culture   Final    NO GROWTH 3 DAYS Performed at [facility], [address], [place], [place] [address]    Report Status PENDING  Incomplete      Studies: No results found.  Scheduled Meds: . albuterol  2 puff Inhalation Q6H  . vitamin C  500 mg Oral BID  . atorvastatin  20 mg Oral Daily  . cholecalciferol  1,000 Units Oral Daily  . dextromethorphan-guaiFENesin  1 tablet Oral BID  . diltiazem  120 mg Oral Daily  . enoxaparin (LOVENOX) injection  40 mg Subcutaneous Q24H  . insulin aspart  0-15 Units Subcutaneous TID WC  . insulin aspart  0-5 Units Subcutaneous QHS  . insulin glargine  5 Units Subcutaneous BID  . methylPREDNISolone (SOLU-MEDROL) injection  60 mg Intravenous Q12H  . mupirocin ointment  1 application Nasal BID  . zinc sulfate  220 mg Oral Daily    Continuous Infusions:   LOS: 4  days     Kayleen Memos, MD [facility] Pager [phone]  If 7PM-7AM, please contact night-coverage [email or web address] Password [email or web address] 10/29/2020, 1:48 PM

## 2020-10-30 ENCOUNTER — Inpatient Hospital Stay (HOSPITAL_COMMUNITY): Payer: Medicare HMO

## 2020-10-30 DIAGNOSIS — J9601 Acute respiratory failure with hypoxia: Secondary | ICD-10-CM

## 2020-10-30 DIAGNOSIS — U071 COVID-19: Principal | ICD-10-CM

## 2020-10-30 DIAGNOSIS — J1282 Pneumonia due to coronavirus disease 2019: Secondary | ICD-10-CM

## 2020-10-30 LAB — D-DIMER, QUANTITATIVE: D-Dimer, Quant: 0.92 ug/mL-FEU — ABNORMAL HIGH (ref 0.00–0.50)

## 2020-10-30 LAB — CULTURE, BLOOD (ROUTINE X 2)
Culture: NO GROWTH
Culture: NO GROWTH
Special Requests: ADEQUATE
Special Requests: ADEQUATE

## 2020-10-30 LAB — MAGNESIUM: Magnesium: 2.3 mg/dL (ref 1.7–2.4)

## 2020-10-30 LAB — CBC WITH DIFFERENTIAL/PLATELET
Abs Immature Granulocytes: 0.15 10*3/uL — ABNORMAL HIGH (ref 0.00–0.07)
Basophils Absolute: 0.1 10*3/uL (ref 0.0–0.1)
Basophils Relative: 0 %
Eosinophils Absolute: 0 10*3/uL (ref 0.0–0.5)
Eosinophils Relative: 0 %
HCT: 53.6 % — ABNORMAL HIGH (ref 39.0–52.0)
Hemoglobin: 18 g/dL — ABNORMAL HIGH (ref 13.0–17.0)
Immature Granulocytes: 1 %
Lymphocytes Relative: 5 %
Lymphs Abs: 0.6 10*3/uL — ABNORMAL LOW (ref 0.7–4.0)
MCH: 30.9 pg (ref 26.0–34.0)
MCHC: 33.6 g/dL (ref 30.0–36.0)
MCV: 92.1 fL (ref 80.0–100.0)
Monocytes Absolute: 0.7 10*3/uL (ref 0.1–1.0)
Monocytes Relative: 6 %
Neutro Abs: 10.4 10*3/uL — ABNORMAL HIGH (ref 1.7–7.7)
Neutrophils Relative %: 88 %
Platelets: 290 10*3/uL (ref 150–400)
RBC: 5.82 MIL/uL — ABNORMAL HIGH (ref 4.22–5.81)
RDW: 13 % (ref 11.5–15.5)
WBC: 11.8 10*3/uL — ABNORMAL HIGH (ref 4.0–10.5)
nRBC: 0 % (ref 0.0–0.2)

## 2020-10-30 LAB — COMPREHENSIVE METABOLIC PANEL
ALT: 77 U/L — ABNORMAL HIGH (ref 0–44)
AST: 32 U/L (ref 15–41)
Albumin: 3.1 g/dL — ABNORMAL LOW (ref 3.5–5.0)
Alkaline Phosphatase: 51 U/L (ref 38–126)
Anion gap: 11 (ref 5–15)
BUN: 49 mg/dL — ABNORMAL HIGH (ref 8–23)
CO2: 26 mmol/L (ref 22–32)
Calcium: 8.9 mg/dL (ref 8.9–10.3)
Chloride: 105 mmol/L (ref 98–111)
Creatinine, Ser: 0.98 mg/dL (ref 0.61–1.24)
GFR, Estimated: >60 mL/min (ref >60)
Glucose, Bld: 187 mg/dL — ABNORMAL HIGH (ref 70–99)
Potassium: 4.7 mmol/L (ref 3.5–5.1)
Sodium: 142 mmol/L (ref 135–145)
Total Bilirubin: 1.2 mg/dL (ref 0.3–1.2)
Total Protein: 6.1 g/dL — ABNORMAL LOW (ref 6.5–8.1)

## 2020-10-30 LAB — PHOSPHORUS: Phosphorus: 3.7 mg/dL (ref 2.5–4.6)

## 2020-10-30 LAB — FERRITIN: Ferritin: 831 ng/mL — ABNORMAL HIGH (ref 24–336)

## 2020-10-30 LAB — GLUCOSE, CAPILLARY
Glucose-Capillary: 187 mg/dL — ABNORMAL HIGH (ref 70–99)
Glucose-Capillary: 338 mg/dL — ABNORMAL HIGH (ref 70–99)
Glucose-Capillary: 163 mg/dL — ABNORMAL HIGH (ref 70–99)
Glucose-Capillary: 261 mg/dL — ABNORMAL HIGH (ref 70–99)

## 2020-10-30 LAB — C-REACTIVE PROTEIN: CRP: 0.9 mg/dL (ref <1.0)

## 2020-10-30 MED ORDER — HEPARIN (PORCINE) 25000 UT/250ML-% IV SOLN
1500.0000 [IU]/h | INTRAVENOUS | Status: DC
  Administered 2020-10-30: 18:00:00 1500 [IU]/h via INTRAVENOUS
  Filled 2020-10-30: qty 250

## 2020-10-30 MED ORDER — HEPARIN BOLUS VIA INFUSION
3000.0000 [IU] | Freq: Once | INTRAVENOUS | Status: AC
  Administered 2020-10-30: 18:00:00 3000 [IU] via INTRAVENOUS
  Filled 2020-10-30: qty 3000

## 2020-10-30 MED ORDER — PANTOPRAZOLE SODIUM 40 MG PO TBEC
40.0000 mg | DELAYED_RELEASE_TABLET | Freq: Two times a day (BID) | ORAL | Status: DC
  Administered 2020-10-31 – 2020-11-19 (×39): 40 mg via ORAL
  Filled 2020-10-30 (×39): qty 1

## 2020-10-30 MED ORDER — FUROSEMIDE 10 MG/ML IJ SOLN
20.0000 mg | Freq: Once | INTRAMUSCULAR | Status: AC
  Administered 2020-10-30: 09:00:00 20 mg via INTRAVENOUS
  Filled 2020-10-30: qty 2

## 2020-10-30 NOTE — Progress Notes (Signed)
RN ASKED RT TO LOOK AT THE PT DUE TO HIS HIGH OXYGEN NEEDS. PT IS ON 100% AND 54F. AND A PNB. THE PT WANTS TO LAT DOWN AND RT EXPLAINED TO THE PT THAT HE COULDN'T LAY FLAT THAT IT WOULD EFFECT HIS BREATHING. RT GAVE THE NURSE A NRB TO PUT ON THE PT IN PLACE OF THE PNB AND SAID THAT HE NEEDED TO BE PULLED UP IN THE BED. RT WILL CONTINUE TO MONITOR

## 2020-10-30 NOTE — Progress Notes (Signed)
RT called to room for patient in low to mid 80s on 15L salter and 15L NRB. RT placed patient on heated HFNC on 45L 100% with NRB. Patient tolerating well.

## 2020-10-30 NOTE — Consult Note (Addendum)
NAME:  Lawrence [name], MRN:  [id number], DOB:  24-Feb-1951, LOS: 5 ADMISSION DATE:  10/12/2020, CONSULTATION DATE:  12/20 REFERRING MD:  [facility]/ [facility] CHIEF COMPLAINT:  Worsening sats in covid pna   Brief History:  60 yowm unvaccinated "I'll never take the shot"  with remote smoking hx/ aortic valve dz admitted 12/15 with about 10 days  Of a flu like syndrome and had reportedly tested positive for covid 12/5  > admit with covid 19/ acute resp failure but has been refusing recommendations by [facility] including use of Baricitinb   History of Present Illness:  69 y.o. male with medical history significant for aortic insufficiency, aortic stenosis,  A. fib, aortic aneurysm presentedto the emergency department due to 2-day onset of shortness of breath.  Patient complained of more than 1 week onset of upper respiratory tract infection symptoms including cough, sore throat, nasal congestion, fever and chills and an episode of nausea and nonbloody vomiting.  These symptoms resolved after about a week, but returned after a few days with same symptoms including productive cough, nasal congestion and general body aches.  Patient states that he had been lying on the couch for about 2 days without being aware of people knocking on his door, neither did he hear his phone ring.  Apparently, friends and family have been trying to reach him during this period, so he thought that he must have been in a "coma".  He notified his sister in [place] and she activated EMS and patient was taken to the ED for further evaluation and management. He states that he tested positive for Covid on 12/5.   ED Course:  In the emergency department, he was tachypneic, tachycardic and hypoxic with an O2 sat of 88% on room air, supplemental oxygen at 4 PM was provided with improvement in O2sat to low 90s.  Work-up in the ED showed H/H 19.5/57.8, hyperglycemia, urinalysis which was unimpressive for UTI.  Ferritin 940, CRP 16.2, fibrinogen  620, D-dimer 0.92.  SARS coronavirus 2 was positive.  Chest x-ray showed patchy opacity at the lung bases and right midlung suspicious for atelectasis versus pneumonia. He was treated with IV antibiotics (ceftriaxone and azithromycin).  IV NS 500 mL was given. Hospitalist was asked to admit patient for further evaluation and management.  Past Medical History:  Aortic insufficiency, Aortic stenosis Arthritis, Bicuspid aortic valve Essential hypertension, GERD (gastroesophageal reflux disease),  Gout, Postoperative atrial fibrillation ([place]) Thoracic aortic aneurysm ([place]).    Significant Hospital Events:     Consults:  PCCM  12/20  Procedures:    Significant Diagnostic Tests:  Echo 12/16  Ok with estimated RAP  3 mmHg Venous Dopplers 12/20   Micro Data:  BC x 2  12/15  Neg Resp viral panel PCR  12/15  Flu neg/  COVID POS  Antimicrobials:  Zmax 12/15 only Rocephin 12/15 only  Remdesovir 12/15 - 12/19    Scheduled Meds: . albuterol  2 puff Inhalation Q6H  . vitamin C  500 mg Oral BID  . atorvastatin  20 mg Oral Daily  . cholecalciferol  1,000 Units Oral Daily  . dextromethorphan-guaiFENesin  1 tablet Oral BID  . diltiazem  120 mg Oral Daily  . enoxaparin (LOVENOX) injection  40 mg Subcutaneous Q24H  . insulin aspart  0-15 Units Subcutaneous TID WC  . insulin aspart  0-5 Units Subcutaneous QHS  . insulin glargine  5 Units Subcutaneous BID  . methylPREDNISolone (SOLU-MEDROL) injection  60 mg Intravenous Q12H  .  mupirocin ointment  1 application Nasal BID  . zinc sulfate  220 mg Oral Daily   Continuous Infusions: PRN Meds:.acetaminophen, guaiFENesin-dextromethorphan, ondansetron **OR** ondansetron (ZOFRAN) IV, sodium chloride     Interim History / Subjective:  Denies cp or sob, min mucoid sputum/ refuses even venous blood work unless a butter fly is used, refuses transfer to ICU until he gets his sponge bath and supper   Objective   Blood pressure (!) 139/100,  pulse (!) 58, temperature 97.6 F (36.4 C), temperature source Axillary, resp. rate (!) 23, height 5\' 11"  (1.803 m), weight 97.1 kg, SpO2 (!) 89 %.    FiO2 (%):  [100 %] 100 %   Intake/Output Summary (Last 24 hours) at 10/30/2020 1626 Last data filed at 10/30/2020 1415 Gross per 24 hour  Intake 580 ml  Output 1800 ml  Net -1220 ml   Filed Weights   10/21/2020 1322  Weight: 97.1 kg    Examination: Tmax 97.9  - pulse oximeter reading is erratic tracing on "100% 02 taking mask off, NP really not staying in nose either General: crusty and somewhat cantankerous wm nad HENT: orphx clear Lungs: a few insp pops/ squeaks/ no wheeze  Cardiovascular: RRR no s3  Abdomen: soft/ benign Extremities: no calf tenderness/ edema/ clubbing  Neuro: very sharp mentation /no deficits     I personally reviewed images and agree with radiology impression as follows:  CXR:   10/30/20 Slight improvement in bilateral airspace opacities.   Resolved Hospital Problem list      Assessment & Plan:   1)  Covid 19 pneumonia complicated by hypoxemic resp failure  - s/p full course remdisovir and on approp steroid rx - refused Baricitinib / refused further blood work or intubation and he appears to be of sound mind and also refuses ET  - willing to consider bipap but I doubt he'll cooperate with that either   So  Cancel ICU bed/ can try some bipap on floor prn / remains DNI/ nothing else to offer here which probably won't be needed anyway now 15 days out from onset with improving cxr but pt appears to be  Making sound decision to refuse care "if I don't make it, so be it"    2)  Mild polycythemia ? Etiology should be protective in this setting.   3) Elevated d dimer:   while  high normal value (seen commonly in the elderly or chronically ill and virtually every covid pt both acutely and post acute illness)   may miss small peripheral pe, the clot burden with sob is moderately high and the d dimer  has a  very high neg pred value if used in this setting.   >>> if venous dopplers neg I would change back to lovenox prophylactic doses      Best practice (evaluated daily)  Diet: per [facility] Pain/Anxiety/Delirium protocol (if indicated): per [facility] VAP protocol (if indicated): DVT prophylaxis: IV hep until venous dopplers neg  GI prophylaxis:  PPI Glucose control: per [facility]  Mobility: up as tol/ prone if possible while in bed  Disposition:ICU      Labs   CBC: Recent Labs  Lab 10/26/20 0342 10/27/20 0406 10/28/20 0412 10/29/20 0350 10/30/20 0428  WBC 6.1 9.4 13.1* 11.2* 11.8*  NEUTROABS 5.4 8.5* 11.4* 9.9* 10.4*  HGB 17.0 16.8 16.8 17.2* 18.0*  HCT 51.4 49.8 50.0 51.1 53.6*  MCV 94.1 91.5 91.4 91.9 92.1  PLT 181 201 243 259 290    Basic  Metabolic Panel: Recent Labs  Lab 10/26/20 0342 10/27/20 0406 10/27/20 0822 10/28/20 0412 10/29/20 0350 10/30/20 0428  NA 136 137 136 141 140 142  K 3.8 4.2 4.5 4.5 4.8 4.7  CL 102 102 101 101 102 105  CO2 23 21* 22 27 27 26   GLUCOSE 209* 277* 245* 191* 182* 187*  BUN 38* 48* 47* 47* 48* 49*  CREATININE 0.95 1.04 0.99 0.91 1.01 0.98  CALCIUM 8.9 9.0 9.2 9.1 9.1 8.9  MG 2.5* 2.4  --  2.3 2.4 2.3  PHOS 3.2 3.6  --  4.0 3.7 3.7   GFR: Estimated Creatinine Clearance: 85.7 mL/min (by C-G formula based on SCr of 0.98 mg/dL). Recent Labs  Lab 10/29/2020 2130 10/26/20 0014 10/26/20 0342 10/27/20 0406 10/28/20 0412 10/29/20 0350 10/30/20 0428  PROCALCITON <0.10  --   --  <0.10 <0.10 <0.10  --   WBC  --   --    < > 9.4 13.1* 11.2* 11.8*  LATICACIDVEN 1.6 1.1  --   --   --   --   --    < > = values in this interval not displayed.    Liver Function Tests: Recent Labs  Lab 10/27/20 0406 10/27/20 0822 10/28/20 0412 10/29/20 0350 10/30/20 0428  AST 51* 56* 48* 46* 32  ALT 64* 73* 83* 89* 77*  ALKPHOS 46 51 48 47 51  BILITOT 0.7 0.8 1.0 1.0 1.2  PROT 6.3* 6.1* 6.4* 6.4* 6.1*  ALBUMIN 3.3* 3.1* 3.3* 3.2* 3.1*   No results for  input(s): LIPASE, AMYLASE in the last 168 hours. No results for input(s): AMMONIA in the last 168 hours.  ABG    Component Value Date/Time   PHART 7.339 (L) 07/21/2018 0000   PCO2ART 39.6 07/21/2018 0000   PO2ART 94.0 07/21/2018 0000   HCO3 21.3 07/21/2018 0000   TCO2 23 07/21/2018 1555   ACIDBASEDEF 4.0 (H) 07/21/2018 0000   O2SAT 97.0 07/21/2018 0000     Coagulation Profile: No results for input(s): INR, PROTIME in the last 168 hours.  Cardiac Enzymes: No results for input(s): CKTOTAL, CKMB, CKMBINDEX, TROPONINI in the last 168 hours.  HbA1C: Hgb A1c MFr Bld  Date/Time Value Ref Range Status  10/27/2020 09:30 PM 6.1 (H) 4.8 - 5.6 % Final    Comment:    (NOTE) Pre diabetes:          5.7%-6.4%  Diabetes:              >6.4%  Glycemic control for   <7.0% adults with diabetes   07/16/2018 04:08 PM 6.0 (H) 4.8 - 5.6 % Final    Comment:    (NOTE) Pre diabetes:          5.7%-6.4% Diabetes:              >6.4% Glycemic control for   <7.0% adults with diabetes     CBG: Recent Labs  Lab 10/29/20 1709 10/29/20 2022 10/30/20 0803 10/30/20 1202 10/30/20 1602  GLUCAP 242* 253* 187* 338* 163*       Surgical History:   Past Surgical History:  Procedure Laterality Date  . ASCENDING AORTIC ROOT REPLACEMENT N/A 07/20/2018   Procedure: REPLACMENT OF ASCENDING AORTA WITH 32MM GRAFT. HYPOTHERMIC CIRCULATORY ARREST. WHEAT PROCEDURE.;  Surgeon: [name], MD;  Location: [facility];  Service: Open Heart Surgery;  Laterality: N/A;  . COLONOSCOPY N/A 08/29/2016   Procedure: COLONOSCOPY;  Surgeon: Danie Binder, MD;  [facility];  Service:  Endoscopy;  Laterality: N/A;  1030  . COLONOSCOPY N/A 03/09/2018   Procedure: COLONOSCOPY;  Surgeon: Danie Binder, MD;  [facility];  Service: Endoscopy;  Laterality: N/A;  1:15pm  . HERNIA REPAIR     umbilical  . MASS EXCISION N/A 03/18/2018   Procedure: EXCISION CYST ON BACK 2CM;  Surgeon: [name],  MD;  Location: [facility];  Service: General;  Laterality: N/A;  . POLYPECTOMY  03/09/2018   Procedure: POLYPECTOMY;  Surgeon: Danie Binder, MD;  [facility];  Service: Endoscopy;;  ascending, transverse x2; descending, sigmoid  . RIGHT/LEFT HEART CATH AND CORONARY ANGIOGRAPHY N/A 09/05/2017   Procedure: RIGHT/LEFT HEART CATH AND CORONARY ANGIOGRAPHY;  Surgeon: Larey Dresser, MD;  Location: [facility];  Service: Cardiovascular;  Laterality: N/A;  . TEE WITHOUT CARDIOVERSION N/A 07/20/2018   Procedure: TRANSESOPHAGEAL ECHOCARDIOGRAM (TEE);  Surgeon: [name], MD;  Location: [facility];  Service: Open Heart Surgery;  Laterality: N/A;     Social History:   reports that he quit smoking about 39 years ago. His smoking use included cigarettes. He has a 62.50 pack-year smoking history. He has never used smokeless tobacco. He reports current alcohol use. He reports that he does not use drugs.   Family History:  His family history includes Colon polyps in his sister; Heart attack in his mother; Stomach cancer in his father. There is no history of Colon cancer.   Allergies Allergies  Allergen Reactions  . Ciprofloxacin   . Morphine And Related Other (See Comments)    Caused patient to pass out.      Home Medications  Prior to Admission medications   Medication Sig Start Date End Date Taking? Authorizing Provider  acetaminophen (TYLENOL) 500 MG tablet Take 500 mg by mouth every 6 (six) hours as needed.   Yes [provider]  APPLE CIDER VINEGAR PO Take 1 tablet by mouth daily.   Yes [provider]  Ascorbic Acid (VITAMIN C) 1000 MG tablet Take 1,000 mg by mouth daily.   Yes [provider]  atorvastatin (LIPITOR) 20 MG tablet Take 1 tablet by mouth daily. 03/22/20  Yes [provider]  benzonatate (TESSALON) 100 MG capsule Take 100 mg by mouth 3 (three) times daily as needed. 10/19/20  Yes [provider]  dexamethasone  (DECADRON) 6 MG tablet Take 6 mg by mouth daily. 10/19/20  Yes [provider]  diltiazem (CARDIZEM CD) 120 MG 24 hr capsule Take 1 capsule (120 mg total) by mouth daily. 08/06/18  Yes Kathyrn Drown D, NP  Multiple Vitamin (MULTIVITAMIN) tablet Take 1 tablet by mouth daily.   Yes [provider]  zinc gluconate 50 MG tablet Take 50 mg by mouth daily.   Yes [provider]  metoprolol tartrate (LOPRESSOR) 25 MG tablet Take 1 tablet (25 mg total) by mouth 2 (two) times daily. Patient not taking: No sig reported 08/05/18   Tommie Raymond, NP       Christinia Gully, MD Pulmonary and [place] [phone]   After 7:00 pm call Elink  [phone]

## 2020-10-30 NOTE — Progress Notes (Signed)
Arrived to pt bedside for abg, but pt adamantly refused.  RN notified.  This writer discontinued rx due to pt refusal.

## 2020-10-30 NOTE — Plan of Care (Signed)
  Problem: Respiratory: Goal: Will maintain a patent airway Outcome: Progressing Goal: Complications related to the disease process, condition or treatment will be avoided or minimized Outcome: Progressing   Problem: Clinical Measurements: Goal: Respiratory complications will improve Outcome: Not Progressing   Problem: Education: Goal: Knowledge of General Education information will improve Description: Including pain rating scale, medication(s)/side effects and non-pharmacologic comfort measures Outcome: Progressing   Problem: Health Behavior/Discharge Planning: Goal: Ability to manage health-related needs will improve Outcome: Progressing   Problem: Clinical Measurements: Goal: Ability to maintain clinical measurements within normal limits will improve Outcome: Progressing Goal: Will remain free from infection Outcome: Progressing Goal: Diagnostic test results will improve Outcome: Progressing Goal: Cardiovascular complication will be avoided Outcome: Progressing   Problem: Activity: Goal: Risk for activity intolerance will decrease Outcome: Progressing   Problem: Nutrition: Goal: Adequate nutrition will be maintained Outcome: Progressing   Problem: Coping: Goal: Level of anxiety will decrease Outcome: Progressing   Problem: Elimination: Goal: Will not experience complications related to bowel motility Outcome: Progressing Goal: Will not experience complications related to urinary retention Outcome: Progressing   Problem: Pain Managment: Goal: General experience of comfort will improve Outcome: Progressing   Problem: Safety: Goal: Ability to remain free from injury will improve Outcome: Progressing   Problem: Skin Integrity: Goal: Risk for impaired skin integrity will decrease Outcome: Progressing   Problem: Education: Goal: Knowledge of risk factors and measures for prevention of condition will improve Outcome: Progressing   Problem: Coping: Goal:  Psychosocial and spiritual needs will be supported Outcome: Progressing

## 2020-10-30 NOTE — Progress Notes (Signed)
Rapid response nurse at bedside and has spoken with LIP.  Rapid Response nurse spoke to pt about ABG.  Pt refuses ABG at this time.  LIP spoke to Rapid nurse to increase [place] to 60 liters of oxygen.  Pt sats are 85-86%.  Pt states he feels ok. Pt slightly more breathy with conversation but no acute respiratory distress noted currently.  Pt remains alert and oriented.  Since pt refuses ABG Rapid response nurse has left the bedside at this time. Will continue to monitor.

## 2020-10-30 NOTE — Progress Notes (Signed)
Pt sats remain low at 84-86%. Repositioning encouraged.  Left side made O2 sats worse.  Instructed pt to turn to right side.  Pt is unable to prone due to intolerance.  Rapid response at bedside and given history on pt and measures used to increase oxygenation.  LIP Sharlet [place]ina paged and awaiting response.  RT at bedside with Heated [place].  Pt currently at 45% at 100% and NRB.  Charge nurse at Bedside. Pt placed on MX monitor.  Pt remains alert and oriented at this time.

## 2020-10-30 NOTE — Care Management Important Message (Signed)
Important Message  Patient Details IM Letter given to the Patient Name: Lawrence [name] MRN: [id number] Date of Birth: [birth year]   Medicare Important Message Given:  Yes     [name] 10/30/2020, 1:52 PM

## 2020-10-30 NOTE — Consult Note (Signed)
Client currently off the floor, will attempt assessment tomorrow.  Waylan Boga, PMHNP

## 2020-10-30 NOTE — Progress Notes (Signed)
[place] for IV heparin Indication: r/o PE  Allergies  Allergen Reactions  . Ciprofloxacin   . Morphine And Related Other (See Comments)    Caused patient to pass out.     Patient Measurements: Height: 5\' 11"  (180.3 cm) Weight: 97.1 kg (214 lb) IBW/kg (Calculated) : 75.3 Heparin Dosing Weight: 95 kg  Vital Signs: Temp: 97.6 F (36.4 C) (12/20 1400) Temp Source: Axillary (12/20 1400) Pulse Rate: 58 (12/20 1452)  Labs: Recent Labs    10/28/20 0412 10/29/20 0350 10/30/20 0428  HGB 16.8 17.2* 18.0*  HCT 50.0 51.1 53.6*  PLT 243 259 290  CREATININE 0.91 1.01 0.98    Estimated Creatinine Clearance: 85.7 mL/min (by C-G formula based on SCr of 0.98 mg/dL).   Medical History: Past Medical History:  Diagnosis Date  . Aortic atherosclerosis ([place])   . Aortic insufficiency   . Aortic stenosis   . Arthritis   . Bicuspid aortic valve    a. mild AS, mild-mod AI by echo 03/2017, 5cm thoracic aortic aneurysm by CT 07/2017.  . Essential hypertension   . GERD (gastroesophageal reflux disease)   . Gout   . Postoperative atrial fibrillation ([place])   . Thoracic aortic aneurysm ([place])    a. 07/2017: 5cm by CT.    Medications:  Medications Prior to Admission  Medication Sig Dispense Refill Last Dose  . acetaminophen (TYLENOL) 500 MG tablet Take 500 mg by mouth every 6 (six) hours as needed.   Past Week at Unknown time  . APPLE CIDER VINEGAR PO Take 1 tablet by mouth daily.   Past Week at Unknown time  . Ascorbic Acid (VITAMIN C) 1000 MG tablet Take 1,000 mg by mouth daily.   Past Week at Unknown time  . atorvastatin (LIPITOR) 20 MG tablet Take 1 tablet by mouth daily.   Past Week at Unknown time  . benzonatate (TESSALON) 100 MG capsule Take 100 mg by mouth 3 (three) times daily as needed.   Past Week at Unknown time  . dexamethasone (DECADRON) 6 MG tablet Take 6 mg by mouth daily.   10/16/2020 at Unknown time  . diltiazem (CARDIZEM CD) 120 MG  24 hr capsule Take 1 capsule (120 mg total) by mouth daily. 90 capsule 4 Past Week at Unknown time  . Multiple Vitamin (MULTIVITAMIN) tablet Take 1 tablet by mouth daily.   Past Week at Unknown time  . zinc gluconate 50 MG tablet Take 50 mg by mouth daily.   Past Week at Unknown time  . metoprolol tartrate (LOPRESSOR) 25 MG tablet Take 1 tablet (25 mg total) by mouth 2 (two) times daily. (Patient not taking: No sig reported) 180 tablet 4 Not Taking at Unknown time   Scheduled:  . albuterol  2 puff Inhalation Q6H  . vitamin C  500 mg Oral BID  . atorvastatin  20 mg Oral Daily  . cholecalciferol  1,000 Units Oral Daily  . dextromethorphan-guaiFENesin  1 tablet Oral BID  . diltiazem  120 mg Oral Daily  . heparin  3,000 Units Intravenous Once  . insulin aspart  0-15 Units Subcutaneous TID WC  . insulin aspart  0-5 Units Subcutaneous QHS  . insulin glargine  5 Units Subcutaneous BID  . methylPREDNISolone (SOLU-MEDROL) injection  60 mg Intravenous Q12H  . mupirocin ointment  1 application Nasal BID  . [START ON 10/31/2020] pantoprazole  40 mg Oral BID AC  . zinc sulfate  220 mg Oral Daily   Infusions:  .  heparin     Assessment: 56 yoM with PMH aortic insufficiency/stenosis admitted for COVID PNA. Oxygenation worsening today after initial improvement, so patient started on IV heparin per pharmacy dosing while PE ruled out.   Baseline INR, aPTT: not done  Prior anticoagulation: only enoxaparin 40 mg SQ daily, last dose 12/19 at 830p  Significant events:  Today, 10/30/2020:  CBC: Hemoglobin elevated; Plt stable WNL  SCr stable; at baseline  No bleeding or infusion issues per nursing  Goal of Therapy: Heparin level 0.3-0.7 units/ml Monitor platelets by anticoagulation protocol: Yes  Plan:  Heparin 3000 units IV bolus x 1  Heparin 1500 units/hr IV infusion  Check heparin level 6-8 hrs after start  Daily CBC, daily heparin level once stable  Monitor for signs of bleeding  or thrombosis  Reuel Boom, PharmD, BCPS [phone] 10/30/2020, 5:25 PM

## 2020-10-30 NOTE — Progress Notes (Addendum)
PROGRESS NOTE  Lawrence [name] GYF:[id number] DOB: [birth year] DOA: 10/12/2020 PCP: [name], MD  HPI/Recap of past 24 hours: 69 y.o.malewith medical history significant foraortic insufficiency, paroxysmal A. fib,h/oaortic aneurysmadmitted on 11/04/2020 with COVID-19 infection/pneumonia with hypoxic respiratory failureandconcerns about possible syncopal episode PTA Patient initially tested positive for COVID-19 on 10/15/2020.  Patient isNOTvaccinated against COVID-19.  Transferred from [facility] to [facility] due to increased oxygen requirement.  On high flow nasal cannula plus nonrebreather.  Hospital course complicated by medical noncompliance, declining ABG or VBG.  Currently on high doses of IV steroids twice daily and intermittent as needed IV Lasix.  Repeated chest x-ray on 10/30/2020 showing some improvement in lung aeration bilaterally.  10/30/20: Seen and examined at his bedside.  We talked about the importance of medical compliance for quick recovery.  He is receptive.  On high flow nasal cannula 15L along with nonrebreather.  Patient gave the writer permission to call his son [name], [phone] and his sister [name], [phone].   Assessment/Plan: Principal Problem:   Pneumonia due to COVID-19 virus Active Problems:   Atrial fibrillation, chronic (HCC)   Acute respiratory failure with hypoxia (HCC)   Hyperglycemia   Syncope and collapse  Acute hypoxic respiratory failure secondary to COVID-19 viral pneumonia, POA. Currently on high flow nasal cannula 15 L along with nonrebreather with O2 saturation 91%.   Adamantly declines baricitinib.   Completed 5 days of remdesivir on 10/29/2020. Continue IV Solu-Medrol twice daily Continue vitamin C, D3 and zinc. Continue bronchodilators, flutter valve and incentive spirometer. Maintain O2 saturation greater than 92%. Give a dose of IV Lasix 20 mg x 1. Personally reviewed chest x-ray done on 10/30/2020 which shows  improvement of lung aeration D-dimer elevated We will get an ABG if agreeable, if confirms the level of hypoxia, Will obtain CTA chest to rule out PE.  QTC prolongation Admission twelve-lead EKG showing QTC greater than 600 Obtain twelve-lead EKG Avoid QTC prolonging agents Magnesium and potassium at goal.  Leukocytosis likely reactive in the setting of IV steroid use.  Procalcitonin negative no evidence of bacterial infection. WBC is downtrending. Inflammatory markers are also downtrending.  Questionable syncopal episode.  EKG, echocardiogram and troponin all within normal limit.  D-dimer elevated secondary to COVID-19 infection. -Continue to moniter.  Hyperglycemia likely secondary to high doses of IV steroids. Hemoglobin A1c 6.1 on 10/17/2020.   Continue insulin coverage.  Chronic atrial fibrillation.  Currently rate controlled on oral diltiazem.   Not on oral anticoagulation at home.  Continue to monitor on telemetry.  Hyperlipidemia. -Continue statin.   Code Status: DNI, CPR ok, no intubation.  Family Communication: We will call family and give updates.  Disposition Plan: Likely will DC to home once his O2 requirement is less than 5 L nasal cannula.   Consultants:  None.  Procedures:  None.  Antimicrobials:  None.  DVT prophylaxis: Subcu Lovenox daily  Status is: Inpatient    Dispo:  Patient From: Home  Planned Disposition: Home  Expected discharge date: 11/07/2020  Medically stable for discharge: No, ongoing management of acute hypoxic respiratory failure secondary to COVID-19 viral pneumonia.        Objective: Vitals:   10/30/20 0218 10/30/20 0419 10/30/20 0933 10/30/20 1148  BP: (!) 153/93 (!) 139/100    Pulse: 62 64 95 64  Resp: (!) 22 (!) 27 (!) 30 (!) 21  Temp:  97.9 F (36.6 C)    TempSrc:  Axillary    SpO2: (!) 87% Marland Kitchen)  87% (!) 88% (!) 89%  Weight:      Height:        Intake/Output Summary (Last 24 hours) at 10/30/2020  1322 Last data filed at 10/30/2020 1227 Gross per 24 hour  Intake 740 ml  Output 1125 ml  Net -385 ml   Filed Weights   10/24/2020 1322  Weight: 97.1 kg    Exam:  . General: 69 y.o. year-old male well-developed well-nourished conversational dyspnea noted on exam.  Alert and oriented x3.  . Cardiovascular: Regular rate and rhythm no rubs or gallops. Marland Kitchen Respiratory: Mild rales at bases.  No wheezing noted.  Poor inspiratory effort. . Abdomen: Soft nontender normal bowel sounds present.  Musculoskeletal: No lower extremity edema bilaterally.   . Skin: No ulcerative lesions noted. Marland Kitchen Psychiatry: Mood is appropriate for condition.   Data Reviewed: CBC: Recent Labs  Lab 10/26/20 0342 10/27/20 0406 10/28/20 0412 10/29/20 0350 10/30/20 0428  WBC 6.1 9.4 13.1* 11.2* 11.8*  NEUTROABS 5.4 8.5* 11.4* 9.9* 10.4*  HGB 17.0 16.8 16.8 17.2* 18.0*  HCT 51.4 49.8 50.0 51.1 53.6*  MCV 94.1 91.5 91.4 91.9 92.1  PLT 181 201 243 259 749   Basic Metabolic Panel: Recent Labs  Lab 10/26/20 0342 10/27/20 0406 10/27/20 0822 10/28/20 0412 10/29/20 0350 10/30/20 0428  NA 136 137 136 141 140 142  K 3.8 4.2 4.5 4.5 4.8 4.7  CL 102 102 101 101 102 105  CO2 23 21* 22 27 27 26   GLUCOSE 209* 277* 245* 191* 182* 187*  BUN 38* 48* 47* 47* 48* 49*  CREATININE 0.95 1.04 0.99 0.91 1.01 0.98  CALCIUM 8.9 9.0 9.2 9.1 9.1 8.9  MG 2.5* 2.4  --  2.3 2.4 2.3  PHOS 3.2 3.6  --  4.0 3.7 3.7   GFR: Estimated Creatinine Clearance: 85.7 mL/min (by C-G formula based on SCr of 0.98 mg/dL). Liver Function Tests: Recent Labs  Lab 10/27/20 0406 10/27/20 0822 10/28/20 0412 10/29/20 0350 10/30/20 0428  AST 51* 56* 48* 46* 32  ALT 64* 73* 83* 89* 77*  ALKPHOS 46 51 48 47 51  BILITOT 0.7 0.8 1.0 1.0 1.2  PROT 6.3* 6.1* 6.4* 6.4* 6.1*  ALBUMIN 3.3* 3.1* 3.3* 3.2* 3.1*   No results for input(s): LIPASE, AMYLASE in the last 168 hours. No results for input(s): AMMONIA in the last 168 hours. Coagulation  Profile: No results for input(s): INR, PROTIME in the last 168 hours. Cardiac Enzymes: No results for input(s): CKTOTAL, CKMB, CKMBINDEX, TROPONINI in the last 168 hours. BNP (last 3 results) No results for input(s): PROBNP in the last 8760 hours. HbA1C: No results for input(s): HGBA1C in the last 72 hours. CBG: Recent Labs  Lab 10/29/20 1330 10/29/20 1709 10/29/20 2022 10/30/20 0803 10/30/20 1202  GLUCAP 225* 242* 253* 187* 338*   Lipid Profile: No results for input(s): CHOL, HDL, LDLCALC, TRIG, CHOLHDL, LDLDIRECT in the last 72 hours. Thyroid Function Tests: No results for input(s): TSH, T4TOTAL, FREET4, T3FREE, THYROIDAB in the last 72 hours. Anemia Panel: Recent Labs    10/29/20 0350 10/30/20 0428  FERRITIN 882* 831*   Urine analysis:    Component Value Date/Time   COLORURINE YELLOW 10/30/2020 1900   APPEARANCEUR HAZY (A) 10/16/2020 1900   LABSPEC 1.032 (H) 11/06/2020 1900   PHURINE 5.0 10/14/2020 1900   GLUCOSEU 50 (A) 11/10/2020 1900   HGBUR NEGATIVE 11/06/2020 1900   BILIRUBINUR NEGATIVE 10/24/2020 1900   KETONESUR 20 (A) 10/18/2020 1900   PROTEINUR 100 (A)  11/10/2020 1900   UROBILINOGEN 0.2 06/17/2013 1012   NITRITE NEGATIVE 10/14/2020 1900   LEUKOCYTESUR NEGATIVE 10/14/2020 1900   Sepsis Labs: @LABRCNTIP (procalcitonin:4,lacticidven:4)  ) Recent Results (from the past 240 hour(s))  Resp panel by RT-PCR (RSV, Flu A&B, Covid) Nasopharyngeal Swab     Status: Abnormal   Collection Time: 10/29/2020  7:40 PM   Specimen: Nasopharyngeal Swab; Nasopharyngeal(NP) swabs in vial transport medium  Result Value Ref Range Status   SARS Coronavirus 2 by RT PCR POSITIVE (A) NEGATIVE Final    Comment: RESULT CALLED TO, READ BACK BY AND VERIFIED WITH: K BELTON,RN@2052  11/06/2020 MKELLY (NOTE) SARS-CoV-2 target nucleic acids are DETECTED.  The SARS-CoV-2 RNA is generally detectable in upper respiratory specimens during the acute phase of infection. Positive results  are indicative of the presence of the identified virus, but do not rule out bacterial infection or co-infection with other pathogens not detected by the test. Clinical correlation with patient history and other diagnostic information is necessary to determine patient infection status. The expected result is Negative.  Fact Sheet for Patients: EntrepreneurPulse.com.au  Fact Sheet for Healthcare Providers: IncredibleEmployment.be  This test is not yet approved or cleared by the [place] FDA and  has been authorized for detection and/or diagnosis of SARS-CoV-2 by FDA under an Emergency Use Authorization (EUA).  This EUA will remain in effect (meaning this test can be  used) for the duration of  the COVID-19 declaration under Section 564(b)(1) of the Act, 21 U.S.C. section 360bbb-3(b)(1), unless the authorization is terminated or revoked sooner.     Influenza A by PCR NEGATIVE NEGATIVE Final   Influenza B by PCR NEGATIVE NEGATIVE Final    Comment: (NOTE) The Xpert Xpress SARS-CoV-2/FLU/RSV plus assay is intended as an aid in the diagnosis of influenza from Nasopharyngeal swab specimens and should not be used as a sole basis for treatment. Nasal washings and aspirates are unacceptable for Xpert Xpress SARS-CoV-2/FLU/RSV testing.  Fact Sheet for Patients: EntrepreneurPulse.com.au  Fact Sheet for Healthcare Providers: IncredibleEmployment.be  This test is not yet approved or cleared by the [place] FDA and has been authorized for detection and/or diagnosis of SARS-CoV-2 by FDA under an Emergency Use Authorization (EUA). This EUA will remain in effect (meaning this test can be used) for the duration of the COVID-19 declaration under Section 564(b)(1) of the Act, 21 U.S.C. section 360bbb-3(b)(1), unless the authorization is terminated or revoked.     Resp Syncytial Virus by PCR NEGATIVE NEGATIVE Final     Comment: (NOTE) Fact Sheet for Patients: EntrepreneurPulse.com.au  Fact Sheet for Healthcare Providers: IncredibleEmployment.be  This test is not yet approved or cleared by the [place] FDA and has been authorized for detection and/or diagnosis of SARS-CoV-2 by FDA under an Emergency Use Authorization (EUA). This EUA will remain in effect (meaning this test can be used) for the duration of the COVID-19 declaration under Section 564(b)(1) of the Act, 21 U.S.C. section 360bbb-3(b)(1), unless the authorization is terminated or revoked.  Performed at [facility], [address] [place], [place] [address]   Blood Culture (routine x 2)     Status: None   Collection Time: 10/30/2020  9:30 PM   Specimen: Right Antecubital; Blood  Result Value Ref Range Status   Specimen Description RIGHT ANTECUBITAL  Final   Special Requests   Final    BOTTLES DRAWN AEROBIC AND ANAEROBIC Blood Culture adequate volume   Culture   Final    NO GROWTH 5 DAYS Performed at [facility], [address] [place], [place] [address]    Report Status 10/30/2020 FINAL  Final  Blood Culture (routine x 2)     Status: None   Collection Time: 10/19/2020 10:33 PM   Specimen: BLOOD LEFT HAND  Result Value Ref Range Status   Specimen Description BLOOD LEFT HAND  Final   Special Requests   Final    BOTTLES DRAWN AEROBIC AND ANAEROBIC Blood Culture adequate volume   Culture   Final    NO GROWTH 5 DAYS Performed at [facility], [address], [place], [place] [address]    Report Status 10/30/2020 FINAL  Final      Studies: DG CHEST PORT 1 VIEW  Result Date: 10/30/2020 CLINICAL DATA:  Worsening hypoxia; covid pna EXAM: PORTABLE CHEST - 1 VIEW COMPARISON:  the previous day's study FINDINGS: Low lung volumes. Mild diffuse interstitial prominence. Perihilar scattered airspace opacities appears slightly increased on the left, marginally improved on the right. Heart size normal.   Tortuous thoracic aorta. No effusion.  No pneumothorax. Sternotomy wires. IMPRESSION: Slight improvement in bilateral airspace opacities. Electronically Signed   By: Lucrezia Europe M.D.   On: 10/30/2020 08:16    Scheduled Meds: . albuterol  2 puff Inhalation Q6H  . vitamin C  500 mg Oral BID  . atorvastatin  20 mg Oral Daily  . cholecalciferol  1,000 Units Oral Daily  . dextromethorphan-guaiFENesin  1 tablet Oral BID  . diltiazem  120 mg Oral Daily  . enoxaparin (LOVENOX) injection  40 mg Subcutaneous Q24H  . insulin aspart  0-15 Units Subcutaneous TID WC  . insulin aspart  0-5 Units Subcutaneous QHS  . insulin glargine  5 Units Subcutaneous BID  . methylPREDNISolone (SOLU-MEDROL) injection  60 mg Intravenous Q12H  . mupirocin ointment  1 application Nasal BID  . zinc sulfate  220 mg Oral Daily    Continuous Infusions:   LOS: 5 days     Kayleen Memos, MD [facility] Pager [phone]  If 7PM-7AM, please contact night-coverage [email or web address] Password [email or web address] 10/30/2020, 1:22 PM

## 2020-10-30 NOTE — Progress Notes (Addendum)
RT attempted ABG on patient. RT unsuccessful. Another RT will attempt.

## 2020-10-30 NOTE — Progress Notes (Signed)
Rapid Response Event Note   Reason for Call : Pt sats decreased   Initial Focused Assessment: Pt A/O and F/C .  Lung sounds decreased.  See flowsheet for VS.    Interventions:   Pt placed on 45L @ 100% with NRB.  Sats remain in the 80's.  [facility], NP paged and new orders received and initiated.  Pt refused ABG and still does not want to be intubated.  Says that he feels better, now. Pt does not seem to be in distress.   Plan of Care: pt to remain in current location.  Care options explained to pt fully.  Pt doesn't choose to change his treatment at this time.   Dyann Ruddle, RN

## 2020-10-30 NOTE — Progress Notes (Signed)
Pt refuses VBG to be drawn by lab.

## 2020-10-30 NOTE — Progress Notes (Signed)
Patient is severely hypoxic and has declined ABG.  O2 saturation quickly drops with movement, therefore unsafe to transport to CT for CTA chest to r/o PE.  Consulted PCCM, discussed with Dr. Melvyn Novas, we will treat as presumptive PE for now with heparin drip.  If B/L LE duplex US ordered is negative, will dc heparin drip.  Last D-dimer 0.92.  Patient is being transferred to higher level of care, ICU.  Called and updated his sister, [name], via phone.  Called his son [name] and left a Dance movement psychotherapist.

## 2020-10-30 NOTE — Progress Notes (Signed)
Pt O2 sats upper 70's low 80's with correlating pleth waveform. RT called. LIP to be notified.

## 2020-10-31 ENCOUNTER — Inpatient Hospital Stay (HOSPITAL_COMMUNITY): Payer: Medicare HMO

## 2020-10-31 DIAGNOSIS — F411 Generalized anxiety disorder: Secondary | ICD-10-CM | POA: Diagnosis not present

## 2020-10-31 DIAGNOSIS — R7989 Other specified abnormal findings of blood chemistry: Secondary | ICD-10-CM

## 2020-10-31 LAB — CBC WITH DIFFERENTIAL/PLATELET
Abs Immature Granulocytes: 0.14 10*3/uL — ABNORMAL HIGH (ref 0.00–0.07)
Basophils Absolute: 0 10*3/uL (ref 0.0–0.1)
Basophils Relative: 0 %
Eosinophils Absolute: 0 10*3/uL (ref 0.0–0.5)
Eosinophils Relative: 0 %
HCT: 55.2 % — ABNORMAL HIGH (ref 39.0–52.0)
Hemoglobin: 18.5 g/dL — ABNORMAL HIGH (ref 13.0–17.0)
Immature Granulocytes: 1 %
Lymphocytes Relative: 5 %
Lymphs Abs: 0.7 10*3/uL (ref 0.7–4.0)
MCH: 31.3 pg (ref 26.0–34.0)
MCHC: 33.5 g/dL (ref 30.0–36.0)
MCV: 93.2 fL (ref 80.0–100.0)
Monocytes Absolute: 0.3 10*3/uL (ref 0.1–1.0)
Monocytes Relative: 3 %
Neutro Abs: 11.4 10*3/uL — ABNORMAL HIGH (ref 1.7–7.7)
Neutrophils Relative %: 91 %
Platelets: 293 10*3/uL (ref 150–400)
RBC: 5.92 MIL/uL — ABNORMAL HIGH (ref 4.22–5.81)
RDW: 12.9 % (ref 11.5–15.5)
WBC: 12.5 10*3/uL — ABNORMAL HIGH (ref 4.0–10.5)
nRBC: 0 % (ref 0.0–0.2)

## 2020-10-31 LAB — COMPREHENSIVE METABOLIC PANEL
ALT: 70 U/L — ABNORMAL HIGH (ref 0–44)
AST: 32 U/L (ref 15–41)
Albumin: 3.1 g/dL — ABNORMAL LOW (ref 3.5–5.0)
Alkaline Phosphatase: 57 U/L (ref 38–126)
Anion gap: 11 (ref 5–15)
BUN: 50 mg/dL — ABNORMAL HIGH (ref 8–23)
CO2: 25 mmol/L (ref 22–32)
Calcium: 8.9 mg/dL (ref 8.9–10.3)
Chloride: 100 mmol/L (ref 98–111)
Creatinine, Ser: 1.08 mg/dL (ref 0.61–1.24)
GFR, Estimated: >60 mL/min (ref >60)
Glucose, Bld: 314 mg/dL — ABNORMAL HIGH (ref 70–99)
Potassium: 4.6 mmol/L (ref 3.5–5.1)
Sodium: 136 mmol/L (ref 135–145)
Total Bilirubin: 1.2 mg/dL (ref 0.3–1.2)
Total Protein: 6.2 g/dL — ABNORMAL LOW (ref 6.5–8.1)

## 2020-10-31 LAB — GLUCOSE, CAPILLARY
Glucose-Capillary: 285 mg/dL — ABNORMAL HIGH (ref 70–99)
Glucose-Capillary: 283 mg/dL — ABNORMAL HIGH (ref 70–99)
Glucose-Capillary: 179 mg/dL — ABNORMAL HIGH (ref 70–99)
Glucose-Capillary: 225 mg/dL — ABNORMAL HIGH (ref 70–99)

## 2020-10-31 LAB — C-REACTIVE PROTEIN: CRP: 0.7 mg/dL (ref <1.0)

## 2020-10-31 LAB — D-DIMER, QUANTITATIVE: D-Dimer, Quant: 1.34 ug/mL-FEU — ABNORMAL HIGH (ref 0.00–0.50)

## 2020-10-31 LAB — PHOSPHORUS: Phosphorus: 4 mg/dL (ref 2.5–4.6)

## 2020-10-31 LAB — HEPARIN LEVEL (UNFRACTIONATED)
Heparin Unfractionated: 0.44 IU/mL (ref 0.30–0.70)
Heparin Unfractionated: 0.82 IU/mL — ABNORMAL HIGH (ref 0.30–0.70)

## 2020-10-31 LAB — MAGNESIUM: Magnesium: 2.4 mg/dL (ref 1.7–2.4)

## 2020-10-31 LAB — FERRITIN: Ferritin: 871 ng/mL — ABNORMAL HIGH (ref 24–336)

## 2020-10-31 MED ORDER — ALBUTEROL SULFATE HFA 108 (90 BASE) MCG/ACT IN AERS
2.0000 | INHALATION_SPRAY | Freq: Three times a day (TID) | RESPIRATORY_TRACT | Status: DC
  Administered 2020-10-31 (×3): 2 via RESPIRATORY_TRACT

## 2020-10-31 MED ORDER — ENOXAPARIN SODIUM 40 MG/0.4ML ~~LOC~~ SOLN
40.0000 mg | SUBCUTANEOUS | Status: DC
  Administered 2020-10-31 – 2020-11-13 (×14): 40 mg via SUBCUTANEOUS
  Filled 2020-10-31 (×15): qty 0.4

## 2020-10-31 NOTE — Progress Notes (Signed)
Lower extremity venous has been completed.   Preliminary results in CV Proc.   Abram Sander 10/31/2020 9:58 AM

## 2020-10-31 NOTE — Plan of Care (Signed)
  Problem: Clinical Measurements: Goal: Ability to maintain clinical measurements within normal limits will improve Outcome: Progressing Goal: Respiratory complications will improve Outcome: Progressing   Problem: Coping: Goal: Level of anxiety will decrease Outcome: Progressing   Problem: Safety: Goal: Ability to remain free from injury will improve Outcome: Progressing   

## 2020-10-31 NOTE — Evaluation (Signed)
Occupational Therapy Evaluation Patient Details Name: Lawrence [name] MRN: [id number] DOB: [birth year] Today's Date: 10/31/2020    History of Present Illness Patient is a 69 year old male PMH of aortic insufficiency, aortic stenosis,  A. fib, aortic aneurysm presented to the emergency department due to 2-day onset of shortness of breath, diagnosed with COVID.   Clinical Impression   Patient lives alone in a single level home and is independent at baseline. Currently patient with decreased activity tolerance related to cardiopulmonary status on 60L 100% FIO2 HHFNC and PRB requiring set up to min A for UB ADL and max A for LB ADLs. Patient educated on use of breathing techniques, exercises and mobility for recovery. Patient verbalized understanding. Patient will benefit from skilled OT services to improve activity tolerance and cardiopulmonary status  to reduce oxygenation needs in order for patient to return home at discharge.     Follow Up Recommendations  Home health OT;Other (comment) (vs SNF pending progress)    Equipment Recommendations  None      Precautions / Restrictions Precautions Precaution Comments: monitor O2, 60L HHFNC and 100% FIO2, PRB Restrictions Weight Bearing Restrictions: No      Mobility Bed Mobility Overal bed mobility: Needs Assistance Bed Mobility: Supine to Sit;Sit to Supine     Supine to sit: Min guard;HOB elevated Sit to supine: Supervision   General bed mobility comments: S to min G for safety, line management    Transfers                 General transfer comment: deferred, decreased activity tolerance and saturations fluctuating from high 70s to mid 80s    Balance Overall balance assessment: Needs assistance Sitting-balance support: Feet supported Sitting balance-Leahy Scale: Fair                                     ADL either performed or assessed with clinical judgement   ADL Overall ADL's : Needs  assistance/impaired Eating/Feeding: Independent;Bed level Eating/Feeding Details (indicate cue type and reason): to drink from cup Grooming: Set up;Bed level   Upper Body Bathing: Minimal assistance;Sitting;Bed level   Lower Body Bathing: Maximal assistance;Sitting/lateral leans;Sit to/from stand   Upper Body Dressing : Minimal assistance;Sitting   Lower Body Dressing: Maximal assistance;Sitting/lateral leans Lower Body Dressing Details (indicate cue type and reason): patient attempt to don socks seated EOB desats to high 70s requiring max A to complete   Toilet Transfer Details (indicate cue type and reason): deferred, decreased activity tolerance and fluctuating saturations seated EOB Toileting- Clothing Manipulation and Hygiene: Maximal assistance;Bed level         General ADL Comments: patient requiring increased assistance with self care due cardiopulomnary status effecting activity tolerance, global strength                  Pertinent Vitals/Pain Pain Assessment: Faces Faces Pain Scale: No hurt     Hand Dominance Right   Extremity/Trunk Assessment Upper Extremity Assessment Upper Extremity Assessment: Generalized weakness   Lower Extremity Assessment Lower Extremity Assessment: Defer to PT evaluation       Communication Communication Communication: No difficulties   Cognition Arousal/Alertness: Awake/alert Behavior During Therapy: WFL for tasks assessed/performed Overall Cognitive Status: Within Functional Limits for tasks assessed  Exercises Exercises: General Lower Extremity;General Upper Extremity General Exercises - Upper Extremity Shoulder Flexion: AROM;Both;5 reps;Supine General Exercises - Lower Extremity Ankle Circles/Pumps: AROM;Both;5 reps;Supine Quad Sets: Strengthening;Both;5 reps;Supine Gluteal Sets: Strengthening;Both;5 reps;Supine        Home Living Family/patient expects to be  discharged to:: Private residence Living Arrangements: Alone Available Help at Discharge: Family;Available PRN/intermittently Type of Home: House Home Access: Stairs to enter [employer] of Steps: 1   Home Layout: One level     Bathroom Shower/Tub: Tub/shower unit         Home Equipment: Cane - single point;Walker - 2 wheels   Additional Comments: neighbor is getting him a shower chair      Prior Functioning/Environment Level of Independence: Independent                 OT Problem List: Cardiopulmonary status limiting activity;Decreased activity tolerance;Decreased strength;Decreased knowledge of precautions;Impaired balance (sitting and/or standing);Decreased safety awareness      OT Treatment/Interventions: Self-care/ADL training;Therapeutic exercise;Energy conservation;DME and/or AE instruction;Therapeutic activities;Patient/family education;Balance training    OT Goals(Current goals can be found in the care plan section) Acute Rehab OT Goals Patient Stated Goal: home by christmas OT Goal Formulation: With patient Time For Goal Achievement: 11/14/20 Potential to Achieve Goals: Good  OT Frequency: Min 2X/week    AM-PAC OT "6 Clicks" Daily Activity     Outcome Measure Help from another person eating meals?: None Help from another person taking care of personal grooming?: A Little Help from another person toileting, which includes using toliet, bedpan, or urinal?: A Lot Help from another person bathing (including washing, rinsing, drying)?: A Lot Help from another person to put on and taking off regular upper body clothing?: A Little Help from another person to put on and taking off regular lower body clothing?: A Lot 6 Click Score: 16   End of Session Equipment Utilized During Treatment: Oxygen Nurse Communication: Mobility status;Other (comment) (O2 saturations)  Activity Tolerance: Patient limited by fatigue Patient left: in bed;with call  bell/phone within reach;with bed alarm set  OT Visit Diagnosis: Other abnormalities of gait and mobility (R26.89);Muscle weakness (generalized) (M62.81)                Time: 7262-0355 OT Time Calculation (min): 24 min Charges:  OT General Charges $OT Visit: 1 Visit OT Evaluation $OT Eval Low Complexity: 1 Low OT Treatments $Self Care/Home Management : 8-22 mins  Delbert Phenix OT OT pager: [phone]  Rosemary Holms 10/31/2020, 3:05 PM

## 2020-10-31 NOTE — Progress Notes (Signed)
PROGRESS NOTE  Lawrence [name]:[id number] DOB: [birth year] DOA: 11-22-2020 PCP: [name], MD  HPI/Recap of past 24 hours: 69 y.o.malewith medical history significant foraortic insufficiency, paroxysmal A. fib,h/oaortic aneurysmadmitted on Nov 22, 2020 with COVID-19 infection/pneumonia with hypoxic respiratory failureandconcerns about possible syncopal episode PTA Patient initially tested positive for COVID-19 on 10/15/2020.  Patient isNOTvaccinated against COVID-19.  Transferred from [facility] to [place] due to increased oxygen requirement.  On high flow nasal cannula plus nonrebreather.  Hospital course complicated by medical noncompliance, refusing ABG or VBG.  Completed course of remdesivir.  Declined baricitinib.  Currently on high doses of IV steroids twice daily.  Repeated chest x-ray on 10/30/2020 showing some improvement of lung aeration bilaterally.  10/31/20: Seen and examined at his bedside.  He denies chest pain.  Currently on high flow nasal cannula and nonrebreather.  He states he is breathing better.  Assessment/Plan: Principal Problem:   Pneumonia due to COVID-19 virus Active Problems:   Atrial fibrillation, chronic (HCC)   Acute respiratory failure with hypoxia (HCC)   Hyperglycemia   Syncope and collapse   Anxiety state  Acute hypoxic respiratory failure secondary to COVID-19 viral pneumonia, POA. Currently on high flow nasal cannula along with partial NRB with O2 saturation 91-96 %.   Adamantly declines baricitinib.   Completed 5 days of remdesivir on 10/29/2020. Continue IV Solu-Medrol twice daily, wean off as tolerated. Continue vitamin C, D3 and zinc. Continue bronchodilators, flutter valve and incentive spirometer. Continue to maintain O2 saturation greater than 92%. Personally reviewed chest x-ray done on 10/30/2020 which shows improvement of lung aeration D-dimer elevated, bilateral lower extremity Doppler ultrasound negative for DVT. He  was briefly on anticoagulation, heparin drip, from 10/30/20 -10/31/20 due to concern for possible PE,  We were unable to obtain CTA PE due to quick drop of O2 sat with slight movement.  Consulted PCCM, discussed with Dr. Melvyn Novas, who recommended to dc hep drip if B/L LE doppler came back negative.   QTC prolongation Admission twelve-lead EKG showing QTC greater than 600, improved to 474 on 10/30/2020 Continue to avoid QTC prolonging agents Continue to optimize magnesium and potassium levels.  Leukocytosis likely reactive in the setting of IV steroid use.  Procalcitonin negative no evidence of bacterial infection.  Questionable syncopal episode.  EKG, echocardiogram and troponin all within normal limit.  D-dimer elevated secondary to COVID-19 infection. -Continue to moniter.  Hyperglycemia likely secondary to high doses of IV steroids. Hemoglobin A1c 6.1 on 11-22-2020.   Continue insulin coverage.  Chronic atrial fibrillation.  Currently rate controlled on oral diltiazem.   Not on oral anticoagulation at home.  Continue to monitor on telemetry.  Hyperlipidemia. -Continue statin.   Code Status: DNI, CPR ok, no intubation.  Family Communication: Updated his sister via phone on 10/30/2020.  Called his son and left a message.  His son [name] [phone] and his sister [name], [phone].   Disposition Plan: Likely will DC to home once his O2 requirement is less than 5 L nasal cannula.   Consultants:  None.  Procedures:  None.  Antimicrobials:  None.  DVT prophylaxis: Subcu Lovenox daily  Status is: Inpatient    Dispo:  Patient From: Home  Planned Disposition: Home  Expected discharge date: 11/07/2020  Medically stable for discharge: No, ongoing management of acute hypoxic respiratory failure secondary to COVID-19 viral pneumonia.        Objective: Vitals:   10/31/20 0600 10/31/20 0944 10/31/20 1234 10/31/20 1331  BP: (!) 138/97 Marland Kitchen)  144/75 (!) 141/86  (!) 145/91  Pulse: 80 (!) 53 67 66  Resp: 19 20 (!) 25 (!) 23  Temp: 97.7 F (36.5 C)   97.7 F (36.5 C)  TempSrc: Axillary     SpO2: 90% (!) 87% 94% 92%  Weight:      Height:        Intake/Output Summary (Last 24 hours) at 10/31/2020 1702 Last data filed at 10/31/2020 1300 Gross per 24 hour  Intake 950.07 ml  Output 750 ml  Net 200.07 ml   Filed Weights   10/13/2020 1322  Weight: 97.1 kg    Exam:  . General: 69 y.o. year-old male chronically ill-appearing no acute distress.  Alert and oriented x3.  On high flow nasal cannula and on partial NRB . Cardiovascular: Regular rate and rhythm no rubs or gallops.  Marland Kitchen Respiratory: Mild rales at bases no wheezing noted.  Poor respiratory effort.   . Abdomen: Soft nontender normal bowel sounds present.  . Musculoskeletal: No lower extremity edema bilaterally.   Marland Kitchen Psychiatry: Mood is appropriate for condition and setting.   Data Reviewed: CBC: Recent Labs  Lab 10/27/20 0406 10/28/20 0412 10/29/20 0350 10/30/20 0428 10/31/20 0728  WBC 9.4 13.1* 11.2* 11.8* 12.5*  NEUTROABS 8.5* 11.4* 9.9* 10.4* 11.4*  HGB 16.8 16.8 17.2* 18.0* 18.5*  HCT 49.8 50.0 51.1 53.6* 55.2*  MCV 91.5 91.4 91.9 92.1 93.2  PLT 201 243 259 290 0000000   Basic Metabolic Panel: Recent Labs  Lab 10/27/20 0406 10/27/20 0822 10/28/20 0412 10/29/20 0350 10/30/20 0428 10/31/20 0728  NA 137 136 141 140 142 136  K 4.2 4.5 4.5 4.8 4.7 4.6  CL 102 101 101 102 105 100  CO2 21* 22 27 27 26 25   GLUCOSE 277* 245* 191* 182* 187* 314*  BUN 48* 47* 47* 48* 49* 50*  CREATININE 1.04 0.99 0.91 1.01 0.98 1.08  CALCIUM 9.0 9.2 9.1 9.1 8.9 8.9  MG 2.4  --  2.3 2.4 2.3 2.4  PHOS 3.6  --  4.0 3.7 3.7 4.0   GFR: Estimated Creatinine Clearance: 77.8 mL/min (by C-G formula based on SCr of 1.08 mg/dL). Liver Function Tests: Recent Labs  Lab 10/27/20 0822 10/28/20 0412 10/29/20 0350 10/30/20 0428 10/31/20 0728  AST 56* 48* 46* 32 32  ALT 73* 83* 89* 77* 70*   ALKPHOS 51 48 47 51 57  BILITOT 0.8 1.0 1.0 1.2 1.2  PROT 6.1* 6.4* 6.4* 6.1* 6.2*  ALBUMIN 3.1* 3.3* 3.2* 3.1* 3.1*   No results for input(s): LIPASE, AMYLASE in the last 168 hours. No results for input(s): AMMONIA in the last 168 hours. Coagulation Profile: No results for input(s): INR, PROTIME in the last 168 hours. Cardiac Enzymes: No results for input(s): CKTOTAL, CKMB, CKMBINDEX, TROPONINI in the last 168 hours. BNP (last 3 results) No results for input(s): PROBNP in the last 8760 hours. HbA1C: No results for input(s): HGBA1C in the last 72 hours. CBG: Recent Labs  Lab 10/30/20 1202 10/30/20 1602 10/30/20 2203 10/31/20 0805 10/31/20 1329  GLUCAP 338* 163* 261* 285* 283*   Lipid Profile: No results for input(s): CHOL, HDL, LDLCALC, TRIG, CHOLHDL, LDLDIRECT in the last 72 hours. Thyroid Function Tests: No results for input(s): TSH, T4TOTAL, FREET4, T3FREE, THYROIDAB in the last 72 hours. Anemia Panel: Recent Labs    10/30/20 0428 10/31/20 0728  FERRITIN 831* 871*   Urine analysis:    Component Value Date/Time   COLORURINE YELLOW 10/16/2020 1900   APPEARANCEUR HAZY (A)  10/27/2020 1900   LABSPEC 1.032 (H) 11/04/2020 1900   PHURINE 5.0 10/20/2020 1900   GLUCOSEU 50 (A) 11/10/2020 1900   HGBUR NEGATIVE 10/27/2020 1900   BILIRUBINUR NEGATIVE 11/07/2020 1900   KETONESUR 20 (A) 11/01/2020 1900   PROTEINUR 100 (A) 10/21/2020 1900   UROBILINOGEN 0.2 06/17/2013 1012   NITRITE NEGATIVE 10/16/2020 1900   LEUKOCYTESUR NEGATIVE 10/24/2020 1900   Sepsis Labs: @LABRCNTIP (procalcitonin:4,lacticidven:4)  ) Recent Results (from the past 240 hour(s))  Resp panel by RT-PCR (RSV, Flu A&B, Covid) Nasopharyngeal Swab     Status: Abnormal   Collection Time: 10/15/2020  7:40 PM   Specimen: Nasopharyngeal Swab; Nasopharyngeal(NP) swabs in vial transport medium  Result Value Ref Range Status   SARS Coronavirus 2 by RT PCR POSITIVE (A) NEGATIVE Final    Comment: RESULT CALLED TO,  READ BACK BY AND VERIFIED WITH: K BELTON,RN@2052  10/11/2020 MKELLY (NOTE) SARS-CoV-2 target nucleic acids are DETECTED.  The SARS-CoV-2 RNA is generally detectable in upper respiratory specimens during the acute phase of infection. Positive results are indicative of the presence of the identified virus, but do not rule out bacterial infection or co-infection with other pathogens not detected by the test. Clinical correlation with patient history and other diagnostic information is necessary to determine patient infection status. The expected result is Negative.  Fact Sheet for Patients: EntrepreneurPulse.com.au  Fact Sheet for Healthcare Providers: IncredibleEmployment.be  This test is not yet approved or cleared by the [place] FDA and  has been authorized for detection and/or diagnosis of SARS-CoV-2 by FDA under an Emergency Use Authorization (EUA).  This EUA will remain in effect (meaning this test can be  used) for the duration of  the COVID-19 declaration under Section 564(b)(1) of the Act, 21 U.S.C. section 360bbb-3(b)(1), unless the authorization is terminated or revoked sooner.     Influenza A by PCR NEGATIVE NEGATIVE Final   Influenza B by PCR NEGATIVE NEGATIVE Final    Comment: (NOTE) The Xpert Xpress SARS-CoV-2/FLU/RSV plus assay is intended as an aid in the diagnosis of influenza from Nasopharyngeal swab specimens and should not be used as a sole basis for treatment. Nasal washings and aspirates are unacceptable for Xpert Xpress SARS-CoV-2/FLU/RSV testing.  Fact Sheet for Patients: EntrepreneurPulse.com.au  Fact Sheet for Healthcare Providers: IncredibleEmployment.be  This test is not yet approved or cleared by the [place] FDA and has been authorized for detection and/or diagnosis of SARS-CoV-2 by FDA under an Emergency Use Authorization (EUA). This EUA will remain in effect  (meaning this test can be used) for the duration of the COVID-19 declaration under Section 564(b)(1) of the Act, 21 U.S.C. section 360bbb-3(b)(1), unless the authorization is terminated or revoked.     Resp Syncytial Virus by PCR NEGATIVE NEGATIVE Final    Comment: (NOTE) Fact Sheet for Patients: EntrepreneurPulse.com.au  Fact Sheet for Healthcare Providers: IncredibleEmployment.be  This test is not yet approved or cleared by the [place] FDA and has been authorized for detection and/or diagnosis of SARS-CoV-2 by FDA under an Emergency Use Authorization (EUA). This EUA will remain in effect (meaning this test can be used) for the duration of the COVID-19 declaration under Section 564(b)(1) of the Act, 21 U.S.C. section 360bbb-3(b)(1), unless the authorization is terminated or revoked.  Performed at [facility], [address], [place], [place] [address]   Blood Culture (routine x 2)     Status: None   Collection Time: 11/05/2020  9:30 PM   Specimen: Right Antecubital; Blood  Result Value Ref  Range Status   Specimen Description RIGHT ANTECUBITAL  Final   Special Requests   Final    BOTTLES DRAWN AEROBIC AND ANAEROBIC Blood Culture adequate volume   Culture   Final    NO GROWTH 5 DAYS Performed at [facility], [address] [place], [place] [address]    Report Status 10/30/2020 FINAL  Final  Blood Culture (routine x 2)     Status: None   Collection Time: 10/28/2020 10:33 PM   Specimen: BLOOD LEFT HAND  Result Value Ref Range Status   Specimen Description BLOOD LEFT HAND  Final   Special Requests   Final    BOTTLES DRAWN AEROBIC AND ANAEROBIC Blood Culture adequate volume   Culture   Final    NO GROWTH 5 DAYS Performed at [facility], [address] [place],  [address]    Report Status 10/30/2020 FINAL  Final      Studies: VAS [place] LOWER EXTREMITY VENOUS (DVT)  Result Date: 10/31/2020  Lower Venous DVT Study Indications:  Elevated ddimer.  Performing Technologist: Abram Sander RVS  Examination Guidelines: A complete evaluation includes B-mode imaging, spectral Doppler, color Doppler, and power Doppler as needed of all accessible portions of each vessel. Bilateral testing is considered an integral part of a complete examination. Limited examinations for reoccurring indications may be performed as noted. The reflux portion of the exam is performed with the patient in reverse Trendelenburg.  +---------+---------------+---------+-----------+----------+--------------+ RIGHT    CompressibilityPhasicitySpontaneityPropertiesThrombus Aging +---------+---------------+---------+-----------+----------+--------------+ CFV      Full           Yes      Yes                                 +---------+---------------+---------+-----------+----------+--------------+ SFJ      Full                                                        +---------+---------------+---------+-----------+----------+--------------+ FV Prox  Full                                                        +---------+---------------+---------+-----------+----------+--------------+ FV Mid   Full                                                        +---------+---------------+---------+-----------+----------+--------------+ FV DistalFull                                                        +---------+---------------+---------+-----------+----------+--------------+ PFV      Full                                                        +---------+---------------+---------+-----------+----------+--------------+  POP      Full           Yes      Yes                                 +---------+---------------+---------+-----------+----------+--------------+ PTV      Full                                                        +---------+---------------+---------+-----------+----------+--------------+ PERO     Full                                                         +---------+---------------+---------+-----------+----------+--------------+   +---------+---------------+---------+-----------+----------+--------------+ LEFT     CompressibilityPhasicitySpontaneityPropertiesThrombus Aging +---------+---------------+---------+-----------+----------+--------------+ CFV      Full           Yes      Yes                                 +---------+---------------+---------+-----------+----------+--------------+ SFJ      Full                                                        +---------+---------------+---------+-----------+----------+--------------+ FV Prox  Full                                                        +---------+---------------+---------+-----------+----------+--------------+ FV Mid   Full                                                        +---------+---------------+---------+-----------+----------+--------------+ FV DistalFull                                                        +---------+---------------+---------+-----------+----------+--------------+ PFV      Full                                                        +---------+---------------+---------+-----------+----------+--------------+ POP      Full           Yes      Yes                                 +---------+---------------+---------+-----------+----------+--------------+  PTV      Full                                                        +---------+---------------+---------+-----------+----------+--------------+ PERO     Full                                                        +---------+---------------+---------+-----------+----------+--------------+     Summary: BILATERAL: - No evidence of deep vein thrombosis seen in the lower extremities, bilaterally. - No evidence of superficial venous thrombosis in the lower extremities, bilaterally. -No evidence of popliteal cyst,  bilaterally.   *See table(s) above for measurements and observations.    Preliminary     Scheduled Meds: . albuterol  2 puff Inhalation TID  . vitamin C  500 mg Oral BID  . atorvastatin  20 mg Oral Daily  . cholecalciferol  1,000 Units Oral Daily  . dextromethorphan-guaiFENesin  1 tablet Oral BID  . diltiazem  120 mg Oral Daily  . enoxaparin (LOVENOX) injection  40 mg Subcutaneous Q24H  . insulin aspart  0-15 Units Subcutaneous TID WC  . insulin aspart  0-5 Units Subcutaneous QHS  . insulin glargine  5 Units Subcutaneous BID  . methylPREDNISolone (SOLU-MEDROL) injection  60 mg Intravenous Q12H  . mupirocin ointment  1 application Nasal BID  . pantoprazole  40 mg Oral BID AC  . zinc sulfate  220 mg Oral Daily    Continuous Infusions:   LOS: 6 days     Kayleen Memos, MD [facility] Pager [phone]  If 7PM-7AM, please contact night-coverage [email or web address] Password [facility] 10/31/2020, 5:02 PM

## 2020-10-31 NOTE — Progress Notes (Signed)
NAME:  Lawrence [name], MRN:  [id number], DOB:  15-Dec-1950, LOS: 6 ADMISSION DATE:  10/22/2020, CONSULTATION DATE:  12/20 REFERRING MD:  [facility]/ [facility] CHIEF COMPLAINT:  Worsening sats in covid pna   Brief History:  56 yowm unvaccinated "I'll never take the shot"  with remote smoking hx/ aortic valve dz admitted 12/15 with about 10-15 days  days  Of a flu like syndrome and had reportedly tested positive for covid 12/5  > admit with pna  acute resp failure but has been refusing recommendations by [facility] including use of Baricitinb   History of Present Illness:  69 y.o. male with medical history significant for aortic insufficiency, aortic stenosis,  A. fib, aortic aneurysm presentedto the emergency department due to 2-day onset of shortness of breath.  Patient complained of more than 1 week onset of upper respiratory tract infection symptoms including cough, sore throat, nasal congestion, fever and chills and an episode of nausea and nonbloody vomiting.  These symptoms resolved after about a week, but returned after a few days with same symptoms including productive cough, nasal congestion and general body aches.  Patient states that he had been lying on the couch for about 2 days without being aware of people knocking on his door, neither did he hear his phone ring.  Apparently, friends and family have been trying to reach him during this period, so he thought that he must have been in a "coma".  He notified his sister in [place] and she activated EMS and patient was taken to the ED for further evaluation and management. He states that he tested positive for Covid on 12/5.   ED Course:  In the emergency department, he was tachypneic, tachycardic and hypoxic with an O2 sat of 88% on room air, supplemental oxygen at 4 PM was provided with improvement in O2sat to low 90s.  Work-up in the ED showed H/H 19.5/57.8, hyperglycemia, urinalysis which was unimpressive for UTI.  Ferritin 940, CRP 16.2,  fibrinogen 620, D-dimer 0.92.  SARS coronavirus 2 was positive.  Chest x-ray showed patchy opacity at the lung bases and right midlung suspicious for atelectasis versus pneumonia. He was treated with IV antibiotics (ceftriaxone and azithromycin).  IV NS 500 mL was given. Hospitalist was asked to admit patient for further evaluation and management.  Past Medical History:  Aortic insufficiency, Aortic stenosis Arthritis, Bicuspid aortic valve Essential hypertension, GERD (gastroesophageal reflux disease),  Gout, Postoperative atrial fibrillation ([place]) Thoracic aortic aneurysm ([place]).    Significant Hospital Events:     Consults:  PCCM  12/20  Procedures:    Significant Diagnostic Tests:  Echo 12/16  Ok with estimated RAP  3 mmHg Venous Dopplers done 12/21 >>>   Micro Data:  BC x 2  12/15  Neg Resp viral panel PCR  12/15  Flu neg/  COVID POS  Antimicrobials:  Zmax 12/15 only Rocephin 12/15 only  Remdesovir 12/15 - 12/19    Scheduled Meds: . albuterol  2 puff Inhalation TID  . vitamin C  500 mg Oral BID  . atorvastatin  20 mg Oral Daily  . cholecalciferol  1,000 Units Oral Daily  . dextromethorphan-guaiFENesin  1 tablet Oral BID  . diltiazem  120 mg Oral Daily  . insulin aspart  0-15 Units Subcutaneous TID WC  . insulin aspart  0-5 Units Subcutaneous QHS  . insulin glargine  5 Units Subcutaneous BID  . methylPREDNISolone (SOLU-MEDROL) injection  60 mg Intravenous Q12H  . mupirocin ointment  1 application Nasal  BID  . pantoprazole  40 mg Oral BID AC  . zinc sulfate  220 mg Oral Daily   Continuous Infusions: . heparin 1,500 Units/hr (10/30/20 1820)   PRN Meds:.acetaminophen, guaiFENesin-dextromethorphan, ondansetron **OR** ondansetron (ZOFRAN) IV, sodium chloride     Interim History / Subjective:   no change overnight in terms of symptoms, still refusing blood checks / has not tried bipap  Objective   Blood pressure (!) 154/95, pulse (!) 59, temperature 98 F  (36.7 C), temperature source Axillary, resp. rate (!) 23, height 5\' 11"  (1.803 m), weight 97.1 kg, SpO2 (!) 86 %.    FiO2 (%):  [100 %] 100 %   Intake/Output Summary (Last 24 hours) at 10/31/2020 0545 Last data filed at 10/31/2020 0544 Gross per 24 hour  Intake 1410.07 ml  Output 1850 ml  Net -439.93 ml   Filed Weights   2020-11-06 1322  Weight: 97.1 kg    Examination: ? 86 on 100% ?  (very poor waveform but when good wave form typically in 90s Tmax  98 Pt alert, approp nad @45  degrees hob  No jvd Oropharynx clear,  mucosa nl Neck supple Lungs with  Min scattered exp > insp rhonchi bilaterally RRR no s3 or or sign murmur Abd soft with  Nl excursion  Extr warm with no edema or clubbing noted Neuro  Sensorium intact,  no apparent motor deficits         Resolved Hospital Problem list      Assessment & Plan:   1)  Covid 19 pneumonia complicated by hypoxemic resp failure  - s/p full course remdisovir and on approp steroid rx - refused Baricitinib / refused further blood work or intubation and he appears to be of sound mind and also refuses ET  - willing to consider bipap but I doubt he'll cooperate with that either   So no need for  ICU bed/ can try some bipap on floor prn / remains DNI/ nothing else to offer here which probably won't be needed anyway now > 15 days out from onset of symptoms with improving cxr but pt appears to be  Making sound decision to refuse care "if I don't make it, so be it"   2)  Mild polycythemia ? Etiology should be protective in this setting no need to w/u   3) Elevated d dimer:   while  high normal value (seen commonly in the elderly or chronically ill and virtually every covid pt both acutely and post acute illness)   may miss small peripheral pe, the clot burden with sob is moderately high and the d dimer  has a very high neg pred value if used in this setting.   >>> if venous dopplers neg (still pending)  I would change back to lovenox  prophylactic doses    Please call if further questions - PCCM f/u is prn    Christinia Gully, MD Pulmonary and [place] Cell [phone]   After 7:00 pm call El[facility]  [phone]      Best practice (evaluated daily)  Diet: per [facility] Pain/Anxiety/Delirium protocol (if indicated): per [facility] VAP protocol (if indicated): DVT prophylaxis: IV hep until venous dopplers neg  GI prophylaxis:  PPI Glucose control: per [facility]  Mobility: up as tol/ prone if possible while in bed  Disposition:ICU      Labs   CBC: Recent Labs  Lab 10/26/20 0342 10/27/20 0406 10/28/20 0412 10/29/20 0350 10/30/20 0428  WBC 6.1 9.4 13.1* 11.2*  11.8*  NEUTROABS 5.4 8.5* 11.4* 9.9* 10.4*  HGB 17.0 16.8 16.8 17.2* 18.0*  HCT 51.4 49.8 50.0 51.1 53.6*  MCV 94.1 91.5 91.4 91.9 92.1  PLT 181 201 243 259 [id number]    Basic Metabolic Panel: Recent Labs  Lab 10/26/20 0342 10/27/20 0406 10/27/20 0822 10/28/20 0412 10/29/20 0350 10/30/20 0428  NA 136 137 136 141 140 142  K 3.8 4.2 4.5 4.5 4.8 4.7  CL 102 102 101 101 102 105  CO2 23 21* 22 27 27 26   GLUCOSE 209* 277* 245* 191* 182* 187*  BUN 38* 48* 47* 47* 48* 49*  CREATININE 0.95 1.04 0.99 0.91 1.01 0.98  CALCIUM 8.9 9.0 9.2 9.1 9.1 8.9  MG 2.5* 2.4  --  2.3 2.4 2.3  PHOS 3.2 3.6  --  4.0 3.7 3.7   GFR: Estimated Creatinine Clearance: 85.7 mL/min (by C-G formula based on SCr of 0.98 mg/dL). Recent Labs  Lab 11/08/2020 2130 10/26/20 0014 10/26/20 0342 10/27/20 0406 10/28/20 0412 10/29/20 0350 10/30/20 0428  PROCALCITON <0.10  --   --  <0.10 <0.10 <0.10  --   WBC  --   --    < > 9.4 13.1* 11.2* 11.8*  LATICACIDVEN 1.6 1.1  --   --   --   --   --    < > = values in this interval not displayed.    Liver Function Tests: Recent Labs  Lab 10/27/20 0406 10/27/20 0822 10/28/20 0412 10/29/20 0350 10/30/20 0428  AST 51* 56* 48* 46* 32  ALT 64* 73* 83* 89* 77*  ALKPHOS 46 51 48 47 51  BILITOT 0.7 0.8 1.0 1.0  1.2  PROT 6.3* 6.1* 6.4* 6.4* 6.1*  ALBUMIN 3.3* 3.1* 3.3* 3.2* 3.1*   No results for input(s): LIPASE, AMYLASE in the last 168 hours. No results for input(s): AMMONIA in the last 168 hours.  ABG    Component Value Date/Time   PHART 7.339 (L) 07/21/2018 0000   PCO2ART 39.6 07/21/2018 0000   PO2ART 94.0 07/21/2018 0000   HCO3 21.3 07/21/2018 0000   TCO2 23 07/21/2018 1555   ACIDBASEDEF 4.0 (H) 07/21/2018 0000   O2SAT 97.0 07/21/2018 0000     Coagulation Profile: No results for input(s): INR, PROTIME in the last 168 hours.  Cardiac Enzymes: No results for input(s): CKTOTAL, CKMB, CKMBINDEX, TROPONINI in the last 168 hours.  HbA1C: Hgb A1c MFr Bld  Date/Time Value Ref Range Status  11/06/2020 09:30 PM 6.1 (H) 4.8 - 5.6 % Final    Comment:    (NOTE) Pre diabetes:          5.7%-6.4%  Diabetes:              >6.4%  Glycemic control for   <7.0% adults with diabetes   07/16/2018 04:08 PM 6.0 (H) 4.8 - 5.6 % Final    Comment:    (NOTE) Pre diabetes:          5.7%-6.4% Diabetes:              >6.4% Glycemic control for   <7.0% adults with diabetes     CBG: Recent Labs  Lab 10/29/20 2022 10/30/20 0803 10/30/20 1202 10/30/20 1602 10/30/20 2203  GLUCAP 253* 187* 338* 163* 261*       Surgical History:   Past Surgical History:  Procedure Laterality Date  . ASCENDING AORTIC ROOT REPLACEMENT N/A 07/20/2018   Procedure: REPLACMENT OF ASCENDING AORTA WITH 32MM GRAFT. HYPOTHERMIC CIRCULATORY ARREST. WHEAT PROCEDURE.;  Surgeon: [name], MD;  Location: [place];  Service: Open Heart Surgery;  Laterality: N/A;  . COLONOSCOPY N/A 08/29/2016   Procedure: COLONOSCOPY;  Surgeon: Danie Binder, MD;  [facility];  Service: Endoscopy;  Laterality: N/A;  1030  . COLONOSCOPY N/A 03/09/2018   Procedure: COLONOSCOPY;  Surgeon: Danie Binder, MD;  [facility];  Service: Endoscopy;  Laterality: N/A;  1:15pm  . HERNIA REPAIR     umbilical  . MASS  EXCISION N/A 03/18/2018   Procedure: EXCISION CYST ON BACK 2CM;  Surgeon: [name], MD;  Location: [facility];  Service: General;  Laterality: N/A;  . POLYPECTOMY  03/09/2018   Procedure: POLYPECTOMY;  Surgeon: Danie Binder, MD;  [facility];  Service: Endoscopy;;  ascending, transverse x2; descending, sigmoid  . RIGHT/LEFT HEART CATH AND CORONARY ANGIOGRAPHY N/A 09/05/2017   Procedure: RIGHT/LEFT HEART CATH AND CORONARY ANGIOGRAPHY;  Surgeon: Larey Dresser, MD;  Location: [facility];  Service: Cardiovascular;  Laterality: N/A;  . TEE WITHOUT CARDIOVERSION N/A 07/20/2018   Procedure: TRANSESOPHAGEAL ECHOCARDIOGRAM (TEE);  Surgeon: [name], MD;  Location: [facility];  Service: Open Heart Surgery;  Laterality: N/A;

## 2020-10-31 NOTE — Progress Notes (Signed)
[facility] for IV heparin Indication: r/o PE  Allergies  Allergen Reactions  . Ciprofloxacin   . Morphine And Related Other (See Comments)    Caused patient to pass out.     Patient Measurements: Height: 5\' 11"  (180.3 cm) Weight: 97.1 kg (214 lb) IBW/kg (Calculated) : 75.3 Heparin Dosing Weight: 95 kg  Vital Signs: Temp: 98 F (36.7 C) (12/20 2144) Temp Source: Axillary (12/20 2144) BP: 154/95 (12/20 2144) Pulse Rate: 59 (12/20 2144)  Labs: Recent Labs    10/28/20 0412 10/29/20 0350 10/30/20 0428 10/31/20 0142  HGB 16.8 17.2* 18.0*  --   HCT 50.0 51.1 53.6*  --   PLT 243 259 290  --   HEPARINUNFRC  --   --   --  0.44  CREATININE 0.91 1.01 0.98  --     Estimated Creatinine Clearance: 85.7 mL/min (by C-G formula based on SCr of 0.98 mg/dL).   Medical History: Past Medical History:  Diagnosis Date  . Aortic atherosclerosis ([place])   . Aortic insufficiency   . Aortic stenosis   . Arthritis   . Bicuspid aortic valve    a. mild AS, mild-mod AI by echo 03/2017, 5cm thoracic aortic aneurysm by CT 07/2017.  . Essential hypertension   . GERD (gastroesophageal reflux disease)   . Gout   . Postoperative atrial fibrillation ([place])   . Thoracic aortic aneurysm ([place])    a. 07/2017: 5cm by CT.    Medications:  Medications Prior to Admission  Medication Sig Dispense Refill Last Dose  . acetaminophen (TYLENOL) 500 MG tablet Take 500 mg by mouth every 6 (six) hours as needed.   Past Week at Unknown time  . APPLE CIDER VINEGAR PO Take 1 tablet by mouth daily.   Past Week at Unknown time  . Ascorbic Acid (VITAMIN C) 1000 MG tablet Take 1,000 mg by mouth daily.   Past Week at Unknown time  . atorvastatin (LIPITOR) 20 MG tablet Take 1 tablet by mouth daily.   Past Week at Unknown time  . benzonatate (TESSALON) 100 MG capsule Take 100 mg by mouth 3 (three) times daily as needed.   Past Week at Unknown time  . dexamethasone (DECADRON) 6 MG  tablet Take 6 mg by mouth daily.   11/06/2020 at Unknown time  . diltiazem (CARDIZEM CD) 120 MG 24 hr capsule Take 1 capsule (120 mg total) by mouth daily. 90 capsule 4 Past Week at Unknown time  . Multiple Vitamin (MULTIVITAMIN) tablet Take 1 tablet by mouth daily.   Past Week at Unknown time  . zinc gluconate 50 MG tablet Take 50 mg by mouth daily.   Past Week at Unknown time  . metoprolol tartrate (LOPRESSOR) 25 MG tablet Take 1 tablet (25 mg total) by mouth 2 (two) times daily. (Patient not taking: No sig reported) 180 tablet 4 Not Taking at Unknown time   Scheduled:  . albuterol  2 puff Inhalation TID  . vitamin C  500 mg Oral BID  . atorvastatin  20 mg Oral Daily  . cholecalciferol  1,000 Units Oral Daily  . dextromethorphan-guaiFENesin  1 tablet Oral BID  . diltiazem  120 mg Oral Daily  . insulin aspart  0-15 Units Subcutaneous TID WC  . insulin aspart  0-5 Units Subcutaneous QHS  . insulin glargine  5 Units Subcutaneous BID  . methylPREDNISolone (SOLU-MEDROL) injection  60 mg Intravenous Q12H  . mupirocin ointment  1 application Nasal BID  .  pantoprazole  40 mg Oral BID AC  . zinc sulfate  220 mg Oral Daily   Infusions:  . heparin 1,500 Units/hr (10/30/20 1820)   Assessment: 81 yoM with PMH aortic insufficiency/stenosis admitted for COVID PNA. Oxygenation worsening today after initial improvement, so patient started on IV heparin per pharmacy dosing while PE/DVT ruled out.   Baseline INR, aPTT: not done  Prior anticoagulation: enoxaparin 40 mg SQ daily, last dose 12/19 at 830p  Significant events:  Today, 10/31/2020:  Initial heparin level 0.44- therapeutic on heparin 1500 units/hr  CBC: Hemoglobin elevated; Plt stable WNL  SCr stable; at baseline  No bleeding, IV site changed & no further infusion issues per nursing  Goal of Therapy: Heparin level 0.3-0.7 units/ml Monitor platelets by anticoagulation protocol: Yes  Plan:  Continue Heparin infusion @ 1500  units/hr   Recheck heparin level in 6 hrs   Daily CBC, daily heparin level once stable  Monitor for signs of bleeding or thrombosis  F/U dopplers to r/o DVT   Netta Cedars, PharmD, BCPS 10/31/2020, 2:17 AM

## 2020-10-31 NOTE — Progress Notes (Signed)
ABG has been delayed on several occassions due to PT manipulating 02 devices. PT has repeately been encouraged and educated on importance of oxygen and delivery devices. Current Sp02 94% with devices on. PT refuses ABG - RT to notify MD.

## 2020-10-31 NOTE — Progress Notes (Signed)
Inpatient Diabetes Program Recommendations  AACE/ADA: New Consensus Statement on Inpatient Glycemic Control (2015)  Target Ranges:  Prepandial:   less than 140 mg/dL      Peak postprandial:   less than 180 mg/dL (1-2 hours)      Critically ill patients:  140 - 180 mg/dL   Lab Results  Component Value Date   GLUCAP 285 (H) 10/31/2020   HGBA1C 6.1 (H) 11/09/2020    Review of Glycemic Control  Diabetes history: None Outpatient Diabetes medications: None Current orders for Inpatient glycemic control: Lantus 5 units bid, Novolog 0-15 units tidwc and 0-5 units QHS  HgbA1C of 6.1% indicates pre-diabetes. Hyperglycemia likely from steroids.  Inpatient Diabetes Program Recommendations:     Increase Lantus to 8 units bid Add Novolog 4 units tidwc for meal coverage insulin if pt eats > 50% meal.  Follow glucose trends while on steroids.  Thank you. Lorenda Peck, RD, LDN, CDE Inpatient Diabetes Coordinator [phone]

## 2020-10-31 NOTE — Consult Note (Signed)
[facility] Face-to-Face Psychiatry Consult   Reason for Consult:  Capacity  Referring Physician:  Dr Nevada Crane Patient Identification: Lawrence [name] MRN:  [id number] Principal Diagnosis: Pneumonia due to COVID-19 virus Diagnosis:  Principal Problem:   Pneumonia due to COVID-19 virus Active Problems:   Anxiety state   Atrial fibrillation, chronic (HCC)   Acute respiratory failure with hypoxia (HCC)   Hyperglycemia   Syncope and collapse   Total Time spent with patient: 45 minutes  Subjective:   Lawrence [name] is a 69 y.o. male patient admitted with COVID pneumonia.  HPI:   69 yo male admitted on 12/15 with Covid pneumonia.  He was refusing treatment at times and a capacity consult was placed.  Today he is alert and oriented x4 with his O2 and rebreather in place prior to the assessment.  He states he is feeling better and agreeable to stay until he is stronger to go home where he will receive help from home health nurses.  Discussed what he would do if he started getting short of breath and he acknowledged he would contact people nearby, his physician, family, and 911.  He states he currently compliant with the treatments and has no concerns on assessment.  Considering he has Covid and is 69 years old, he seemed very mobile with no concerns.   HPI per MD: HPI: Lawrence [name] is a 69 y.o. male with medical history significant for aortic insufficiency, aortic stenosis,  A. fib, aortic aneurysm presents to the emergency department due to 2-day onset of shortness of breath.  Patient complained of more than 1 week onset of upper respiratory tract infection symptoms including cough, sore throat, nasal congestion, fever and chills and an episode of nausea and nonbloody vomiting.  These symptoms resolved after about a week, but returned after a few days with same symptoms including productive cough, nasal congestion and general body aches.  Patient states that he was lying on the couch for about 2 days  without being aware of people knocking on his door, neither did he hear his phone ring.  Apparently, friends and family have been trying to reach him during this period, so he thought that he must have been in a "coma".  He notified his sister in [place] and she activated EMS and patient was taken to the ED for further evaluation and management. He states that he tested positive for Covid on 12/5.  Past Psychiatric History: none  Risk to Self:   Risk to Others:   Prior Inpatient Therapy:   Prior Outpatient Therapy:    Past Medical History:  Past Medical History:  Diagnosis Date  . Aortic atherosclerosis ([place])   . Aortic insufficiency   . Aortic stenosis   . Arthritis   . Bicuspid aortic valve    a. mild AS, mild-mod AI by echo 03/2017, 5cm thoracic aortic aneurysm by CT 07/2017.  . Essential hypertension   . GERD (gastroesophageal reflux disease)   . Gout   . Postoperative atrial fibrillation ([place])   . Thoracic aortic aneurysm ([place])    a. 07/2017: 5cm by CT.    Past Surgical History:  Procedure Laterality Date  . ASCENDING AORTIC ROOT REPLACEMENT N/A 07/20/2018   Procedure: REPLACMENT OF ASCENDING AORTA WITH 32MM GRAFT. HYPOTHERMIC CIRCULATORY ARREST. WHEAT PROCEDURE.;  Surgeon: [name], MD;  Location: [place];  Service: Open Heart Surgery;  Laterality: N/A;  . COLONOSCOPY N/A 08/29/2016   Procedure: COLONOSCOPY;  Surgeon: Danie Binder, MD;  [facility];  Service: Endoscopy;  Laterality: N/A;  1030  . COLONOSCOPY N/A 03/09/2018   Procedure: COLONOSCOPY;  Surgeon: Danie Binder, MD;  [facility];  Service: Endoscopy;  Laterality: N/A;  1:15pm  . HERNIA REPAIR     umbilical  . MASS EXCISION N/A 03/18/2018   Procedure: EXCISION CYST ON BACK 2CM;  Surgeon: [name], MD;  Location: [facility];  Service: General;  Laterality: N/A;  . POLYPECTOMY  03/09/2018   Procedure: POLYPECTOMY;  Surgeon: Danie Binder, MD;  [facility];   Service: Endoscopy;;  ascending, transverse x2; descending, sigmoid  . RIGHT/LEFT HEART CATH AND CORONARY ANGIOGRAPHY N/A 09/05/2017   Procedure: RIGHT/LEFT HEART CATH AND CORONARY ANGIOGRAPHY;  Surgeon: Larey Dresser, MD;  Location: [place] [facility];  Service: Cardiovascular;  Laterality: N/A;  . TEE WITHOUT CARDIOVERSION N/A 07/20/2018   Procedure: TRANSESOPHAGEAL ECHOCARDIOGRAM (TEE);  Surgeon: [name], MD;  Location: [place];  Service: Open Heart Surgery;  Laterality: N/A;   Family History:  Family History  Problem Relation Age of Onset  . Colon polyps Sister   . Heart attack Mother   . Stomach cancer Father   . Colon cancer Neg Hx    Family Psychiatric  History: none Social History:  Social History   Substance and Sexual Activity  Alcohol Use Yes   Comment: occ     Social History   Substance and Sexual Activity  Drug Use No    Social History   Socioeconomic History  . Marital status: Widowed    Spouse name: Not on file  . Number of children: Not on file  . Years of education: Not on file  . Highest education level: Not on file  Occupational History  . Not on file  Tobacco Use  . Smoking status: Former Smoker    Packs/day: 2.50    Years: 25.00    Pack years: 62.50    Types: Cigarettes    Quit date: 08/01/1981    Years since quitting: 39.2  . Smokeless tobacco: Never Used  Vaping Use  . Vaping Use: Never used  Substance and Sexual Activity  . Alcohol use: Yes    Comment: occ  . Drug use: No  . Sexual activity: Yes    Birth control/protection: None  Other Topics Concern  . Not on file  Social History Narrative  . Not on file   Social Determinants of Health   Financial Resource Strain: Not on file  Food Insecurity: Not on file  Transportation Needs: Not on file  Physical Activity: Not on file  Stress: Not on file  Social Connections: Not on file   Additional Social History:    Allergies:   Allergies  Allergen Reactions  .  Ciprofloxacin   . Morphine And Related Other (See Comments)    Caused patient to pass out.     Labs:  Results for orders placed or performed during the hospital encounter of 10/24/2020 (from the past 48 hour(s))  Glucose, capillary     Status: Abnormal   Collection Time: 10/29/20  5:09 PM  Result Value Ref Range   Glucose-Capillary 242 (H) 70 - 99 mg/dL    Comment: Glucose reference range applies only to samples taken after fasting for at least 8 hours.   Comment 1 Notify RN    Comment 2 Document in Chart   Glucose, capillary     Status: Abnormal   Collection Time: 10/29/20  8:22  PM  Result Value Ref Range   Glucose-Capillary 253 (H) 70 - 99 mg/dL    Comment: Glucose reference range applies only to samples taken after fasting for at least 8 hours.  CBC with Differential/Platelet     Status: Abnormal   Collection Time: 10/30/20  4:28 AM  Result Value Ref Range   WBC 11.8 (H) 4.0 - 10.5 K/uL   RBC 5.82 (H) 4.22 - 5.81 MIL/uL   Hemoglobin 18.0 (H) 13.0 - 17.0 g/dL   HCT 53.6 (H) 39.0 - 52.0 %   MCV 92.1 80.0 - 100.0 fL   MCH 30.9 26.0 - 34.0 pg   MCHC 33.6 30.0 - 36.0 g/dL   RDW 13.0 11.5 - 15.5 %   Platelets 290 150 - 400 K/uL   nRBC 0.0 0.0 - 0.2 %   Neutrophils Relative % 88 %   Neutro Abs 10.4 (H) 1.7 - 7.7 K/uL   Lymphocytes Relative 5 %   Lymphs Abs 0.6 (L) 0.7 - 4.0 K/uL   Monocytes Relative 6 %   Monocytes Absolute 0.7 0.1 - 1.0 K/uL   Eosinophils Relative 0 %   Eosinophils Absolute 0.0 0.0 - 0.5 K/uL   Basophils Relative 0 %   Basophils Absolute 0.1 0.0 - 0.1 K/uL   Immature Granulocytes 1 %   Abs Immature Granulocytes 0.15 (H) 0.00 - 0.07 K/uL    Comment: Performed at [facility], [place] [address] [place], [place] [address]  C-reactive protein     Status: None   Collection Time: 10/30/20  4:28 AM  Result Value Ref Range   CRP 0.9 <1.0 mg/dL    Comment: Performed at [facility], [place] [address] [place], [place] [address]   D-dimer, quantitative (not at [facility])     Status: Abnormal   Collection Time: 10/30/20  4:28 AM  Result Value Ref Range   D-Dimer, Quant 0.92 (H) 0.00 - 0.50 ug/mL-FEU    Comment: (NOTE) At the manufacturer cut-off value of 0.5 g/mL FEU, this assay has a negative predictive value of 95-100%.This assay is intended for use in conjunction with a clinical pretest probability (PTP) assessment model to exclude pulmonary embolism (PE) and deep venous thrombosis (DVT) in outpatients suspected of PE or DVT. Results should be correlated with clinical presentation. Performed at [facility], [place] [address], [place], [place] [address]   Ferritin     Status: Abnormal   Collection Time: 10/30/20  4:28 AM  Result Value Ref Range   Ferritin 831 (H) 24 - 336 ng/mL    Comment: Performed at [facility], [place] [address] [place], [place] [address]  Magnesium     Status: None   Collection Time: 10/30/20  4:28 AM  Result Value Ref Range   Magnesium 2.3 1.7 - 2.4 mg/dL    Comment: Performed at [facility], [place] [address], [place], [place] [address]  Phosphorus     Status: None   Collection Time: 10/30/20  4:28 AM  Result Value Ref Range   Phosphorus 3.7 2.5 - 4.6 mg/dL    Comment: Performed at [facility], [place] [address], [place],  [address]  Comprehensive metabolic panel     Status: Abnormal   Collection Time: 10/30/20  4:28 AM  Result Value Ref Range   Sodium 142 135 - 145 mmol/L   Potassium 4.7 3.5 - 5.1 mmol/L   Chloride 105 98 - 111 mmol/L   CO2 26  22 - 32 mmol/L   Glucose, Bld 187 (H) 70 - 99 mg/dL    Comment: Glucose reference range applies only to samples taken after fasting for at least 8 hours.   BUN 49 (H) 8 - 23 mg/dL   Creatinine, Ser 0.98 0.61 - 1.24 mg/dL   Calcium 8.9 8.9 - 10.3 mg/dL   Total Protein 6.1 (L) 6.5 - 8.1 g/dL   Albumin 3.1 (L) 3.5 - 5.0 g/dL   AST 32 15 - 41 U/L   ALT 77 (H) 0 - 44  U/L   Alkaline Phosphatase 51 38 - 126 U/L   Total Bilirubin 1.2 0.3 - 1.2 mg/dL   GFR, Estimated >60 >60 mL/min    Comment: (NOTE) Calculated using the CKD-EPI Creatinine Equation (2021)    Anion gap 11 5 - 15    Comment: Performed at [facility], [place] [address], [place], [place] [address]  Glucose, capillary     Status: Abnormal   Collection Time: 10/30/20  8:03 AM  Result Value Ref Range   Glucose-Capillary 187 (H) 70 - 99 mg/dL    Comment: Glucose reference range applies only to samples taken after fasting for at least 8 hours.  Glucose, capillary     Status: Abnormal   Collection Time: 10/30/20 12:02 PM  Result Value Ref Range   Glucose-Capillary 338 (H) 70 - 99 mg/dL    Comment: Glucose reference range applies only to samples taken after fasting for at least 8 hours.  Glucose, capillary     Status: Abnormal   Collection Time: 10/30/20  4:02 PM  Result Value Ref Range   Glucose-Capillary 163 (H) 70 - 99 mg/dL    Comment: Glucose reference range applies only to samples taken after fasting for at least 8 hours.  Glucose, capillary     Status: Abnormal   Collection Time: 10/30/20 10:03 PM  Result Value Ref Range   Glucose-Capillary 261 (H) 70 - 99 mg/dL    Comment: Glucose reference range applies only to samples taken after fasting for at least 8 hours.  Heparin level (unfractionated)     Status: None   Collection Time: 10/31/20  1:42 AM  Result Value Ref Range   Heparin Unfractionated 0.44 0.30 - 0.70 IU/mL    Comment: (NOTE) If heparin results are below expected values, and patient dosage has  been confirmed, suggest follow up testing of antithrombin III levels. Performed at [facility], [place] [address], [place], [place] [address]   Heparin level (unfractionated)     Status: Abnormal   Collection Time: 10/31/20  7:28 AM  Result Value Ref Range   Heparin Unfractionated 0.82 (H) 0.30 - 0.70 IU/mL    Comment: (NOTE) If heparin  results are below expected values, and patient dosage has  been confirmed, suggest follow up testing of antithrombin III levels. Performed at [facility], [place] [address] [place], [place] [address]   CBC with Differential/Platelet     Status: Abnormal   Collection Time: 10/31/20  7:28 AM  Result Value Ref Range   WBC 12.5 (H) 4.0 - 10.5 K/uL   RBC 5.92 (H) 4.22 - 5.81 MIL/uL   Hemoglobin 18.5 (H) 13.0 - 17.0 g/dL   HCT 55.2 (H) 39.0 - 52.0 %   MCV 93.2 80.0 - 100.0 fL   MCH 31.3 26.0 - 34.0 pg   MCHC 33.5 30.0 - 36.0 g/dL   RDW 12.9 11.5 - 15.5 %   Platelets 293  150 - 400 K/uL   nRBC 0.0 0.0 - 0.2 %   Neutrophils Relative % 91 %   Neutro Abs 11.4 (H) 1.7 - 7.7 K/uL   Lymphocytes Relative 5 %   Lymphs Abs 0.7 0.7 - 4.0 K/uL   Monocytes Relative 3 %   Monocytes Absolute 0.3 0.1 - 1.0 K/uL   Eosinophils Relative 0 %   Eosinophils Absolute 0.0 0.0 - 0.5 K/uL   Basophils Relative 0 %   Basophils Absolute 0.0 0.0 - 0.1 K/uL   Immature Granulocytes 1 %   Abs Immature Granulocytes 0.14 (H) 0.00 - 0.07 K/uL    Comment: Performed at [facility], [place] [address] [place], [place] [address]  Comprehensive metabolic panel     Status: Abnormal   Collection Time: 10/31/20  7:28 AM  Result Value Ref Range   Sodium 136 135 - 145 mmol/L   Potassium 4.6 3.5 - 5.1 mmol/L   Chloride 100 98 - 111 mmol/L   CO2 25 22 - 32 mmol/L   Glucose, Bld 314 (H) 70 - 99 mg/dL    Comment: Glucose reference range applies only to samples taken after fasting for at least 8 hours.   BUN 50 (H) 8 - 23 mg/dL   Creatinine, Ser 1.08 0.61 - 1.24 mg/dL   Calcium 8.9 8.9 - 10.3 mg/dL   Total Protein 6.2 (L) 6.5 - 8.1 g/dL   Albumin 3.1 (L) 3.5 - 5.0 g/dL   AST 32 15 - 41 U/L   ALT 70 (H) 0 - 44 U/L   Alkaline Phosphatase 57 38 - 126 U/L   Total Bilirubin 1.2 0.3 - 1.2 mg/dL   GFR, Estimated >60 >60 mL/min    Comment: (NOTE) Calculated using the CKD-EPI Creatinine Equation  (2021)    Anion gap 11 5 - 15    Comment: Performed at [facility], [place] [address] [place], [place] [address]  C-reactive protein     Status: None   Collection Time: 10/31/20  7:28 AM  Result Value Ref Range   CRP 0.7 <1.0 mg/dL    Comment: Performed at [facility], [place] [address] [place], [place] [address]  D-dimer, quantitative (not at [facility])     Status: Abnormal   Collection Time: 10/31/20  7:28 AM  Result Value Ref Range   D-Dimer, Quant 1.34 (H) 0.00 - 0.50 ug/mL-FEU    Comment: (NOTE) At the manufacturer cut-off value of 0.5 g/mL FEU, this assay has a negative predictive value of 95-100%.This assay is intended for use in conjunction with a clinical pretest probability (PTP) assessment model to exclude pulmonary embolism (PE) and deep venous thrombosis (DVT) in outpatients suspected of PE or DVT. Results should be correlated with clinical presentation. Performed at [facility], [place] [address], [place], [place] [address]   Ferritin     Status: Abnormal   Collection Time: 10/31/20  7:28 AM  Result Value Ref Range   Ferritin 871 (H) 24 - 336 ng/mL    Comment: Performed at [facility], [place] [address] [place], [place] [address]  Magnesium     Status: None   Collection Time: 10/31/20  7:28 AM  Result Value Ref Range   Magnesium 2.4 1.7 - 2.4 mg/dL    Comment: Performed at [facility], [place] [address], [place], [place] [address]  Phosphorus     Status: None   Collection Time: 10/31/20  7:28 AM  Result  Value Ref Range   Phosphorus 4.0 2.5 - 4.6 mg/dL    Comment: Performed at [facility], [place] [address], [place], [place] [address]  Glucose, capillary     Status: Abnormal   Collection Time: 10/31/20  8:05 AM  Result Value Ref Range   Glucose-Capillary 285 (H) 70 - 99 mg/dL    Comment: Glucose reference range applies only to samples taken after fasting for at  least 8 hours.  Glucose, capillary     Status: Abnormal   Collection Time: 10/31/20  1:29 PM  Result Value Ref Range   Glucose-Capillary 283 (H) 70 - 99 mg/dL    Comment: Glucose reference range applies only to samples taken after fasting for at least 8 hours.    Current Facility-Administered Medications  Medication Dose Route Frequency Provider Last Rate Last Admin  . acetaminophen (TYLENOL) tablet 650 mg  650 mg Oral Q6H PRN Lorella Nimrod, MD   650 mg at 10/30/20 2133  . albuterol (VENTOLIN HFA) 108 (90 Base) MCG/ACT inhaler 2 puff  2 puff Inhalation TID Irene Pap N, DO   2 puff at 10/31/20 0807  . ascorbic acid (VITAMIN C) tablet 500 mg  500 mg Oral BID Lorella Nimrod, MD   500 mg at 10/31/20 0806  . atorvastatin (LIPITOR) tablet 20 mg  20 mg Oral Daily Lorella Nimrod, MD   20 mg at 10/31/20 0806  . cholecalciferol (VITAMIN D3) tablet 1,000 Units  1,000 Units Oral Daily Kayleen Memos, DO   1,000 Units at 10/31/20 5053  . dextromethorphan-guaiFENesin (MUCINEX DM) 30-600 MG per 12 hr tablet 1 tablet  1 tablet Oral BID Lorella Nimrod, MD   1 tablet at 10/31/20 0807  . diltiazem (CARDIZEM CD) 24 hr capsule 120 mg  120 mg Oral Daily Lorella Nimrod, MD   120 mg at 10/31/20 0807  . enoxaparin (LOVENOX) injection 40 mg  40 mg Subcutaneous Q24H Hall, Carole N, DO      . guaiFENesin-dextromethorphan (ROBITUSSIN DM) 100-10 MG/5ML syrup 10 mL  10 mL Oral Q4H PRN Lorella Nimrod, MD   10 mL at 10/29/20 1729  . insulin aspart (novoLOG) injection 0-15 Units  0-15 Units Subcutaneous TID WC Lorella Nimrod, MD   8 Units at 10/31/20 [phone]  . insulin aspart (novoLOG) injection 0-5 Units  0-5 Units Subcutaneous QHS Lorella Nimrod, MD   3 Units at 10/30/20 2247  . insulin glargine (LANTUS) injection 5 Units  5 Units Subcutaneous BID Kayleen Memos, DO   5 Units at 10/31/20 3419  . methylPREDNISolone sodium succinate (SOLU-MEDROL) 125 mg/2 mL injection 60 mg  60 mg Intravenous Q12H Lorella Nimrod, MD   60 mg at 10/31/20  0459  . mupirocin ointment (BACTROBAN) 2 % 1 application  1 application Nasal BID Lorella Nimrod, MD   1 application at 37/90/24 2133  . ondansetron (ZOFRAN) tablet 4 mg  4 mg Oral Q6H PRN Lorella Nimrod, MD       Or  . ondansetron (ZOFRAN) injection 4 mg  4 mg Intravenous Q6H PRN Lorella Nimrod, MD      . pantoprazole (PROTONIX) EC tablet 40 mg  40 mg Oral BID AC Tanda Rockers, MD   40 mg at 10/31/20 0807  . sodium chloride (OCEAN) 0.65 % nasal spray 1 spray  1 spray Each Nare PRN Irene Pap N, DO   1 spray at 10/30/20 2134  . zinc sulfate capsule 220 mg  220 mg Oral Daily Lorella Nimrod, MD  220 mg at 10/31/20 0807    Musculoskeletal: Strength & Muscle Tone: within normal limits Gait & Station: did not witness Patient leans: N/A  Psychiatric Specialty Exam: Physical Exam Vitals and nursing note reviewed.  Constitutional:      Appearance: Normal appearance.  HENT:     Head: Normocephalic.     Nose: Nose normal.  Pulmonary:     Effort: Pulmonary effort is normal.  Musculoskeletal:        General: Normal range of motion.     Cervical back: Normal range of motion.  Neurological:     General: No focal deficit present.     Mental Status: He is alert and oriented to person, place, and time.  Psychiatric:        Attention and Perception: Attention and perception normal.        Mood and Affect: Mood is anxious.        Speech: Speech normal.        Behavior: Behavior normal. Behavior is cooperative.        Thought Content: Thought content normal.        Cognition and Memory: Cognition and memory normal.        Judgment: Judgment normal.     Review of Systems  Psychiatric/Behavioral: The patient is nervous/anxious.   All other systems reviewed and are negative.   Blood pressure (!) 145/91, pulse 66, temperature 97.7 F (36.5 C), resp. rate (!) 23, height 5\' 11"  (1.803 m), weight 97.1 kg, SpO2 92 %.Body mass index is 29.85 kg/m.  General Appearance: Casual  Eye Contact:  Good   Speech:  Normal Rate  Volume:  Normal  Mood:  Anxious  Affect:  Congruent  Thought Process:  Coherent and Descriptions of Associations: Intact  Orientation:  Full (Time, Place, and Person)  Thought Content:  WDL and Logical  Suicidal Thoughts:  No  Homicidal Thoughts:  No  Memory:  Immediate;   Good Recent;   Good Remote;   Good  Judgement:  Fair  Insight:  Fair  Psychomotor Activity:  Normal  Concentration:  Concentration: Good and Attention Span: Good  Recall:  Good  Fund of Knowledge:  Good  Language:  Good  Akathisia:  No  Handed:  Right  AIMS (if indicated):     Assets:  Housing Leisure Time Resilience Social Support  ADL's:  Intact  Cognition:  WNL  Sleep:        Treatment Plan Summary: Anxiety State: Continue to wear oxygen to prevent SOB that contributes to anxiety : Capacity: Patient has capacity to make independent medical decisions.  Alert and oriented times 4, wearing his oxygen and wants to stay in the hospital until he is well enough to return home  Disposition: No evidence of imminent risk to self or others at present.   Patient does not meet criteria for psychiatric inpatient admission.  Waylan Boga, NP 10/31/2020 2:42 PM

## 2020-10-31 NOTE — Progress Notes (Signed)
Notified RT that pt's 02 sat on 60L heated HF has been fluctuating between 92%-100% for the past 2 hours.

## 2020-10-31 NOTE — Evaluation (Signed)
Physical Therapy Evaluation Patient Details Name: Lawrence [name] MRN: [id number] DOB: 09/19/1951 Today's Date: 10/31/2020   History of Present Illness  69 yo male admitted with COVID Pna, possible PE. Hx of aortic stenosis, Afib, AA.  Clinical Impression  Bed level eval only on today. Pt politely declined OOB activity. He allowed me to assess his UE and LE strength which is all WNL. O2 sats dropped to 85% with that bed level assessment. Pt sat EOB earlier today with OT and sats dropped quite a bit. Pt is currently on 60L HHFNC + O2 mask. He is willing to work with PT another day. Will plan to follow and progress activity as tolerated.     Follow Up Recommendations Home health PT;SNF (depending on progress)    Equipment Recommendations  None recommended by PT    Recommendations for Other Services       Precautions / Restrictions Precautions Precautions: Fall Precaution Comments: airborne; HHFNC + O2 mask Restrictions Weight Bearing Restrictions: No      Mobility  Bed Mobility Overal bed mobility: Needs Assistance Bed Mobility: Supine to Sit;Sit to Supine         General bed mobility comments: Deferred. Pt politley declined at this time.    Transfers                 General transfer comment:   Ambulation/Gait                Stairs            Wheelchair Mobility    Modified Rankin (Stroke Patients Only)       Balance Overall balance assessment: Needs assistance Sitting-balance support: Feet supported Sitting balance-Leahy Scale: Fair                                       Pertinent Vitals/Pain Pain Assessment: No/denies pain Faces Pain Scale: No hurt    Home Living Family/patient expects to be discharged to:: Private residence Living Arrangements: Alone Available Help at Discharge: Family;Available PRN/intermittently Type of Home: House Home Access: Stairs to enter   Entrance Stairs-Number of Steps: 1 Home  Layout: One level Home Equipment: Cane - single point;Walker - 2 wheels Additional Comments: neighbor is getting him a shower chair    Prior Function Level of Independence: Independent               Hand Dominance   Dominant Hand: Right    Extremity/Trunk Assessment   Upper Extremity Assessment Upper Extremity Assessment: Defer to OT evaluation    Lower Extremity Assessment Lower Extremity Assessment: Generalized weakness    Cervical / Trunk Assessment Cervical / Trunk Assessment: Normal  Communication   Communication: No difficulties  Cognition Arousal/Alertness: Awake/alert Behavior During Therapy: WFL for tasks assessed/performed Overall Cognitive Status: Within Functional Limits for tasks assessed                                        General Comments      Exercises General Exercises - Upper Extremity Shoulder Flexion: AROM;Both;5 reps;Supine General Exercises - Lower Extremity Ankle Circles/Pumps: AROM;Both;5 reps;Supine Quad Sets: Strengthening;Both;5 reps;Supine Gluteal Sets: Strengthening;Both;5 reps;Supine   Assessment/Plan    PT Assessment Patient needs continued PT services  PT Problem List Decreased strength;Decreased mobility;Decreased activity tolerance;Cardiopulmonary status limiting activity  PT Treatment Interventions DME instruction;Gait training;Therapeutic activities;Therapeutic exercise;Patient/family education;Balance training;Functional mobility training    PT Goals (Current goals can be found in the Care Plan section)  Acute Rehab PT Goals Patient Stated Goal: home by christmas PT Goal Formulation: With patient Time For Goal Achievement: 11/14/20 Potential to Achieve Goals: Fair    Frequency Min 3X/week   Barriers to discharge        Co-evaluation               AM-PAC PT "6 Clicks" Mobility  Outcome Measure Help needed turning from your back to your side while in a flat bed without using  bedrails?: A Little Help needed moving from lying on your back to sitting on the side of a flat bed without using bedrails?: A Little Help needed moving to and from a bed to a chair (including a wheelchair)?: A Little Help needed standing up from a chair using your arms (e.g., wheelchair or bedside chair)?: A Little Help needed to walk in hospital room?: A Lot Help needed climbing 3-5 steps with a railing? : A Lot 6 Click Score: 16    End of Session Equipment Utilized During Treatment: Oxygen Activity Tolerance: Patient limited by fatigue (Limited by O2) Patient left: in bed;with call bell/phone within reach   PT Visit Diagnosis: Muscle weakness (generalized) (M62.81);Difficulty in walking, not elsewhere classified (R26.2)    Time: 6063-0160 PT Time Calculation (min) (ACUTE ONLY): 8 min   Charges:   PT Evaluation $PT Eval Moderate Complexity: 1 Mod            Doreatha Massed, PT Acute Rehabilitation  Office: [phone] Pager: [phone]

## 2020-11-01 LAB — CBC WITH DIFFERENTIAL/PLATELET
Abs Immature Granulocytes: 0.23 10*3/uL — ABNORMAL HIGH (ref 0.00–0.07)
Basophils Absolute: 0.1 10*3/uL (ref 0.0–0.1)
Basophils Relative: 0 %
Eosinophils Absolute: 0 10*3/uL (ref 0.0–0.5)
Eosinophils Relative: 0 %
HCT: 54.2 % — ABNORMAL HIGH (ref 39.0–52.0)
Hemoglobin: 18.4 g/dL — ABNORMAL HIGH (ref 13.0–17.0)
Immature Granulocytes: 1 %
Lymphocytes Relative: 4 %
Lymphs Abs: 0.7 10*3/uL (ref 0.7–4.0)
MCH: 30.9 pg (ref 26.0–34.0)
MCHC: 33.9 g/dL (ref 30.0–36.0)
MCV: 91.1 fL (ref 80.0–100.0)
Monocytes Absolute: 0.6 10*3/uL (ref 0.1–1.0)
Monocytes Relative: 3 %
Neutro Abs: 14.9 10*3/uL — ABNORMAL HIGH (ref 1.7–7.7)
Neutrophils Relative %: 92 %
Platelets: 290 10*3/uL (ref 150–400)
RBC: 5.95 MIL/uL — ABNORMAL HIGH (ref 4.22–5.81)
RDW: 12.9 % (ref 11.5–15.5)
WBC: 16.4 10*3/uL — ABNORMAL HIGH (ref 4.0–10.5)
nRBC: 0 % (ref 0.0–0.2)

## 2020-11-01 LAB — GLUCOSE, CAPILLARY
Glucose-Capillary: 258 mg/dL — ABNORMAL HIGH (ref 70–99)
Glucose-Capillary: 240 mg/dL — ABNORMAL HIGH (ref 70–99)
Glucose-Capillary: 149 mg/dL — ABNORMAL HIGH (ref 70–99)
Glucose-Capillary: 179 mg/dL — ABNORMAL HIGH (ref 70–99)

## 2020-11-01 LAB — COMPREHENSIVE METABOLIC PANEL
ALT: 58 U/L — ABNORMAL HIGH (ref 0–44)
AST: 25 U/L (ref 15–41)
Albumin: 3.1 g/dL — ABNORMAL LOW (ref 3.5–5.0)
Alkaline Phosphatase: 58 U/L (ref 38–126)
Anion gap: 9 (ref 5–15)
BUN: 49 mg/dL — ABNORMAL HIGH (ref 8–23)
CO2: 27 mmol/L (ref 22–32)
Calcium: 9 mg/dL (ref 8.9–10.3)
Chloride: 101 mmol/L (ref 98–111)
Creatinine, Ser: 1.14 mg/dL (ref 0.61–1.24)
GFR, Estimated: >60 mL/min (ref >60)
Glucose, Bld: 240 mg/dL — ABNORMAL HIGH (ref 70–99)
Potassium: 4.3 mmol/L (ref 3.5–5.1)
Sodium: 137 mmol/L (ref 135–145)
Total Bilirubin: 1.3 mg/dL — ABNORMAL HIGH (ref 0.3–1.2)
Total Protein: 6.2 g/dL — ABNORMAL LOW (ref 6.5–8.1)

## 2020-11-01 LAB — D-DIMER, QUANTITATIVE: D-Dimer, Quant: 2.42 ug/mL-FEU — ABNORMAL HIGH (ref 0.00–0.50)

## 2020-11-01 LAB — PHOSPHORUS: Phosphorus: 4.3 mg/dL (ref 2.5–4.6)

## 2020-11-01 LAB — FERRITIN: Ferritin: 831 ng/mL — ABNORMAL HIGH (ref 24–336)

## 2020-11-01 LAB — MAGNESIUM: Magnesium: 2.3 mg/dL (ref 1.7–2.4)

## 2020-11-01 LAB — C-REACTIVE PROTEIN: CRP: 0.6 mg/dL (ref <1.0)

## 2020-11-01 MED ORDER — ALBUTEROL SULFATE HFA 108 (90 BASE) MCG/ACT IN AERS
2.0000 | INHALATION_SPRAY | Freq: Four times a day (QID) | RESPIRATORY_TRACT | Status: DC
  Administered 2020-11-01 – 2020-11-04 (×13): 2 via RESPIRATORY_TRACT

## 2020-11-01 NOTE — Progress Notes (Signed)
Occupational Therapy Treatment Patient Details Name: Lawrence [name] MRN: [id number] DOB: Nov 17, 1950 Today's Date: 11/01/2020    History of present illness 69 yo male admitted with COVID Pna, possible PE. Hx of aortic stenosis, Afib, AA.   OT comments  Treatment focused on promoting activity to improve strength and endurance. Patient self limiting today. Significant amount of time on education in regards to mobilizing, positioning, use of breathing devices and techniques. Patient verbalizes understanding but not agreeable to stay out of bed.     Follow Up Recommendations  Home health OT;Other (comment) (vs SNF pending progress)    Equipment Recommendations  Tub/shower seat    Recommendations for Other Services      Precautions / Restrictions Precautions Precautions: Fall Precaution Comments: airborne; HHFNC + O2 mask Restrictions Weight Bearing Restrictions: No       Mobility Bed Mobility Overal bed mobility: Needs Assistance Bed Mobility: Supine to Sit;Sit to Supine     Supine to sit: Min guard;Supervision Sit to supine: Min guard;Supervision   General bed mobility comments: min guard/supervision to mobilize to EOB and assist for line management. pt returned to supine after c/o tiredness and SOB and despite education on benefits of mobilizing with rest breaks pt declined to sit up or move to chair.  Transfers Overall transfer level: Needs assistance Equipment used: Rolling walker (2 wheeled) Transfers: Sit to/from Stand Sit to Stand: Min guard;Min assist         General transfer comment: close guard/assist for safety and to complete power up from EOB. pt desat to low/mid 80's and HR in 100's with activity. pt c/o SOB and sat EOB for several minutes. SpO2 improved to 90's after 2 minutes sitting.    Balance Overall balance assessment: Mild deficits observed, not formally tested Sitting-balance support: Feet supported Sitting balance-Leahy Scale: Fair      Standing balance support: During functional activity;Bilateral upper extremity supported Standing balance-Leahy Scale: Fair                             ADL either performed or assessed with clinical judgement   ADL                                               Vision   Vision Assessment?: No apparent visual deficits   Perception     Praxis      Cognition Arousal/Alertness: Awake/alert Behavior During Therapy: WFL for tasks assessed/performed Overall Cognitive Status: Within Functional Limits for tasks assessed                                          Exercises     Shoulder Instructions       General Comments educated on use of IS and flutter valve for breathing exercises. educated on sitting upright; bed left in chair position.    Pertinent Vitals/ Pain       Pain Assessment: No/denies pain  Home Living Family/patient expects to be discharged to:: Private residence                                        Prior  Functioning/Environment              Frequency  Min 2X/week        Progress Toward Goals  OT Goals(current goals can now be found in the care plan section)  Progress towards OT goals: Progressing toward goals  Acute Rehab OT Goals Patient Stated Goal: home by christmas OT Goal Formulation: With patient Time For Goal Achievement: 11/14/20 Potential to Achieve Goals: Good  Plan Discharge plan remains appropriate    Co-evaluation    PT/OT/SLP Co-Evaluation/Treatment: Yes Reason for Co-Treatment: To address functional/ADL transfers PT goals addressed during session: Mobility/safety with mobility OT goals addressed during session:  (education)      AM-PAC OT "6 Clicks" Daily Activity     Outcome Measure   Help from another person eating meals?: None Help from another person taking care of personal grooming?: A Little Help from another person toileting, which includes  using toliet, bedpan, or urinal?: A Lot Help from another person bathing (including washing, rinsing, drying)?: A Lot Help from another person to put on and taking off regular upper body clothing?: A Little Help from another person to put on and taking off regular lower body clothing?: A Lot 6 Click Score: 16    End of Session Equipment Utilized During Treatment: Oxygen  OT Visit Diagnosis: Other abnormalities of gait and mobility (R26.89);Muscle weakness (generalized) (M62.81)   Activity Tolerance Patient limited by fatigue   Patient Left in bed;with call bell/phone within reach;with bed alarm set   Nurse Communication Mobility status;Other (comment)        [id number] OT Time Calculation (min): 27 min  Charges: OT General Charges $OT Visit: 1 Visit OT Treatments $Therapeutic Activity: 8-22 mins  Derl Barrow, OTR/L [place]  Office [phone] Pager: [place] 11/01/2020, 2:12 PM

## 2020-11-01 NOTE — Progress Notes (Signed)
[facility] Progress Note  Patient: Lawrence [name]    [id number]  DOA: 10/17/2020     Date of Service: the patient was seen and examined on 11/01/2020  Brief hospital course: Paroxysmal A. fib, aneurysm.  Presented with complaints of COVID-19 pneumonia and hypoxia. Currently plan is continue supportive care.  Assessment and Plan: 1.  Acute hypoxic respiratory failure, POA Acute COVID-19 Viral Pneumonia CXR: hazy bilateral peripheral opacities Oxygen requirement: On heated high flow at 100% at 55 L/min.  Able to come off of the NRB.  Saturation 90% CRP: 0.6 now Remdesivir: Completed course Steroids: 60 mg twice daily Solu-Medrol, day 8 Baricitinib/Actemra(off-label use): Not indicated for now The investigational nature of this medication was discussed with the patient/HCPOA and they choose to proceed as the potential benefits are felt to outweigh risks at this time.  Antibiotics: None Vitamin C and Zinc: Continue DVT Prophylaxis: enoxaparin (LOVENOX) injection 40 mg Start: 10/31/20 2200 SCDs Start: 10/30/2020 2204 Prone positioning and incentive spirometer use recommended.  The treatment plan and use of medications and known side effects were discussed with patient/family. It was clearly explained that Complete risks and long-term side effects are unknown, however in the best clinical judgment they seem to be of some clinical benefit rather than medical risks. Patient/family agree with the treatment plan and want to receive these treatments as indicated.   2.  QT prolonged duration Monitor.  Currently improving.  3.  Capacity evaluation Psychiatry consulted. Patient does have capacity to make medical decisions.  4.  D-dimer elevation Patient was on therapeutic anticoagulation. Lower extremity Doppler negative. PCCM was consulted and recommended to discontinue anticoagulation if the lower extremity Doppler is is negative.  Back to DVT prophylaxis.  5.  Chronic A.  fib On Cardizem. Not on anticoagulation prior to admission. Currently does not want to go on anticoagulation.  Monitor.  6.  HLD Continue statin  Diet: Cardiac diet DVT Prophylaxis:   enoxaparin (LOVENOX) injection 40 mg Start: 10/31/20 2200 SCDs Start: 11/08/2020 2204    Advance goals of care discussion: Limited code  Family Communication: no family was present at bedside, at the time of interview.   Disposition:  Status is: Inpatient  Remains inpatient appropriate because:Inpatient level of care appropriate due to severity of illness   Dispo:  Patient From: Home  Planned Disposition: Home  Expected discharge date: 11/07/2020  Medically stable for discharge: No  Subjective: Continues to have shortness of breath no nausea no vomiting but no fever no chills.  No chest pain as well.  No hemoptysis.  No blood in the stool.  Physical Exam:  General: Appear in moderate distress, no Rash; Oral Mucosa Clear, moist. no Abnormal Neck Mass Or lumps, Conjunctiva normal  Cardiovascular: S1 and S2 Present, no Murmur, Respiratory: increased respiratory effort, Bilateral Air entry present and bilateral  Crackles, no wheezes Abdomen: Bowel Sound present, Soft and no tenderness Extremities: no Pedal edema Neurology: alert and oriented to time, place, and person affect anxious. no new focal deficit Gait not checked due to patient safety concerns  Vitals:   11/01/20 1110 11/01/20 1230 11/01/20 1231 11/01/20 1357  BP:  (!) 139/95 (!) 139/95   Pulse: 65 81    Resp:  (!) 22    Temp:  98.9 F (37.2 C) 98.9 F (37.2 C)   TempSrc:  Oral Oral   SpO2: 93%   (!) 89%  Weight:      Height:        Intake/Output Summary (  Last 24 hours) at 11/01/2020 1948 Last data filed at 11/01/2020 0600 Gross per 24 hour  Intake --  Output 800 ml  Net -800 ml   Filed Weights   2020-10-31 1322  Weight: 97.1 kg    Data Reviewed: I have personally reviewed and interpreted daily labs, tele strips,  imagings as discussed above. I reviewed all nursing notes, pharmacy notes, vitals, pertinent old records I have discussed plan of care as described above with RN and patient/family.  CBC: Recent Labs  Lab 10/28/20 0412 10/29/20 0350 10/30/20 0428 10/31/20 0728 11/01/20 0829  WBC 13.1* 11.2* 11.8* 12.5* 16.4*  NEUTROABS 11.4* 9.9* 10.4* 11.4* 14.9*  HGB 16.8 17.2* 18.0* 18.5* 18.4*  HCT 50.0 51.1 53.6* 55.2* 54.2*  MCV 91.4 91.9 92.1 93.2 91.1  PLT 243 259 290 293 619   Basic Metabolic Panel: Recent Labs  Lab 10/28/20 0412 10/29/20 0350 10/30/20 0428 10/31/20 0728 11/01/20 0829  NA 141 140 142 136 137  K 4.5 4.8 4.7 4.6 4.3  CL 101 102 105 100 101  CO2 27 27 26 25 27   GLUCOSE 191* 182* 187* 314* 240*  BUN 47* 48* 49* 50* 49*  CREATININE 0.91 1.01 0.98 1.08 1.14  CALCIUM 9.1 9.1 8.9 8.9 9.0  MG 2.3 2.4 2.3 2.4 2.3  PHOS 4.0 3.7 3.7 4.0 4.3    Studies: No results found.  Scheduled Meds: . albuterol  2 puff Inhalation Q6H WA  . vitamin C  500 mg Oral BID  . atorvastatin  20 mg Oral Daily  . cholecalciferol  1,000 Units Oral Daily  . dextromethorphan-guaiFENesin  1 tablet Oral BID  . diltiazem  120 mg Oral Daily  . enoxaparin (LOVENOX) injection  40 mg Subcutaneous Q24H  . insulin aspart  0-15 Units Subcutaneous TID WC  . insulin aspart  0-5 Units Subcutaneous QHS  . insulin glargine  5 Units Subcutaneous BID  . methylPREDNISolone (SOLU-MEDROL) injection  60 mg Intravenous Q12H  . pantoprazole  40 mg Oral BID AC  . zinc sulfate  220 mg Oral Daily   Continuous Infusions: PRN Meds: acetaminophen, guaiFENesin-dextromethorphan, ondansetron **OR** ondansetron (ZOFRAN) IV, sodium chloride  Time spent: 35 minutes  Author: Berle Mull, MD [facility] 11/01/2020 7:48 PM  To reach On-call, see care teams to locate the attending and reach out via [email or web address]. Between 7PM-7AM, please contact night-coverage If you still have difficulty reaching the attending  provider, please page the [facility] (Director on Call) for [facility] on amion for assistance.

## 2020-11-01 NOTE — Progress Notes (Signed)
Physical Therapy Treatment Patient Details Name: Lawrence [name] MRN: [id number] DOB: Oct 24, 1951 Today's Date: 11/01/2020    History of Present Illness 69 yo male admitted with COVID Pna, possible PE. Hx of aortic stenosis, Afib, AA.    PT Comments    Patient making some progress with acute therapy and was able to mobilize to EOB and take several steps along EOB with RW. Pt on 50L at 100% FiO2 on HHFNC + 15L/min NRB and desat to low 80's with mobility. Pt took ~ 2 minute to recover to low 90's for SpO2 with seated rest. Pt tending to pull HHFNC line out of nose and cues/education required to keep O2 in place. Patient declined to sit up in recliner despite education and bed adjusted to chair position to increase pt's upright posture. Educated on useo f IS and flutter valve for breathing exercises.    Follow Up Recommendations  Home health PT;SNF (pending progress)     Equipment Recommendations  None recommended by PT    Recommendations for Other Services       Precautions / Restrictions Precautions Precautions: Fall Precaution Comments: airborne; HHFNC + O2 mask Restrictions Weight Bearing Restrictions: No    Mobility  Bed Mobility Overal bed mobility: Needs Assistance Bed Mobility: Supine to Sit;Sit to Supine     Supine to sit: Min guard;Supervision Sit to supine: Min guard;Supervision   General bed mobility comments: min guard/supervision to mobilize to EOB and assist for line management. pt returned to supine after c/o tiredness and SOB and despite education on benefits of mobilizing with rest breaks pt declined to sit up or move to chair.  Transfers Overall transfer level: Needs assistance Equipment used: Rolling walker (2 wheeled) Transfers: Sit to/from Stand Sit to Stand: Min guard;Min assist         General transfer comment: close guard/assist for safety and to complete power up from EOB. pt desat to low/mid 80's and HR in 100's with activity. pt c/o SOB and  sat EOB for several minutes. SpO2 improved to 90's after 2 minutes sitting.  Ambulation/Gait Ambulation/Gait assistance: Min guard Gait Distance (Feet): 2 Feet Assistive device: Rolling walker (2 wheeled)   Gait velocity: decr   General Gait Details: pt took several side steps with RW at EOB to move toward [facility] due to short O2 line.   Stairs             Wheelchair Mobility    Modified Rankin (Stroke Patients Only)       Balance Overall balance assessment: Needs assistance Sitting-balance support: Feet supported Sitting balance-Leahy Scale: Fair     Standing balance support: During functional activity;Bilateral upper extremity supported Standing balance-Leahy Scale: Fair                              Cognition Arousal/Alertness: Awake/alert Behavior During Therapy: WFL for tasks assessed/performed Overall Cognitive Status: Within Functional Limits for tasks assessed                                        Exercises      General Comments General comments (skin integrity, edema, etc.): educated on use of IS and flutter valve for breathing exercises. educated on sitting upright; bed left in chair position.      Pertinent Vitals/Pain Pain Assessment: No/denies pain    Home Living Family/patient  expects to be discharged to:: Private residence                    Prior Function            PT Goals (current goals can now be found in the care plan section) Acute Rehab PT Goals Patient Stated Goal: home by christmas PT Goal Formulation: With patient Time For Goal Achievement: 11/14/20 Potential to Achieve Goals: Fair Progress towards PT goals: Progressing toward goals    Frequency    Min 3X/week      PT Plan Current plan remains appropriate    Co-evaluation PT/OT/SLP Co-Evaluation/Treatment: Yes Reason for Co-Treatment: To address functional/ADL transfers;Other (comment) (pt fatigue level) PT goals addressed  during session: Mobility/safety with mobility;Proper use of DME;Balance        AM-PAC PT "6 Clicks" Mobility   Outcome Measure  Help needed turning from your back to your side while in a flat bed without using bedrails?: A Little Help needed moving from lying on your back to sitting on the side of a flat bed without using bedrails?: A Little Help needed moving to and from a bed to a chair (including a wheelchair)?: A Little Help needed standing up from a chair using your arms (e.g., wheelchair or bedside chair)?: A Little Help needed to walk in hospital room?: A Little Help needed climbing 3-5 steps with a railing? : A Lot 6 Click Score: 17    End of Session Equipment Utilized During Treatment: Oxygen Activity Tolerance: Patient tolerated treatment well Patient left: in bed;with call bell/phone within reach;with bed alarm set (chair position) Nurse Communication: Mobility status PT Visit Diagnosis: Muscle weakness (generalized) (M62.81);Difficulty in walking, not elsewhere classified (R26.2)     Time: 3785-8850 PT Time Calculation (min) (ACUTE ONLY): 28 min  Charges:  $Therapeutic Activity: 8-22 mins                     Verner Mould, DPT Acute Rehabilitation Services Office [phone] Pager [phone]     Jacques Navy 11/01/2020, 1:10 PM

## 2020-11-02 ENCOUNTER — Inpatient Hospital Stay (HOSPITAL_COMMUNITY): Payer: Medicare HMO

## 2020-11-02 LAB — CBC WITH DIFFERENTIAL/PLATELET
Abs Immature Granulocytes: 0.21 10*3/uL — ABNORMAL HIGH (ref 0.00–0.07)
Basophils Absolute: 0.1 10*3/uL (ref 0.0–0.1)
Basophils Relative: 0 %
Eosinophils Absolute: 0 10*3/uL (ref 0.0–0.5)
Eosinophils Relative: 0 %
HCT: 51.3 % (ref 39.0–52.0)
Hemoglobin: 17.5 g/dL — ABNORMAL HIGH (ref 13.0–17.0)
Immature Granulocytes: 1 %
Lymphocytes Relative: 3 %
Lymphs Abs: 0.5 10*3/uL — ABNORMAL LOW (ref 0.7–4.0)
MCH: 30.8 pg (ref 26.0–34.0)
MCHC: 34.1 g/dL (ref 30.0–36.0)
MCV: 90.2 fL (ref 80.0–100.0)
Monocytes Absolute: 0.7 10*3/uL (ref 0.1–1.0)
Monocytes Relative: 5 %
Neutro Abs: 13.4 10*3/uL — ABNORMAL HIGH (ref 1.7–7.7)
Neutrophils Relative %: 91 %
Platelets: 265 10*3/uL (ref 150–400)
RBC: 5.69 MIL/uL (ref 4.22–5.81)
RDW: 13 % (ref 11.5–15.5)
WBC: 14.8 10*3/uL — ABNORMAL HIGH (ref 4.0–10.5)
nRBC: 0 % (ref 0.0–0.2)

## 2020-11-02 LAB — COMPREHENSIVE METABOLIC PANEL
ALT: 53 U/L — ABNORMAL HIGH (ref 0–44)
AST: 24 U/L (ref 15–41)
Albumin: 2.9 g/dL — ABNORMAL LOW (ref 3.5–5.0)
Alkaline Phosphatase: 58 U/L (ref 38–126)
Anion gap: 9 (ref 5–15)
BUN: 60 mg/dL — ABNORMAL HIGH (ref 8–23)
CO2: 24 mmol/L (ref 22–32)
Calcium: 8.7 mg/dL — ABNORMAL LOW (ref 8.9–10.3)
Chloride: 103 mmol/L (ref 98–111)
Creatinine, Ser: 1.13 mg/dL (ref 0.61–1.24)
GFR, Estimated: >60 mL/min (ref >60)
Glucose, Bld: 308 mg/dL — ABNORMAL HIGH (ref 70–99)
Potassium: 4.7 mmol/L (ref 3.5–5.1)
Sodium: 136 mmol/L (ref 135–145)
Total Bilirubin: 1.1 mg/dL (ref 0.3–1.2)
Total Protein: 5.7 g/dL — ABNORMAL LOW (ref 6.5–8.1)

## 2020-11-02 LAB — GLUCOSE, CAPILLARY
Glucose-Capillary: 244 mg/dL — ABNORMAL HIGH (ref 70–99)
Glucose-Capillary: 315 mg/dL — ABNORMAL HIGH (ref 70–99)
Glucose-Capillary: 158 mg/dL — ABNORMAL HIGH (ref 70–99)
Glucose-Capillary: 162 mg/dL — ABNORMAL HIGH (ref 70–99)

## 2020-11-02 LAB — D-DIMER, QUANTITATIVE: D-Dimer, Quant: 2.07 ug/mL-FEU — ABNORMAL HIGH (ref 0.00–0.50)

## 2020-11-02 LAB — C-REACTIVE PROTEIN: CRP: 0.6 mg/dL (ref <1.0)

## 2020-11-02 LAB — FERRITIN: Ferritin: 859 ng/mL — ABNORMAL HIGH (ref 24–336)

## 2020-11-02 LAB — PHOSPHORUS: Phosphorus: 4.4 mg/dL (ref 2.5–4.6)

## 2020-11-02 LAB — MAGNESIUM: Magnesium: 2.5 mg/dL — ABNORMAL HIGH (ref 1.7–2.4)

## 2020-11-02 MED ORDER — FUROSEMIDE 10 MG/ML IJ SOLN
20.0000 mg | Freq: Once | INTRAMUSCULAR | Status: AC
  Administered 2020-11-02: 23:00:00 20 mg via INTRAVENOUS
  Filled 2020-11-02: qty 2

## 2020-11-02 NOTE — Care Management Important Message (Signed)
Important Message  Patient Details IM Letter given to the Patient. Name: Lawrence [name] MRN: [id number] Date of Birth: 08-Nov-1951   Medicare Important Message Given:  Yes     [name] 11/02/2020, 12:21 PM

## 2020-11-02 NOTE — Progress Notes (Signed)
Inpatient Diabetes Program Recommendations  AACE/ADA: New Consensus Statement on Inpatient Glycemic Control (2015)  Target Ranges:  Prepandial:   less than 140 mg/dL      Peak postprandial:   less than 180 mg/dL (1-2 hours)      Critically ill patients:  140 - 180 mg/dL   Lab Results  Component Value Date   GLUCAP 244 (H) 11/02/2020   HGBA1C 6.1 (H) 11/08/2020    Review of Glycemic Control Results for Lawrence [name], Lawrence [name] (MRN [id number]) as of 11/02/2020 11:27  Ref. Range 11/01/2020 08:00 11/01/2020 12:27 11/01/2020 17:08 11/01/2020 21:29 11/02/2020 07:42  Glucose-Capillary Latest Ref Range: 70 - 99 mg/dL 258 (H) 240 (H) 149 (H) 179 (H) 244 (H)  Diabetes history: None Outpatient Diabetes medications: None Current orders for Inpatient glycemic control: Lantus 5 units bid, Novolog 0-15 units tidwc and 0-5 units QHS  HgbA1C of 6.1% indicates pre-diabetes. Hyperglycemia likely from steroids. Inpatient Diabetes Program Recommendations:   Consider increasing Lantus to 10 units bid and add Novolog 3 units tid with meals (hold if patient eats less than 50% or NPO).   Thanks,  Adah Perl, RN, BC-ADM Inpatient Diabetes Coordinator Pager [phone] (8a-5p)

## 2020-11-02 NOTE — Progress Notes (Signed)
OT Cancellation Note  Patient Details Name: Lawrence [name] MRN: [id number] DOB: 10/31/1951   Cancelled Treatment:    Reason Eval/Treat Not Completed: Patient declined, no reason specified Patient states he wants to make a will, has changed to a DNR and despite encouragement/education on importance of mobility declines therapy.  Delbert Phenix OT OT pager: [phone]   Rosemary Holms 11/02/2020, 2:24 PM

## 2020-11-02 NOTE — Progress Notes (Signed)
[facility] Progress Note  Patient: Lawrence [name]    ZOX:[id number]  DOA: 10/18/2020     Date of Service: the patient was seen and examined on 11/02/2020  Brief hospital course: Paroxysmal A. fib, aneurysm.  Presented with complaints of COVID-19 pneumonia and hypoxia. Currently plan is continue supportive care.  Assessment and Plan: 1.  Acute hypoxic respiratory failure, POA Acute COVID-19 Viral Pneumonia CXR: hazy bilateral peripheral opacities Oxygen requirement: Continuing on heated high flow at 55 LPM plus NRB at 15 L at 92% saturation CRP: 0.6 now Remdesivir: Completed course Steroids: 60 mg twice daily Solu-Medrol, day 9 Baricitinib/Actemra(off-label use): Not indicated for now The investigational nature of this medication was discussed with the patient/HCPOA and they choose to proceed as the potential benefits are felt to outweigh risks at this time.  Antibiotics: None Vitamin C and Zinc: Continue DVT Prophylaxis: enoxaparin (LOVENOX) injection 40 mg Start: 10/31/20 2200 SCDs Start: 10/15/2020 2204 Prone positioning and incentive spirometer use recommended.  The treatment plan and use of medications and known side effects were discussed with patient/family. It was clearly explained that Complete risks and long-term side effects are unknown, however in the best clinical judgment they seem to be of some clinical benefit rather than medical risks. Patient/family agree with the treatment plan and want to receive these treatments as indicated.   2.  QT prolonged duration Monitor.  Currently improving.  3.  Capacity evaluation Psychiatry consulted. Patient does have capacity to make medical decisions.  4.  D-dimer elevation Patient was on therapeutic anticoagulation. Lower extremity Doppler negative. PCCM was consulted and recommended to discontinue anticoagulation if the lower extremity Doppler is is negative.  Back to DVT prophylaxis.  5.  Chronic A. fib On  Cardizem. Not on anticoagulation prior to admission. Currently does not want to go on anticoagulation.  Monitor.  6.  HLD Hold statin  7.  Goals of care conversation. Discussed with patient as well as sister. Patient not improving in terms of his oxygenation despite improvement in inflammation. At present remains at poor outcome risk from COVID-19 pneumonia. Patient was DNI based on conversation with palliative care. There is significantly low chance of patient surviving a cardiac arrest event given his severe respiratory distress without intubation. Should the patient get intubated significantly low chance of patient able to come off of the ventilator as well. Based on this information patient would like to transition to DNR/DNI. Goal remains to treat what is treatable within this limits.  8.  LFT elevation but Currently improving.  Monitor.  Diet: Cardiac diet DVT Prophylaxis:   enoxaparin (LOVENOX) injection 40 mg Start: 10/31/20 2200 SCDs Start: 11/04/2020 2204    Advance goals of care discussion: Limited code  Family Communication: no family was present at bedside, at the time of interview.   Disposition:  Status is: Inpatient  Remains inpatient appropriate because:Inpatient level of care appropriate due to severity of illness   Dispo:  Patient From: Home  Planned Disposition: Home  Expected discharge date: 11/07/2020  Medically stable for discharge: No  Subjective: No nausea no vomiting.  No fever no chills.  Continues to have cough and shortness of breath.  Physical Exam:  General: Appear in moderate distress, no Rash; Oral Mucosa Clear, moist. no Abnormal Neck Mass Or lumps, Conjunctiva normal  Cardiovascular: S1 and S2 Present, no Murmur, Respiratory: increased respiratory effort, Bilateral Air entry present and bilateral  Crackles, no wheezes Abdomen: Bowel Sound present, Soft and no tenderness Extremities: no Pedal edema  Neurology: alert and oriented to  time, place, and person affect anxious. no new focal deficit Gait not checked due to patient safety concerns  Vitals:   11/02/20 0412 11/02/20 0414 11/02/20 0500 11/02/20 1402  BP: (!) 151/90   (!) 148/98  Pulse: 64 81 66 85  Resp: (!) 25 (!) 32 (!) 23 (!) 21  Temp:    98 F (36.7 C)  TempSrc:    Oral  SpO2: 90% (!) 84% 96% 94%  Weight:      Height:        Intake/Output Summary (Last 24 hours) at 11/02/2020 1830 Last data filed at 11/02/2020 1401 Gross per 24 hour  Intake 600 ml  Output 0 ml  Net 600 ml   Filed Weights   12/08/2020 1322  Weight: 97.1 kg    Data Reviewed: I have personally reviewed and interpreted daily labs, tele strips, imagings as discussed above. I reviewed all nursing notes, pharmacy notes, vitals, pertinent old records I have discussed plan of care as described above with RN and patient/family.  CBC: Recent Labs  Lab 10/29/20 0350 10/30/20 0428 10/31/20 0728 11/01/20 0829 11/02/20 0509  WBC 11.2* 11.8* 12.5* 16.4* 14.8*  NEUTROABS 9.9* 10.4* 11.4* 14.9* 13.4*  HGB 17.2* 18.0* 18.5* 18.4* 17.5*  HCT 51.1 53.6* 55.2* 54.2* 51.3  MCV 91.9 92.1 93.2 91.1 90.2  PLT 259 290 293 290 846   Basic Metabolic Panel: Recent Labs  Lab 10/29/20 0350 10/30/20 0428 10/31/20 0728 11/01/20 0829 11/02/20 0509  NA 140 142 136 137 136  K 4.8 4.7 4.6 4.3 4.7  CL 102 105 100 101 103  CO2 27 26 25 27 24   GLUCOSE 182* 187* 314* 240* 308*  BUN 48* 49* 50* 49* 60*  CREATININE 1.01 0.98 1.08 1.14 1.13  CALCIUM 9.1 8.9 8.9 9.0 8.7*  MG 2.4 2.3 2.4 2.3 2.5*  PHOS 3.7 3.7 4.0 4.3 4.4    Studies: DG CHEST PORT 1 VIEW  Result Date: 11/02/2020 CLINICAL DATA:  Shortness of breath, COVID-19 positivity EXAM: PORTABLE CHEST 1 VIEW COMPARISON:  10/30/2020 FINDINGS: Cardiac shadow is within normal limits. Postsurgical changes are seen. Increasing bilateral airspace opacities are noted consistent with the given clinical history. No sizable effusion is seen. No bony  abnormality is noted. IMPRESSION: Bilateral airspace opacities increased in the interval from the prior exam consistent with the given clinical history. Electronically Signed   By: Inez Catalina M.D.   On: 11/02/2020 09:11    Scheduled Meds: . albuterol  2 puff Inhalation Q6H WA  . vitamin C  500 mg Oral BID  . atorvastatin  20 mg Oral Daily  . cholecalciferol  1,000 Units Oral Daily  . dextromethorphan-guaiFENesin  1 tablet Oral BID  . diltiazem  120 mg Oral Daily  . enoxaparin (LOVENOX) injection  40 mg Subcutaneous Q24H  . furosemide  20 mg Intravenous Once  . insulin aspart  0-15 Units Subcutaneous TID WC  . insulin aspart  0-5 Units Subcutaneous QHS  . insulin glargine  5 Units Subcutaneous BID  . methylPREDNISolone (SOLU-MEDROL) injection  60 mg Intravenous Q12H  . pantoprazole  40 mg Oral BID AC  . zinc sulfate  220 mg Oral Daily   Continuous Infusions: PRN Meds: acetaminophen, guaiFENesin-dextromethorphan, ondansetron **OR** ondansetron (ZOFRAN) IV, sodium chloride  Time spent: 35 minutes  Author: Berle Mull, MD [facility] 11/02/2020 6:30 PM  To reach On-call, see care teams to locate the attending and reach out via [email or web address]. Between 7PM-7AM,  please contact night-coverage If you still have difficulty reaching the attending provider, please page the [facility] (Director on Call) for [facility] on amion for assistance.

## 2020-11-02 NOTE — Progress Notes (Signed)
PHYSICAL THERAPY  Pt declined any attempt for mobility, waving me off and shaking his head.  No other needs while I was in room.  Pt visibly ill and breathing heavy.  Pt on heated HF 55L/min 100%  NRB sats 95% Will attempt to see another day as schedule permits.  Rica Koyanagi  PTA Acute  Rehabilitation Services Pager      [phone] Office      [phone]

## 2020-11-02 NOTE — Progress Notes (Signed)
BiPAP ordered PRN

## 2020-11-03 LAB — CBC WITH DIFFERENTIAL/PLATELET
Abs Immature Granulocytes: 0.19 10*3/uL — ABNORMAL HIGH (ref 0.00–0.07)
Basophils Absolute: 0.1 10*3/uL (ref 0.0–0.1)
Basophils Relative: 0 %
Eosinophils Absolute: 0 10*3/uL (ref 0.0–0.5)
Eosinophils Relative: 0 %
HCT: 54.8 % — ABNORMAL HIGH (ref 39.0–52.0)
Hemoglobin: 18.3 g/dL — ABNORMAL HIGH (ref 13.0–17.0)
Immature Granulocytes: 1 %
Lymphocytes Relative: 4 %
Lymphs Abs: 0.5 10*3/uL — ABNORMAL LOW (ref 0.7–4.0)
MCH: 31.1 pg (ref 26.0–34.0)
MCHC: 33.4 g/dL (ref 30.0–36.0)
MCV: 93.2 fL (ref 80.0–100.0)
Monocytes Absolute: 0.7 10*3/uL (ref 0.1–1.0)
Monocytes Relative: 5 %
Neutro Abs: 12.4 10*3/uL — ABNORMAL HIGH (ref 1.7–7.7)
Neutrophils Relative %: 90 %
Platelets: 252 10*3/uL (ref 150–400)
RBC: 5.88 MIL/uL — ABNORMAL HIGH (ref 4.22–5.81)
RDW: 13 % (ref 11.5–15.5)
WBC: 13.9 10*3/uL — ABNORMAL HIGH (ref 4.0–10.5)
nRBC: 0 % (ref 0.0–0.2)

## 2020-11-03 LAB — COMPREHENSIVE METABOLIC PANEL
ALT: 178 U/L — ABNORMAL HIGH (ref 0–44)
AST: 78 U/L — ABNORMAL HIGH (ref 15–41)
Albumin: 2.8 g/dL — ABNORMAL LOW (ref 3.5–5.0)
Alkaline Phosphatase: 56 U/L (ref 38–126)
Anion gap: 8 (ref 5–15)
BUN: 51 mg/dL — ABNORMAL HIGH (ref 8–23)
CO2: 25 mmol/L (ref 22–32)
Calcium: 8.8 mg/dL — ABNORMAL LOW (ref 8.9–10.3)
Chloride: 101 mmol/L (ref 98–111)
Creatinine, Ser: 0.92 mg/dL (ref 0.61–1.24)
GFR, Estimated: >60 mL/min (ref >60)
Glucose, Bld: 254 mg/dL — ABNORMAL HIGH (ref 70–99)
Potassium: 4.9 mmol/L (ref 3.5–5.1)
Sodium: 134 mmol/L — ABNORMAL LOW (ref 135–145)
Total Bilirubin: 1.2 mg/dL (ref 0.3–1.2)
Total Protein: 5.8 g/dL — ABNORMAL LOW (ref 6.5–8.1)

## 2020-11-03 LAB — FERRITIN: Ferritin: 1232 ng/mL — ABNORMAL HIGH (ref 24–336)

## 2020-11-03 LAB — PHOSPHORUS: Phosphorus: 4.3 mg/dL (ref 2.5–4.6)

## 2020-11-03 LAB — MAGNESIUM: Magnesium: 2.4 mg/dL (ref 1.7–2.4)

## 2020-11-03 LAB — D-DIMER, QUANTITATIVE: D-Dimer, Quant: 1.81 ug/mL-FEU — ABNORMAL HIGH (ref 0.00–0.50)

## 2020-11-03 LAB — GLUCOSE, CAPILLARY
Glucose-Capillary: 245 mg/dL — ABNORMAL HIGH (ref 70–99)
Glucose-Capillary: 227 mg/dL — ABNORMAL HIGH (ref 70–99)
Glucose-Capillary: 206 mg/dL — ABNORMAL HIGH (ref 70–99)
Glucose-Capillary: 199 mg/dL — ABNORMAL HIGH (ref 70–99)

## 2020-11-03 LAB — C-REACTIVE PROTEIN: CRP: <0.5 mg/dL (ref <1.0)

## 2020-11-03 MED ORDER — GUAIFENESIN-DM 100-10 MG/5ML PO SYRP: 5.0000 mL | ORAL_SOLUTION | ORAL | Status: DC | PRN

## 2020-11-03 NOTE — Progress Notes (Signed)
Chaplain received phone call from pt's sister, Barbie.  Provided support with sister via phone, who requested priest visit pt.  She was unsure of pt's faith background, so this chaplain visited pt to assess.    Pt is protestant and appreciated chaplain visit.  He feels he will die from his illness.  Spent time reflecting on "having a good life" and expressing gratefulness.  He stated he is concerned about his friends.  He recalled burying his spouse three years ago following her death from lung cancer.  They were together 25 years.   Pt's spouse is buried in [place] and he expressed wishes for cremation and to be buried next to her.     He stated he would like to look into making a will, and spoke with chaplain about his friend Lawrence [name] and sister, Lawrence [name], whom he trusts to make arrangements for him.  Chaplain related process for Advance Directives - noting challenge of getting notary and witnesses.  Explained [place] statute around default HCPOA and next of kin.

## 2020-11-03 NOTE — Plan of Care (Signed)
  Problem: Education: Goal: Knowledge of General Education information will improve Description: Including pain rating scale, medication(s)/side effects and non-pharmacologic comfort measures Outcome: Progressing   Problem: Clinical Measurements: Goal: Respiratory complications will improve Outcome: Progressing   Problem: Coping: Goal: Level of anxiety will decrease Outcome: Progressing   Problem: Pain Managment: Goal: General experience of comfort will improve Outcome: Progressing   Problem: Education: Goal: Knowledge of risk factors and measures for prevention of condition will improve Outcome: Progressing   Problem: Respiratory: Goal: Will maintain a patent airway Outcome: Progressing

## 2020-11-03 NOTE — Progress Notes (Addendum)
Patient's have been on heated high flow and non-rebreather, not in any distress, Yellow MEWs protocol initiated and will continue to assess patient and F/U with plan of care

## 2020-11-03 NOTE — Progress Notes (Signed)
[facility] Progress Note  Patient: Lawrence [name]    QIH:[id number]  DOA: 11/09/2020     Date of Service: the patient was seen and examined on 11/03/2020  Brief hospital course: Paroxysmal A. fib, aneurysm.  Presented with complaints of COVID-19 pneumonia and hypoxia. Currently plan is continue supportive care.  Assessment and Plan: 1.  Acute hypoxic respiratory failure, POA Acute COVID-19 Viral Pneumonia CXR: hazy bilateral peripheral opacities Oxygen requirement: Continuing on heated high flow at 55 LPM plus NRB at 15 L at 92% saturation CRP: 0.6 now Remdesivir: Completed course Steroids: 60 mg twice daily Solu-Medrol, day 10, will start tapering tomorrow Baricitinib/Actemra(off-label use): Not indicated for now The investigational nature of this medication was discussed with the patient/HCPOA and they choose to proceed as the potential benefits are felt to outweigh risks at this time.  Antibiotics: None Vitamin C and Zinc: Continue DVT Prophylaxis: enoxaparin (LOVENOX) injection 40 mg Start: 10/31/20 2200 SCDs Start: 11/02/2020 2204 Prone positioning and incentive spirometer use recommended.  The treatment plan and use of medications and known side effects were discussed with patient/family. It was clearly explained that Complete risks and long-term side effects are unknown, however in the best clinical judgment they seem to be of some clinical benefit rather than medical risks. Patient/family agree with the treatment plan and want to receive these treatments as indicated.   2.  QT prolonged duration Monitor.  Currently improving.  3.  Capacity evaluation Psychiatry consulted. Patient does have capacity to make medical decisions.  4.  D-dimer elevation Patient was on therapeutic anticoagulation. Lower extremity Doppler negative. PCCM was consulted and recommended to discontinue anticoagulation if the lower extremity Doppler is is negative.  Back to DVT  prophylaxis.  5.  Chronic A. fib On Cardizem. Not on anticoagulation prior to admission. Currently does not want to go on anticoagulation.  Monitor.  6.  HLD Hold statin  7.  Goals of care conversation. Discussed with patient as well as sister. Patient not improving in terms of his oxygenation despite improvement in inflammation. At present remains at poor outcome risk from COVID-19 pneumonia. Patient was DNI based on conversation with palliative care. There is significantly low chance of patient surviving a cardiac arrest event given his severe respiratory distress without intubation. Should the patient get intubated significantly low chance of patient able to come off of the ventilator as well. Based on this information patient would like to transition to DNR/DNI. Goal remains to treat what is treatable within this limits. On 12/24 patient refused all his medication.  Currently that he is not going to make it.  Explained in detail that what ever chance that he may have to survive the hospital stay will go completely zero if he does not take any medication.  He currently agrees to continue taking some of the medications but not all of them.  He prefers IV over pills.  Medications were modified and multivitamins were removed to help the patient minimize pill burden.  8.  LFT elevation Currently improving.  Monitor.  Diet: Cardiac diet DVT Prophylaxis:   enoxaparin (LOVENOX) injection 40 mg Start: 10/31/20 2200 SCDs Start: 11/07/2020 2204    Advance goals of care discussion: Limited code  Family Communication: no family was present at bedside, at the time of interview.   Disposition:  Status is: Inpatient  Remains inpatient appropriate because:Inpatient level of care appropriate due to severity of illness   Dispo:  Patient From: Home  Planned Disposition: Home  Expected discharge  date: 11/07/2020  Medically stable for discharge: No  Subjective: No nausea no vomiting.   Continues to have shortness of breath continues to have cough.  Had a bowel movement last night as well.  Physical Exam:  General: Appear in moderate distress, no Rash; Oral Mucosa Clear, moist. no Abnormal Neck Mass Or lumps, Conjunctiva normal  Cardiovascular: S1 and S2 Present, no Murmur, Respiratory: increased respiratory effort, Bilateral Air entry present and bilateral  Crackles, no wheezes Abdomen: Bowel Sound present, Soft and no tenderness Extremities: no Pedal edema Neurology: alert and oriented to time, place, and person affect appropriate. no new focal deficit Gait not checked due to patient safety concerns   Vitals:   11/03/20 1012 11/03/20 1200 11/03/20 1246 11/03/20 1452  BP: (!) 145/92  (!) 145/96   Pulse:  83 79 68  Resp:  (!) 24 17 (!) 22  Temp:   97.9 F (36.6 C)   TempSrc:      SpO2:  95% 97% 96%  Weight:      Height:        Intake/Output Summary (Last 24 hours) at 11/03/2020 1629 Last data filed at 11/03/2020 1400 Gross per 24 hour  Intake 360 ml  Output 1300 ml  Net -940 ml   Filed Weights   10/13/2020 1322  Weight: 97.1 kg    Data Reviewed: I have personally reviewed and interpreted daily labs, tele strips, imagings as discussed above. I reviewed all nursing notes, pharmacy notes, vitals, pertinent old records I have discussed plan of care as described above with RN and patient/family.  CBC: Recent Labs  Lab 10/30/20 0428 10/31/20 0728 11/01/20 0829 11/02/20 0509 11/03/20 0454  WBC 11.8* 12.5* 16.4* 14.8* 13.9*  NEUTROABS 10.4* 11.4* 14.9* 13.4* 12.4*  HGB 18.0* 18.5* 18.4* 17.5* 18.3*  HCT 53.6* 55.2* 54.2* 51.3 54.8*  MCV 92.1 93.2 91.1 90.2 93.2  PLT 290 293 290 265 [id number]   Basic Metabolic Panel: Recent Labs  Lab 10/30/20 0428 10/31/20 0728 11/01/20 0829 11/02/20 0509 11/03/20 0454  NA 142 136 137 136 134*  K 4.7 4.6 4.3 4.7 4.9  CL 105 100 101 103 101  CO2 26 25 27 24 25   GLUCOSE 187* 314* 240* 308* 254*  BUN 49* 50* 49*  60* 51*  CREATININE 0.98 1.08 1.14 1.13 0.92  CALCIUM 8.9 8.9 9.0 8.7* 8.8*  MG 2.3 2.4 2.3 2.5* 2.4  PHOS 3.7 4.0 4.3 4.4 4.3    Studies: No results found.  Scheduled Meds: . albuterol  2 puff Inhalation Q6H WA  . atorvastatin  20 mg Oral Daily  . diltiazem  120 mg Oral Daily  . enoxaparin (LOVENOX) injection  40 mg Subcutaneous Q24H  . insulin aspart  0-15 Units Subcutaneous TID WC  . insulin aspart  0-5 Units Subcutaneous QHS  . insulin glargine  5 Units Subcutaneous BID  . methylPREDNISolone (SOLU-MEDROL) injection  60 mg Intravenous Q12H  . pantoprazole  40 mg Oral BID AC   Continuous Infusions: PRN Meds: acetaminophen, guaiFENesin-dextromethorphan, ondansetron **OR** ondansetron (ZOFRAN) IV, sodium chloride  Time spent: 35 minutes  Author: Berle Mull, MD [facility] 11/03/2020 4:29 PM  To reach On-call, see care teams to locate the attending and reach out via [email or web address]. Between 7PM-7AM, please contact night-coverage If you still have difficulty reaching the attending provider, please page the [facility] (Director on Call) for [facility] on amion for assistance.

## 2020-11-04 LAB — GLUCOSE, CAPILLARY
Glucose-Capillary: 294 mg/dL — ABNORMAL HIGH (ref 70–99)
Glucose-Capillary: 248 mg/dL — ABNORMAL HIGH (ref 70–99)
Glucose-Capillary: 244 mg/dL — ABNORMAL HIGH (ref 70–99)
Glucose-Capillary: 256 mg/dL — ABNORMAL HIGH (ref 70–99)

## 2020-11-04 LAB — PHOSPHORUS: Phosphorus: 3.9 mg/dL (ref 2.5–4.6)

## 2020-11-04 LAB — D-DIMER, QUANTITATIVE: D-Dimer, Quant: 1.61 ug/mL-FEU — ABNORMAL HIGH (ref 0.00–0.50)

## 2020-11-04 LAB — COMPREHENSIVE METABOLIC PANEL
ALT: 126 U/L — ABNORMAL HIGH (ref 0–44)
AST: 35 U/L (ref 15–41)
Albumin: 2.6 g/dL — ABNORMAL LOW (ref 3.5–5.0)
Alkaline Phosphatase: 60 U/L (ref 38–126)
Anion gap: 10 (ref 5–15)
BUN: 49 mg/dL — ABNORMAL HIGH (ref 8–23)
CO2: 25 mmol/L (ref 22–32)
Calcium: 8.7 mg/dL — ABNORMAL LOW (ref 8.9–10.3)
Chloride: 101 mmol/L (ref 98–111)
Creatinine, Ser: 1.15 mg/dL (ref 0.61–1.24)
GFR, Estimated: >60 mL/min (ref >60)
Glucose, Bld: 287 mg/dL — ABNORMAL HIGH (ref 70–99)
Potassium: 5 mmol/L (ref 3.5–5.1)
Sodium: 136 mmol/L (ref 135–145)
Total Bilirubin: 1.1 mg/dL (ref 0.3–1.2)
Total Protein: 5.2 g/dL — ABNORMAL LOW (ref 6.5–8.1)

## 2020-11-04 LAB — CBC WITH DIFFERENTIAL/PLATELET
Abs Immature Granulocytes: 0.18 10*3/uL — ABNORMAL HIGH (ref 0.00–0.07)
Basophils Absolute: 0 10*3/uL (ref 0.0–0.1)
Basophils Relative: 0 %
Eosinophils Absolute: 0 10*3/uL (ref 0.0–0.5)
Eosinophils Relative: 0 %
HCT: 49.2 % (ref 39.0–52.0)
Hemoglobin: 16.8 g/dL (ref 13.0–17.0)
Immature Granulocytes: 1 %
Lymphocytes Relative: 4 %
Lymphs Abs: 0.6 10*3/uL — ABNORMAL LOW (ref 0.7–4.0)
MCH: 31.6 pg (ref 26.0–34.0)
MCHC: 34.1 g/dL (ref 30.0–36.0)
MCV: 92.7 fL (ref 80.0–100.0)
Monocytes Absolute: 0.9 10*3/uL (ref 0.1–1.0)
Monocytes Relative: 6 %
Neutro Abs: 14.2 10*3/uL — ABNORMAL HIGH (ref 1.7–7.7)
Neutrophils Relative %: 89 %
Platelets: 218 10*3/uL (ref 150–400)
RBC: 5.31 MIL/uL (ref 4.22–5.81)
RDW: 12.9 % (ref 11.5–15.5)
WBC: 15.9 10*3/uL — ABNORMAL HIGH (ref 4.0–10.5)
nRBC: 0 % (ref 0.0–0.2)

## 2020-11-04 LAB — MAGNESIUM: Magnesium: 2.3 mg/dL (ref 1.7–2.4)

## 2020-11-04 LAB — C-REACTIVE PROTEIN: CRP: 0.6 mg/dL (ref <1.0)

## 2020-11-04 LAB — FERRITIN: Ferritin: 1055 ng/mL — ABNORMAL HIGH (ref 24–336)

## 2020-11-04 MED ORDER — METHYLPREDNISOLONE SODIUM SUCC 125 MG IJ SOLR
40.0000 mg | Freq: Two times a day (BID) | INTRAMUSCULAR | Status: DC
  Administered 2020-11-05 – 2020-11-06 (×3): 40 mg via INTRAVENOUS
  Filled 2020-11-04 (×3): qty 2

## 2020-11-04 MED ORDER — ALBUTEROL SULFATE HFA 108 (90 BASE) MCG/ACT IN AERS
2.0000 | INHALATION_SPRAY | Freq: Three times a day (TID) | RESPIRATORY_TRACT | Status: DC
  Administered 2020-11-05 – 2020-11-18 (×40): 2 via RESPIRATORY_TRACT

## 2020-11-04 NOTE — Progress Notes (Signed)
[facility] Progress Note  Patient: Lawrence [name]    [id number]  DOA: 11/10/2020     Date of Service: the patient was seen and examined on 11/04/2020  Brief hospital course: Paroxysmal A. fib, aneurysm.  Presented with complaints of COVID-19 pneumonia and hypoxia. Currently plan is continue supportive care.  Assessment and Plan: 1.  Acute hypoxic respiratory failure, POA 88% on room air on admission Acute COVID-19 Viral Pneumonia CXR: hazy bilateral peripheral opacities Oxygen requirement: Continuing on heated high flow at 55 LPM plus NRB at 15 L at 90% saturation CRP: 0.6 now Remdesivir: Completed course Steroids: 40 mg twice daily Solu-Medrol, Baricitinib/Actemra(off-label use): Patient refused The investigational nature of this medication was discussed with the patient/HCPOA and they choose to proceed as the potential benefits are felt to outweigh risks at this time.  Antibiotics: None Vitamin C and Zinc: Continue DVT Prophylaxis: enoxaparin (LOVENOX) injection 40 mg Start: 10/31/20 2200 SCDs Start: 10/28/2020 2204 Prone positioning and incentive spirometer use recommended.  The treatment plan and use of medications and known side effects were discussed with patient/family. It was clearly explained that Complete risks and long-term side effects are unknown, however in the best clinical judgment they seem to be of some clinical benefit rather than medical risks. Patient/family agree with the treatment plan and want to receive these treatments as indicated.   2.  QT prolongation Monitor.  Currently improving.  3.  Capacity evaluation Psychiatry consulted. Patient does have capacity to make medical decisions.  4.  D-dimer elevation Patient was on therapeutic anticoagulation. Lower extremity Doppler negative. PCCM was consulted and recommended to discontinue anticoagulation if the lower extremity Doppler is is negative.  Back to DVT prophylaxis.  5.  Chronic A.  fib On Cardizem. Not on anticoagulation prior to admission. Currently does not want to go on anticoagulation.  Monitor.  6.  HLD Hold statin  7.  Goals of care conversation. Discussed with patient as well as sister. Patient not improving in terms of his oxygenation despite improvement in inflammation. At present remains at poor outcome risk from COVID-19 pneumonia. Patient was DNI based on conversation with palliative care. There is significantly low chance of patient surviving a cardiac arrest event given his severe respiratory distress without intubation. Should the patient get intubated significantly low chance of patient able to come off of the ventilator as well. Based on this information patient would like to transition to DNR/DNI. Goal remains to treat what is treatable within this limits. On 12/24 patient refused all his medication.  Explained in detail that what ever chance that he may have, to survive the hospital stay, will go completely zero if he does not take any medication.  He currently agrees to continue taking some of the medications but not all of them.  He prefers IV over pills.  Medications were modified and multivitamins were removed to help the patient minimize pill burden.  8.  LFT elevation Currently improving.  Monitor.  Diet: Cardiac diet DVT Prophylaxis:   enoxaparin (LOVENOX) injection 40 mg Start: 10/31/20 2200 SCDs Start: 10/24/2020 2204    Advance goals of care discussion: Limited code  Family Communication: no family was present at bedside, at the time of interview.  Discussed with sister on the phone.  Disposition:  Status is: Inpatient  Remains inpatient appropriate because:Inpatient level of care appropriate due to severity of illness   Dispo:  Patient From: Home  Planned Disposition: Home  Expected discharge date: 11/07/2020  Medically stable for discharge:  No  Subjective: No nausea no vomiting no fever no chills.  No chest pain abdominal  pain.  Continues to have cough continues to have shortness of breath.  Still in respiratory distress.  Minimal exertion causes severe shortness of breath as well as tachycardia  Physical Exam:  General: Appear in moderate distress, no Rash; Oral Mucosa Clear, moist. no Abnormal Neck Mass Or lumps, Conjunctiva normal  Cardiovascular: S1 and S2 Present, no Murmur, Respiratory: increased respiratory effort, Bilateral Air entry present and bilateral  Crackles, no wheezes Abdomen: Bowel Sound present, Soft and no tenderness Extremities: no Pedal edema Neurology: alert and oriented to time, place, and person affect appropriate. no new focal deficit Gait not checked due to patient safety concerns   Vitals:   11/04/20 0310 11/04/20 0451 11/04/20 1138 11/04/20 1409  BP:   128/84 114/81  Pulse:   79 66  Resp:  20 (!) 25   Temp:  97.6 F (36.4 C) 97.6 F (36.4 C)   TempSrc:  Oral Oral   SpO2: 95%   95%  Weight:      Height:        Intake/Output Summary (Last 24 hours) at 11/04/2020 1733 Last data filed at 11/04/2020 1200 Gross per 24 hour  Intake 340 ml  Output 1275 ml  Net -935 ml   Filed Weights   10/31/2020 1322  Weight: 97.1 kg    Data Reviewed: I have personally reviewed and interpreted daily labs, tele strips, imagings as discussed above. I reviewed all nursing notes, pharmacy notes, vitals, pertinent old records I have discussed plan of care as described above with RN and patient/family.  CBC: Recent Labs  Lab 10/31/20 0728 11/01/20 0829 11/02/20 0509 11/03/20 0454 11/04/20 0445  WBC 12.5* 16.4* 14.8* 13.9* 15.9*  NEUTROABS 11.4* 14.9* 13.4* 12.4* 14.2*  HGB 18.5* 18.4* 17.5* 18.3* 16.8  HCT 55.2* 54.2* 51.3 54.8* 49.2  MCV 93.2 91.1 90.2 93.2 92.7  PLT 293 290 265 252 657   Basic Metabolic Panel: Recent Labs  Lab 10/31/20 0728 11/01/20 0829 11/02/20 0509 11/03/20 0454 11/04/20 0445  NA 136 137 136 134* 136  K 4.6 4.3 4.7 4.9 5.0  CL 100 101 103 101 101   CO2 25 27 24 25 25   GLUCOSE 314* 240* 308* 254* 287*  BUN 50* 49* 60* 51* 49*  CREATININE 1.08 1.14 1.13 0.92 1.15  CALCIUM 8.9 9.0 8.7* 8.8* 8.7*  MG 2.4 2.3 2.5* 2.4 2.3  PHOS 4.0 4.3 4.4 4.3 3.9    Studies: No results found.  Scheduled Meds: . albuterol  2 puff Inhalation Q6H WA  . atorvastatin  20 mg Oral Daily  . diltiazem  120 mg Oral Daily  . enoxaparin (LOVENOX) injection  40 mg Subcutaneous Q24H  . insulin aspart  0-15 Units Subcutaneous TID WC  . insulin aspart  0-5 Units Subcutaneous QHS  . insulin glargine  5 Units Subcutaneous BID  . methylPREDNISolone (SOLU-MEDROL) injection  60 mg Intravenous Q12H  . pantoprazole  40 mg Oral BID AC   Continuous Infusions: PRN Meds: acetaminophen, guaiFENesin-dextromethorphan, ondansetron **OR** ondansetron (ZOFRAN) IV, sodium chloride  Time spent: 35 minutes  Author: Berle Mull, MD [facility] 11/04/2020 5:33 PM  To reach On-call, see care teams to locate the attending and reach out via [email or web address]. Between 7PM-7AM, please contact night-coverage If you still have difficulty reaching the attending provider, please page the [facility] (Director on Call) for [facility] on amion for assistance.

## 2020-11-05 LAB — COMPREHENSIVE METABOLIC PANEL
ALT: 101 U/L — ABNORMAL HIGH (ref 0–44)
AST: 28 U/L (ref 15–41)
Albumin: 2.5 g/dL — ABNORMAL LOW (ref 3.5–5.0)
Alkaline Phosphatase: 55 U/L (ref 38–126)
Anion gap: 7 (ref 5–15)
BUN: 46 mg/dL — ABNORMAL HIGH (ref 8–23)
CO2: 29 mmol/L (ref 22–32)
Calcium: 8.4 mg/dL — ABNORMAL LOW (ref 8.9–10.3)
Chloride: 100 mmol/L (ref 98–111)
Creatinine, Ser: 1.06 mg/dL (ref 0.61–1.24)
GFR, Estimated: >60 mL/min (ref >60)
Glucose, Bld: 196 mg/dL — ABNORMAL HIGH (ref 70–99)
Potassium: 5.1 mmol/L (ref 3.5–5.1)
Sodium: 136 mmol/L (ref 135–145)
Total Bilirubin: 1.3 mg/dL — ABNORMAL HIGH (ref 0.3–1.2)
Total Protein: 5 g/dL — ABNORMAL LOW (ref 6.5–8.1)

## 2020-11-05 LAB — GLUCOSE, CAPILLARY
Glucose-Capillary: 262 mg/dL — ABNORMAL HIGH (ref 70–99)
Glucose-Capillary: 220 mg/dL — ABNORMAL HIGH (ref 70–99)
Glucose-Capillary: 164 mg/dL — ABNORMAL HIGH (ref 70–99)
Glucose-Capillary: 267 mg/dL — ABNORMAL HIGH (ref 70–99)

## 2020-11-05 LAB — CBC
HCT: 48.5 % (ref 39.0–52.0)
Hemoglobin: 16.5 g/dL (ref 13.0–17.0)
MCH: 31.5 pg (ref 26.0–34.0)
MCHC: 34 g/dL (ref 30.0–36.0)
MCV: 92.6 fL (ref 80.0–100.0)
Platelets: 191 10*3/uL (ref 150–400)
RBC: 5.24 MIL/uL (ref 4.22–5.81)
RDW: 12.9 % (ref 11.5–15.5)
WBC: 12.9 10*3/uL — ABNORMAL HIGH (ref 4.0–10.5)
nRBC: 0 % (ref 0.0–0.2)

## 2020-11-05 MED ORDER — SODIUM CHLORIDE 0.9% FLUSH: 10.0000 mL | INTRAVENOUS | Status: DC | PRN

## 2020-11-05 MED ORDER — SODIUM CHLORIDE 0.9% FLUSH
10.0000 mL | Freq: Two times a day (BID) | INTRAVENOUS | Status: DC
  Administered 2020-11-05 – 2020-11-21 (×20): 10 mL

## 2020-11-05 NOTE — Plan of Care (Signed)
  Problem: Clinical Measurements: Goal: Ability to maintain clinical measurements within normal limits will improve Outcome: Progressing Goal: Respiratory complications will improve Outcome: Progressing   Problem: Activity: Goal: Risk for activity intolerance will decrease Outcome: Progressing   

## 2020-11-05 NOTE — Progress Notes (Signed)
[facility] Progress Note  Patient: Lawrence [name]    [id number]  DOA: 11/04/2020     Date of Service: the patient was seen and examined on 11/05/2020  Brief hospital course: Paroxysmal A. fib, aneurysm.  Presented with complaints of COVID-19 pneumonia and hypoxia. Currently plan is continue supportive care.  Assessment and Plan: 1.  Acute hypoxic respiratory failure, POA 88% on room air on admission Acute COVID-19 Viral Pneumonia CXR: hazy bilateral peripheral opacities Oxygen requirement: Continuing on heated high flow at 55 LPM, now not needing NRB CRP: 0.6 now Remdesivir: Completed course Steroids: 40 mg twice daily Solu-Medrol, Baricitinib/Actemra(off-label use): Patient refused The investigational nature of this medication was discussed with the patient/HCPOA and they choose to proceed as the potential benefits are felt to outweigh risks at this time.  Antibiotics: None Vitamin C and Zinc: Continue DVT Prophylaxis: enoxaparin (LOVENOX) injection 40 mg Start: 10/31/20 2200 SCDs Start: 10/11/2020 2204 Prone positioning and incentive spirometer use recommended.  The treatment plan and use of medications and known side effects were discussed with patient/family. It was clearly explained that Complete risks and long-term side effects are unknown, however in the best clinical judgment they seem to be of some clinical benefit rather than medical risks. Patient/family agree with the treatment plan and want to receive these treatments as indicated.   2.  QT prolongation Monitor.  Currently improving.  3.  Capacity evaluation Psychiatry consulted. Patient does have capacity to make medical decisions.  4.  D-dimer elevation Patient was on therapeutic anticoagulation. Lower extremity Doppler negative. PCCM was consulted and recommended to discontinue anticoagulation if the lower extremity Doppler is is negative.  Back to DVT prophylaxis.  5.  Chronic A. fib On  Cardizem. Not on anticoagulation prior to admission. Currently does not want to go on anticoagulation.  Monitor.  6.  HLD Hold statin  7.  Goals of care conversation. Discussed with patient as well as sister. Patient not improving in terms of his oxygenation despite improvement in inflammation. At present remains at poor outcome risk from COVID-19 pneumonia. Patient was DNI based on conversation with palliative care. There is significantly low chance of patient surviving a cardiac arrest event given his severe respiratory distress without intubation. Should the patient get intubated significantly low chance of patient able to come off of the ventilator as well. Based on this information patient would like to transition to DNR/DNI. Goal remains to treat what is treatable within this limits. On 12/24 patient refused all his medication.  Explained in detail that what ever chance that he may have, to survive the hospital stay, will go completely zero if he does not take any medication.  He currently agrees to continue taking some of the medications but not all of them.  He prefers IV over pills.  Medications were modified and multivitamins were removed to help the patient minimize pill burden.  8.  LFT elevation Currently improving.  Monitor.  Diet: Cardiac diet DVT Prophylaxis:   enoxaparin (LOVENOX) injection 40 mg Start: 10/31/20 2200 SCDs Start: 10/26/2020 2204    Advance goals of care discussion: Limited code  Family Communication: no family was present at bedside, at the time of interview.  Discussed with sister on the phone.  Disposition:  Status is: Inpatient  Remains inpatient appropriate because:Inpatient level of care appropriate due to severity of illness   Dispo:  Patient From: Home  Planned Disposition: Home  Expected discharge date: 11/07/2020  Medically stable for discharge: No  Subjective: No  acute complaint.  No nausea no vomiting.  No fever no chills.  No chest  pain.  No abdominal pain. Continues to have shortness of breath continues to have cough.  Reports fatigue.  Physical Exam:  General: Appear in mild distress, no Rash; Oral Mucosa Clear, no thrush, moist. no Abnormal Neck Mass Or lumps, Conjunctiva normal  Cardiovascular: S1 and S2 Present, no Murmur, Respiratory: increased respiratory effort, Bilateral Air entry present and bilateral  Crackles, no wheezes Abdomen: Bowel Sound present, Soft and no tenderness Extremities: trace Pedal edema Neurology: alert and oriented to time, place, and person affect appropriate. no new focal deficit Gait not checked due to patient safety concerns    Vitals:   11/05/20 0850 11/05/20 1150 11/05/20 1500 11/05/20 1611  BP:  139/86    Pulse: 78     Resp: 18 (!) 24    Temp:  97.6 F (36.4 C)    TempSrc:  Axillary    SpO2: 93%  96% 92%  Weight:      Height:        Intake/Output Summary (Last 24 hours) at 11/05/2020 1857 Last data filed at 11/05/2020 1543 Gross per 24 hour  Intake 780 ml  Output 250 ml  Net 530 ml   Filed Weights   Nov 13, 2020 1322  Weight: 97.1 kg    Data Reviewed: I have personally reviewed and interpreted daily labs, tele strips, imagings as discussed above. I reviewed all nursing notes, pharmacy notes, vitals, pertinent old records I have discussed plan of care as described above with RN and patient/family.  CBC: Recent Labs  Lab 10/31/20 0728 11/01/20 0829 11/02/20 0509 11/03/20 0454 11/04/20 0445 11/05/20 0340  WBC 12.5* 16.4* 14.8* 13.9* 15.9* 12.9*  NEUTROABS 11.4* 14.9* 13.4* 12.4* 14.2*  --   HGB 18.5* 18.4* 17.5* 18.3* 16.8 16.5  HCT 55.2* 54.2* 51.3 54.8* 49.2 48.5  MCV 93.2 91.1 90.2 93.2 92.7 92.6  PLT 293 290 265 252 218 703   Basic Metabolic Panel: Recent Labs  Lab 10/31/20 0728 11/01/20 0829 11/02/20 0509 11/03/20 0454 11/04/20 0445 11/05/20 0340  NA 136 137 136 134* 136 136  K 4.6 4.3 4.7 4.9 5.0 5.1  CL 100 101 103 101 101 100  CO2 25  27 24 25 25 29   GLUCOSE 314* 240* 308* 254* 287* 196*  BUN 50* 49* 60* 51* 49* 46*  CREATININE 1.08 1.14 1.13 0.92 1.15 1.06  CALCIUM 8.9 9.0 8.7* 8.8* 8.7* 8.4*  MG 2.4 2.3 2.5* 2.4 2.3  --   PHOS 4.0 4.3 4.4 4.3 3.9  --     Studies: No results found.  Scheduled Meds: . albuterol  2 puff Inhalation TID  . atorvastatin  20 mg Oral Daily  . diltiazem  120 mg Oral Daily  . enoxaparin (LOVENOX) injection  40 mg Subcutaneous Q24H  . insulin aspart  0-15 Units Subcutaneous TID WC  . insulin aspart  0-5 Units Subcutaneous QHS  . insulin glargine  5 Units Subcutaneous BID  . methylPREDNISolone (SOLU-MEDROL) injection  40 mg Intravenous BID  . pantoprazole  40 mg Oral BID AC  . sodium chloride flush  10-40 mL Intracatheter Q12H   Continuous Infusions: PRN Meds: acetaminophen, guaiFENesin-dextromethorphan, ondansetron **OR** ondansetron (ZOFRAN) IV, sodium chloride, sodium chloride flush  Time spent: 35 minutes  Author: Berle Mull, MD [facility] 11/05/2020 6:57 PM  To reach On-call, see care teams to locate the attending and reach out via [email or web address]. Between 7PM-7AM, please contact night-coverage If you  still have difficulty reaching the attending provider, please page the [facility] (Director on Call) for [facility] on amion for assistance.

## 2020-11-05 NOTE — Plan of Care (Signed)

## 2020-11-06 LAB — COMPREHENSIVE METABOLIC PANEL
ALT: 89 U/L — ABNORMAL HIGH (ref 0–44)
AST: 27 U/L (ref 15–41)
Albumin: 2.4 g/dL — ABNORMAL LOW (ref 3.5–5.0)
Alkaline Phosphatase: 56 U/L (ref 38–126)
Anion gap: 9 (ref 5–15)
BUN: 41 mg/dL — ABNORMAL HIGH (ref 8–23)
CO2: 24 mmol/L (ref 22–32)
Calcium: 8.2 mg/dL — ABNORMAL LOW (ref 8.9–10.3)
Chloride: 102 mmol/L (ref 98–111)
Creatinine, Ser: 0.95 mg/dL (ref 0.61–1.24)
GFR, Estimated: >60 mL/min (ref >60)
Glucose, Bld: 276 mg/dL — ABNORMAL HIGH (ref 70–99)
Potassium: 4.5 mmol/L (ref 3.5–5.1)
Sodium: 135 mmol/L (ref 135–145)
Total Bilirubin: 1 mg/dL (ref 0.3–1.2)
Total Protein: 4.9 g/dL — ABNORMAL LOW (ref 6.5–8.1)

## 2020-11-06 LAB — CBC
HCT: 48.1 % (ref 39.0–52.0)
Hemoglobin: 16.5 g/dL (ref 13.0–17.0)
MCH: 31.4 pg (ref 26.0–34.0)
MCHC: 34.3 g/dL (ref 30.0–36.0)
MCV: 91.4 fL (ref 80.0–100.0)
Platelets: 177 10*3/uL (ref 150–400)
RBC: 5.26 MIL/uL (ref 4.22–5.81)
RDW: 13 % (ref 11.5–15.5)
WBC: 12.5 10*3/uL — ABNORMAL HIGH (ref 4.0–10.5)
nRBC: 0 % (ref 0.0–0.2)

## 2020-11-06 LAB — GLUCOSE, CAPILLARY
Glucose-Capillary: 261 mg/dL — ABNORMAL HIGH (ref 70–99)
Glucose-Capillary: 355 mg/dL — ABNORMAL HIGH (ref 70–99)
Glucose-Capillary: 217 mg/dL — ABNORMAL HIGH (ref 70–99)
Glucose-Capillary: 282 mg/dL — ABNORMAL HIGH (ref 70–99)

## 2020-11-06 MED ORDER — PREDNISONE 50 MG PO TABS
50.0000 mg | ORAL_TABLET | Freq: Every day | ORAL | Status: DC
  Administered 2020-11-09 – 2020-11-10 (×2): 50 mg via ORAL
  Filled 2020-11-06 (×2): qty 1

## 2020-11-06 MED ORDER — INSULIN GLARGINE 100 UNIT/ML ~~LOC~~ SOLN
10.0000 [IU] | Freq: Two times a day (BID) | SUBCUTANEOUS | Status: DC
  Administered 2020-11-06 – 2020-11-11 (×10): 10 [IU] via SUBCUTANEOUS
  Filled 2020-11-06 (×10): qty 0.1

## 2020-11-06 MED ORDER — METHYLPREDNISOLONE SODIUM SUCC 125 MG IJ SOLR
40.0000 mg | Freq: Two times a day (BID) | INTRAMUSCULAR | Status: AC
  Administered 2020-11-06: 22:00:00 40 mg via INTRAVENOUS
  Filled 2020-11-06: qty 2

## 2020-11-06 MED ORDER — METHYLPREDNISOLONE SODIUM SUCC 125 MG IJ SOLR: 60.0000 mg | Freq: Every day | INTRAMUSCULAR | Status: DC

## 2020-11-06 MED ORDER — METHYLPREDNISOLONE SODIUM SUCC 125 MG IJ SOLR
60.0000 mg | Freq: Every day | INTRAMUSCULAR | Status: AC
  Administered 2020-11-07 – 2020-11-08 (×2): 60 mg via INTRAVENOUS
  Filled 2020-11-06 (×2): qty 2

## 2020-11-06 MED ORDER — PREDNISONE 50 MG PO TABS
50.0000 mg | ORAL_TABLET | Freq: Every day | ORAL | Status: DC
Start: 2020-11-08 — End: 2020-11-06

## 2020-11-06 NOTE — Progress Notes (Signed)
[facility] Progress Note  Patient: Lawrence [name]    VOJ:[id number]  DOA: 27-Oct-2020     Date of Service: the patient was seen and examined on 11/06/2020  Brief hospital course: Paroxysmal A. fib, aneurysm.  Presented with complaints of COVID-19 pneumonia and hypoxia. Currently plan is continue supportive care.  Assessment and Plan: 1.  Acute hypoxic respiratory failure, POA 88% on room air on admission Acute COVID-19 Viral Pneumonia CXR: hazy bilateral peripheral opacities Oxygen requirement: Continuing on heated high flow at 55 LPM, intermittently needing NRB. CRP: 0.6 now Remdesivir: Completed course Steroids: 40 mg twice daily Solu-Medrol, Baricitinib/Actemra(off-label use): Patient refused The investigational nature of this medication was discussed with the patient/HCPOA and they choose to proceed as the potential benefits are felt to outweigh risks at this time.  Antibiotics: None Vitamin C and Zinc: Continue DVT Prophylaxis: enoxaparin (LOVENOX) injection 40 mg Start: 10/31/20 2200 SCDs Start: 2020/10/27 2204 Prone positioning and incentive spirometer use recommended.  The treatment plan and use of medications and known side effects were discussed with patient/family. It was clearly explained that Complete risks and long-term side effects are unknown, however in the best clinical judgment they seem to be of some clinical benefit rather than medical risks. Patient/family agree with the treatment plan and want to receive these treatments as indicated.   2.  QT prolongation Monitor.  Currently improving.  3.  Capacity evaluation Psychiatry consulted. Patient does have capacity to make medical decisions.  4.  D-dimer elevation Patient was on therapeutic anticoagulation. Lower extremity Doppler negative. PCCM was consulted and recommended to discontinue anticoagulation if the lower extremity Doppler is is negative.  Back to DVT prophylaxis.  5.  Chronic A. fib On  Cardizem. Not on anticoagulation prior to admission. Currently does not want to go on anticoagulation.  Monitor.  6.  HLD Hold statin  7.  Goals of care conversation. Discussed with patient as well as sister. Patient not improving in terms of his oxygenation despite improvement in inflammation. At present remains at poor outcome risk from COVID-19 pneumonia. Patient was DNI based on conversation with palliative care. There is significantly low chance of patient surviving a cardiac arrest event given his severe respiratory distress without intubation. Should the patient get intubated significantly low chance of patient able to come off of the ventilator as well. Based on this information patient would like to transition to DNR/DNI. Goal remains to treat what is treatable within this limits. On 12/24 patient refused all his medication.  Explained in detail that what ever chance that he may have, to survive the hospital stay, will go completely zero if he does not take any medication.  He currently agrees to continue taking some of the medications but not all of them.  He prefers IV over pills.  Medications were modified and multivitamins were removed to help the patient minimize pill burden.  8.  LFT elevation Currently improving.  Monitor.  Diet: Cardiac diet DVT Prophylaxis:   enoxaparin (LOVENOX) injection 40 mg Start: 10/31/20 2200 SCDs Start: 10/27/20 2204    Advance goals of care discussion: Limited code  Family Communication: no family was present at bedside, at the time of interview.  Discussed with sister on the phone.  Disposition:  Status is: Inpatient  Remains inpatient appropriate because:Inpatient level of care appropriate due to severity of illness   Dispo:  Patient From: Home  Planned Disposition: Home  Expected discharge date: 11/09/2020  Medically stable for discharge: No  Subjective: No nausea  no vomiting.  No fever no chills.  No chest pain.  Physical  Exam:  General: Appear in mild distress, no Rash; Oral Mucosa Clear, moist. no Abnormal Neck Mass Or lumps, Conjunctiva normal  Cardiovascular: S1 and S2 Present, no Murmur, Respiratory: increased respiratory effort, Bilateral Air entry present and bilateral  Crackles, no wheezes Abdomen: Bowel Sound present, Soft and no tenderness Extremities: trace Pedal edema Neurology: alert and oriented to time, place, and person affect appropriate. no new focal deficit Gait not checked due to patient safety concerns    Vitals:   11/06/20 0548 11/06/20 0816 11/06/20 1257 11/06/20 1420  BP: 126/82  132/85   Pulse: 72 70    Resp: 16 (!) 22 (!) 22   Temp: 97.7 F (36.5 C)  (!) 97.5 F (36.4 C)   TempSrc: Oral  Oral   SpO2: (!) 87% 100% 92% 92%  Weight:      Height:        Intake/Output Summary (Last 24 hours) at 11/06/2020 1931 Last data filed at 11/06/2020 0549 Gross per 24 hour  Intake --  Output 820 ml  Net -820 ml   Filed Weights   10/16/2020 1322  Weight: 97.1 kg    Data Reviewed: I have personally reviewed and interpreted daily labs, tele strips, imagings as discussed above. I reviewed all nursing notes, pharmacy notes, vitals, pertinent old records I have discussed plan of care as described above with RN and patient/family.  CBC: Recent Labs  Lab 10/31/20 0728 11/01/20 0829 11/02/20 0509 11/03/20 0454 11/04/20 0445 11/05/20 0340 11/06/20 0338  WBC 12.5* 16.4* 14.8* 13.9* 15.9* 12.9* 12.5*  NEUTROABS 11.4* 14.9* 13.4* 12.4* 14.2*  --   --   HGB 18.5* 18.4* 17.5* 18.3* 16.8 16.5 16.5  HCT 55.2* 54.2* 51.3 54.8* 49.2 48.5 48.1  MCV 93.2 91.1 90.2 93.2 92.7 92.6 91.4  PLT 293 290 265 252 218 191 [id number]   Basic Metabolic Panel: Recent Labs  Lab 10/31/20 0728 11/01/20 0829 11/02/20 0509 11/03/20 0454 11/04/20 0445 11/05/20 0340 11/06/20 0338  NA 136 137 136 134* 136 136 135  K 4.6 4.3 4.7 4.9 5.0 5.1 4.5  CL 100 101 103 101 101 100 102  CO2 25 27 24 25 25 29 24    GLUCOSE 314* 240* 308* 254* 287* 196* 276*  BUN 50* 49* 60* 51* 49* 46* 41*  CREATININE 1.08 1.14 1.13 0.92 1.15 1.06 0.95  CALCIUM 8.9 9.0 8.7* 8.8* 8.7* 8.4* 8.2*  MG 2.4 2.3 2.5* 2.4 2.3  --   --   PHOS 4.0 4.3 4.4 4.3 3.9  --   --     Studies: No results found.  Scheduled Meds:  albuterol  2 puff Inhalation TID   atorvastatin  20 mg Oral Daily   diltiazem  120 mg Oral Daily   enoxaparin (LOVENOX) injection  40 mg Subcutaneous Q24H   insulin aspart  0-15 Units Subcutaneous TID WC   insulin aspart  0-5 Units Subcutaneous QHS   insulin glargine  10 Units Subcutaneous BID   methylPREDNISolone (SOLU-MEDROL) injection  40 mg Intravenous BID   [START ON 11/07/2020] methylPREDNISolone (SOLU-MEDROL) injection  60 mg Intravenous Daily   Followed by   Derrill Memo ON 11/09/2020] predniSONE  50 mg Oral Q breakfast   pantoprazole  40 mg Oral BID AC   sodium chloride flush  10-40 mL Intracatheter Q12H   Continuous Infusions: PRN Meds: acetaminophen, guaiFENesin-dextromethorphan, ondansetron **OR** ondansetron (ZOFRAN) IV, sodium chloride, sodium chloride flush  Time  spent: 35 minutes  Author: Berle Mull, MD [facility] 11/06/2020 7:31 PM  To reach On-call, see care teams to locate the attending and reach out via [email or web address]. Between 7PM-7AM, please contact night-coverage If you still have difficulty reaching the attending provider, please page the [facility] (Director on Call) for [facility] on amion for assistance.

## 2020-11-06 NOTE — Plan of Care (Signed)

## 2020-11-06 NOTE — Care Management Important Message (Signed)
Important Message  Patient Details IM Letter given to the Patient. Name: Lawrence [name] MRN: [id number] Date of Birth: October 05, 1951   Medicare Important Message Given:  Yes     Caren Macadam 11/06/2020, 12:12 PM

## 2020-11-07 LAB — GLUCOSE, CAPILLARY
Glucose-Capillary: 155 mg/dL — ABNORMAL HIGH (ref 70–99)
Glucose-Capillary: 238 mg/dL — ABNORMAL HIGH (ref 70–99)
Glucose-Capillary: 187 mg/dL — ABNORMAL HIGH (ref 70–99)
Glucose-Capillary: 109 mg/dL — ABNORMAL HIGH (ref 70–99)

## 2020-11-07 NOTE — Plan of Care (Signed)

## 2020-11-07 NOTE — Progress Notes (Signed)
Physical Therapy Treatment Patient Details Name: Lawrence [name] MRN: [id number] DOB: [birth year] Today's Date: 11/07/2020    History of Present Illness 69 yo male admitted with COVID Pna, possible PE. Hx of aortic stenosis, Afib, AA.    PT Comments    Pt agreeable to working with PT but he did not feel up to standing or attempting any walking- "Im too weak." Explained purpose of therapy visits and encouraged him to do what he could. He sat EOB and performed seated exercises before performing a few lateral scoots towards the HOB. He returned to supine. O2 85% on HHFNC and O2 mask with activity. EOS: O2 90%. Will continue to follow and progress activity as tolerated.     Follow Up Recommendations  SNF;Home health PT (depending on progress)     Equipment Recommendations  None recommended by PT    Recommendations for Other Services       Precautions / Restrictions Precautions Precautions: Fall Precaution Comments: airborne; HHFNC + O2 mask Restrictions Weight Bearing Restrictions: No    Mobility  Bed Mobility Overal bed mobility: Needs Assistance Bed Mobility: Supine to Sit;Sit to Supine     Supine to sit: HOB elevated;Supervision Sit to supine: HOB elevated;Supervision   General bed mobility comments: Supervision for safety, lines.  Transfers Overall transfer level: Needs assistance   Transfers: Lateral/Scoot Transfers          Lateral/Scoot Transfers: Min guard General transfer comment: Pt declined standing. He did perform a few lateral scoots towards the HOB.  Ambulation/Gait                 Stairs             Wheelchair Mobility    Modified Rankin (Stroke Patients Only)       Balance                                            Cognition Arousal/Alertness: Awake/alert Behavior During Therapy: WFL for tasks assessed/performed Overall Cognitive Status: Within Functional Limits for tasks assessed                                         Exercises General Exercises - Lower Extremity Long Arc Quad: AROM;Both;10 reps;Seated Hip Flexion/Marching: AAROM;Both;Seated (pt uses UEs to assist)    General Comments        Pertinent Vitals/Pain Pain Assessment: No/denies pain    Home Living                      Prior Function            PT Goals (current goals can now be found in the care plan section) Progress towards PT goals: Progressing toward goals    Frequency    Min 3X/week      PT Plan Current plan remains appropriate    Co-evaluation              AM-PAC PT "6 Clicks" Mobility   Outcome Measure  Help needed turning from your back to your side while in a flat bed without using bedrails?: A Little Help needed moving from lying on your back to sitting on the side of a flat bed without using bedrails?: A Little Help needed moving to and  from a bed to a chair (including a wheelchair)?: A Little Help needed standing up from a chair using your arms (e.g., wheelchair or bedside chair)?: A Little Help needed to walk in hospital room?: A Little Help needed climbing 3-5 steps with a railing? : A Lot 6 Click Score: 17    End of Session Equipment Utilized During Treatment: Oxygen Activity Tolerance: Patient tolerated treatment well Patient left: in bed;with call bell/phone within reach;with bed alarm set   PT Visit Diagnosis: Muscle weakness (generalized) (M62.81);Difficulty in walking, not elsewhere classified (R26.2)     Time: [id number] PT Time Calculation (min) (ACUTE ONLY): 21 min  Charges:  $Therapeutic Exercise: 8-22 mins                         Doreatha Massed, PT Acute Rehabilitation  Office: [phone] Pager: [phone]

## 2020-11-07 NOTE — Progress Notes (Signed)
Observed patient's oxygen saturation down to 80%. Patient is not wearing NRB, but has HHFNC in place. He is on phone ordering his breakfast. Through the door, I tried to get patient to put NRB on. Patient angrily shouted "I'm on the phone!" As patient is alert, oriented, and able to make poor choices, RN did not persist. Patient documentation this admission has detailed his self-determined decision-making that at times does not correlate with best practice.

## 2020-11-07 NOTE — Progress Notes (Signed)
[facility] Progress Note  Patient: Lawrence [name]    [id number]  DOA: 10/31/2020     Date of Service: the patient was seen and examined on 11/07/2020  Brief hospital course: Paroxysmal A. fib, aneurysm.  Presented with complaints of COVID-19 pneumonia and hypoxia. Currently plan is continue supportive care.  Assessment and Plan: 1.  Acute hypoxic respiratory failure, POA 88% on room air on admission Acute COVID-19 Viral Pneumonia CXR: hazy bilateral peripheral opacities Oxygen requirement: Continuing on heated high flow at 55 LPM, intermittently needing NRB. CRP: Normal now Remdesivir: Completed course Steroids: 40 mg twice daily Solu-Medrol, taper initiated. Baricitinib/Actemra(off-label use): Patient refused The investigational nature of this medication was discussed with the patient/HCPOA and they choose to proceed as the potential benefits are felt to outweigh risks at this time.  Antibiotics: None Vitamin C and Zinc: Continue DVT Prophylaxis: enoxaparin (LOVENOX) injection 40 mg Start: 10/31/20 2200 SCDs Start: 10/22/2020 2204 Prone positioning and incentive spirometer use recommended.  The treatment plan and use of medications and known side effects were discussed with patient/family. It was clearly explained that Complete risks and long-term side effects are unknown, however in the best clinical judgment they seem to be of some clinical benefit rather than medical risks. Patient/family agree with the treatment plan and want to receive these treatments as indicated.   2.  QT prolongation Monitor.  Currently improving.  3.  Capacity evaluation Psychiatry consulted. Patient does have capacity to make medical decisions.  4.  D-dimer elevation Patient was on therapeutic anticoagulation. Lower extremity Doppler negative. PCCM was consulted and recommended to discontinue anticoagulation if the lower extremity Doppler is is negative.  Back to DVT prophylaxis.  5.   Chronic A. fib On Cardizem. Not on anticoagulation prior to admission. Currently does not want to go on anticoagulation.  Monitor.  6.  HLD Hold statin  7.  Goals of care conversation. Discussed with patient as well as sister. Patient not improving in terms of his oxygenation despite improvement in inflammation. At present remains at poor outcome risk from COVID-19 pneumonia. Patient was DNI based on conversation with palliative care. There is significantly low chance of patient surviving a cardiac arrest event given his severe respiratory distress without intubation. Should the patient get intubated significantly low chance of patient able to come off of the ventilator as well. Based on this information patient would like to transition to DNR/DNI. Goal remains to treat what is treatable within this limits. On 12/24 patient refused all his medication.  Explained in detail that what ever chance that he may have, to survive the hospital stay, will go completely zero if he does not take any medication.  He currently agrees to continue taking some of the medications but not all of them.  He prefers IV over pills.  Medications were modified and multivitamins were removed to help the patient minimize pill burden. Currently goal is to treat what is treatable.  8.  LFT elevation Currently improving.  Monitor.  Diet: Regular diet DVT Prophylaxis:   enoxaparin (LOVENOX) injection 40 mg Start: 10/31/20 2200 SCDs Start: 10/27/2020 2204    Advance goals of care discussion: Limited code  Family Communication: no family was present at bedside, at the time of interview.  Discussed with sister on the phone on 12/27.  Disposition:  Status is: Inpatient  Remains inpatient appropriate because:Inpatient level of care appropriate due to severity of illness   Dispo:  Patient From: Home  Planned Disposition: Home  Expected discharge  date: 11/13/2020  Medically stable for discharge: No  Subjective:  Frustrated with blood work and therefore refusing.  Also frustrated with weakness with difficulty going to the bathroom on his own.  No nausea no vomiting.  Physical Exam:  General: Appear in mild distress, no Rash; Oral Mucosa Clear, moist. no Abnormal Neck Mass Or lumps, Conjunctiva normal  Cardiovascular: S1 and S2 Present, no Murmur, Respiratory: increased respiratory effort, Bilateral Air entry present and bilateral  Crackles, no wheezes Abdomen: Bowel Sound present, Soft and no tenderness Extremities: trace Pedal edema Neurology: alert and oriented to time, place, and person affect appropriate. no new focal deficit Gait not checked due to patient safety concerns  Vitals:   11/07/20 0859 11/07/20 1116 11/07/20 1319 11/07/20 1452  BP:  123/82    Pulse:  65  83  Resp:  (!) 21  (!) 25  Temp:  97.8 F (36.6 C)    TempSrc:  Oral    SpO2: 97%  94% 91%  Weight:      Height:        Intake/Output Summary (Last 24 hours) at 11/07/2020 2027 Last data filed at 11/07/2020 1610 Gross per 24 hour  Intake 240 ml  Output 720 ml  Net -480 ml   Filed Weights   Nov 15, 2020 1322  Weight: 97.1 kg    Data Reviewed: I have personally reviewed and interpreted daily labs, tele strips, imagings as discussed above. I reviewed all nursing notes, pharmacy notes, vitals, pertinent old records I have discussed plan of care as described above with RN and patient/family.  CBC: Recent Labs  Lab 11/01/20 0829 11/02/20 0509 11/03/20 0454 11/04/20 0445 11/05/20 0340 11/06/20 0338  WBC 16.4* 14.8* 13.9* 15.9* 12.9* 12.5*  NEUTROABS 14.9* 13.4* 12.4* 14.2*  --   --   HGB 18.4* 17.5* 18.3* 16.8 16.5 16.5  HCT 54.2* 51.3 54.8* 49.2 48.5 48.1  MCV 91.1 90.2 93.2 92.7 92.6 91.4  PLT 290 265 252 218 191 177   Basic Metabolic Panel: Recent Labs  Lab 11/01/20 0829 11/02/20 0509 11/03/20 0454 11/04/20 0445 11/05/20 0340 11/06/20 0338  NA 137 136 134* 136 136 135  K 4.3 4.7 4.9 5.0 5.1 4.5  CL  101 103 101 101 100 102  CO2 27 24 25 25 29 24   GLUCOSE 240* 308* 254* 287* 196* 276*  BUN 49* 60* 51* 49* 46* 41*  CREATININE 1.14 1.13 0.92 1.15 1.06 0.95  CALCIUM 9.0 8.7* 8.8* 8.7* 8.4* 8.2*  MG 2.3 2.5* 2.4 2.3  --   --   PHOS 4.3 4.4 4.3 3.9  --   --     Studies: No results found.  Scheduled Meds:  albuterol  2 puff Inhalation TID   atorvastatin  20 mg Oral Daily   diltiazem  120 mg Oral Daily   enoxaparin (LOVENOX) injection  40 mg Subcutaneous Q24H   insulin aspart  0-15 Units Subcutaneous TID WC   insulin aspart  0-5 Units Subcutaneous QHS   insulin glargine  10 Units Subcutaneous BID   methylPREDNISolone (SOLU-MEDROL) injection  60 mg Intravenous Daily   Followed by   ON 11/09/2020] predniSONE  50 mg Oral Q breakfast   pantoprazole  40 mg Oral BID AC   sodium chloride flush  10-40 mL Intracatheter Q12H   Continuous Infusions: PRN Meds: acetaminophen, guaiFENesin-dextromethorphan, ondansetron **OR** ondansetron (ZOFRAN) IV, sodium chloride, sodium chloride flush  Time spent: 35 minutes  Author: 11/11/2020, MD [facility] 11/07/2020 8:27 PM  To reach On-call, see care teams to locate the attending and reach out via [email or web address]. Between 7PM-7AM, please contact night-coverage If you still have difficulty reaching the attending provider, please page the [facility] (Director on Call) for [facility] on amion for assistance.

## 2020-11-08 LAB — CBC
HCT: 52.4 % — ABNORMAL HIGH (ref 39.0–52.0)
Hemoglobin: 17.9 g/dL — ABNORMAL HIGH (ref 13.0–17.0)
MCH: 31 pg (ref 26.0–34.0)
MCHC: 34.2 g/dL (ref 30.0–36.0)
MCV: 90.8 fL (ref 80.0–100.0)
Platelets: 155 10*3/uL (ref 150–400)
RBC: 5.77 MIL/uL (ref 4.22–5.81)
RDW: 13.3 % (ref 11.5–15.5)
WBC: 20.5 10*3/uL — ABNORMAL HIGH (ref 4.0–10.5)
nRBC: 0 % (ref 0.0–0.2)

## 2020-11-08 LAB — COMPREHENSIVE METABOLIC PANEL
ALT: 84 U/L — ABNORMAL HIGH (ref 0–44)
AST: 29 U/L (ref 15–41)
Albumin: 2.8 g/dL — ABNORMAL LOW (ref 3.5–5.0)
Alkaline Phosphatase: 59 U/L (ref 38–126)
Anion gap: 8 (ref 5–15)
BUN: 40 mg/dL — ABNORMAL HIGH (ref 8–23)
CO2: 28 mmol/L (ref 22–32)
Calcium: 8.4 mg/dL — ABNORMAL LOW (ref 8.9–10.3)
Chloride: 101 mmol/L (ref 98–111)
Creatinine, Ser: 0.82 mg/dL (ref 0.61–1.24)
GFR, Estimated: >60 mL/min (ref >60)
Glucose, Bld: 77 mg/dL (ref 70–99)
Potassium: 4.2 mmol/L (ref 3.5–5.1)
Sodium: 137 mmol/L (ref 135–145)
Total Bilirubin: 1.5 mg/dL — ABNORMAL HIGH (ref 0.3–1.2)
Total Protein: 5.3 g/dL — ABNORMAL LOW (ref 6.5–8.1)

## 2020-11-08 LAB — C-REACTIVE PROTEIN: CRP: 0.6 mg/dL (ref <1.0)

## 2020-11-08 LAB — GLUCOSE, CAPILLARY
Glucose-Capillary: 75 mg/dL (ref 70–99)
Glucose-Capillary: 117 mg/dL — ABNORMAL HIGH (ref 70–99)
Glucose-Capillary: 269 mg/dL — ABNORMAL HIGH (ref 70–99)
Glucose-Capillary: 351 mg/dL — ABNORMAL HIGH (ref 70–99)

## 2020-11-08 MED ORDER — FUROSEMIDE 10 MG/ML IJ SOLN
20.0000 mg | Freq: Once | INTRAMUSCULAR | Status: AC
  Administered 2020-11-08: 17:00:00 20 mg via INTRAVENOUS
  Filled 2020-11-08: qty 2

## 2020-11-08 NOTE — Plan of Care (Signed)

## 2020-11-08 NOTE — Progress Notes (Signed)
PROGRESS NOTE    Lawrence [name]  [id number] DOB: Nov 20, 1950 DOA: 10/31/2020 PCP: Benita Stabile, MD   Brief Narrative: 69 year old male with history of paroxysmal atrial fibrillation admitted with acute hypoxic respiratory failure due to COVID-19 pneumonia.  Assessment & Plan:   Principal Problem:   Pneumonia due to COVID-19 virus Active Problems:   Atrial fibrillation, chronic (HCC)   Acute respiratory failure with hypoxia (HCC)   Hyperglycemia   Syncope and collapse   Anxiety state   #1 acute hypoxic respiratory failure present on admission secondary to COVID-19 pneumonia.  He was 88% on room air on admission.  He is currently on His chest x-ray showed hazy bilateral opacities.  He is on high flow oxygen with NRB.  He has finished a course of remdesivir.  Currently on Solu-Medrol.  Encourage patient to be prone and use incentive spirometer. Continue Solu-Medrol Lasix 20 mg IV once  #2 chronic atrial fibrillation on Cardizem.  Not on anticoagulation prior to admission to hospital.  #3 hyperlipidemia on statin at home  #4 elevated D-dimer lower extremity Dopplers were negative.  PCCM was consulted for this and recommended to DC anticoagulation if lower extremity Dopplers are negative.  #5 goals of care patient was DNR DNI.  Goal is to treat what is treatable.  #6 abnormal elevated LFTs improving.   Estimated body mass index is 29.85 kg/m as calculated from the following:   Height as of this encounter: 5\' 11"  (1.803 m).   Weight as of this encounter: 97.1 kg.  DVT prophylaxis: Lovenox Code Status: DNR Family Communication: None at bedside Disposition Plan:  Status is: Inpatient  Dispo:  Patient From: Home  Planned Disposition: Home  Expected discharge date: 11/13/2020  Medically stable for discharge: No    Consultants: PCCM  Procedures none  antimicrobials: none  Subjective: Patient resting in bed still on high flow oxygen on 40 L at 70% and heated high flow  nasal cannula with nonrebreather saturating at 99%.  Objective: Vitals:   11/08/20 0042 11/08/20 0500 11/08/20 0526 11/08/20 1105  BP:   124/87 123/80  Pulse:   74   Resp:   (!) 25   Temp:   98.4 F (36.9 C) 97.7 F (36.5 C)  TempSrc:   Oral Oral  SpO2: 93% (!) 87% 94% 99%  Weight:      Height:        Intake/Output Summary (Last 24 hours) at 11/08/2020 1338 Last data filed at 11/08/2020 0534 Gross per 24 hour  Intake --  Output 300 ml  Net -300 ml   Filed Weights   2020/10/31 1322  Weight: 97.1 kg    Examination:  General exam: Appears calm and comfortable  Respiratory system coarse breath sounds to auscultation. Respiratory effort normal. Cardiovascular system: S1 & S2 heard, RRR. No JVD, murmurs, rubs, gallops or clicks. No pedal edema. Gastrointestinal system: Abdomen is nondistended, soft and nontender. No organomegaly or masses felt. Normal bowel sounds heard. Central nervous system: Alert and oriented. No focal neurological deficits. Extremities: Symmetric 5 x 5 power. Skin: No rashes, lesions or ulcers Psychiatry: Judgement and insight appear normal. Mood & affect appropriate.     Data Reviewed: I have personally reviewed following labs and imaging studies  CBC: Recent Labs  Lab 11/02/20 0509 11/03/20 0454 11/04/20 0445 11/05/20 0340 11/06/20 0338 11/08/20 0424  WBC 14.8* 13.9* 15.9* 12.9* 12.5* 20.5*  NEUTROABS 13.4* 12.4* 14.2*  --   --   --   HGB 17.5*  18.3* 16.8 16.5 16.5 17.9*  HCT 51.3 54.8* 49.2 48.5 48.1 52.4*  MCV 90.2 93.2 92.7 92.6 91.4 90.8  PLT 265 252 218 191 177 155   Basic Metabolic Panel: Recent Labs  Lab 11/02/20 0509 11/03/20 0454 11/04/20 0445 11/05/20 0340 11/06/20 0338 11/08/20 0424  NA 136 134* 136 136 135 137  K 4.7 4.9 5.0 5.1 4.5 4.2  CL 103 101 101 100 102 101  CO2 24 25 25 29 24 28   GLUCOSE 308* 254* 287* 196* 276* 77  BUN 60* 51* 49* 46* 41* 40*  CREATININE 1.13 0.92 1.15 1.06 0.95 0.82  CALCIUM 8.7* 8.8*  8.7* 8.4* 8.2* 8.4*  MG 2.5* 2.4 2.3  --   --   --   PHOS 4.4 4.3 3.9  --   --   --    GFR: Estimated Creatinine Clearance: 102.4 mL/min (by C-G formula based on SCr of 0.82 mg/dL). Liver Function Tests: Recent Labs  Lab 11/03/20 0454 11/04/20 0445 11/05/20 0340 11/06/20 0338 11/08/20 0424  AST 78* 35 28 27 29   ALT 178* 126* 101* 89* 84*  ALKPHOS 56 60 55 56 59  BILITOT 1.2 1.1 1.3* 1.0 1.5*  PROT 5.8* 5.2* 5.0* 4.9* 5.3*  ALBUMIN 2.8* 2.6* 2.5* 2.4* 2.8*   No results for input(s): LIPASE, AMYLASE in the last 168 hours. No results for input(s): AMMONIA in the last 168 hours. Coagulation Profile: No results for input(s): INR, PROTIME in the last 168 hours. Cardiac Enzymes: No results for input(s): CKTOTAL, CKMB, CKMBINDEX, TROPONINI in the last 168 hours. BNP (last 3 results) No results for input(s): PROBNP in the last 8760 hours. HbA1C: No results for input(s): HGBA1C in the last 72 hours. CBG: Recent Labs  Lab 11/07/20 1112 11/07/20 1629 11/07/20 2109 11/08/20 0719 11/08/20 1102  GLUCAP 238* 187* 109* 75 117*   Lipid Profile: No results for input(s): CHOL, HDL, LDLCALC, TRIG, CHOLHDL, LDLDIRECT in the last 72 hours. Thyroid Function Tests: No results for input(s): TSH, T4TOTAL, FREET4, T3FREE, THYROIDAB in the last 72 hours. Anemia Panel: No results for input(s): VITAMINB12, FOLATE, FERRITIN, TIBC, IRON, RETICCTPCT in the last 72 hours. Sepsis Labs: No results for input(s): PROCALCITON, LATICACIDVEN in the last 168 hours.  No results found for this or any previous visit (from the past 240 hour(s)).       Radiology Studies: No results found.      Scheduled Meds: . albuterol  2 puff Inhalation TID  . atorvastatin  20 mg Oral Daily  . diltiazem  120 mg Oral Daily  . enoxaparin (LOVENOX) injection  40 mg Subcutaneous Q24H  . insulin aspart  0-15 Units Subcutaneous TID WC  . insulin aspart  0-5 Units Subcutaneous QHS  . insulin glargine  10 Units  Subcutaneous BID  . pantoprazole  40 mg Oral BID AC  . [START ON 11/09/2020] predniSONE  50 mg Oral Q breakfast  . sodium chloride flush  10-40 mL Intracatheter Q12H   Continuous Infusions:   LOS: 14 days     11/10/20, MD 11/08/2020, 1:38 PM

## 2020-11-09 ENCOUNTER — Ambulatory Visit (HOSPITAL_COMMUNITY): Payer: Medicare HMO

## 2020-11-09 DIAGNOSIS — J1282 Pneumonia due to coronavirus disease 2019: Secondary | ICD-10-CM | POA: Diagnosis not present

## 2020-11-09 DIAGNOSIS — U071 COVID-19: Secondary | ICD-10-CM | POA: Diagnosis not present

## 2020-11-09 LAB — CBC
HCT: 50.2 % (ref 39.0–52.0)
Hemoglobin: 16.8 g/dL (ref 13.0–17.0)
MCH: 30.9 pg (ref 26.0–34.0)
MCHC: 33.5 g/dL (ref 30.0–36.0)
MCV: 92.3 fL (ref 80.0–100.0)
Platelets: 131 10*3/uL — ABNORMAL LOW (ref 150–400)
RBC: 5.44 MIL/uL (ref 4.22–5.81)
RDW: 13.3 % (ref 11.5–15.5)
WBC: 11.6 10*3/uL — ABNORMAL HIGH (ref 4.0–10.5)
nRBC: 0 % (ref 0.0–0.2)

## 2020-11-09 LAB — COMPREHENSIVE METABOLIC PANEL
ALT: 61 U/L — ABNORMAL HIGH (ref 0–44)
AST: 20 U/L (ref 15–41)
Albumin: 2.4 g/dL — ABNORMAL LOW (ref 3.5–5.0)
Alkaline Phosphatase: 59 U/L (ref 38–126)
Anion gap: 11 (ref 5–15)
BUN: 38 mg/dL — ABNORMAL HIGH (ref 8–23)
CO2: 25 mmol/L (ref 22–32)
Calcium: 8.3 mg/dL — ABNORMAL LOW (ref 8.9–10.3)
Chloride: 99 mmol/L (ref 98–111)
Creatinine, Ser: 1.04 mg/dL (ref 0.61–1.24)
GFR, Estimated: >60 mL/min (ref >60)
Glucose, Bld: 152 mg/dL — ABNORMAL HIGH (ref 70–99)
Potassium: 4.6 mmol/L (ref 3.5–5.1)
Sodium: 135 mmol/L (ref 135–145)
Total Bilirubin: 1.3 mg/dL — ABNORMAL HIGH (ref 0.3–1.2)
Total Protein: 5.1 g/dL — ABNORMAL LOW (ref 6.5–8.1)

## 2020-11-09 LAB — GLUCOSE, CAPILLARY
Glucose-Capillary: 142 mg/dL — ABNORMAL HIGH (ref 70–99)
Glucose-Capillary: 178 mg/dL — ABNORMAL HIGH (ref 70–99)
Glucose-Capillary: 316 mg/dL — ABNORMAL HIGH (ref 70–99)
Glucose-Capillary: 246 mg/dL — ABNORMAL HIGH (ref 70–99)

## 2020-11-09 LAB — C-REACTIVE PROTEIN: CRP: 8.4 mg/dL — ABNORMAL HIGH (ref <1.0)

## 2020-11-09 NOTE — Progress Notes (Signed)
PROGRESS NOTE    Lawrence [name]  [id number] DOB: 07/12/1951 DOA: 01/15/20 PCP: Benita StabileHall, John Z, MD   Brief Narrative: 69 year old male with history of paroxysmal atrial fibrillation admitted with acute hypoxic respiratory failure due to COVID-19 pneumonia.  Assessment & Plan:   Principal Problem:   Pneumonia due to COVID-19 virus Active Problems:   Atrial fibrillation, chronic (HCC)   Acute respiratory failure with hypoxia (HCC)   Hyperglycemia   Syncope and collapse   Anxiety state   #1 acute hypoxic respiratory failure present on admission secondary to COVID-19 pneumonia.  He was 88% on room air on admission.  He is currently on 40 L o2 His chest x-ray showed hazy bilateral opacities.  He is on high flow oxygen with NRB.  He has finished a course of remdesivir.  Currently on Solu-Medrol.  Encourage patient to be prone and use incentive spirometer. Continue Solu-Medrol Lasix 20 mg IV once 12/29  #2 chronic atrial fibrillation on Cardizem.  Not on anticoagulation prior to admission to hospital.  #3 hyperlipidemia on statin at home  #4 elevated D-dimer lower extremity Dopplers were negative.  PCCM was consulted for this and recommended to DC anticoagulation if lower extremity Dopplers are negative.  #5 goals of care patient was DNR DNI.  Goal is to treat what is treatable.  #6 abnormal elevated LFTs improving.   Estimated body mass index is 29.85 kg/m as calculated from the following:   Height as of this encounter: 5\' 11"  (1.803 m).   Weight as of this encounter: 97.1 kg.  DVT prophylaxis: Lovenox Code Status: DNR Family Communication: None at bedside Disposition Plan:  Status is: Inpatient  Dispo:  Patient From: Home  Planned Disposition: Home  Expected discharge date: 11/13/2020  Medically stable for discharge: No    Consultants: PCCM  Procedures none  antimicrobials: none  Subjective: He is resting in bed eating breakfast  Encouraged to use  IS Objective: Vitals:   11/08/20 2123 11/09/20 0555 11/09/20 0939 11/09/20 1243  BP: 126/83 133/86  126/82  Pulse:  66  71  Resp: 20 15  18   Temp: 98.6 F (37 C) (!) 97.5 F (36.4 C)  (!) 97.5 F (36.4 C)  TempSrc: Oral Oral  Oral  SpO2: 94% (!) 86% (!) 89% (!) 86%  Weight:      Height:        Intake/Output Summary (Last 24 hours) at 11/09/2020 1440 Last data filed at 11/09/2020 0500 Gross per 24 hour  Intake 240 ml  Output 1000 ml  Net -760 ml   Filed Weights   Aug 07, 2020 1322  Weight: 97.1 kg    Examination:  General exam: Appears calm and comfortable  Respiratory system coarse breath sounds to auscultation. Respiratory effort normal. Cardiovascular system: S1 & S2 heard, RRR. No JVD, murmurs, rubs, gallops or clicks. No pedal edema. Gastrointestinal system: Abdomen is nondistended, soft and nontender. No organomegaly or masses felt. Normal bowel sounds heard. Central nervous system: Alert and oriented. No focal neurological deficits. Extremities: Symmetric 5 x 5 power. Skin: No rashes, lesions or ulcers Psychiatry: Judgement and insight appear normal. Mood & affect appropriate.     Data Reviewed: I have personally reviewed following labs and imaging studies  CBC: Recent Labs  Lab 11/03/20 0454 11/04/20 0445 11/05/20 0340 11/06/20 0338 11/08/20 0424 11/09/20 0439  WBC 13.9* 15.9* 12.9* 12.5* 20.5* 11.6*  NEUTROABS 12.4* 14.2*  --   --   --   --   HGB 18.3* 16.8  16.5 16.5 17.9* 16.8  HCT 54.8* 49.2 48.5 48.1 52.4* 50.2  MCV 93.2 92.7 92.6 91.4 90.8 92.3  PLT 252 218 191 177 155 131*   Basic Metabolic Panel: Recent Labs  Lab 11/03/20 0454 11/04/20 0445 11/05/20 0340 11/06/20 0338 11/08/20 0424 11/09/20 0439  NA 134* 136 136 135 137 135  K 4.9 5.0 5.1 4.5 4.2 4.6  CL 101 101 100 102 101 99  CO2 25 25 29 24 28 25   GLUCOSE 254* 287* 196* 276* 77 152*  BUN 51* 49* 46* 41* 40* 38*  CREATININE 0.92 1.15 1.06 0.95 0.82 1.04  CALCIUM 8.8* 8.7* 8.4*  8.2* 8.4* 8.3*  MG 2.4 2.3  --   --   --   --   PHOS 4.3 3.9  --   --   --   --    GFR: Estimated Creatinine Clearance: 80.8 mL/min (by C-G formula based on SCr of 1.04 mg/dL). Liver Function Tests: Recent Labs  Lab 11/04/20 0445 11/05/20 0340 11/06/20 0338 11/08/20 0424 11/09/20 0439  AST 35 28 27 29 20   ALT 126* 101* 89* 84* 61*  ALKPHOS 60 55 56 59 59  BILITOT 1.1 1.3* 1.0 1.5* 1.3*  PROT 5.2* 5.0* 4.9* 5.3* 5.1*  ALBUMIN 2.6* 2.5* 2.4* 2.8* 2.4*   No results for input(s): LIPASE, AMYLASE in the last 168 hours. No results for input(s): AMMONIA in the last 168 hours. Coagulation Profile: No results for input(s): INR, PROTIME in the last 168 hours. Cardiac Enzymes: No results for input(s): CKTOTAL, CKMB, CKMBINDEX, TROPONINI in the last 168 hours. BNP (last 3 results) No results for input(s): PROBNP in the last 8760 hours. HbA1C: No results for input(s): HGBA1C in the last 72 hours. CBG: Recent Labs  Lab 11/08/20 1102 11/08/20 1653 11/08/20 2120 11/09/20 0739 11/09/20 1239  GLUCAP 117* 269* 351* 142* 178*   Lipid Profile: No results for input(s): CHOL, HDL, LDLCALC, TRIG, CHOLHDL, LDLDIRECT in the last 72 hours. Thyroid Function Tests: No results for input(s): TSH, T4TOTAL, FREET4, T3FREE, THYROIDAB in the last 72 hours. Anemia Panel: No results for input(s): VITAMINB12, FOLATE, FERRITIN, TIBC, IRON, RETICCTPCT in the last 72 hours. Sepsis Labs: No results for input(s): PROCALCITON, LATICACIDVEN in the last 168 hours.  No results found for this or any previous visit (from the past 240 hour(s)).       Radiology Studies: No results found.      Scheduled Meds: . albuterol  2 puff Inhalation TID  . atorvastatin  20 mg Oral Daily  . diltiazem  120 mg Oral Daily  . enoxaparin (LOVENOX) injection  40 mg Subcutaneous Q24H  . insulin aspart  0-15 Units Subcutaneous TID WC  . insulin aspart  0-5 Units Subcutaneous QHS  . insulin glargine  10 Units  Subcutaneous BID  . pantoprazole  40 mg Oral BID AC  . predniSONE  50 mg Oral Q breakfast  . sodium chloride flush  10-40 mL Intracatheter Q12H   Continuous Infusions:   LOS: 15 days     11/11/20, MD 11/09/2020, 2:40 PM

## 2020-11-09 NOTE — Progress Notes (Signed)
Changed water bottle on heated high flow 02 system- uneventful.

## 2020-11-09 NOTE — Progress Notes (Signed)
Occupational Therapy Treatment Patient Details Name: Lawrence [name] MRN: [id number] DOB: June 29, 1951 Today's Date: 11/09/2020    History of present illness 69 yo male admitted with COVID Pna, possible PE. Hx of aortic stenosis, Afib, AA.   OT comments  Patient supine in bed on 40 L HHFNC and NRB. Patient reports exercising his arms and legs in bed. Patient required encouragement to perform mobility. Patient supervision for bed mobility and min assist to stand with RW. Patient unsteady holding onto walker and able to stand approximately 1 min at edge of bed. Patient does not tolerate activity well and wants to recover in supine. Patient's o2 sat dropped to 78% and recovered to mid 90s at rest. Patient not agreeable to standing again despite firm and persistent encouragement from therapist.Patient not agreeable to sit in recliner and as far as this therapist aware has not gotten out of bed except to use BSC. Therapist educated patient on the need for mobility, the need for upright positioning, prone positioning and activity OOB. Reports "Maybe tomorrow I'll get in the chair." Patient reports he doesn't "want to overdo it."  Patient has not made any progress towards goals due to his limited participation with therapy and goals down graded. Therapist recommends short term rehab at discharge if cardiopulmonary status improves and can be weaned to [facility] or LTAC if patient unable to be weaned from [facility].    Follow Up Recommendations  SNF;LTACH    Equipment Recommendations  Tub/shower seat    Recommendations for Other Services      Precautions / Restrictions Precautions Precautions: Fall Precaution Comments: airborne; 40 L HHFNC + NRB Restrictions Weight Bearing Restrictions: No       Mobility Bed Mobility Overal bed mobility: Needs Assistance Bed Mobility: Supine to Sit;Sit to Supine     Supine to sit: HOB elevated;Supervision Sit to supine: HOB elevated;Supervision   General bed mobility  comments: Supervision and assistance for lines/leads.  Transfers Overall transfer level: Needs assistance Equipment used: Rolling walker (2 wheeled)   Sit to Stand: Min assist;From elevated surface         General transfer comment: Min assist to stand from elevated bed height while holding onto walker. Only able to stand approx 1 minute before needing to sit. O2 sat dropped to 78%. Patient did not tolerate recover at edge of bed and wanted to return to supine to recover.    Balance Overall balance assessment: Needs assistance Sitting-balance support: No upper extremity supported;Feet supported Sitting balance-Leahy Scale: Fair     Standing balance support: During functional activity;Bilateral upper extremity supported Standing balance-Leahy Scale: Fair                             ADL either performed or assessed with clinical judgement   ADL                                               Vision       Perception     Praxis      Cognition Arousal/Alertness: Awake/alert Behavior During Therapy: WFL for tasks assessed/performed Overall Cognitive Status: Within Functional Limits for tasks assessed  Exercises     Shoulder Instructions       General Comments      Pertinent Vitals/ Pain       Pain Assessment: No/denies pain  Home Living                                          Prior Functioning/Environment              Frequency  Min 2X/week        Progress Toward Goals  OT Goals(current goals can now be found in the care plan section)  Progress towards OT goals: Goals drowngraded-see care plan (Poor participation and unrealistic expectations of recovery without participation in movement/therapy.)  Acute Rehab OT Goals Patient Stated Goal: To take a shower OT Goal Formulation: With patient Time For Goal Achievement: 11/14/20 Potential to  Achieve Goals: [place] Discharge plan needs to be updated    Co-evaluation          OT goals addressed during session:  (functional mobility, activity tolerance)      AM-PAC OT "6 Clicks" Daily Activity     Outcome Measure   Help from another person eating meals?: None Help from another person taking care of personal grooming?: A Little Help from another person toileting, which includes using toliet, bedpan, or urinal?: A Lot Help from another person bathing (including washing, rinsing, drying)?: A Lot Help from another person to put on and taking off regular upper body clothing?: A Little Help from another person to put on and taking off regular lower body clothing?: A Lot 6 Click Score: 16    End of Session Equipment Utilized During Treatment: Oxygen;Rolling walker  OT Visit Diagnosis: Other abnormalities of gait and mobility (R26.89);Muscle weakness (generalized) (M62.81)   Activity Tolerance Patient limited by fatigue   Patient Left in bed;with call bell/phone within reach;with bed alarm set   Nurse Communication Mobility status;Other (comment) (Poor participation, need to get OOB)        Time: [id number] OT Time Calculation (min): 12 min  Charges: OT General Charges $OT Visit: 1 Visit OT Treatments $Therapeutic Activity: 8-22 mins  Derl Barrow, OTR/[facility] [place]  Office [phone] Pager: [place] 11/09/2020, 12:27 PM

## 2020-11-10 DIAGNOSIS — M545 Low back pain, unspecified: Secondary | ICD-10-CM | POA: Diagnosis not present

## 2020-11-10 DIAGNOSIS — Z86010 Personal history of colonic polyps: Secondary | ICD-10-CM | POA: Diagnosis not present

## 2020-11-10 DIAGNOSIS — J1282 Pneumonia due to coronavirus disease 2019: Secondary | ICD-10-CM | POA: Diagnosis not present

## 2020-11-10 DIAGNOSIS — I480 Paroxysmal atrial fibrillation: Secondary | ICD-10-CM | POA: Diagnosis not present

## 2020-11-10 DIAGNOSIS — I712 Thoracic aortic aneurysm, without rupture: Secondary | ICD-10-CM | POA: Diagnosis not present

## 2020-11-10 DIAGNOSIS — I10 Essential (primary) hypertension: Secondary | ICD-10-CM | POA: Diagnosis not present

## 2020-11-10 DIAGNOSIS — Z8739 Personal history of other diseases of the musculoskeletal system and connective tissue: Secondary | ICD-10-CM | POA: Diagnosis not present

## 2020-11-10 DIAGNOSIS — R7301 Impaired fasting glucose: Secondary | ICD-10-CM | POA: Diagnosis not present

## 2020-11-10 DIAGNOSIS — R945 Abnormal results of liver function studies: Secondary | ICD-10-CM | POA: Diagnosis not present

## 2020-11-10 DIAGNOSIS — E782 Mixed hyperlipidemia: Secondary | ICD-10-CM | POA: Diagnosis not present

## 2020-11-10 DIAGNOSIS — U071 COVID-19: Secondary | ICD-10-CM | POA: Diagnosis not present

## 2020-11-10 LAB — GLUCOSE, CAPILLARY
Glucose-Capillary: 89 mg/dL (ref 70–99)
Glucose-Capillary: 182 mg/dL — ABNORMAL HIGH (ref 70–99)
Glucose-Capillary: 179 mg/dL — ABNORMAL HIGH (ref 70–99)
Glucose-Capillary: 309 mg/dL — ABNORMAL HIGH (ref 70–99)

## 2020-11-10 LAB — C-REACTIVE PROTEIN: CRP: 3.1 mg/dL — ABNORMAL HIGH (ref <1.0)

## 2020-11-10 MED ORDER — METHYLPREDNISOLONE SODIUM SUCC 125 MG IJ SOLR
60.0000 mg | Freq: Four times a day (QID) | INTRAMUSCULAR | Status: DC
  Administered 2020-11-10 – 2020-11-14 (×14): 60 mg via INTRAVENOUS
  Filled 2020-11-10 (×14): qty 2

## 2020-11-10 NOTE — Progress Notes (Signed)
PROGRESS NOTE    Lawrence [name]  [id number] DOB: Oct 02, 1951 DOA: 10/15/2020 PCP: [name], MD   Brief Narrative: 69 year old male with history of paroxysmal atrial fibrillation admitted with acute hypoxic respiratory failure due to COVID-19 pneumonia.  Assessment & Plan:   Principal Problem:   Pneumonia due to COVID-19 virus Active Problems:   Atrial fibrillation, chronic (HCC)   Acute respiratory failure with hypoxia (HCC)   Hyperglycemia   Syncope and collapse   Anxiety state   #1 acute hypoxic respiratory failure present on admission secondary to COVID-19 pneumonia.  He was 88% on room air on admission.  He is currently on 40 L at 35% o2 His chest x-ray showed hazy bilateral opacities.  He has finished his course of remdesivir.  His oxygen saturation still has not improved.  He is currently on prednisone.  I will restart Solu-Medrol 60 mg every 6 and DC prednisone. Encourage patient to be prone and use incentive spirometer. Continue Solu-Medrol Lasix 20 mg IV once 12/29 CT chest today since he has not made any improvement.  #2 chronic atrial fibrillation on Cardizem.  Not on anticoagulation prior to admission to hospital.  #3 hyperlipidemia on statin at home  #4 elevated D-dimer lower extremity Dopplers were negative.  PCCM was consulted for this and recommended to DC anticoagulation if lower extremity Dopplers are negative.  #5 goals of care patient was DNR DNI.  Goal is to treat what is treatable.  #6 abnormal elevated LFTs improving.  #7 DM - continue lantus CBG (last 3)  Recent Labs    11/09/20 2152 11/10/20 0810 11/10/20 1250  GLUCAP 246* 89 182*      Estimated body mass index is 29.85 kg/m as calculated from the following:   Height as of this encounter: 5\' 11"  (1.803 m).   Weight as of this encounter: 97.1 kg.  DVT prophylaxis: Lovenox Code Status: DNR Family Communication: Discussed with family sister  Disposition Plan:  Status is:  Inpatient  Dispo:  Patient From: Home  Planned Disposition: Home  Expected discharge date: 11/17/2020  Medically stable for discharge: No    Consultants: PCCM  Procedures none  antimicrobials: none  Subjective:   Objective: Vitals:   11/10/20 0500 11/10/20 0655 11/10/20 0923 11/10/20 1150  BP:   125/70 133/75  Pulse:   65 65  Resp:   (!) 24 (!) 25  Temp:  98.3 F (36.8 C)    TempSrc:  Oral    SpO2: 93%  95% 94%  Weight:      Height:        Intake/Output Summary (Last 24 hours) at 11/10/2020 1324 Last data filed at 11/10/2020 0437 Gross per 24 hour  Intake 370 ml  Output 1050 ml  Net -680 ml   Filed Weights   10/12/2020 1322  Weight: 97.1 kg    Examination:  General exam: Appears calm and comfortable  Respiratory system coarse breath sounds to auscultation. Respiratory effort normal. Cardiovascular system: S1 & S2 heard, RRR. No JVD, murmurs, rubs, gallops or clicks. No pedal edema. Gastrointestinal system: Abdomen is nondistended, soft and nontender. No organomegaly or masses felt. Normal bowel sounds heard. Central nervous system: Alert and oriented. No focal neurological deficits. Extremities: Symmetric 5 x 5 power. Skin: No rashes, lesions or ulcers Psychiatry: Judgement and insight appear normal. Mood & affect appropriate.     Data Reviewed: I have personally reviewed following labs and imaging studies  CBC: Recent Labs  Lab 11/04/20 0445 11/05/20 0340  11/06/20 0338 11/08/20 0424 11/09/20 0439  WBC 15.9* 12.9* 12.5* 20.5* 11.6*  NEUTROABS 14.2*  --   --   --   --   HGB 16.8 16.5 16.5 17.9* 16.8  HCT 49.2 48.5 48.1 52.4* 50.2  MCV 92.7 92.6 91.4 90.8 92.3  PLT 218 191 177 155 131*   Basic Metabolic Panel: Recent Labs  Lab 11/04/20 0445 11/05/20 0340 11/06/20 0338 11/08/20 0424 11/09/20 0439  NA 136 136 135 137 135  K 5.0 5.1 4.5 4.2 4.6  CL 101 100 102 101 99  CO2 25 29 24 28 25   GLUCOSE 287* 196* 276* 77 152*  BUN 49* 46* 41* 40*  38*  CREATININE 1.15 1.06 0.95 0.82 1.04  CALCIUM 8.7* 8.4* 8.2* 8.4* 8.3*  MG 2.3  --   --   --   --   PHOS 3.9  --   --   --   --    GFR: Estimated Creatinine Clearance: 80.8 mL/min (by C-G formula based on SCr of 1.04 mg/dL). Liver Function Tests: Recent Labs  Lab 11/04/20 0445 11/05/20 0340 11/06/20 0338 11/08/20 0424 11/09/20 0439  AST 35 28 27 29 20   ALT 126* 101* 89* 84* 61*  ALKPHOS 60 55 56 59 59  BILITOT 1.1 1.3* 1.0 1.5* 1.3*  PROT 5.2* 5.0* 4.9* 5.3* 5.1*  ALBUMIN 2.6* 2.5* 2.4* 2.8* 2.4*   No results for input(s): LIPASE, AMYLASE in the last 168 hours. No results for input(s): AMMONIA in the last 168 hours. Coagulation Profile: No results for input(s): INR, PROTIME in the last 168 hours. Cardiac Enzymes: No results for input(s): CKTOTAL, CKMB, CKMBINDEX, TROPONINI in the last 168 hours. BNP (last 3 results) No results for input(s): PROBNP in the last 8760 hours. HbA1C: No results for input(s): HGBA1C in the last 72 hours. CBG: Recent Labs  Lab 11/09/20 1239 11/09/20 1655 11/09/20 2152 11/10/20 0810 11/10/20 1250  GLUCAP 178* 316* 246* 89 182*   Lipid Profile: No results for input(s): CHOL, HDL, LDLCALC, TRIG, CHOLHDL, LDLDIRECT in the last 72 hours. Thyroid Function Tests: No results for input(s): TSH, T4TOTAL, FREET4, T3FREE, THYROIDAB in the last 72 hours. Anemia Panel: No results for input(s): VITAMINB12, FOLATE, FERRITIN, TIBC, IRON, RETICCTPCT in the last 72 hours. Sepsis Labs: No results for input(s): PROCALCITON, LATICACIDVEN in the last 168 hours.  No results found for this or any previous visit (from the past 240 hour(s)).       Radiology Studies: No results found.      Scheduled Meds: . albuterol  2 puff Inhalation TID  . atorvastatin  20 mg Oral Daily  . diltiazem  120 mg Oral Daily  . enoxaparin (LOVENOX) injection  40 mg Subcutaneous Q24H  . insulin aspart  0-15 Units Subcutaneous TID WC  . insulin aspart  0-5 Units  Subcutaneous QHS  . insulin glargine  10 Units Subcutaneous BID  . pantoprazole  40 mg Oral BID AC  . predniSONE  50 mg Oral Q breakfast  . sodium chloride flush  10-40 mL Intracatheter Q12H   Continuous Infusions:   LOS: 16 days     11/12/20, MD 11/10/2020, 1:24 PM

## 2020-11-11 ENCOUNTER — Inpatient Hospital Stay (HOSPITAL_COMMUNITY): Payer: Medicare HMO

## 2020-11-11 DIAGNOSIS — J1282 Pneumonia due to coronavirus disease 2019: Secondary | ICD-10-CM | POA: Diagnosis not present

## 2020-11-11 DIAGNOSIS — U071 COVID-19: Secondary | ICD-10-CM | POA: Diagnosis not present

## 2020-11-11 LAB — GLUCOSE, CAPILLARY
Glucose-Capillary: 221 mg/dL — ABNORMAL HIGH (ref 70–99)
Glucose-Capillary: 276 mg/dL — ABNORMAL HIGH (ref 70–99)
Glucose-Capillary: 276 mg/dL — ABNORMAL HIGH (ref 70–99)
Glucose-Capillary: 265 mg/dL — ABNORMAL HIGH (ref 70–99)

## 2020-11-11 LAB — COMPREHENSIVE METABOLIC PANEL
ALT: 55 U/L — ABNORMAL HIGH (ref 0–44)
AST: 19 U/L (ref 15–41)
Albumin: 2.4 g/dL — ABNORMAL LOW (ref 3.5–5.0)
Alkaline Phosphatase: 64 U/L (ref 38–126)
Anion gap: 8 (ref 5–15)
BUN: 38 mg/dL — ABNORMAL HIGH (ref 8–23)
CO2: 27 mmol/L (ref 22–32)
Calcium: 8.4 mg/dL — ABNORMAL LOW (ref 8.9–10.3)
Chloride: 102 mmol/L (ref 98–111)
Creatinine, Ser: 0.75 mg/dL (ref 0.61–1.24)
GFR, Estimated: >60 mL/min (ref >60)
Glucose, Bld: 283 mg/dL — ABNORMAL HIGH (ref 70–99)
Potassium: 4.2 mmol/L (ref 3.5–5.1)
Sodium: 137 mmol/L (ref 135–145)
Total Bilirubin: 0.8 mg/dL (ref 0.3–1.2)
Total Protein: 5.1 g/dL — ABNORMAL LOW (ref 6.5–8.1)

## 2020-11-11 LAB — CBC
HCT: 49 % (ref 39.0–52.0)
Hemoglobin: 16.7 g/dL (ref 13.0–17.0)
MCH: 31.6 pg (ref 26.0–34.0)
MCHC: 34.1 g/dL (ref 30.0–36.0)
MCV: 92.6 fL (ref 80.0–100.0)
Platelets: 110 10*3/uL — ABNORMAL LOW (ref 150–400)
RBC: 5.29 MIL/uL (ref 4.22–5.81)
RDW: 13.5 % (ref 11.5–15.5)
WBC: 7.8 10*3/uL (ref 4.0–10.5)
nRBC: 0 % (ref 0.0–0.2)

## 2020-11-11 LAB — C-REACTIVE PROTEIN: CRP: 2.6 mg/dL — ABNORMAL HIGH (ref <1.0)

## 2020-11-11 LAB — D-DIMER, QUANTITATIVE: D-Dimer, Quant: 1.12 ug/mL-FEU — ABNORMAL HIGH (ref 0.00–0.50)

## 2020-11-11 MED ORDER — INSULIN GLARGINE 100 UNIT/ML ~~LOC~~ SOLN
14.0000 [IU] | Freq: Two times a day (BID) | SUBCUTANEOUS | Status: DC
  Administered 2020-11-11 – 2020-11-17 (×12): 14 [IU] via SUBCUTANEOUS
  Filled 2020-11-11 (×12): qty 0.14

## 2020-11-11 NOTE — Progress Notes (Signed)
Patient states he first tested positive for covid 12/5 at  Dr.Halls office- if we can confirm we can take the pt off precautions and allow visitors.

## 2020-11-11 NOTE — Progress Notes (Signed)
PROGRESS NOTE    Lawrence OROURKE  VQQ:[id number] DOB: [birth year] DOA: 11/08/2020 PCP: Benita Stabile, MD   Brief Narrative: 70 year old male with history of paroxysmal atrial fibrillation admitted with acute hypoxic respiratory failure due to COVID-19 pneumonia.  Assessment & Plan:   Principal Problem:   Pneumonia due to COVID-19 virus Active Problems:   Atrial fibrillation, chronic (HCC)   Acute respiratory failure with hypoxia (HCC)   Hyperglycemia   Syncope and collapse   Anxiety state   #1 acute hypoxic respiratory failure present on admission secondary to COVID-19 pneumonia.  He was 88% on room air on admission.  He is currently on 40 L at 35% o2 His chest x-ray showed hazy bilateral opacities.  He has finished his course of remdesivir.  His oxygen saturation still has not improved.  He is currently on prednisone.  I will restart Solu-Medrol 60 mg every 6 and DC prednisone. Encourage patient to be prone and use incentive spirometer. Continue Solu-Medrol Lasix 20 mg IV once 12/29 Repeat chest x-ray 11/11/2020 He wants to get out of isolation reporting that he was tested positive in the beginning of December in Dr. Scharlene Gloss office. This is not documented in epic.  #2 chronic atrial fibrillation on Cardizem.  Not on anticoagulation prior to admission to hospital.  #3 hyperlipidemia on statin at home  #4 elevated D-dimer lower extremity Dopplers were negative.  PCCM was consulted for this and recommended to DC anticoagulation if lower extremity Dopplers are negative.  #5 goals of care patient was DNR DNI.  Goal is to treat what is treatable.  #6 abnormal elevated LFTs improving.  #7 DM - continue lantus increased dose of Lantus. CBG (last 3)  Recent Labs    11/10/20 2051 11/11/20 0755 11/11/20 1147  GLUCAP 309* 221* 276*      Estimated body mass index is 29.85 kg/m as calculated from the following:   Height as of this encounter: 5\' 11"  (1.803 m).   Weight as of this  encounter: 97.1 kg.  DVT prophylaxis: Lovenox Code Status: DNR Family Communication: Discussed with family sister  Disposition Plan:  Status is: Inpatient  Dispo:  Patient From: Home  Planned Disposition: Home  Expected discharge date: 11/17/2020  Medically stable for discharge: No    Consultants: PCCM  Procedures none  antimicrobials: none  Subjective: He was resting in the recliner Nurse reported that he is very weak and hypoxic with movement  Objective: Vitals:   11/11/20 0354 11/11/20 0500 11/11/20 0840 11/11/20 1422  BP:  137/76  133/88  Pulse: 65 67 81 81  Resp: (!) 22 (!) 21 (!) 26 20  Temp:  98 F (36.7 C)  97.6 F (36.4 C)  TempSrc:  Oral  Oral  SpO2: (!) 88% 93% 92% 90%  Weight:      Height:        Intake/Output Summary (Last 24 hours) at 11/11/2020 1514 Last data filed at 11/11/2020 0500 Gross per 24 hour  Intake 300 ml  Output 550 ml  Net -250 ml   Filed Weights   10/30/2020 1322  Weight: 97.1 kg    Examination:  General exam: Appears calm and comfortable  Respiratory system coarse breath sounds to auscultation. Respiratory effort normal. Cardiovascular system: S1 & S2 heard, RRR. No JVD, murmurs, rubs, gallops or clicks. No pedal edema. Gastrointestinal system: Abdomen is nondistended, soft and nontender. No organomegaly or masses felt. Normal bowel sounds heard. Central nervous system: Alert and oriented. No focal neurological deficits.  Extremities: Symmetric 5 x 5 power. Skin: No rashes, lesions or ulcers Psychiatry: Judgement and insight appear normal. Mood & affect appropriate.     Data Reviewed: I have personally reviewed following labs and imaging studies  CBC: Recent Labs  Lab 11/05/20 0340 11/06/20 0338 11/08/20 0424 11/09/20 0439 11/11/20 0518  WBC 12.9* 12.5* 20.5* 11.6* 7.8  HGB 16.5 16.5 17.9* 16.8 16.7  HCT 48.5 48.1 52.4* 50.2 49.0  MCV 92.6 91.4 90.8 92.3 92.6  PLT 191 177 155 131* [id number]*   Basic Metabolic Panel: Recent  Labs  Lab 11/05/20 0340 11/06/20 0338 11/08/20 0424 11/09/20 0439 11/11/20 0518  NA 136 135 137 135 137  K 5.1 4.5 4.2 4.6 4.2  CL 100 102 101 99 102  CO2 29 24 28 25 27   GLUCOSE 196* 276* 77 152* 283*  BUN 46* 41* 40* 38* 38*  CREATININE 1.06 0.95 0.82 1.04 0.75  CALCIUM 8.4* 8.2* 8.4* 8.3* 8.4*   GFR: Estimated Creatinine Clearance: 105 mL/min (by C-G formula based on SCr of 0.75 mg/dL). Liver Function Tests: Recent Labs  Lab 11/05/20 0340 11/06/20 0338 11/08/20 0424 11/09/20 0439 11/11/20 0518  AST 28 27 29 20 19   ALT 101* 89* 84* 61* 55*  ALKPHOS 55 56 59 59 64  BILITOT 1.3* 1.0 1.5* 1.3* 0.8  PROT 5.0* 4.9* 5.3* 5.1* 5.1*  ALBUMIN 2.5* 2.4* 2.8* 2.4* 2.4*   No results for input(s): LIPASE, AMYLASE in the last 168 hours. No results for input(s): AMMONIA in the last 168 hours. Coagulation Profile: No results for input(s): INR, PROTIME in the last 168 hours. Cardiac Enzymes: No results for input(s): CKTOTAL, CKMB, CKMBINDEX, TROPONINI in the last 168 hours. BNP (last 3 results) No results for input(s): PROBNP in the last 8760 hours. HbA1C: No results for input(s): HGBA1C in the last 72 hours. CBG: Recent Labs  Lab 11/10/20 1250 11/10/20 1639 11/10/20 2051 11/11/20 0755 11/11/20 1147  GLUCAP 182* 179* 309* 221* 276*   Lipid Profile: No results for input(s): CHOL, HDL, LDLCALC, TRIG, CHOLHDL, LDLDIRECT in the last 72 hours. Thyroid Function Tests: No results for input(s): TSH, T4TOTAL, FREET4, T3FREE, THYROIDAB in the last 72 hours. Anemia Panel: No results for input(s): VITAMINB12, FOLATE, FERRITIN, TIBC, IRON, RETICCTPCT in the last 72 hours. Sepsis Labs: No results for input(s): PROCALCITON, LATICACIDVEN in the last 168 hours.  No results found for this or any previous visit (from the past 240 hour(s)).       Radiology Studies: No results found.      Scheduled Meds: . albuterol  2 puff Inhalation TID  . atorvastatin  20 mg Oral Daily  .  diltiazem  120 mg Oral Daily  . enoxaparin (LOVENOX) injection  40 mg Subcutaneous Q24H  . insulin aspart  0-15 Units Subcutaneous TID WC  . insulin aspart  0-5 Units Subcutaneous QHS  . insulin glargine  10 Units Subcutaneous BID  . methylPREDNISolone (SOLU-MEDROL) injection  60 mg Intravenous Q6H  . pantoprazole  40 mg Oral BID AC  . sodium chloride flush  10-40 mL Intracatheter Q12H   Continuous Infusions:   LOS: 17 days     Georgette Shell, MD 11/11/2020, 3:14 PM

## 2020-11-11 DEATH — deceased

## 2020-11-12 DIAGNOSIS — J1282 Pneumonia due to coronavirus disease 2019: Secondary | ICD-10-CM | POA: Diagnosis not present

## 2020-11-12 DIAGNOSIS — U071 COVID-19: Secondary | ICD-10-CM | POA: Diagnosis not present

## 2020-11-12 LAB — COMPREHENSIVE METABOLIC PANEL
ALT: 52 U/L — ABNORMAL HIGH (ref 0–44)
AST: 19 U/L (ref 15–41)
Albumin: 2.6 g/dL — ABNORMAL LOW (ref 3.5–5.0)
Alkaline Phosphatase: 57 U/L (ref 38–126)
Anion gap: 8 (ref 5–15)
BUN: 42 mg/dL — ABNORMAL HIGH (ref 8–23)
CO2: 27 mmol/L (ref 22–32)
Calcium: 8.5 mg/dL — ABNORMAL LOW (ref 8.9–10.3)
Chloride: 103 mmol/L (ref 98–111)
Creatinine, Ser: 0.79 mg/dL (ref 0.61–1.24)
GFR, Estimated: >60 mL/min (ref >60)
Glucose, Bld: 202 mg/dL — ABNORMAL HIGH (ref 70–99)
Potassium: 4.5 mmol/L (ref 3.5–5.1)
Sodium: 138 mmol/L (ref 135–145)
Total Bilirubin: 0.9 mg/dL (ref 0.3–1.2)
Total Protein: 5 g/dL — ABNORMAL LOW (ref 6.5–8.1)

## 2020-11-12 LAB — CBC
HCT: 47.6 % (ref 39.0–52.0)
Hemoglobin: 16 g/dL (ref 13.0–17.0)
MCH: 31.4 pg (ref 26.0–34.0)
MCHC: 33.6 g/dL (ref 30.0–36.0)
MCV: 93.3 fL (ref 80.0–100.0)
Platelets: 112 10*3/uL — ABNORMAL LOW (ref 150–400)
RBC: 5.1 MIL/uL (ref 4.22–5.81)
RDW: 13.4 % (ref 11.5–15.5)
WBC: 11.8 10*3/uL — ABNORMAL HIGH (ref 4.0–10.5)
nRBC: 0 % (ref 0.0–0.2)

## 2020-11-12 LAB — GLUCOSE, CAPILLARY
Glucose-Capillary: 149 mg/dL — ABNORMAL HIGH (ref 70–99)
Glucose-Capillary: 235 mg/dL — ABNORMAL HIGH (ref 70–99)
Glucose-Capillary: 210 mg/dL — ABNORMAL HIGH (ref 70–99)
Glucose-Capillary: 341 mg/dL — ABNORMAL HIGH (ref 70–99)

## 2020-11-12 LAB — D-DIMER, QUANTITATIVE: D-Dimer, Quant: 0.91 ug/mL-FEU — ABNORMAL HIGH (ref 0.00–0.50)

## 2020-11-12 LAB — C-REACTIVE PROTEIN: CRP: 0.9 mg/dL (ref <1.0)

## 2020-11-12 NOTE — Progress Notes (Signed)
[facility] Progress Note  Patient: Lawrence [name]    XVQ:[id number]  DOA: 10-31-20     Date of Service: the patient was seen and examined on 11/12/2020  Brief hospital course: Paroxysmal A. fib, aneurysm.  Presented with complaints of COVID-19 pneumonia and hypoxia. Currently plan is continue supportive care.  Assessment and Plan: 1.  Acute hypoxic respiratory failure, POA 88% on room air on admission Acute COVID-19 Viral Pneumonia CXR: hazy bilateral peripheral opacities Oxygen requirement: Continuing on heated high flow at 40 LPM. CRP: Normal now Remdesivir: Completed course Steroids: Solu-Medrol, taper initiated. Baricitinib/Actemra(off-label use): Patient refused The investigational nature of this medication was discussed with the patient/HCPOA and they choose to proceed as the potential benefits are felt to outweigh risks at this time.  Antibiotics: None Vitamin C and Zinc: Continue DVT Prophylaxis: enoxaparin (LOVENOX) injection 40 mg Start: 10/31/20 2200 SCDs Start: Oct 31, 2020 2204 Prone positioning and incentive spirometer use recommended.  The treatment plan and use of medications and known side effects were discussed with patient/family. It was clearly explained that Complete risks and long-term side effects are unknown, however in the best clinical judgment they seem to be of some clinical benefit rather than medical risks. Patient/family agree with the treatment plan and want to receive these treatments as indicated.   2.  QT prolongation Monitor.  Currently improving.  3.  Capacity evaluation Psychiatry consulted. Patient does have capacity to make medical decisions.  4.  D-dimer elevation Patient was on therapeutic anticoagulation. Lower extremity Doppler negative. PCCM was consulted and recommended to discontinue anticoagulation if the lower extremity Doppler is is negative.  Back to DVT prophylaxis.  5.  Chronic A. fib On Cardizem. Not on  anticoagulation prior to admission. Currently does not want to go on anticoagulation.  Monitor.  6.  HLD Hold statin  7.  Goals of care conversation. Discussed with patient as well as sister. Currently goal is to treat what is treatable.  8.  LFT elevation Currently improving.  Monitor.  Diet: Regular diet DVT Prophylaxis:   enoxaparin (LOVENOX) injection 40 mg Start: 10/31/20 2200 SCDs Start: 10-31-2020 2204    Advance goals of care discussion: Limited code  Family Communication: no family was present at bedside, at the time of interview.  Discussed with sister on the phone on 12/27.  Disposition:  Status is: Inpatient  Remains inpatient appropriate because:Inpatient level of care appropriate due to severity of illness   Dispo:  Patient From: Home  Planned Disposition: Home  Expected discharge date: 11/17/2020  Medically stable for discharge: No  Subjective: No nausea no vomiting.  No fever no chills.  Physical Exam:  General: Appear in mild distress, no Rash; Oral Mucosa Clear, moist. no Abnormal Neck Mass Or lumps, Conjunctiva normal  Cardiovascular: S1 and S2 Present, no Murmur, Respiratory: increased respiratory effort, Bilateral Air entry present and bilateral  Crackles, no wheezes Abdomen: Bowel Sound present, Soft and no tenderness Extremities: trace Pedal edema Neurology: alert and oriented to time, place, and person affect appropriate. no new focal deficit Gait not checked due to patient safety concerns  Vitals:   11/12/20 0829 11/12/20 0902 11/12/20 1300 11/12/20 1503  BP:  118/78 123/75   Pulse: 71  81 69  Resp: (!) 24  (!) 21 17  Temp:   97.8 F (36.6 C)   TempSrc:   Oral   SpO2: 92%  (!) 85% (!) 86%  Weight:      Height:  Intake/Output Summary (Last 24 hours) at 11/12/2020 1821 Last data filed at 11/12/2020 1000 Gross per 24 hour  Intake 200 ml  Output 750 ml  Net -550 ml   Filed Weights   10/11/2020 1322  Weight: 97.1 kg    Data  Reviewed: I have personally reviewed and interpreted daily labs, tele strips, imagings as discussed above. I reviewed all nursing notes, pharmacy notes, vitals, pertinent old records I have discussed plan of care as described above with RN and patient/family.  CBC: Recent Labs  Lab 11/06/20 0338 11/08/20 0424 11/09/20 0439 11/11/20 0518 11/12/20 0453  WBC 12.5* 20.5* 11.6* 7.8 11.8*  HGB 16.5 17.9* 16.8 16.7 16.0  HCT 48.1 52.4* 50.2 49.0 47.6  MCV 91.4 90.8 92.3 92.6 93.3  PLT 177 155 131* 110* [id number]*   Basic Metabolic Panel: Recent Labs  Lab 11/06/20 0338 11/08/20 0424 11/09/20 0439 11/11/20 0518 11/12/20 0453  NA 135 137 135 137 138  K 4.5 4.2 4.6 4.2 4.5  CL 102 101 99 102 103  CO2 24 28 25 27 27   GLUCOSE 276* 77 152* 283* 202*  BUN 41* 40* 38* 38* 42*  CREATININE 0.95 0.82 1.04 0.75 0.79  CALCIUM 8.2* 8.4* 8.3* 8.4* 8.5*    Studies: No results found.  Scheduled Meds: . albuterol  2 puff Inhalation TID  . atorvastatin  20 mg Oral Daily  . diltiazem  120 mg Oral Daily  . enoxaparin (LOVENOX) injection  40 mg Subcutaneous Q24H  . insulin aspart  0-15 Units Subcutaneous TID WC  . insulin aspart  0-5 Units Subcutaneous QHS  . insulin glargine  14 Units Subcutaneous BID  . methylPREDNISolone (SOLU-MEDROL) injection  60 mg Intravenous Q6H  . pantoprazole  40 mg Oral BID AC  . sodium chloride flush  10-40 mL Intracatheter Q12H   Continuous Infusions: PRN Meds: acetaminophen, guaiFENesin-dextromethorphan, ondansetron **OR** ondansetron (ZOFRAN) IV, sodium chloride, sodium chloride flush  Time spent: 35 minutes  Author: Berle Mull, MD [facility] 11/12/2020 6:21 PM  To reach On-call, see care teams to locate the attending and reach out via [email or web address]. Between 7PM-7AM, please contact night-coverage If you still have difficulty reaching the attending provider, please page the [facility] (Director on Call) for [facility] on amion for assistance.

## 2020-11-13 DIAGNOSIS — U071 COVID-19: Secondary | ICD-10-CM | POA: Diagnosis not present

## 2020-11-13 DIAGNOSIS — J1282 Pneumonia due to coronavirus disease 2019: Secondary | ICD-10-CM | POA: Diagnosis not present

## 2020-11-13 LAB — COMPREHENSIVE METABOLIC PANEL
ALT: 53 U/L — ABNORMAL HIGH (ref 0–44)
AST: 19 U/L (ref 15–41)
Albumin: 2.2 g/dL — ABNORMAL LOW (ref 3.5–5.0)
Alkaline Phosphatase: 50 U/L (ref 38–126)
Anion gap: 6 (ref 5–15)
BUN: 45 mg/dL — ABNORMAL HIGH (ref 8–23)
CO2: 26 mmol/L (ref 22–32)
Calcium: 8.1 mg/dL — ABNORMAL LOW (ref 8.9–10.3)
Chloride: 105 mmol/L (ref 98–111)
Creatinine, Ser: 0.93 mg/dL (ref 0.61–1.24)
GFR, Estimated: >60 mL/min (ref >60)
Glucose, Bld: 176 mg/dL — ABNORMAL HIGH (ref 70–99)
Potassium: 4.3 mmol/L (ref 3.5–5.1)
Sodium: 137 mmol/L (ref 135–145)
Total Bilirubin: 0.9 mg/dL (ref 0.3–1.2)
Total Protein: 4.6 g/dL — ABNORMAL LOW (ref 6.5–8.1)

## 2020-11-13 LAB — CBC
HCT: 43.8 % (ref 39.0–52.0)
Hemoglobin: 15.1 g/dL (ref 13.0–17.0)
MCH: 31.7 pg (ref 26.0–34.0)
MCHC: 34.5 g/dL (ref 30.0–36.0)
MCV: 92 fL (ref 80.0–100.0)
Platelets: 101 10*3/uL — ABNORMAL LOW (ref 150–400)
RBC: 4.76 MIL/uL (ref 4.22–5.81)
RDW: 13.4 % (ref 11.5–15.5)
WBC: 12.9 10*3/uL — ABNORMAL HIGH (ref 4.0–10.5)
nRBC: 0 % (ref 0.0–0.2)

## 2020-11-13 LAB — GLUCOSE, CAPILLARY
Glucose-Capillary: 135 mg/dL — ABNORMAL HIGH (ref 70–99)
Glucose-Capillary: 300 mg/dL — ABNORMAL HIGH (ref 70–99)
Glucose-Capillary: 253 mg/dL — ABNORMAL HIGH (ref 70–99)
Glucose-Capillary: 222 mg/dL — ABNORMAL HIGH (ref 70–99)

## 2020-11-13 LAB — D-DIMER, QUANTITATIVE: D-Dimer, Quant: 0.92 ug/mL-FEU — ABNORMAL HIGH (ref 0.00–0.50)

## 2020-11-13 LAB — C-REACTIVE PROTEIN: CRP: 0.6 mg/dL (ref <1.0)

## 2020-11-13 NOTE — Progress Notes (Signed)
Inpatient Diabetes Program Recommendations  AACE/ADA: New Consensus Statement on Inpatient Glycemic Control (2015)  Target Ranges:  Prepandial:   less than 140 mg/dL      Peak postprandial:   less than 180 mg/dL (1-2 hours)      Critically ill patients:  140 - 180 mg/dL   Results for Lawrence, [name] (MRN [id number]) as of 11/13/2020 09:21  Ref. Range 11/12/2020 07:43 11/12/2020 11:34 11/12/2020 16:41 11/12/2020 20:31  Glucose-Capillary Latest Ref Range: 70 - 99 mg/dL 812 (H)  2 units NOVOLOG  14 units LANTUS  235 (H)  5 units NOVOLOG  210 (H)  5 units NOVOLOG  341 (H)  4 units NOVOLOG  14 units LANTUS    Results for Lawrence, [name] (MRN [id number]) as of 11/13/2020 09:21  Ref. Range 11/13/2020 07:44  Glucose-Capillary Latest Ref Range: 70 - 99 mg/dL 944 (H)  2 units NOVOLOG  14 units LANTUS   Results for Lawrence, [name] (MRN [id number]) as of 11/13/2020 09:21  Ref. Range Oct 29, 2020 21:30  Hemoglobin A1C Latest Ref Range: 4.8 - 5.6 % 6.1 (H)     Current Orders: Lantus 14 units BID      Novolog Moderate Correction Scale/ SSI (0-15 units) TID AC + HS    MD- Note patient getting Solumedrol 60 mg Q6 hours.  AM CBGs have been OK, however, afternoon CBGs have been >200  Please consider:  Start Novolog Meal Coverage: Novolog 4 units TID with meals   (Please add the following Hold Parameters: Hold if pt eats <50% of meal, Hold if pt NPO)     --Will follow patient during hospitalization--  Ambrose Finland RN, MSN, CDE Diabetes Coordinator Inpatient Glycemic Control Team Team Pager: [phone] (8a-5p)

## 2020-11-13 NOTE — Plan of Care (Signed)

## 2020-11-13 NOTE — Progress Notes (Signed)
[facility] Progress Note  Patient: Lawrence [name]    [id number]  DOA: 11/06/2020     Date of Service: the patient was seen and examined on 11/13/2020  Brief hospital course: Paroxysmal A. fib, aneurysm.  Presented with complaints of COVID-19 pneumonia and hypoxia. Currently plan is continue supportive care.  Assessment and Plan: 1.  Acute hypoxic respiratory failure, POA 88% on room air on admission Acute COVID-19 Viral Pneumonia CXR: hazy bilateral peripheral opacities Oxygen requirement: Continuing on heated high flow at 40 LPM. CRP: Normal now Remdesivir: Completed course Steroids: Solu-Medrol, taper initiated. Baricitinib/Actemra(off-label use): Patient refused The investigational nature of this medication was discussed with the patient/HCPOA and they choose to proceed as the potential benefits are felt to outweigh risks at this time.  Antibiotics: None Vitamin C and Zinc: Continue DVT Prophylaxis: enoxaparin (LOVENOX) injection 40 mg Start: 10/31/20 2200 SCDs Start: 10/20/2020 2204 Prone positioning and incentive spirometer use recommended.  Repeat Covid test on 11/15/2020, depending on the cycle threshold value will decide on isolation continuation or not.  The treatment plan and use of medications and known side effects were discussed with patient/family. It was clearly explained that Complete risks and long-term side effects are unknown, however in the best clinical judgment they seem to be of some clinical benefit rather than medical risks. Patient/family agree with the treatment plan and want to receive these treatments as indicated.   2.  QT prolongation Monitor.  Currently improving.  3.  Capacity evaluation Psychiatry consulted. Patient does have capacity to make medical decisions.  4.  D-dimer elevation Patient was on therapeutic anticoagulation. Lower extremity Doppler negative. PCCM was consulted and recommended to discontinue anticoagulation if the  lower extremity Doppler is is negative.  Back to DVT prophylaxis.  5.  Chronic A. fib On Cardizem. Not on anticoagulation prior to admission. Currently does not want to go on anticoagulation.  Monitor.  6.  HLD Hold statin  7.  Goals of care conversation. Discussed with patient as well as sister. Currently goal is to treat what is treatable.  8.  LFT elevation Currently improving.  Monitor.  Diet: Regular diet DVT Prophylaxis:   enoxaparin (LOVENOX) injection 40 mg Start: 10/31/20 2200 SCDs Start: 10/19/2020 2204    Advance goals of care discussion: Limited code  Family Communication: no family was present at bedside, at the time of interview.  Discussed with sister on the phone.  Disposition:  Status is: Inpatient  Remains inpatient appropriate because:Inpatient level of care appropriate due to severity of illness   Dispo:  Patient From: Home  Planned Disposition: Home  Expected discharge date: 11/17/2020  Medically stable for discharge: No  Subjective: No acute event.  No nausea no vomiting but no fever no chills.  Physical Exam:  General: Appear in mild distress, no Rash; Oral Mucosa Clear, moist. no Abnormal Neck Mass Or lumps, Conjunctiva normal  Cardiovascular: S1 and S2 Present, no Murmur, Respiratory: increased respiratory effort, Bilateral Air entry present and bilateral  Crackles, no wheezes Abdomen: Bowel Sound present, Soft and no tenderness Extremities: trace Pedal edema Neurology: alert and oriented to time, place, and person affect appropriate. no new focal deficit Gait not checked due to patient safety concerns    Vitals:   11/13/20 1130 11/13/20 1300 11/13/20 2000 11/13/20 2044  BP: 114/70  114/69 118/70  Pulse:  70 73   Resp:  (!) 21 (!) 22   Temp: 97.8 F (36.6 C)  97.6 F (36.4 C) 98.7 F (37.1 C)  TempSrc: Oral  Oral Oral  SpO2:  (!) 87% (!) 88%   Weight:      Height:        Intake/Output Summary (Last 24 hours) at 11/13/2020  2138 Last data filed at 11/13/2020 1700 Gross per 24 hour  Intake 120 ml  Output 1100 ml  Net -980 ml   Filed Weights   11-06-2020 1322  Weight: 97.1 kg    Data Reviewed: I have personally reviewed and interpreted daily labs, tele strips, imagings as discussed above. I reviewed all nursing notes, pharmacy notes, vitals, pertinent old records I have discussed plan of care as described above with RN and patient/family.  CBC: Recent Labs  Lab 11/08/20 0424 11/09/20 0439 11/11/20 0518 11/12/20 0453 11/13/20 0502  WBC 20.5* 11.6* 7.8 11.8* 12.9*  HGB 17.9* 16.8 16.7 16.0 15.1  HCT 52.4* 50.2 49.0 47.6 43.8  MCV 90.8 92.3 92.6 93.3 92.0  PLT 155 131* 110* 112* 101*   Basic Metabolic Panel: Recent Labs  Lab 11/08/20 0424 11/09/20 0439 11/11/20 0518 11/12/20 0453 11/13/20 0502  NA 137 135 137 138 137  K 4.2 4.6 4.2 4.5 4.3  CL 101 99 102 103 105  CO2 28 25 27 27 26   GLUCOSE 77 152* 283* 202* 176*  BUN 40* 38* 38* 42* 45*  CREATININE 0.82 1.04 0.75 0.79 0.93  CALCIUM 8.4* 8.3* 8.4* 8.5* 8.1*    Studies: No results found.  Scheduled Meds: . albuterol  2 puff Inhalation TID  . atorvastatin  20 mg Oral Daily  . diltiazem  120 mg Oral Daily  . enoxaparin (LOVENOX) injection  40 mg Subcutaneous Q24H  . insulin aspart  0-15 Units Subcutaneous TID WC  . insulin aspart  0-5 Units Subcutaneous QHS  . insulin glargine  14 Units Subcutaneous BID  . methylPREDNISolone (SOLU-MEDROL) injection  60 mg Intravenous Q6H  . pantoprazole  40 mg Oral BID AC  . sodium chloride flush  10-40 mL Intracatheter Q12H   Continuous Infusions: PRN Meds: acetaminophen, guaiFENesin-dextromethorphan, ondansetron **OR** ondansetron (ZOFRAN) IV, sodium chloride, sodium chloride flush  Time spent: 35 minutes  Author: , MD [facility] 11/13/2020 9:38 PM  To reach On-call, see care teams to locate the attending and reach out via [email or web address]. Between 7PM-7AM, please contact  night-coverage If you still have difficulty reaching the attending provider, please page the [facility] (Director on Call) for [facility] on amion for assistance.

## 2020-11-13 NOTE — Progress Notes (Signed)
PT Cancellation Note  Patient Details Name: RAYLON LAMSON MRN: [id number] DOB: 1951/05/30   Cancelled Treatment:    Reason Eval/Treat Not Completed: Patient declined,  Reports up with Rn today.  Sharen Heck PT Acute Rehabilitation Services Pager [phone] Office [phone]  11/13/2020, 4:44 PM

## 2020-11-14 ENCOUNTER — Inpatient Hospital Stay (HOSPITAL_COMMUNITY): Payer: Medicare HMO

## 2020-11-14 DIAGNOSIS — U071 COVID-19: Secondary | ICD-10-CM | POA: Diagnosis not present

## 2020-11-14 DIAGNOSIS — J1282 Pneumonia due to coronavirus disease 2019: Secondary | ICD-10-CM | POA: Diagnosis not present

## 2020-11-14 LAB — COMPREHENSIVE METABOLIC PANEL
ALT: 57 U/L — ABNORMAL HIGH (ref 0–44)
AST: 21 U/L (ref 15–41)
Albumin: 2.2 g/dL — ABNORMAL LOW (ref 3.5–5.0)
Alkaline Phosphatase: 47 U/L (ref 38–126)
Anion gap: 9 (ref 5–15)
BUN: 44 mg/dL — ABNORMAL HIGH (ref 8–23)
CO2: 25 mmol/L (ref 22–32)
Calcium: 8.2 mg/dL — ABNORMAL LOW (ref 8.9–10.3)
Chloride: 103 mmol/L (ref 98–111)
Creatinine, Ser: 0.73 mg/dL (ref 0.61–1.24)
GFR, Estimated: >60 mL/min (ref >60)
Glucose, Bld: 147 mg/dL — ABNORMAL HIGH (ref 70–99)
Potassium: 4.1 mmol/L (ref 3.5–5.1)
Sodium: 137 mmol/L (ref 135–145)
Total Bilirubin: 0.8 mg/dL (ref 0.3–1.2)
Total Protein: 4.4 g/dL — ABNORMAL LOW (ref 6.5–8.1)

## 2020-11-14 LAB — CBC WITH DIFFERENTIAL/PLATELET
Abs Immature Granulocytes: 0.07 10*3/uL (ref 0.00–0.07)
Basophils Absolute: 0 10*3/uL (ref 0.0–0.1)
Basophils Relative: 0 %
Eosinophils Absolute: 0 10*3/uL (ref 0.0–0.5)
Eosinophils Relative: 0 %
HCT: 48.1 % (ref 39.0–52.0)
Hemoglobin: 16.5 g/dL (ref 13.0–17.0)
Immature Granulocytes: 1 %
Lymphocytes Relative: 1 %
Lymphs Abs: 0.2 10*3/uL — ABNORMAL LOW (ref 0.7–4.0)
MCH: 31.3 pg (ref 26.0–34.0)
MCHC: 34.3 g/dL (ref 30.0–36.0)
MCV: 91.3 fL (ref 80.0–100.0)
Monocytes Absolute: 0.2 10*3/uL (ref 0.1–1.0)
Monocytes Relative: 1 %
Neutro Abs: 13.6 10*3/uL — ABNORMAL HIGH (ref 1.7–7.7)
Neutrophils Relative %: 97 %
Platelets: 98 10*3/uL — ABNORMAL LOW (ref 150–400)
RBC: 5.27 MIL/uL (ref 4.22–5.81)
RDW: 13.7 % (ref 11.5–15.5)
WBC: 14.1 10*3/uL — ABNORMAL HIGH (ref 4.0–10.5)
nRBC: 0 % (ref 0.0–0.2)

## 2020-11-14 LAB — GLUCOSE, CAPILLARY
Glucose-Capillary: 120 mg/dL — ABNORMAL HIGH (ref 70–99)
Glucose-Capillary: 235 mg/dL — ABNORMAL HIGH (ref 70–99)
Glucose-Capillary: 243 mg/dL — ABNORMAL HIGH (ref 70–99)
Glucose-Capillary: 200 mg/dL — ABNORMAL HIGH (ref 70–99)

## 2020-11-14 LAB — CBC
HCT: 42.8 % (ref 39.0–52.0)
Hemoglobin: 14.7 g/dL (ref 13.0–17.0)
MCH: 31.1 pg (ref 26.0–34.0)
MCHC: 34.3 g/dL (ref 30.0–36.0)
MCV: 90.7 fL (ref 80.0–100.0)
Platelets: 92 10*3/uL — ABNORMAL LOW (ref 150–400)
RBC: 4.72 MIL/uL (ref 4.22–5.81)
RDW: 13.5 % (ref 11.5–15.5)
WBC: 13.2 10*3/uL — ABNORMAL HIGH (ref 4.0–10.5)
nRBC: 0 % (ref 0.0–0.2)

## 2020-11-14 LAB — VITAMIN B12: Vitamin B-12: 464 pg/mL (ref 180–914)

## 2020-11-14 LAB — LACTATE DEHYDROGENASE: LDH: 252 U/L — ABNORMAL HIGH (ref 98–192)

## 2020-11-14 LAB — PROTIME-INR
INR: 1.2 (ref 0.8–1.2)
Prothrombin Time: 14.9 seconds (ref 11.4–15.2)

## 2020-11-14 LAB — DIRECT ANTIGLOBULIN TEST (NOT AT ARMC)
DAT, IgG: NEGATIVE
DAT, complement: NEGATIVE

## 2020-11-14 LAB — FIBRINOGEN: Fibrinogen: 406 mg/dL (ref 210–475)

## 2020-11-14 LAB — TYPE AND SCREEN
ABO/RH(D): O POS
Antibody Screen: NEGATIVE

## 2020-11-14 LAB — TECHNOLOGIST SMEAR REVIEW

## 2020-11-14 LAB — FOLATE: Folate: 10.5 ng/mL (ref >5.9)

## 2020-11-14 LAB — C-REACTIVE PROTEIN: CRP: 0.6 mg/dL (ref <1.0)

## 2020-11-14 LAB — PLATELET BY CITRATE

## 2020-11-14 LAB — D-DIMER, QUANTITATIVE: D-Dimer, Quant: 0.75 ug/mL-FEU — ABNORMAL HIGH (ref 0.00–0.50)

## 2020-11-14 MED ORDER — SODIUM CHLORIDE 0.9% FLUSH: 3.0000 mL | INTRAVENOUS | Status: DC | PRN

## 2020-11-14 MED ORDER — SODIUM CHLORIDE 0.9% FLUSH
3.0000 mL | Freq: Two times a day (BID) | INTRAVENOUS | Status: DC
  Administered 2020-11-14 – 2020-11-15 (×3): 3 mL via INTRAVENOUS

## 2020-11-14 MED ORDER — METHYLPREDNISOLONE SODIUM SUCC 125 MG IJ SOLR
60.0000 mg | Freq: Four times a day (QID) | INTRAMUSCULAR | Status: DC
  Administered 2020-11-15 (×4): 60 mg via INTRAVENOUS
  Filled 2020-11-14 (×4): qty 2

## 2020-11-14 MED ORDER — METHYLPREDNISOLONE SODIUM SUCC 125 MG IJ SOLR
60.0000 mg | Freq: Two times a day (BID) | INTRAMUSCULAR | Status: DC
  Administered 2020-11-14: 60 mg via INTRAVENOUS
  Filled 2020-11-14: qty 2

## 2020-11-14 MED ORDER — SODIUM CHLORIDE 0.9 % IV SOLN: 250.0000 mL | INTRAVENOUS | Status: DC | PRN

## 2020-11-14 NOTE — Progress Notes (Signed)
[facility] Progress Note  Patient: Lawrence [name]    GEX:[id number]  DOA: 10/11/2020     Date of Service: the patient was seen and examined on 11/14/2020  Brief hospital course: Paroxysmal A. fib, aneurysm.  Presented with complaints of COVID-19 pneumonia and hypoxia. Currently plan is continue supportive care.  Assessment and Plan: 1.  Acute hypoxic respiratory failure, POA 88% on room air on admission Acute COVID-19 Viral Pneumonia CXR: hazy bilateral peripheral opacities Unable to tolerate CT scan due to severe hypoxia Oxygen requirement: On heated high flow.  On 1/3 patient was on 45% FiO2 30 L of oxygen.  On 1/4 patient required 100% FiO2 50 L of oxygen. CRP: Normal now we will recheck tomorrow. Remdesivir: Completed course Steroids: Solu-Medrol, tapered between 12/25 and 12/31.  Back on IV Solu-Medrol from 12/31.  Unable to tolerate taper on 1/4. Back on IV Solu-Medrol 60 every 6 hours.  Baricitinib/Actemra(off-label use): Patient refused The investigational nature of this medication was discussed with the patient/HCPOA and they choose to proceed as the potential benefits are felt to outweigh risks at this time.  Antibiotics: None Vitamin C and Zinc: Continue DVT Prophylaxis: Place and maintain sequential compression device Start: 11/14/20 0746 Place and maintain sequential compression device Start: 11/14/20 0744 SCDs Start: 10/17/2020 2204 Prone positioning and incentive spirometer use recommended.  Repeat Covid test on 11/15/2020, depending on the cycle threshold value will decide on isolation continuation or not.  The treatment plan and use of medications and known side effects were discussed with patient/family. It was clearly explained that Complete risks and long-term side effects are unknown, however in the best clinical judgment they seem to be of some clinical benefit rather than medical risks. Patient/family agree with the treatment plan and want to receive these  treatments as indicated.   2.  QT prolongation Monitor.  Currently improving.  3.  Capacity evaluation Psychiatry consulted. Patient does have capacity to make medical decisions.  4.  D-dimer elevation Patient was on therapeutic anticoagulation. Lower extremity Doppler negative. PCCM was consulted and recommended to discontinue anticoagulation if the lower extremity Doppler is is negative.  Back to DVT prophylaxis.  5.  Chronic A. fib On Cardizem. Not on anticoagulation prior to admission. Currently does not want to go on anticoagulation.  Monitor.  6.  HLD Hold statin  7.  Goals of care conversation. Discussed with patient as well as sister. Currently goal is to treat what is treatable.  8.  LFT elevation Currently improving.  Monitor.  9.  Acute thrombocytopenia. Etiology not clear. No evidence of hemolysis so far. Smear negative for schistocytes. Platelet by citrate also in 90s. INR normal. Fibrinogen normal. HIT currently pending. Discontinue Lovenox. May require alternative medication for DVT prophylaxis  Diet: Regular diet DVT Prophylaxis:   Place and maintain sequential compression device Start: 11/14/20 0746 Place and maintain sequential compression device Start: 11/14/20 0744 SCDs Start: 10/27/2020 2204    Advance goals of care discussion: Limited code  Family Communication: no family was present at bedside, at the time of interview.  Discussed with sister on the phone.  Disposition:  Status is: Inpatient  Remains inpatient appropriate because:Inpatient level of care appropriate due to severity of illness   Dispo:  Patient From: Home  Planned Disposition: Home with Health Care Svc  Expected discharge date: 11/20/2020  Medically stable for discharge: No  Subjective: No nausea no vomiting but no fever no chills.  Needing more oxygen on 1/4.  Physical Exam: General: Appear  in mild distress, no Rash; Oral Mucosa Clear, moist. no Abnormal Neck Mass  Or lumps, Conjunctiva normal  Cardiovascular: S1 and S2 Present, no Murmur, Respiratory: increased respiratory effort, Bilateral Air entry present and bilateral  Crackles, no wheezes Abdomen: Bowel Sound present, Soft and no tenderness Extremities: trace Pedal edema Neurology: alert and oriented to time, place, and person affect appropriate. no new focal deficit Gait not checked due to patient safety concerns  Vitals:   11/14/20 0904 11/14/20 0934 11/14/20 1210 11/14/20 1300  BP:      Pulse: 81   75  Resp: 18   (!) 24  Temp:   97.6 F (36.4 C)   TempSrc:   Oral   SpO2: 92% (!) 82%  93%  Weight:      Height:        Intake/Output Summary (Last 24 hours) at 11/14/2020 2042 Last data filed at 11/14/2020 1815 Gross per 24 hour  Intake 60 ml  Output 850 ml  Net -790 ml   Filed Weights   11/01/2020 1322  Weight: 97.1 kg    Data Reviewed: I have personally reviewed and interpreted daily labs, tele strips, imagings as discussed above. I reviewed all nursing notes, pharmacy notes, vitals, pertinent old records I have discussed plan of care as described above with RN and patient/family.  CBC: Recent Labs  Lab 11/11/20 0518 11/12/20 0453 11/13/20 0502 11/14/20 0418 11/14/20 0931  WBC 7.8 11.8* 12.9* 13.2* 14.1*  NEUTROABS  --   --   --   --  13.6*  HGB 16.7 16.0 15.1 14.7 16.5  HCT 49.0 47.6 43.8 42.8 48.1  MCV 92.6 93.3 92.0 90.7 91.3  PLT 110* 112* 101* 92* 98*   Basic Metabolic Panel: Recent Labs  Lab 11/09/20 0439 11/11/20 0518 11/12/20 0453 11/13/20 0502 11/14/20 0418  NA 135 137 138 137 137  K 4.6 4.2 4.5 4.3 4.1  CL 99 102 103 105 103  CO2 25 27 27 26 25   GLUCOSE 152* 283* 202* 176* 147*  BUN 38* 38* 42* 45* 44*  CREATININE 1.04 0.75 0.79 0.93 0.73  CALCIUM 8.3* 8.4* 8.5* 8.1* 8.2*    Studies: DG CHEST PORT 1 VIEW  Result Date: 11/14/2020 CLINICAL DATA:  Respiratory failure, COVID pneumonia EXAM: PORTABLE CHEST 1 VIEW COMPARISON:  11/11/2020 FINDINGS:  Prior median sternotomy. Stable cardiomediastinal contours. Bilateral airspace opacities most pronounced within the mid lung fields, not appreciably changed from prior. No pleural effusion or pneumothorax. IMPRESSION: No significant interval change in bilateral airspace opacities. Electronically Signed   By: Davina Poke D.O.   On: 11/14/2020 11:48    Scheduled Meds: . albuterol  2 puff Inhalation TID  . atorvastatin  20 mg Oral Daily  . diltiazem  120 mg Oral Daily  . insulin aspart  0-15 Units Subcutaneous TID WC  . insulin aspart  0-5 Units Subcutaneous QHS  . insulin glargine  14 Units Subcutaneous BID  . [START ON 11/15/2020] methylPREDNISolone (SOLU-MEDROL) injection  60 mg Intravenous Q6H  . pantoprazole  40 mg Oral BID AC  . sodium chloride flush  10-40 mL Intracatheter Q12H  . sodium chloride flush  3 mL Intravenous Q12H   Continuous Infusions: . sodium chloride     PRN Meds: sodium chloride, acetaminophen, guaiFENesin-dextromethorphan, ondansetron **OR** ondansetron (ZOFRAN) IV, sodium chloride, sodium chloride flush, sodium chloride flush  Time spent: 35 minutes  Author: Berle Mull, MD [facility] 11/14/2020 8:42 PM  To reach On-call, see care teams to locate  the attending and reach out via [email or web address]. Between 7PM-7AM, please contact night-coverage If you still have difficulty reaching the attending provider, please page the [facility] (Director on Call) for [facility] on amion for assistance.

## 2020-11-14 NOTE — Plan of Care (Signed)

## 2020-11-15 DIAGNOSIS — F411 Generalized anxiety disorder: Secondary | ICD-10-CM | POA: Diagnosis not present

## 2020-11-15 DIAGNOSIS — J9601 Acute respiratory failure with hypoxia: Secondary | ICD-10-CM | POA: Diagnosis not present

## 2020-11-15 DIAGNOSIS — U071 COVID-19: Secondary | ICD-10-CM | POA: Diagnosis not present

## 2020-11-15 DIAGNOSIS — I482 Chronic atrial fibrillation, unspecified: Secondary | ICD-10-CM | POA: Diagnosis not present

## 2020-11-15 LAB — COMPREHENSIVE METABOLIC PANEL
ALT: 70 U/L — ABNORMAL HIGH (ref 0–44)
AST: 23 U/L (ref 15–41)
Albumin: 2.2 g/dL — ABNORMAL LOW (ref 3.5–5.0)
Alkaline Phosphatase: 45 U/L (ref 38–126)
Anion gap: 6 (ref 5–15)
BUN: 39 mg/dL — ABNORMAL HIGH (ref 8–23)
CO2: 28 mmol/L (ref 22–32)
Calcium: 8 mg/dL — ABNORMAL LOW (ref 8.9–10.3)
Chloride: 103 mmol/L (ref 98–111)
Creatinine, Ser: 0.71 mg/dL (ref 0.61–1.24)
GFR, Estimated: >60 mL/min (ref >60)
Glucose, Bld: 145 mg/dL — ABNORMAL HIGH (ref 70–99)
Potassium: 4.2 mmol/L (ref 3.5–5.1)
Sodium: 137 mmol/L (ref 135–145)
Total Bilirubin: 1.2 mg/dL (ref 0.3–1.2)
Total Protein: 4.4 g/dL — ABNORMAL LOW (ref 6.5–8.1)

## 2020-11-15 LAB — CBC WITH DIFFERENTIAL/PLATELET
Abs Immature Granulocytes: 0.05 10*3/uL (ref 0.00–0.07)
Basophils Absolute: 0 10*3/uL (ref 0.0–0.1)
Basophils Relative: 0 %
Eosinophils Absolute: 0 10*3/uL (ref 0.0–0.5)
Eosinophils Relative: 0 %
HCT: 43.2 % (ref 39.0–52.0)
Hemoglobin: 14.7 g/dL (ref 13.0–17.0)
Immature Granulocytes: 1 %
Lymphocytes Relative: 2 %
Lymphs Abs: 0.2 10*3/uL — ABNORMAL LOW (ref 0.7–4.0)
MCH: 31.7 pg (ref 26.0–34.0)
MCHC: 34 g/dL (ref 30.0–36.0)
MCV: 93.1 fL (ref 80.0–100.0)
Monocytes Absolute: 0.1 10*3/uL (ref 0.1–1.0)
Monocytes Relative: 1 %
Neutro Abs: 10 10*3/uL — ABNORMAL HIGH (ref 1.7–7.7)
Neutrophils Relative %: 96 %
Platelets: 82 10*3/uL — ABNORMAL LOW (ref 150–400)
RBC: 4.64 MIL/uL (ref 4.22–5.81)
RDW: 13.7 % (ref 11.5–15.5)
WBC: 10.3 10*3/uL (ref 4.0–10.5)
nRBC: 0 % (ref 0.0–0.2)

## 2020-11-15 LAB — HEPARIN INDUCED PLATELET AB (HIT ANTIBODY): Heparin Induced Plt Ab: 0.09 OD (ref 0.000–0.400)

## 2020-11-15 LAB — GLUCOSE, CAPILLARY
Glucose-Capillary: 141 mg/dL — ABNORMAL HIGH (ref 70–99)
Glucose-Capillary: 295 mg/dL — ABNORMAL HIGH (ref 70–99)
Glucose-Capillary: 306 mg/dL — ABNORMAL HIGH (ref 70–99)
Glucose-Capillary: 297 mg/dL — ABNORMAL HIGH (ref 70–99)

## 2020-11-15 LAB — D-DIMER, QUANTITATIVE: D-Dimer, Quant: 1.02 ug/mL-FEU — ABNORMAL HIGH (ref 0.00–0.50)

## 2020-11-15 LAB — MAGNESIUM: Magnesium: 2.2 mg/dL (ref 1.7–2.4)

## 2020-11-15 LAB — C-REACTIVE PROTEIN: CRP: 0.6 mg/dL (ref <1.0)

## 2020-11-15 MED ORDER — METHYLPREDNISOLONE SODIUM SUCC 125 MG IJ SOLR
40.0000 mg | Freq: Every day | INTRAMUSCULAR | Status: DC
  Administered 2020-11-16 – 2020-11-18 (×3): 40 mg via INTRAVENOUS
  Filled 2020-11-15 (×3): qty 2

## 2020-11-15 NOTE — Plan of Care (Signed)

## 2020-11-15 NOTE — Progress Notes (Signed)
PROGRESS NOTE  Lawrence [name]  IDP:[id number] DOB: 12/02/1950 DOA: 10/28/2020 PCP: Benita Stabile, MD   Brief Narrative: Lawrence [name] is a 70 y.o. male with a history of PAF and aneurysm who presented 12/15 with covid pneumonia which has been treated with remdesivir, solumedrol, and refused immunomodulator treatment. He remains severely hypoxemic.  Assessment & Plan: Principal Problem:   Pneumonia due to COVID-19 virus Active Problems:   Atrial fibrillation, chronic (HCC)   Acute respiratory failure with hypoxia (HCC)   Hyperglycemia   Syncope and collapse   Anxiety state  Acute hypoxemic respiratory failure due to covid-19 pneumonia: - Remains severely hypoxemic. CXR 1/4 remains with bilateral infiltrates very stable from prior on my personal review today. Though duration of illness now surpasses 21 days, the severity of his respiratory failure remains such that isolation will be continued. - Completed remdesivir. Will taper steroids with durable resolution of inflammatory marker elevation. Remains with severe hypoxia. Declined immunomodulator Tx.  - Encourage OOB, IS, FV, and awake proning if able - Continue airborne, contact precautions for 21 days from positive testing. - Monitor CMP and inflammatory markers - Enoxaparin prophylactic dose.   Thrombocytopenia: No schistocytes on smear. HIT Ab negative.  - Will continue SCD ppx, trending downward.  Elevated d-dimer: Negative LE U/S for DVT.  - DVT ppx as able  Chronic atrial fibrillation:  - Continue diltiazem - Declines chronic anticoagulation  HLD:  - Holding statin  LFT elevation:  - Hold statin  Prolonged QT interval:  - Monitoring on telemetry.   DVT prophylaxis: SCDs Code Status: DNR Family Communication: None at bedside Disposition Plan:  Status is: Inpatient - Continued guarded prognosis  Remains inpatient appropriate because:Inpatient level of care appropriate due to severity of  illness  Dispo:  Patient From: Home  Planned Disposition: Home with Health Care Svc  Expected discharge date: 11/24/2020  Medically stable for discharge: No  Consultants:   PCCM  Psychiatry  Procedures:   None  Antimicrobials:  Remdesivir   Subjective: No major changes, doesn't feel any different. He's very hypoxic at rest with moderate dyspnea and severe dyspnea with any exertion at all. No chest pain or bleeding.  Objective: Vitals:   11/15/20 0600 11/15/20 0845 11/15/20 1215 11/15/20 1451  BP: 129/81  117/68   Pulse: (!) 55 69 86 68  Resp: 18 19 20 18   Temp: 98 F (36.7 C)  97.6 F (36.4 C)   TempSrc: Oral  Oral   SpO2: 96% 96% 94% 93%  Weight:      Height:        Intake/Output Summary (Last 24 hours) at 11/15/2020 1901 Last data filed at 11/15/2020 1724 Gross per 24 hour  Intake 480 ml  Output 1375 ml  Net -895 ml   Filed Weights   10/22/2020 1322  Weight: 97.1 kg    Gen: 70 y.o. male in no distress Pulm: Non-labored breathing HHFNC, diminished severely with crackles.  CV: Rate controlled. No murmur, rub, or gallop. No JVD, No pitting pedal edema. GI: Abdomen soft, non-tender, non-distended, with normoactive bowel sounds. No organomegaly or masses felt. Ext: Warm, no deformities Skin: No rashes, lesions or ulcers Neuro: Alert and oriented. No focal neurological deficits. Psych: Judgement and insight appear normal. Mood & affect appropriate.   Data Reviewed: I have personally reviewed following labs and imaging studies  CBC: Recent Labs  Lab 11/12/20 0453 11/13/20 0502 11/14/20 0418 11/14/20 0931 11/15/20 0709  WBC 11.8* 12.9* 13.2* 14.1* 10.3  NEUTROABS  --   --   --  13.6* 10.0*  HGB 16.0 15.1 14.7 16.5 14.7  HCT 47.6 43.8 42.8 48.1 43.2  MCV 93.3 92.0 90.7 91.3 93.1  PLT 112* 101* 92* 98* 82*   Basic Metabolic Panel: Recent Labs  Lab 11/11/20 0518 11/12/20 0453 11/13/20 0502 11/14/20 0418 11/15/20 0709  NA 137 138 137 137 137  K  4.2 4.5 4.3 4.1 4.2  CL 102 103 105 103 103  CO2 27 27 26 25 28   GLUCOSE 283* 202* 176* 147* 145*  BUN 38* 42* 45* 44* 39*  CREATININE 0.75 0.79 0.93 0.73 0.71  CALCIUM 8.4* 8.5* 8.1* 8.2* 8.0*  MG  --   --   --   --  2.2   GFR: Estimated Creatinine Clearance: 105 mL/min (by C-G formula based on SCr of 0.71 mg/dL). Liver Function Tests: Recent Labs  Lab 11/11/20 0518 11/12/20 0453 11/13/20 0502 11/14/20 0418 11/15/20 0709  AST 19 19 19 21 23   ALT 55* 52* 53* 57* 70*  ALKPHOS 64 57 50 47 45  BILITOT 0.8 0.9 0.9 0.8 1.2  PROT 5.1* 5.0* 4.6* 4.4* 4.4*  ALBUMIN 2.4* 2.6* 2.2* 2.2* 2.2*   No results for input(s): LIPASE, AMYLASE in the last 168 hours. No results for input(s): AMMONIA in the last 168 hours. Coagulation Profile: Recent Labs  Lab 11/14/20 0418  INR 1.2   Cardiac Enzymes: No results for input(s): CKTOTAL, CKMB, CKMBINDEX, TROPONINI in the last 168 hours. BNP (last 3 results) No results for input(s): PROBNP in the last 8760 hours. HbA1C: No results for input(s): HGBA1C in the last 72 hours. CBG: Recent Labs  Lab 11/14/20 1736 11/14/20 2046 11/15/20 0752 11/15/20 1212 11/15/20 1716  GLUCAP 243* 200* 141* 295* 306*   Lipid Profile: No results for input(s): CHOL, HDL, LDLCALC, TRIG, CHOLHDL, LDLDIRECT in the last 72 hours. Thyroid Function Tests: No results for input(s): TSH, T4TOTAL, FREET4, T3FREE, THYROIDAB in the last 72 hours. Anemia Panel: Recent Labs    11/14/20 0418  VITAMINB12 464  FOLATE 10.5   Urine analysis:    Component Value Date/Time   COLORURINE YELLOW 10/26/2020 1900   APPEARANCEUR HAZY (A) 11/04/2020 1900   LABSPEC 1.032 (H) 10/16/2020 1900   PHURINE 5.0 10/19/2020 1900   GLUCOSEU 50 (A) 10/13/2020 1900   HGBUR NEGATIVE 10/15/2020 1900   BILIRUBINUR NEGATIVE 10/27/2020 1900   KETONESUR 20 (A) 10/22/2020 1900   PROTEINUR 100 (A) 10/26/2020 1900   UROBILINOGEN 0.2 06/17/2013 1012   NITRITE NEGATIVE 10/11/2020 1900    LEUKOCYTESUR NEGATIVE 10/18/2020 1900   No results found for this or any previous visit (from the past 240 hour(s)).    Radiology Studies: DG CHEST PORT 1 VIEW  Result Date: 11/14/2020 CLINICAL DATA:  Respiratory failure, COVID pneumonia EXAM: PORTABLE CHEST 1 VIEW COMPARISON:  11/11/2020 FINDINGS: Prior median sternotomy. Stable cardiomediastinal contours. Bilateral airspace opacities most pronounced within the mid lung fields, not appreciably changed from prior. No pleural effusion or pneumothorax. IMPRESSION: No significant interval change in bilateral airspace opacities. Electronically Signed   By: Davina Poke D.O.   On: 11/14/2020 11:48    Scheduled Meds: . albuterol  2 puff Inhalation TID  . atorvastatin  20 mg Oral Daily  . diltiazem  120 mg Oral Daily  . insulin aspart  0-15 Units Subcutaneous TID WC  . insulin aspart  0-5 Units Subcutaneous QHS  . insulin glargine  14 Units Subcutaneous BID  . methylPREDNISolone (SOLU-MEDROL) injection  60 mg Intravenous Q6H  . pantoprazole  40 mg Oral BID AC  . sodium chloride flush  10-40 mL Intracatheter Q12H  . sodium chloride flush  3 mL Intravenous Q12H   Continuous Infusions: . sodium chloride       LOS: 21 days   Time spent: 35 minutes.  [name], MD [facility] [email or web address] 11/15/2020, 7:01 PM

## 2020-11-16 DIAGNOSIS — I482 Chronic atrial fibrillation, unspecified: Secondary | ICD-10-CM | POA: Diagnosis not present

## 2020-11-16 DIAGNOSIS — J9601 Acute respiratory failure with hypoxia: Secondary | ICD-10-CM | POA: Diagnosis not present

## 2020-11-16 DIAGNOSIS — U071 COVID-19: Secondary | ICD-10-CM | POA: Diagnosis not present

## 2020-11-16 DIAGNOSIS — F411 Generalized anxiety disorder: Secondary | ICD-10-CM | POA: Diagnosis not present

## 2020-11-16 LAB — BASIC METABOLIC PANEL
Anion gap: 8 (ref 5–15)
BUN: 39 mg/dL — ABNORMAL HIGH (ref 8–23)
CO2: 26 mmol/L (ref 22–32)
Calcium: 8 mg/dL — ABNORMAL LOW (ref 8.9–10.3)
Chloride: 103 mmol/L (ref 98–111)
Creatinine, Ser: 0.75 mg/dL (ref 0.61–1.24)
GFR, Estimated: >60 mL/min (ref >60)
Glucose, Bld: 220 mg/dL — ABNORMAL HIGH (ref 70–99)
Potassium: 4.1 mmol/L (ref 3.5–5.1)
Sodium: 137 mmol/L (ref 135–145)

## 2020-11-16 LAB — CBC
HCT: 40.6 % (ref 39.0–52.0)
Hemoglobin: 13.9 g/dL (ref 13.0–17.0)
MCH: 31.5 pg (ref 26.0–34.0)
MCHC: 34.2 g/dL (ref 30.0–36.0)
MCV: 92.1 fL (ref 80.0–100.0)
Platelets: 79 10*3/uL — ABNORMAL LOW (ref 150–400)
RBC: 4.41 MIL/uL (ref 4.22–5.81)
RDW: 13.8 % (ref 11.5–15.5)
WBC: 10.9 10*3/uL — ABNORMAL HIGH (ref 4.0–10.5)
nRBC: 0 % (ref 0.0–0.2)

## 2020-11-16 LAB — GLUCOSE, CAPILLARY
Glucose-Capillary: 171 mg/dL — ABNORMAL HIGH (ref 70–99)
Glucose-Capillary: 266 mg/dL — ABNORMAL HIGH (ref 70–99)
Glucose-Capillary: 171 mg/dL — ABNORMAL HIGH (ref 70–99)
Glucose-Capillary: 133 mg/dL — ABNORMAL HIGH (ref 70–99)

## 2020-11-16 MED ORDER — INSULIN ASPART 100 UNIT/ML ~~LOC~~ SOLN
0.0000 [IU] | Freq: Three times a day (TID) | SUBCUTANEOUS | Status: DC
  Administered 2020-11-16: 11 [IU] via SUBCUTANEOUS
  Administered 2020-11-16 – 2020-11-17 (×2): 4 [IU] via SUBCUTANEOUS
  Administered 2020-11-17: 11 [IU] via SUBCUTANEOUS
  Administered 2020-11-18: 7 [IU] via SUBCUTANEOUS
  Administered 2020-11-18: 3 [IU] via SUBCUTANEOUS

## 2020-11-16 MED ORDER — INSULIN ASPART 100 UNIT/ML ~~LOC~~ SOLN
3.0000 [IU] | Freq: Three times a day (TID) | SUBCUTANEOUS | Status: DC
  Administered 2020-11-16 – 2020-11-18 (×5): 3 [IU] via SUBCUTANEOUS

## 2020-11-16 MED ORDER — INSULIN ASPART 100 UNIT/ML ~~LOC~~ SOLN
0.0000 [IU] | Freq: Every day | SUBCUTANEOUS | Status: DC
  Administered 2020-11-17: 2 [IU] via SUBCUTANEOUS

## 2020-11-16 NOTE — Progress Notes (Signed)
PROGRESS NOTE  Lawrence [name]  [id number]:[id number] DOB: [birth year] DOA: 10/29/2020 PCP: Benita Stabile, MD   Brief Narrative: Lawrence [name] is a 70 y.o. male with a history of PAF and aneurysm who presented 12/15 with covid pneumonia which has been treated with remdesivir, solumedrol, and refused immunomodulator treatment. He remains severely hypoxemic.  Assessment & Plan: Principal Problem:   Pneumonia due to COVID-19 virus Active Problems:   Atrial fibrillation, chronic (HCC)   Acute respiratory failure with hypoxia (HCC)   Hyperglycemia   Syncope and collapse   Anxiety state  Acute hypoxemic respiratory failure due to covid-19 pneumonia: - Remains severely hypoxemic. CXR 1/4 remains with bilateral infiltrates very stable from prior on my personal review today. Though duration of illness now surpasses 21 days, the severity of his respiratory failure remains such that isolation will be continued. - Completed remdesivir. Will taper steroids further today with durable resolution of inflammatory marker elevation and some hyperglycemia. Remains with severe hypoxia. Declined immunomodulator Tx.  - Encourage OOB, IS, FV, and awake proning if able  Steroid induced hyperglycemia:  - Augment insulin to maintain better control. Tapering steroids as above.   Thrombocytopenia: No schistocytes on smear. HIT Ab negative.  - Will continue SCD ppx, trending about stable. No bleeding.  Elevated d-dimer: Negative LE U/S for DVT.  - DVT ppx with SCDs, unable to anticoagulate with thrombocytopenia.  Chronic atrial fibrillation:  - Continue diltiazem - Declines chronic anticoagulation  HLD:  - Holding statin  LFT elevation: Mild - Hold statin, monitor intermittently  Prolonged QT interval:  - Monitoring on telemetry.   DVT prophylaxis: SCDs Code Status: DNR Family Communication: None at bedside Disposition Plan:  Status is: Inpatient - Continued guarded prognosis  Remains inpatient  appropriate because:Inpatient level of care appropriate due to severity of illness  Dispo:  Patient From: Home  Planned Disposition: Home with Health Care Svc  Expected discharge date: 11/24/2020  Medically stable for discharge: No  Consultants:   PCCM  Psychiatry  Procedures:   None  Antimicrobials:  Remdesivir   Subjective: Refused medications and lab draw this morning because pancakes were cold and milk was not the kind he wanted. He reports his breathing is about the same today, very short of breath when trying to move at all. No chest pain or other issues.   Objective: Vitals:   11/16/20 0536 11/16/20 0843 11/16/20 1337 11/16/20 1411  BP: 124/69  117/85   Pulse: 60 66  78  Resp: 15 (!) 24  19  Temp: 98 F (36.7 C)  98.6 F (37 C)   TempSrc: Oral  Oral   SpO2: 92% 90%  91%  Weight:      Height:        Intake/Output Summary (Last 24 hours) at 11/16/2020 1729 Last data filed at 11/16/2020 1300 Gross per 24 hour  Intake 480 ml  Output 700 ml  Net -220 ml   Filed Weights   10/26/2020 1322  Weight: 97.1 kg   Gen: 70 y.o. male in no distress Pulm: Mildly labored at complete rest, with HHF, SpO2 into low 80%'s, improved somewhat with NRB and cessation of conversation. Crackles bilaterally. CV: Regular rate and rhythm. No murmur, rub, or gallop. No JVD, no dependent edema. GI: Abdomen soft, non-tender, non-distended, with normoactive bowel sounds.  Ext: Warm, no deformities Skin: No rashes, lesions or ulcers on visualized skin. Neuro: Alert and oriented. No focal neurological deficits. Psych: Judgement and insight appear fair. Mood euthymic &  affect congruent. Behavior is appropriate.    Data Reviewed: I have personally reviewed following labs and imaging studies  CBC: Recent Labs  Lab 11/13/20 0502 11/14/20 0418 11/14/20 0931 11/15/20 0709 11/16/20 1341  WBC 12.9* 13.2* 14.1* 10.3 10.9*  NEUTROABS  --   --  13.6* 10.0*  --   HGB 15.1 14.7 16.5 14.7 13.9   HCT 43.8 42.8 48.1 43.2 40.6  MCV 92.0 90.7 91.3 93.1 92.1  PLT 101* 92* 98* 82* 79*   Basic Metabolic Panel: Recent Labs  Lab 11/12/20 0453 11/13/20 0502 11/14/20 0418 11/15/20 0709 11/16/20 1341  NA 138 137 137 137 137  K 4.5 4.3 4.1 4.2 4.1  CL 103 105 103 103 103  CO2 27 26 25 28 26   GLUCOSE 202* 176* 147* 145* 220*  BUN 42* 45* 44* 39* 39*  CREATININE 0.79 0.93 0.73 0.71 0.75  CALCIUM 8.5* 8.1* 8.2* 8.0* 8.0*  MG  --   --   --  2.2  --    GFR: Estimated Creatinine Clearance: 105 mL/min (by C-G formula based on SCr of 0.75 mg/dL).   Liver Function Tests: Recent Labs  Lab 11/11/20 0518 11/12/20 0453 11/13/20 0502 11/14/20 0418 11/15/20 0709  AST 19 19 19 21 23   ALT 55* 52* 53* 57* 70*  ALKPHOS 64 57 50 47 45  BILITOT 0.8 0.9 0.9 0.8 1.2  PROT 5.1* 5.0* 4.6* 4.4* 4.4*  ALBUMIN 2.4* 2.6* 2.2* 2.2* 2.2*   Coagulation Profile: Recent Labs  Lab 11/14/20 0418  INR 1.2   CBG: Recent Labs  Lab 11/15/20 1716 11/15/20 2120 11/16/20 0740 11/16/20 1212 11/16/20 1655  GLUCAP 306* 297* 171* 266* 171*   Anemia Panel: Recent Labs    11/14/20 0418  VITAMINB12 464  FOLATE 10.5   Urine analysis:    Component Value Date/Time   COLORURINE YELLOW 10/22/2020 1900   APPEARANCEUR HAZY (A) 11/03/2020 1900   LABSPEC 1.032 (H) 10/23/2020 1900   PHURINE 5.0 11/03/2020 1900   GLUCOSEU 50 (A) 11/10/2020 1900   HGBUR NEGATIVE 10/19/2020 1900   BILIRUBINUR NEGATIVE 10/23/2020 1900   KETONESUR 20 (A) 10/19/2020 1900   PROTEINUR 100 (A) 10/31/2020 1900   UROBILINOGEN 0.2 06/17/2013 1012   NITRITE NEGATIVE 11/08/2020 1900   LEUKOCYTESUR NEGATIVE 10/23/2020 1900    Scheduled Meds: . albuterol  2 puff Inhalation TID  . atorvastatin  20 mg Oral Daily  . diltiazem  120 mg Oral Daily  . insulin aspart  0-20 Units Subcutaneous TID WC  . insulin aspart  0-5 Units Subcutaneous QHS  . insulin aspart  3 Units Subcutaneous TID WC  . insulin glargine  14 Units Subcutaneous  BID  . methylPREDNISolone (SOLU-MEDROL) injection  40 mg Intravenous Daily  . pantoprazole  40 mg Oral BID AC  . sodium chloride flush  10-40 mL Intracatheter Q12H  . sodium chloride flush  3 mL Intravenous Q12H   Continuous Infusions: . sodium chloride       LOS: 22 days   Time spent: 35 minutes.  [name], MD [facility] [email or web address] 11/16/2020, 5:29 PM

## 2020-11-16 NOTE — Plan of Care (Signed)

## 2020-11-16 NOTE — TOC Progression Note (Signed)
Transition of Care [facility]) - Progression Note    Patient Details  Name: Lawrence [name] MRN: [id number] Date of Birth: 30-Jun-1951  Transition of Care [facility] CM/SW Contact  Geni Bers, RN Phone Number: 11/16/2020, 3:55 PM  Clinical Narrative:     Pt is declining SNF and HHPT. TOC will continue to follow.   Expected Discharge Plan: Home/Self Care Barriers to Discharge: No Barriers Identified  Expected Discharge Plan and Services Expected Discharge Plan: Home/Self Care       Living arrangements for the past 2 months: Single Family Home                                       Social Determinants of Health (SDOH) Interventions    Readmission Risk Interventions Readmission Risk Prevention Plan 07/27/2018  Post Dischage Appt Complete  Medication Screening Complete  Transportation Screening Complete  PCP follow-up Complete  Some recent data might be hidden

## 2020-11-16 NOTE — Progress Notes (Signed)
PT Cancellation Note  Patient Details Name: Lawrence [name] MRN: [id number] DOB: [birth year]   Cancelled Treatment:    Reason Eval/Treat Not Completed: Other (comment) (pt declined PT. He stated he has "other issues going on" and is "on an important phone call right now". Will follow.)  Tamala Ser PT 11/16/2020  Acute Rehabilitation Services Pager [phone] Office [phone]

## 2020-11-16 NOTE — Progress Notes (Signed)
Patient refusing labs and morning medications because his "pancakes were cold and he received 2% milk instead of whole". Currently waiting on mild but refusing meds and labs until he eats breakfast. Informed patient that very important that he take his medications and allow [place] to do labs. Patient states he is not and wants to speak with Dr and charge nurse.

## 2020-11-17 DIAGNOSIS — U071 COVID-19: Secondary | ICD-10-CM | POA: Diagnosis not present

## 2020-11-17 DIAGNOSIS — I482 Chronic atrial fibrillation, unspecified: Secondary | ICD-10-CM | POA: Diagnosis not present

## 2020-11-17 DIAGNOSIS — J9601 Acute respiratory failure with hypoxia: Secondary | ICD-10-CM | POA: Diagnosis not present

## 2020-11-17 DIAGNOSIS — F411 Generalized anxiety disorder: Secondary | ICD-10-CM | POA: Diagnosis not present

## 2020-11-17 LAB — GLUCOSE, CAPILLARY
Glucose-Capillary: 76 mg/dL (ref 70–99)
Glucose-Capillary: 197 mg/dL — ABNORMAL HIGH (ref 70–99)
Glucose-Capillary: 291 mg/dL — ABNORMAL HIGH (ref 70–99)
Glucose-Capillary: 228 mg/dL — ABNORMAL HIGH (ref 70–99)

## 2020-11-17 MED ORDER — INSULIN GLARGINE 100 UNIT/ML ~~LOC~~ SOLN
8.0000 [IU] | Freq: Two times a day (BID) | SUBCUTANEOUS | Status: DC
  Administered 2020-11-18: 8 [IU] via SUBCUTANEOUS
  Filled 2020-11-17: qty 0.08

## 2020-11-17 MED ORDER — OXYMETAZOLINE HCL 0.05 % NA SOLN
1.0000 | Freq: Two times a day (BID) | NASAL | Status: AC
  Administered 2020-11-17 – 2020-11-18 (×3): 1 via NASAL
  Filled 2020-11-17: qty 15

## 2020-11-17 NOTE — Progress Notes (Signed)
PROGRESS NOTE  Lawrence [name]  AUQ:[id number] DOB: 07/12/1951 DOA: 11/02/2020 PCP: [name], MD   Brief Narrative: Lawrence [name] is a 70 y.o. male with a history of PAF and aneurysm who presented 12/15 with covid pneumonia which has been treated with remdesivir, solumedrol, and refused immunomodulator treatment. He remains severely hypoxemic.  Assessment & Plan: Principal Problem:   Pneumonia due to COVID-19 virus Active Problems:   Atrial fibrillation, chronic (HCC)   Acute respiratory failure with hypoxia (HCC)   Hyperglycemia   Syncope and collapse   Anxiety state  Acute hypoxemic respiratory failure due to covid-19 pneumonia: - Remains severely hypoxemic. CXR 1/4 stable with bilateral infiltrates. Though duration of illness now surpasses 21 days, the severity of his respiratory failure remains such that isolation will be continued. - Completed remdesivir.  - Declined immunomodulator, was confirmed to have capacity to make medical decisions by medical team and psychiatry.  - Continue to taper steroids further today with durable resolution of inflammatory marker elevation and some hyperglycemia. - Encourage OOB, IS, FV, and awake proning if able  Steroid induced hyperglycemia: Improving with tapering steroid doses. - Decrease basal insulin with fasting CBG in 70's. Postprandials still elevated, so will not change doses.   Thrombocytopenia: No schistocytes on smear. HIT Ab negative.  - Will continue SCD ppx, trending about stable. No bleeding.  Elevated d-dimer: Negative LE U/S for DVT.  - DVT ppx with SCDs, unable to anticoagulate with thrombocytopenia.  Chronic atrial fibrillation:  - Continue diltiazem - Declines chronic anticoagulation  HLD:  - Holding statin  LFT elevation: Mild - Hold statin, monitor intermittently  Prolonged QT interval:  - Monitoring on telemetry.   Epistaxis: Mild.  - Nasal saline, add afrin  DVT prophylaxis: SCDs Code Status:  DNR Family Communication: None at bedside. Did not request a call. Disposition Plan:  Status is: Inpatient - Continued guarded prognosis  Remains inpatient appropriate because:Inpatient level of care appropriate due to severity of illness  Dispo:  Patient From: Home  Planned Disposition: Home with Health Care Svc  Expected discharge date: 11/24/2020  Medically stable for discharge: No  Consultants:   PCCM  Psychiatry  Procedures:   None  Antimicrobials:  Remdesivir   Subjective: Shortness of breath stable, no chest pain. Having some blood down from the nose, coughs it up intermittently. No other bleeding.   Objective: Vitals:   11/16/20 2309 11/17/20 0400 11/17/20 0522 11/17/20 0959  BP:   118/76   Pulse: 65 (!) 58    Resp: (!) 21 20    Temp:   98.2 F (36.8 C)   TempSrc:   Oral   SpO2: 92% 95%  (!) 87%  Weight:      Height:        Intake/Output Summary (Last 24 hours) at 11/17/2020 1442 Last data filed at 11/17/2020 1152 Gross per 24 hour  Intake --  Output 1450 ml  Net -1450 ml   Filed Weights   10/21/2020 1322  Weight: 97.1 kg   Gen: 70 y.o. male in no distress Pulm: Nonlabored at rest, able to rise for pulm exam maybe stronger than yesterday. Crackles, diminished. CV: Regular rate and rhythm. No murmur, rub, or gallop. No JVD, no dependent edema. GI: Abdomen soft, non-tender, non-distended, with normoactive bowel sounds.  Ext: Warm, no deformities Skin: No rashes, lesions or ulcers on visualized skin. Neuro: Alert and oriented. No focal neurological deficits. Psych: Judgement and insight appear fair. Mood euthymic & affect congruent.  Behavior is appropriate.    Data Reviewed: I have personally reviewed following labs and imaging studies  CBC: Recent Labs  Lab 11/13/20 0502 11/14/20 0418 11/14/20 0931 11/15/20 0709 11/16/20 1341  WBC 12.9* 13.2* 14.1* 10.3 10.9*  NEUTROABS  --   --  13.6* 10.0*  --   HGB 15.1 14.7 16.5 14.7 13.9  HCT 43.8 42.8  48.1 43.2 40.6  MCV 92.0 90.7 91.3 93.1 92.1  PLT 101* 92* 98* 82* 79*   Basic Metabolic Panel: Recent Labs  Lab 11/12/20 0453 11/13/20 0502 11/14/20 0418 11/15/20 0709 11/16/20 1341  NA 138 137 137 137 137  K 4.5 4.3 4.1 4.2 4.1  CL 103 105 103 103 103  CO2 27 26 25 28 26   GLUCOSE 202* 176* 147* 145* 220*  BUN 42* 45* 44* 39* 39*  CREATININE 0.79 0.93 0.73 0.71 0.75  CALCIUM 8.5* 8.1* 8.2* 8.0* 8.0*  MG  --   --   --  2.2  --    GFR: Estimated Creatinine Clearance: 105 mL/min (by C-G formula based on SCr of 0.75 mg/dL).   Liver Function Tests: Recent Labs  Lab 11/11/20 0518 11/12/20 0453 11/13/20 0502 11/14/20 0418 11/15/20 0709  AST 19 19 19 21 23   ALT 55* 52* 53* 57* 70*  ALKPHOS 64 57 50 47 45  BILITOT 0.8 0.9 0.9 0.8 1.2  PROT 5.1* 5.0* 4.6* 4.4* 4.4*  ALBUMIN 2.4* 2.6* 2.2* 2.2* 2.2*   Coagulation Profile: Recent Labs  Lab 11/14/20 0418  INR 1.2   CBG: Recent Labs  Lab 11/16/20 1212 11/16/20 1655 11/16/20 2038 11/17/20 0818 11/17/20 1149  GLUCAP 266* 171* 133* 76 197*   Anemia Panel: No results for input(s): VITAMINB12, FOLATE, FERRITIN, TIBC, IRON, RETICCTPCT in the last 72 hours. Urine analysis:    Component Value Date/Time   COLORURINE YELLOW 10/31/2020 1900   APPEARANCEUR HAZY (A) 10/27/2020 1900   LABSPEC 1.032 (H) 10/20/2020 1900   PHURINE 5.0 10/29/2020 1900   GLUCOSEU 50 (A) 10/24/2020 1900   HGBUR NEGATIVE 10/16/2020 1900   BILIRUBINUR NEGATIVE 10/22/2020 1900   KETONESUR 20 (A) 10/14/2020 1900   PROTEINUR 100 (A) 10/19/2020 1900   UROBILINOGEN 0.2 06/17/2013 1012   NITRITE NEGATIVE 10/31/2020 1900   LEUKOCYTESUR NEGATIVE 11/07/2020 1900    Scheduled Meds: . albuterol  2 puff Inhalation TID  . atorvastatin  20 mg Oral Daily  . diltiazem  120 mg Oral Daily  . insulin aspart  0-20 Units Subcutaneous TID WC  . insulin aspart  0-5 Units Subcutaneous QHS  . insulin aspart  3 Units Subcutaneous TID WC  . [START ON 11/18/2020]  insulin glargine  8 Units Subcutaneous BID  . methylPREDNISolone (SOLU-MEDROL) injection  40 mg Intravenous Daily  . pantoprazole  40 mg Oral BID AC  . sodium chloride flush  10-40 mL Intracatheter Q12H  . sodium chloride flush  3 mL Intravenous Q12H   Continuous Infusions: . sodium chloride       LOS: 23 days   Time spent: 35 minutes.  [name], MD [facility] [email or web address] 11/17/2020, 2:42 PM

## 2020-11-17 NOTE — Plan of Care (Signed)

## 2020-11-17 NOTE — Progress Notes (Signed)
PT Cancellation Note  Patient Details Name: Lawrence [name] MRN: [id number] DOB: Aug 27, 1951   Cancelled Treatment:    Reason Eval/Treat Not Completed: Medical issues which prohibited therapy. Rn paging respiratory as pt having difficulty maintaining adequate O2 saturations. Defer PT at this time. Continue efforts however pt has refused 2 out of the last 2 attempts.   [facility] 11/17/2020, 3:18 PM

## 2020-11-17 NOTE — Progress Notes (Signed)
Patient request to get out of bed to use bedside commode. Oxygen saturation decreased 76 % on non-rebreather/HHF. Pt returned to bed saturation improrved 90% on 40% HHFNC/non-rebreather. Will continue to monitor.

## 2020-11-17 NOTE — Progress Notes (Signed)
Inpatient Diabetes Program Recommendations  AACE/ADA: New Consensus Statement on Inpatient Glycemic Control (2015)  Target Ranges:  Prepandial:   less than 140 mg/dL      Peak postprandial:   less than 180 mg/dL (1-2 hours)      Critically ill patients:  140 - 180 mg/dL   Lab Results  Component Value Date   GLUCAP 76 11/17/2020   HGBA1C 6.1 (H) October 26, 2020    Review of Glycemic Control Results for ARRIAN, MANSON (MRN [id number]) as of 11/17/2020 10:42  Ref. Range 11/16/2020 07:40 11/16/2020 12:12 11/16/2020 16:55 11/16/2020 20:38 11/17/2020 08:18  Glucose-Capillary Latest Ref Range: 70 - 99 mg/dL 171 (H) 266 (H) 171 (H) 133 (H) 76   Current Orders: Lantus 14 units BID                            Novolog Moderate Correction Scale/ SSI (0-20 units) TID AC + HS                  Novolog 3 units tid meal coverage  Inpatient Diabetes Program Recommendations:   As steroids decrease: -Decrease Lantus to 10 units bid -Decrease Novolog correction to moderate 0-15 units tid + hs 0-5 units  Thank you, Bethena Roys E. Greysen Swanton, RN, MSN, CDE  Diabetes Coordinator Inpatient Glycemic Control Team Team Pager [phone] (8am-5pm) 11/17/2020 10:42 AM

## 2020-11-18 ENCOUNTER — Inpatient Hospital Stay (HOSPITAL_COMMUNITY): Payer: Medicare HMO

## 2020-11-18 DIAGNOSIS — Z7189 Other specified counseling: Secondary | ICD-10-CM

## 2020-11-18 DIAGNOSIS — F411 Generalized anxiety disorder: Secondary | ICD-10-CM | POA: Diagnosis not present

## 2020-11-18 DIAGNOSIS — U071 COVID-19: Secondary | ICD-10-CM | POA: Diagnosis not present

## 2020-11-18 DIAGNOSIS — I2699 Other pulmonary embolism without acute cor pulmonale: Secondary | ICD-10-CM | POA: Diagnosis not present

## 2020-11-18 DIAGNOSIS — J9601 Acute respiratory failure with hypoxia: Secondary | ICD-10-CM | POA: Diagnosis not present

## 2020-11-18 DIAGNOSIS — J1282 Pneumonia due to coronavirus disease 2019: Secondary | ICD-10-CM | POA: Diagnosis not present

## 2020-11-18 DIAGNOSIS — I482 Chronic atrial fibrillation, unspecified: Secondary | ICD-10-CM | POA: Diagnosis not present

## 2020-11-18 LAB — GLUCOSE, CAPILLARY
Glucose-Capillary: 121 mg/dL — ABNORMAL HIGH (ref 70–99)
Glucose-Capillary: 216 mg/dL — ABNORMAL HIGH (ref 70–99)
Glucose-Capillary: 144 mg/dL — ABNORMAL HIGH (ref 70–99)
Glucose-Capillary: 232 mg/dL — ABNORMAL HIGH (ref 70–99)

## 2020-11-18 LAB — CBC WITH DIFFERENTIAL/PLATELET
Abs Immature Granulocytes: 0.03 10*3/uL (ref 0.00–0.07)
Basophils Absolute: 0 10*3/uL (ref 0.0–0.1)
Basophils Relative: 0 %
Eosinophils Absolute: 0 10*3/uL (ref 0.0–0.5)
Eosinophils Relative: 0 %
HCT: 44.1 % (ref 39.0–52.0)
Hemoglobin: 14.8 g/dL (ref 13.0–17.0)
Immature Granulocytes: 1 %
Lymphocytes Relative: 5 %
Lymphs Abs: 0.2 10*3/uL — ABNORMAL LOW (ref 0.7–4.0)
MCH: 31.4 pg (ref 26.0–34.0)
MCHC: 33.6 g/dL (ref 30.0–36.0)
MCV: 93.6 fL (ref 80.0–100.0)
Monocytes Absolute: 0.1 10*3/uL (ref 0.1–1.0)
Monocytes Relative: 3 %
Neutro Abs: 3.7 10*3/uL (ref 1.7–7.7)
Neutrophils Relative %: 91 %
Platelets: 75 10*3/uL — ABNORMAL LOW (ref 150–400)
RBC: 4.71 MIL/uL (ref 4.22–5.81)
RDW: 14.4 % (ref 11.5–15.5)
WBC: 4 10*3/uL (ref 4.0–10.5)
nRBC: 0 % (ref 0.0–0.2)

## 2020-11-18 LAB — PROCALCITONIN: Procalcitonin: <0.1 ng/mL

## 2020-11-18 LAB — TROPONIN I (HIGH SENSITIVITY): Troponin I (High Sensitivity): 33 ng/L — ABNORMAL HIGH (ref <18)

## 2020-11-18 LAB — D-DIMER, QUANTITATIVE: D-Dimer, Quant: 16.02 ug/mL-FEU — ABNORMAL HIGH (ref 0.00–0.50)

## 2020-11-18 LAB — C-REACTIVE PROTEIN: CRP: 5.3 mg/dL — ABNORMAL HIGH (ref <1.0)

## 2020-11-18 MED ORDER — HYDROMORPHONE HCL 1 MG/ML IJ SOLN
0.2500 mg | Freq: Once | INTRAMUSCULAR | Status: AC
  Administered 2020-11-18: 0.25 mg via INTRAVENOUS
  Filled 2020-11-18: qty 0.5

## 2020-11-18 MED ORDER — HYDROMORPHONE HCL 1 MG/ML IJ SOLN
0.5000 mg | INTRAMUSCULAR | Status: DC | PRN
  Administered 2020-11-19 (×2): 0.5 mg via INTRAVENOUS
  Filled 2020-11-18 (×3): qty 0.5

## 2020-11-18 MED ORDER — HEPARIN (PORCINE) 25000 UT/250ML-% IV SOLN
1500.0000 [IU]/h | INTRAVENOUS | Status: DC
  Administered 2020-11-18: 1500 [IU]/h via INTRAVENOUS
  Filled 2020-11-18: qty 250

## 2020-11-18 NOTE — Progress Notes (Signed)
Goals of care/plan of care documentation:  The patient's d-dimer has become significantly elevated, even from previous abnormal baseline, indicating a PE is the likely cause. This is not confirmable due to his covid positive status and clinical instability, and comes in the setting of holding anticoagulation with dropping platelet count which does continue to decline. He has epistaxis and some hemoptysis. I ordered to start heparin in order to expedite therapy, though when I've had a chance to return to the patient's room he tells me he'd rather not use a blood thinner. He states he knows he's "not going to make it," and would rather be treated for the pain from the blood clot with dilaudid than actually treat the blood clot and risk worsening bleeding. This is in line with our prior conversations where we landed on the decision not to initiate pharmacologic DVT ppx.  The patient has remained of sound mind throughout my last few days of knowing him and frankly discussed his desire to "die in his sleep, not in pain." I confirmed that his clinical course including a 23 days hospitalization, nearly 3 weeks on heated high flow oxygen with a nonrebreather without appreciable improvement suggests a very poor prognosis with life expectancy numbered in hours-days. With that clinical picture and the patient's stated wish for essentially comfort measures, now with a suspected pulmonary embolism, we have agreed to transition essentially to comfort measures.  - Continue high level oxygen supplementation. - Allow visitation. He requests time to speak with his sister Lattie Haw who lives with pt's brother-in-law in [place], [place] and could visit today, and to call Barbie, with whom I have spoken at length today as well, to offer visitation. - Continue monitoring pulse oximetry and routine vital signs until visitation has been complete.  - DC medications not geared at comfort, specifically no anticoagulation.  - Increase dilaudid to  0.5mg  IV q2h prn. This can be titrated according to response and tolerance. I mentioned that once visitation is completed, we can start an infusion of dilaudid if he wishes.   This separate encounter was completed over the course of 50 minutes.  Vance Gather, MD Pager [phone] 11/18/2020 3:46 PM

## 2020-11-18 NOTE — Progress Notes (Signed)
   11/18/20 1111  Assess: MEWS Score  Temp 99 F (37.2 C)  BP 120/74  Pulse Rate 83  ECG Heart Rate 84  Resp (!) 28  Level of Consciousness Alert  SpO2 (!) 88 %  O2 Device HFNC  O2 Flow Rate (L/min) 35 L/min  FiO2 (%) 60 %  Assess: MEWS Score  MEWS Temp 0  MEWS Systolic 0  MEWS Pulse 0  MEWS RR 2  MEWS LOC 0  MEWS Score 2  MEWS Score Color Yellow  Assess: if the MEWS score is Yellow or Red  Were vital signs taken at a resting state? Yes  Focused Assessment Change from prior assessment (see assessment flowsheet)  Early Detection of Sepsis Score *See Row Information* Low  MEWS guidelines implemented *See Row Information* Yes  Treat  MEWS Interventions Escalated (See documentation below)  Take Vital Signs  Increase Vital Sign Frequency  Yellow: Q 2hr X 2 then Q 4hr X 2, if remains yellow, continue Q 4hrs  Escalate  MEWS: Escalate Yellow: discuss with charge nurse/RN and consider discussing with provider and RRT  Notify: Charge Nurse/RN  Name of Charge Nurse/RN Notified Ify RN  Date Charge Nurse/RN Notified 11/18/20  Time Charge Nurse/RN Notified 1357  Temp 98.6 F orally

## 2020-11-18 NOTE — Progress Notes (Signed)
[name]  [id number] DOB: 1951/06/11 DOA: 10/22/2020 PCP: [name], MD   Brief Narrative: Lawrence [name] is a 70 y.o. male with a history of PAF and aneurysm who presented 12/15 with covid pneumonia which has been treated with remdesivir, solumedrol, and refused immunomodulator treatment. He remains severely hypoxemic. Developed right sided chest pain on 11/18/2020 with abrupt rise in d-dimer. Clinical instability will not allow CTA chest. Empiric heparin gtt is started and repeat venous U/S ordered.  Assessment & Plan: Principal Problem:   Pneumonia due to COVID-19 virus Active Problems:   Atrial fibrillation, chronic (HCC)   Acute respiratory failure with hypoxia (HCC)   Hyperglycemia   Syncope and collapse   Anxiety state  Right chest pain: Abrupt onset this morning, associated with stable but very poor oxygenation. STAT labs I ordered include d-dimer now up to 16 from <1, troponin 33, ECG pending.  - Unable to perform CTA chest with current instability/HHF. Will recheck venous U/S (negative 12/21).  - Start heparin gtt empirically without bolus. Thrombocytopenia has limited VTE ppx, though this has been stable, >50k with no anemia/stable hgb 14.8g/dl today. HIT Ab negative.  Acute hypoxemic respiratory failure due to covid-19 pneumonia: - Remains severely hypoxemic. CXR 1/4 stable with bilateral infiltrates. Though duration of illness now surpasses 21 days, the severity of his respiratory failure remains such that isolation will be continued. - Completed remdesivir.  - Declined immunomodulator, was confirmed to have capacity to make medical decisions by medical team and psychiatry.  - Continue to steroids further today with durable resolution of inflammatory marker elevation and some hyperglycemia. - Encourage OOB, IS, FV, and awake proning if able  Steroid induced hyperglycemia: Improving with tapering steroid doses. - Decrease basal insulin with fasting  CBG in 70's. Postprandials still elevated, so will not change doses.   Thrombocytopenia: No schistocytes on smear. HIT Ab negative.  - Stable. Confirmed by citrate and EDTA tubes. - Risk < benefit of anticoagulation at this time.  Chronic atrial fibrillation:  - Continue diltiazem - Declines chronic anticoagulation  HLD:  - Holding statin  LFT elevation: Mild - Hold statin, monitor intermittently  Prolonged QT interval:  - Monitoring on telemetry.   Epistaxis: Mild.  - Nasal saline, afrin. - Monitor with initiation of AC  DVT prophylaxis: Heparin gtt Code Status: DNR Family Communication: None at bedside. Has not requested a call. Will see if he will allow me to call his sister. Disposition Plan:  Status is: Inpatient - Continued guarded prognosis  Remains inpatient appropriate because:Inpatient level of care appropriate due to severity of illness  Dispo:  Patient From: Home  Planned Disposition: Home with Health Care Svc  Expected discharge date: 11/24/2020  Medically stable for discharge: No  Consultants:   PCCM  Psychiatry  Procedures:   None  Antimicrobials:  Remdesivir   Subjective: Developed abrupt onset right anterior chest pain that is moderate, nonradiating, worse with deep breaths. He was not doing anything when it happened. Shortness of breath is moderate-severe, about the same, improved with HHF O2. Using NRB more often.   Objective: Vitals:   11/18/20 0529 11/18/20 0930 11/18/20 1111 11/18/20 1113  BP: 119/80  120/74   Pulse:   83   Resp:   (!) 28   Temp: 97.7 F (36.5 C)  99 F (37.2 C) 98.6 F (37 C)  TempSrc: Oral  Axillary Oral  SpO2: 91% 94% (!) 88%   Weight:  Height:        Intake/Output Summary (Last 24 hours) at 11/18/2020 1238 Last data filed at 11/18/2020 1113 Gross per 24 hour  Intake 240 ml  Output 1225 ml  Net -985 ml   Filed Weights   11-08-2020 1322  Weight: 97.1 kg   Gen: 70 y.o. male in no distress Pulm:  Mildly labored, SpO2 in high 80%'s on HHF, too weak to rise for exam, needs assistance to turn. Crackles stable. No wheezes. Aeration in all fields. CV: Regular rate and rhythm. No murmur, rub, or gallop. No JVD, no dependent edema. GI: Abdomen soft, non-tender, non-distended, with normoactive bowel sounds.  Ext: Warm, no deformities Skin: No rashes, lesions or ulcers on visualized skin. Neuro: Alert and oriented. No focal neurological deficits. Psych: Judgement and insight appear fair. Mood euthymic & affect congruent. Behavior is appropriate.    Data Reviewed: I have personally reviewed following labs and imaging studies  CBC: Recent Labs  Lab 11/14/20 0418 11/14/20 0931 11/15/20 0709 11/16/20 1341 11/18/20 1145  WBC 13.2* 14.1* 10.3 10.9* 4.0  NEUTROABS  --  13.6* 10.0*  --  3.7  HGB 14.7 16.5 14.7 13.9 14.8  HCT 42.8 48.1 43.2 40.6 44.1  MCV 90.7 91.3 93.1 92.1 93.6  PLT 92* 98* 82* 79* 75*   Basic Metabolic Panel: Recent Labs  Lab 11/12/20 0453 11/13/20 0502 11/14/20 0418 11/15/20 0709 11/16/20 1341  NA 138 137 137 137 137  K 4.5 4.3 4.1 4.2 4.1  CL 103 105 103 103 103  CO2 27 26 25 28 26   GLUCOSE 202* 176* 147* 145* 220*  BUN 42* 45* 44* 39* 39*  CREATININE 0.79 0.93 0.73 0.71 0.75  CALCIUM 8.5* 8.1* 8.2* 8.0* 8.0*  MG  --   --   --  2.2  --    GFR: Estimated Creatinine Clearance: 105 mL/min (by C-G formula based on SCr of 0.75 mg/dL).   Liver Function Tests: Recent Labs  Lab 11/12/20 0453 11/13/20 0502 11/14/20 0418 11/15/20 0709  AST 19 19 21 23   ALT 52* 53* 57* 70*  ALKPHOS 57 50 47 45  BILITOT 0.9 0.9 0.8 1.2  PROT 5.0* 4.6* 4.4* 4.4*  ALBUMIN 2.6* 2.2* 2.2* 2.2*   Coagulation Profile: Recent Labs  Lab 11/14/20 0418  INR 1.2   CBG: Recent Labs  Lab 11/17/20 1149 11/17/20 1652 11/17/20 2103 11/18/20 0734 11/18/20 1108  GLUCAP 197* 291* 228* 121* 216*   Anemia Panel: No results for input(s): VITAMINB12, FOLATE, FERRITIN, TIBC, IRON,  RETICCTPCT in the last 72 hours. Urine analysis:    Component Value Date/Time   COLORURINE YELLOW 11-08-20 1900   APPEARANCEUR HAZY (A) 08-Nov-2020 1900   LABSPEC 1.032 (H) 2020/11/08 1900   PHURINE 5.0 Nov 08, 2020 1900   GLUCOSEU 50 (A) 11-08-20 1900   HGBUR NEGATIVE 2020-11-08 1900   BILIRUBINUR NEGATIVE 11/08/20 1900   KETONESUR 20 (A) 11-08-2020 1900   PROTEINUR 100 (A) 11/08/2020 1900   UROBILINOGEN 0.2 06/17/2013 1012   NITRITE NEGATIVE 2020-11-08 1900   LEUKOCYTESUR NEGATIVE 08-Nov-2020 1900    Scheduled Meds: . albuterol  2 puff Inhalation TID  . atorvastatin  20 mg Oral Daily  . diltiazem  120 mg Oral Daily  .  HYDROmorphone (DILAUDID) injection  0.25 mg Intravenous Once  . insulin aspart  0-20 Units Subcutaneous TID WC  . insulin aspart  0-5 Units Subcutaneous QHS  . insulin aspart  3 Units Subcutaneous TID WC  . insulin glargine  8 Units  Subcutaneous BID  . methylPREDNISolone (SOLU-MEDROL) injection  40 mg Intravenous Daily  . oxymetazoline  1 spray Each Nare BID  . pantoprazole  40 mg Oral BID AC  . sodium chloride flush  10-40 mL Intracatheter Q12H  . sodium chloride flush  3 mL Intravenous Q12H   Continuous Infusions: . sodium chloride       LOS: 24 days   Time spent: 35 minutes.  [name], MD [facility] [email or web address] 11/18/2020, 12:38 PM

## 2020-11-18 NOTE — Plan of Care (Signed)
  Problem: Pain Managment: Goal: General experience of comfort will improve Outcome: Progressing   Problem: Clinical Measurements: Goal: Respiratory complications will improve Outcome: Not Progressing   Problem: Activity: Goal: Risk for activity intolerance will decrease Outcome: Not Progressing

## 2020-11-18 NOTE — Progress Notes (Signed)
ANTICOAGULATION CONSULT NOTE - Initial Consult  Pharmacy Consult for heparin Indication: pulmonary embolus suspected  Allergies  Allergen Reactions  . Ciprofloxacin   . Morphine And Related Other (See Comments)    Caused patient to pass out.     Patient Measurements: Height: 5\' 11"  (180.3 cm) Weight: 97.1 kg (214 lb) IBW/kg (Calculated) : 75.3 Heparin Dosing Weight: 95 kg  Vital Signs: Temp: 98.3 F (36.8 C) (01/08 1259) Temp Source: Oral (01/08 1259) BP: 124/79 (01/08 1259) Pulse Rate: 81 (01/08 1259)  Labs: Recent Labs    11/16/20 1341 11/18/20 1145  HGB 13.9 14.8  HCT 40.6 44.1  PLT 79* 75*  CREATININE 0.75  --   TROPONINIHS  --  33*    Estimated Creatinine Clearance: 105 mL/min (by C-G formula based on SCr of 0.75 mg/dL).   Medical History: Past Medical History:  Diagnosis Date  . Aortic atherosclerosis ([place])   . Aortic insufficiency   . Aortic stenosis   . Arthritis   . Bicuspid aortic valve    a. mild AS, mild-mod AI by echo 03/2017, 5cm thoracic aortic aneurysm by CT 07/2017.  . Essential hypertension   . GERD (gastroesophageal reflux disease)   . Gout   . Postoperative atrial fibrillation ([place])   . Thoracic aortic aneurysm ([place])    a. 07/2017: 5cm by CT.    Medications: No anticoagulants PTA  Assessment: Pt is a 70 year old male admitted with COVID PNA. Pharmacy consulted to dose/monitor heparin drip for suspected PE. D-dimer elevated, unable to perform CTA.   Pt was previously on enoxaparin 40 mg subQ daily for DVT ppx, which was discontinued on 1/4 for thrombocytopenia. HIT antibody negative.   -12/21: venous doppler: negative -1/8: venous doppler: ordered  Today, 11/18/20  CBC: Hgb WNL; Plt (75) remain low but stable  D-dimer: 16.02 (increased)  SCr WNL  Goal of Therapy:  Heparin level 0.3-0.7 units/ml Monitor platelets by anticoagulation protocol: Yes   Plan:   No heparin bolus given thrombocytopenia (discussed with  MD)  Initiate heparin infusion at 1500 units/hr  Check 6 hour HL  CBC, HL daily while on heparin infusion  Monitor for signs of bleeding  Lenis Noon, PharmD 11/18/2020,1:04 PM

## 2020-11-19 ENCOUNTER — Encounter (HOSPITAL_COMMUNITY): Payer: Medicare HMO

## 2020-11-19 DIAGNOSIS — F411 Generalized anxiety disorder: Secondary | ICD-10-CM | POA: Diagnosis not present

## 2020-11-19 DIAGNOSIS — J9601 Acute respiratory failure with hypoxia: Secondary | ICD-10-CM | POA: Diagnosis not present

## 2020-11-19 DIAGNOSIS — J1282 Pneumonia due to coronavirus disease 2019: Secondary | ICD-10-CM | POA: Diagnosis not present

## 2020-11-19 DIAGNOSIS — Z515 Encounter for palliative care: Secondary | ICD-10-CM

## 2020-11-19 DIAGNOSIS — I482 Chronic atrial fibrillation, unspecified: Secondary | ICD-10-CM | POA: Diagnosis not present

## 2020-11-19 DIAGNOSIS — U071 COVID-19: Secondary | ICD-10-CM | POA: Diagnosis not present

## 2020-11-19 LAB — GLUCOSE, CAPILLARY: Glucose-Capillary: 144 mg/dL — ABNORMAL HIGH (ref 70–99)

## 2020-11-19 MED ORDER — SODIUM CHLORIDE 0.9 % IV SOLN: 250.0000 mL | INTRAVENOUS | Status: DC | PRN

## 2020-11-19 MED ORDER — LORAZEPAM 2 MG/ML IJ SOLN: 0.2500 mg | INTRAMUSCULAR | Status: DC | PRN

## 2020-11-19 MED ORDER — HALOPERIDOL LACTATE 2 MG/ML PO CONC
0.5000 mg | ORAL | Status: DC | PRN
  Filled 2020-11-19: qty 0.3

## 2020-11-19 MED ORDER — SODIUM CHLORIDE 0.9% FLUSH: 3.0000 mL | INTRAVENOUS | Status: DC | PRN

## 2020-11-19 MED ORDER — HALOPERIDOL 0.5 MG PO TABS
0.5000 mg | ORAL_TABLET | ORAL | Status: DC | PRN
  Filled 2020-11-19: qty 1

## 2020-11-19 MED ORDER — LORAZEPAM 1 MG PO TABS: 1.0000 mg | ORAL_TABLET | ORAL | Status: DC | PRN

## 2020-11-19 MED ORDER — LORAZEPAM 2 MG/ML PO CONC
1.0000 mg | ORAL | Status: DC | PRN
  Filled 2020-11-19: qty 0.5

## 2020-11-19 MED ORDER — HALOPERIDOL LACTATE 5 MG/ML IJ SOLN
0.5000 mg | INTRAMUSCULAR | Status: DC | PRN
  Administered 2020-11-20: 0.5 mg via INTRAVENOUS
  Filled 2020-11-19: qty 1

## 2020-11-19 MED ORDER — SODIUM CHLORIDE 0.9% FLUSH: 3.0000 mL | Freq: Two times a day (BID) | INTRAVENOUS | Status: DC

## 2020-11-19 MED ORDER — SODIUM CHLORIDE 0.9 % IV SOLN
2.0000 mg/h | INTRAVENOUS | Status: DC
  Administered 2020-11-20 (×2): 2 mg/h via INTRAVENOUS
  Administered 2020-11-21: 3 mg/h via INTRAVENOUS
  Filled 2020-11-19 (×7): qty 2.5

## 2020-11-19 MED ORDER — DIPHENHYDRAMINE HCL 50 MG/ML IJ SOLN: 25.0000 mg | Freq: Four times a day (QID) | INTRAMUSCULAR | Status: DC | PRN

## 2020-11-19 MED ORDER — ONDANSETRON HCL 4 MG/2ML IJ SOLN: 4.0000 mg | Freq: Four times a day (QID) | INTRAMUSCULAR | Status: DC | PRN

## 2020-11-19 MED ORDER — BIOTENE DRY MOUTH MT LIQD: 15.0000 mL | OROMUCOSAL | Status: DC | PRN

## 2020-11-19 MED ORDER — GLYCOPYRROLATE 0.2 MG/ML IJ SOLN: 0.2000 mg | INTRAMUSCULAR | Status: DC | PRN

## 2020-11-19 MED ORDER — HYDROMORPHONE BOLUS VIA INFUSION
2.0000 mg | INTRAVENOUS | Status: DC | PRN
  Administered 2020-11-20: 2 mg via INTRAVENOUS
  Filled 2020-11-19: qty 2

## 2020-11-19 MED ORDER — GLYCOPYRROLATE 0.2 MG/ML IJ SOLN
0.2000 mg | INTRAMUSCULAR | Status: DC | PRN
  Filled 2020-11-19: qty 1

## 2020-11-19 MED ORDER — POLYVINYL ALCOHOL 1.4 % OP SOLN
1.0000 [drp] | Freq: Four times a day (QID) | OPHTHALMIC | Status: DC | PRN
  Filled 2020-11-19: qty 15

## 2020-11-19 MED ORDER — LORAZEPAM 2 MG/ML IJ SOLN
0.2500 mg | Freq: Two times a day (BID) | INTRAMUSCULAR | Status: DC
  Administered 2020-11-19: 0.25 mg via INTRAVENOUS
  Filled 2020-11-19: qty 1

## 2020-11-19 MED ORDER — GLYCOPYRROLATE 1 MG PO TABS
1.0000 mg | ORAL_TABLET | ORAL | Status: DC | PRN
  Filled 2020-11-19: qty 1

## 2020-11-19 MED ORDER — LORAZEPAM 2 MG/ML IJ SOLN
1.0000 mg | INTRAMUSCULAR | Status: DC | PRN
  Filled 2020-11-19 (×2): qty 1

## 2020-11-19 MED ORDER — ONDANSETRON 4 MG PO TBDP: 4.0000 mg | ORAL_TABLET | Freq: Four times a day (QID) | ORAL | Status: DC | PRN

## 2020-11-19 NOTE — Plan of Care (Signed)
  Problem: Pain Managment: Goal: General experience of comfort will improve Outcome: Progressing   Problem: Coping: Goal: Psychosocial and spiritual needs will be supported Outcome: Progressing   Problem: Clinical Measurements: Goal: Respiratory complications will improve Outcome: Not Progressing   Problem: Activity: Goal: Risk for activity intolerance will decrease Outcome: Not Progressing

## 2020-11-19 NOTE — Progress Notes (Signed)
Chaplain responded to a request from patient RN about paperwork to be filled out.  Chaplain arrived and touched base with [employer] about how we are handling notary on a weekend.  Turns out patient family want to complete/write out some sort of last will and testament. Chaplain has connected with AC who is going to talk to the family.  Chaplain available as needed for further support. [place], Mdiv.    11/19/20 1500  Clinical Encounter Type  Visited With Health care provider  Visit Type Other (Comment) (Patient/family wanting paperwork notorized)  Referral From Nurse  Consult/Referral To Chaplain

## 2020-11-19 NOTE — Consult Note (Addendum)
Consultation Note Date: 11/19/2020   Patient Name: Lawrence [name]  DOB: January 20, 1951  MRN: [id number]  Age / Sex: 70 y.o., male  PCP: [name], MD Referring Physician: [name], MD  Reason for Consultation: Establishing goals of care  HPI/Patient Profile: 70 y.o. male  with past medical history of atrial fibrillation, aortic stenosis, thoracic aneurysm, who was admitted on 10/13/2020 with possible syncope and collapse.  He was found to be positive for COVID 19 on 12/15. He was hypoxic, and tacycardic, CXR was suspicious for pneumonia.  Since he has been treated with remdesivir and solumedrol.  He refused immunomodulator treatment and intubation.  On 11/18/20 he developed severe right sided chest pain.  He is unable to have a CTA but he is presumed to have a PE.  He was seen by psychiatry who deemed him to have capacity to make his own decisions.  Patient is currently on 50L heated high flow and a non rebreather mask with another 15L of oxygen.     Clinical Assessment and Goals of Care:  I have reviewed medical records including EPIC notes, labs and imaging, received report from Dr. Bonner Puna, examined the patient and met at bedside with him to discuss diagnosis prognosis, GOC, EOL wishes, disposition and options.  I introduced Palliative Medicine as specialized medical care for people living with serious illness. It focuses on providing relief from the symptoms and stress of a serious illness.   He talks with me about how he is not going to make it and that he can't even have a bowel movement because the movement and energy would take his life.  He tells me everyone who is going to visit has come to visit him.  He tries to explain how he has taken care of his affairs - planning to sell his car, giving away his furniture, etc.. but it is difficult to understand everything he is saying the the mask and the high flow.   He tells me he wants to try to make it thru the night until tomorrow morning.  Tomorrow morning he asks that we start medicine that will allow him to sleep thru having the oxygen weaned down.  It is very important to him that he does not suffer and he does not experience SOB or pain.  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  He believes there is a better place where he will see his family again.  I questioned why the change in plan and the decision to wait until tomorrow morning?  He said because Dr. Bonner Puna will be present then.  He feels more secure knowing that Dr. Bonner Puna will be close by.  I let that patient know that I had placed orders for low dose pain medication and anxiety medication if he needed it overnight.  Also that the orders for the drip would be there so if he decides he wants to start the drip earlier he can do so.  Questions and concerns  were addressed.  The family was encouraged to call with questions or concerns.    Primary Decision Maker:  PATIENT    SUMMARY OF RECOMMENDATIONS    Comfort orders placed. Discussed plan with Dr. Bonner Puna and RN. Patient will have PRNs available tonight if needed. Orders for continuous infusion of dilaudid with PRN boluses is also in place and can be started at the patient's direction. Very low dose ativan ordered BID (0.25 mg) scheduled. Called patient's sister to attempt to answer any questions.   Code Status/Advance Care Planning:  DNR   Symptom Management:   As above.  Additional Recommendations (Limitations, Scope, Preferences):  Full Comfort Care  Palliative Prophylaxis:   Frequent Pain Assessment, frequent dyspnea assessment.  Psycho-social/Spiritual:   Desire for further Chaplaincy support: welcomed.  Prognosis: hours.    Discharge Planning: Anticipated Hospital Death      Primary Diagnoses: Present on Admission: . Pneumonia due to COVID-19 virus . Anxiety state   I have  reviewed the medical record, interviewed the patient and family, and examined the patient. The following aspects are pertinent.  Past Medical History:  Diagnosis Date  . Aortic atherosclerosis ([place])   . Aortic insufficiency   . Aortic stenosis   . Arthritis   . Bicuspid aortic valve    a. mild AS, mild-mod AI by echo 03/2017, 5cm thoracic aortic aneurysm by CT 07/2017.  . Essential hypertension   . GERD (gastroesophageal reflux disease)   . Gout   . Postoperative atrial fibrillation ([place])   . Thoracic aortic aneurysm ([place])    a. 07/2017: 5cm by CT.   Social History   Socioeconomic History  . Marital status: Widowed    Spouse name: Not on file  . Number of children: Not on file  . Years of education: Not on file  . Highest education level: Not on file  Occupational History  . Not on file  Tobacco Use  . Smoking status: Former Smoker    Packs/day: 2.50    Years: 25.00    Pack years: 62.50    Types: Cigarettes    Quit date: 08/01/1981    Years since quitting: 39.3  . Smokeless tobacco: Never Used  Vaping Use  . Vaping Use: Never used  Substance and Sexual Activity  . Alcohol use: Yes    Comment: occ  . Drug use: No  . Sexual activity: Yes    Birth control/protection: None  Other Topics Concern  . Not on file  Social History Narrative  . Not on file   Social Determinants of Health   Financial Resource Strain: Not on file  Food Insecurity: Not on file  Transportation Needs: Not on file  Physical Activity: Not on file  Stress: Not on file  Social Connections: Not on file   Family History  Problem Relation Age of Onset  . Colon polyps Sister   . Heart attack Mother   . Stomach cancer Father   . Colon cancer Neg Hx     Allergies  Allergen Reactions  . Ciprofloxacin   . Morphine And Related Other (See Comments)    Caused patient to pass out.       Vital Signs: BP 125/77   Pulse 93   Temp 98.2 F (36.8 C) (Oral)   Resp (!) 24   Ht '5\' 11"'  (1.803 m)    Wt 97.1 kg   SpO2 (!) 85%   BMI 29.85 kg/m  Pain Scale: 0-10 POSS *See Group Information*: 1-Acceptable,Awake and alert  Pain Score: 5    SpO2: SpO2: (!) 85 % O2 Device:SpO2: (!) 85 % O2 Flow Rate: .O2 Flow Rate (L/min): 35 L/min    Palliative Assessment/Data: 30%     Time In: 5:30 Time Out: 6:30 Time Total: 60 min. Visit consisted of counseling and education dealing with the complex and emotionally intense issues surrounding the need for palliative care and symptom management in the setting of serious and potentially life-threatening illness. Greater than 50%  of this time was spent counseling and coordinating care related to the above assessment and plan.  Signed by: Florentina Jenny, PA-C Palliative Medicine  Please contact Palliative Medicine Team phone at [phone] for questions and concerns.  For individual provider: See Shea Evans

## 2020-11-19 NOTE — Progress Notes (Signed)
PROGRESS NOTE  Lawrence [name]  QQV:[id number] DOB: 20-Oct-1951 DOA: 11/01/20 PCP: [name], MD   Brief Narrative: Lawrence [name] is a 70 y.o. male with a history of PAF and aneurysm who presented 12/15 with covid pneumonia which has been treated with remdesivir, solumedrol, and refused immunomodulator treatment. He remains severely hypoxemic. Developed right sided chest pain on 11/18/2020 with abrupt rise in d-dimer. Clinical instability will not allow CTA chest. Empiric heparin gtt is started and repeat venous U/S ordered.  Assessment & Plan: Principal Problem:   Pneumonia due to COVID-19 virus Active Problems:   Atrial fibrillation, chronic (HCC)   Acute respiratory failure with hypoxia (HCC)   Hyperglycemia   Syncope and collapse   Anxiety state  Acute hypoxemic respiratory failure due to covid-19 pneumonia: - Remains severely hypoxemic. CXR 1/4 stable with bilateral infiltrates. Though duration of illness now surpasses 21 days, the severity of his respiratory failure remains such that isolation will be continued. - Completed remdesivir.  - Declined immunomodulator, was confirmed to have capacity to make medical decisions by medical team and psychiatry.  - DC steroids.   Suspect acute pulmonary embolism: Abrupt onset right chest pain, abrupt rise in d-dimer, with severe hypoxemia in covid patient who has not been anticoagulated due to thrombocytopenia. Too unstable for confirmatory testing.  - After lengthy goals of care discussions, pt opts to forego anticoagulation and transition to comfort-based treatment.  - Dilaudid prn chest discomfort/dyspnea.   Steroid induced hyperglycemia: Improving with tapering steroid doses. - DC CBGs and SSI  Thrombocytopenia: No schistocytes on smear. HIT Ab negative.  - Stable. Confirmed by citrate and EDTA tubes.  Chronic atrial fibrillation:  - Continue diltiazem - Declined chronic anticoagulation  HLD:  - Holding statin  LFT  elevation: Mild - Hold statin   Prolonged QT interval:  - DC tele monitoring after family visitation.   Epistaxis: Mild.  - Nasal saline, afrin.  DVT prophylaxis: None Code Status: DNR Family Communication: Will call sister again, pt confirms he is ok with me contacting family. Family will visit later today.  Disposition Plan:  Status is: Inpatient. Pt is not stable enough for transfer to residential hospice.   Remains inpatient appropriate because:Inpatient level of care appropriate due to severity of illness  Dispo:  Patient From: Home  Planned Disposition: Anticipate hospital demise  Expected discharge date: 11/24/2020  Medically stable for discharge: No  Consultants:   PCCM  Psychiatry  Procedures:   None  Antimicrobials:  Remdesivir   Subjective: Still with fairly constant right lower chest pain worse with deep breathing or cough. Trying not to eat too much because he believes if he has to exert himself to have a BM he's "not going to make it." He confirms he is looking forward to family visiting to say goodbye and then would like to have no chest pain or shortness of breath.  Objective: Vitals:   11/19/20 0425 11/19/20 0559 11/19/20 0910 11/19/20 1200  BP:  122/74  125/77  Pulse:  64 79 76  Resp:  (!) 22 20 (!) 24  Temp:  97.8 F (36.6 C)  98.2 F (36.8 C)  TempSrc:  Oral  Oral  SpO2: 94% (!) 88% 90% 93%  Weight:      Height:        Intake/Output Summary (Last 24 hours) at 11/19/2020 1342 Last data filed at 11/19/2020 1135 Gross per 24 hour  Intake 840 ml  Output 850 ml  Net -10 ml  Filed Weights   2020/11/18 1322  Weight: 97.1 kg   Gen: 70 y.o. male in no distress Pulm: Nonlabored at rest with HHFNC. CV: Regular rate and rhythm. No murmur, rub, or gallop. No JVD, no dependent edema. GI: Abdomen soft, non-tender, non-distended, with normoactive bowel sounds.  Ext: Warm, no deformities Skin: No rashes, lesions or ulcers on visualized skin. Neuro:  Alert and oriented. No focal neurological deficits. Psych: Judgement and insight appear fair. Certainly has capacity to make his own informed medical decisions. Mood is euthymic & affect congruent. Behavior is appropriate.    Data Reviewed: I have personally reviewed following labs and imaging studies  CBC: Recent Labs  Lab 11/14/20 0418 11/14/20 0931 11/15/20 0709 11/16/20 1341 11/18/20 1145  WBC 13.2* 14.1* 10.3 10.9* 4.0  NEUTROABS  --  13.6* 10.0*  --  3.7  HGB 14.7 16.5 14.7 13.9 14.8  HCT 42.8 48.1 43.2 40.6 44.1  MCV 90.7 91.3 93.1 92.1 93.6  PLT 92* 98* 82* 79* 75*   Basic Metabolic Panel: Recent Labs  Lab 11/13/20 0502 11/14/20 0418 11/15/20 0709 11/16/20 1341  NA 137 137 137 137  K 4.3 4.1 4.2 4.1  CL 105 103 103 103  CO2 26 25 28 26   GLUCOSE 176* 147* 145* 220*  BUN 45* 44* 39* 39*  CREATININE 0.93 0.73 0.71 0.75  CALCIUM 8.1* 8.2* 8.0* 8.0*  MG  --   --  2.2  --    GFR: Estimated Creatinine Clearance: 105 mL/min (by C-G formula based on SCr of 0.75 mg/dL).   Liver Function Tests: Recent Labs  Lab 11/13/20 0502 11/14/20 0418 11/15/20 0709  AST 19 21 23   ALT 53* 57* 70*  ALKPHOS 50 47 45  BILITOT 0.9 0.8 1.2  PROT 4.6* 4.4* 4.4*  ALBUMIN 2.2* 2.2* 2.2*   Coagulation Profile: Recent Labs  Lab 11/14/20 0418  INR 1.2   CBG: Recent Labs  Lab 11/18/20 0734 11/18/20 1108 11/18/20 1556 11/18/20 2056 11/19/20 0745  GLUCAP 121* 216* 144* 232* 144*   Urine analysis:    Component Value Date/Time   COLORURINE YELLOW Nov 18, 2020 1900   APPEARANCEUR HAZY (A) 11/18/2020 1900   LABSPEC 1.032 (H) 11/18/2020 1900   PHURINE 5.0 18-Nov-2020 1900   GLUCOSEU 50 (A) 18-Nov-2020 1900   HGBUR NEGATIVE November 18, 2020 1900   BILIRUBINUR NEGATIVE 18-Nov-2020 1900   KETONESUR 20 (A) 2020/11/18 1900   PROTEINUR 100 (A) Nov 18, 2020 1900   UROBILINOGEN 0.2 06/17/2013 1012   NITRITE NEGATIVE 18-Nov-2020 1900   LEUKOCYTESUR NEGATIVE 11-18-2020 1900    Scheduled  Meds: . albuterol  2 puff Inhalation TID  . diltiazem  120 mg Oral Daily  . oxymetazoline  1 spray Each Nare BID  . pantoprazole  40 mg Oral BID AC  . sodium chloride flush  10-40 mL Intracatheter Q12H   Continuous Infusions:    LOS: 25 days   Time spent: 35 minutes.  [name], MD [facility] [email or web address] 11/19/2020, 1:42 PM

## 2020-11-19 NOTE — Progress Notes (Signed)
Pt tested positive for COVID on 12/15. He is at 25 days

## 2020-11-19 NOTE — Plan of Care (Signed)
  Problem: Nutrition: Goal: Adequate nutrition will be maintained Outcome: Progressing   Problem: Pain Managment: Goal: General experience of comfort will improve Outcome: Progressing   Problem: Coping: Goal: Psychosocial and spiritual needs will be supported Outcome: Progressing   Problem: Respiratory: Goal: Will maintain a patent airway Outcome: Progressing

## 2020-11-20 DIAGNOSIS — U071 COVID-19: Secondary | ICD-10-CM | POA: Diagnosis not present

## 2020-11-20 DIAGNOSIS — I482 Chronic atrial fibrillation, unspecified: Secondary | ICD-10-CM | POA: Diagnosis not present

## 2020-11-20 DIAGNOSIS — F411 Generalized anxiety disorder: Secondary | ICD-10-CM | POA: Diagnosis not present

## 2020-11-20 DIAGNOSIS — J9601 Acute respiratory failure with hypoxia: Secondary | ICD-10-CM | POA: Diagnosis not present

## 2020-11-20 MED ORDER — LORAZEPAM 2 MG/ML IJ SOLN
1.0000 mg | Freq: Three times a day (TID) | INTRAMUSCULAR | Status: DC
  Administered 2020-11-20 – 2020-11-21 (×3): 1 mg via INTRAVENOUS
  Filled 2020-11-20 (×2): qty 1

## 2020-11-20 MED ORDER — GLYCOPYRROLATE 0.2 MG/ML IJ SOLN
0.4000 mg | Freq: Three times a day (TID) | INTRAMUSCULAR | Status: DC
  Administered 2020-11-20 – 2020-11-21 (×3): 0.4 mg via INTRAVENOUS
  Filled 2020-11-20 (×2): qty 2

## 2020-11-20 NOTE — Progress Notes (Signed)
  PROGRESS NOTE  [name]  BHA:[id number] DOB: Oct 08, 1951 DOA: 11/17/20 PCP: [name], MD   Brief Narrative: Lawrence [name] is a 70 y.o. male with a history of PAF and aneurysm who presented 12/15 with covid pneumonia which has been treated with remdesivir, solumedrol, and refused immunomodulator treatment. He remains severely hypoxemic. Developed right sided chest pain on 11/18/2020 with abrupt rise in d-dimer. Clinical instability will not allow CTA chest. Empiric heparin gtt is started and repeat venous U/S ordered.  Assessment & Plan: Principal Problem:   Pneumonia due to COVID-19 virus Active Problems:   Atrial fibrillation, chronic (HCC)   Acute respiratory failure with hypoxia (HCC)   Hyperglycemia   Syncope and collapse   Anxiety state  Acute hypoxemic respiratory failure due to covid-19 pneumonia: - Remains severely hypoxemic despite maximal medical therapy. Has been on heated high flow oxygen for >21 days without improvement, then developed PE (putative Dx, unable to perform CT due to instability) on 1/8. Has persistently declined offer for intubation and after numerous goals of care discussions the patient has insistently and consistently requested comfort measures, dilaudid infusion is underway. Also will get anxiolytic and treatment for secretions. I appreciate palliative care assistance in this case.   Urinary retention:  - Foley  DVT prophylaxis: None Code Status: DNR Family Communication: Family at bedside multiple times yesterday. Disposition Plan:  Status is: Inpatient. Pt is not stable enough for transfer to residential hospice.   Remains inpatient appropriate because:Inpatient level of care appropriate due to severity of illness  Dispo:  Patient From: Home  Planned Disposition: Anticipate hospital demise  Expected discharge date: 11/20/2020  Medically stable for discharge: No  Consultants:   PCCM  Psychiatry  Procedures:    None  Antimicrobials:  Remdesivir   Subjective: Pt opted to defer dilaudid infusion to this morning, did receive haldol last night and now having severe difficulty urinating with increased discomfort in lower abd/pelvis. Shortness of breath is stable, ready to go on dilaudid infusion.   Reassessed after foley inserted and pt comfortable, no pain or dyspnea. Has said goodbyes to family.   Objective: BP (!) 139/95  Pulse 103   Temp 98.4 F (36.9 C)  Resp (!) 24 SpO2 90%  Gen: 70 y.o. male in apparent discomfort Pulm: Nonlabored with HHF, SpO2 in 80%'s. Tachypneic CV: Regular borderline tachycardia. No edema, JVD. GI: Abdomen soft, +suprapubic fullness and tenderness. Ext: Warm, no deformities Skin: No new rashes, lesions or ulcers on visualized skin. Neuro: Alert and oriented. No focal neurological deficits. Psych: Judgement and insight appear fair. Mood euthymic & affect congruent. Behavior is appropriate.    Continuous Infusions: . HYDROmorphone 2 mg/hr (11/20/20 1004)     LOS: 26 days   Time spent: 35 minutes.  [name], MD [facility] [email or web address] 11/20/2020, 11:10 AM

## 2020-11-20 NOTE — Progress Notes (Addendum)
Talked with patient's RN a couple of times today.  He is comfortable and not awake.  He was most recently on 30L but RT just changed the rate.    Earlier today I scheduled ativan TID and scheduled an increased dose of robinul to help manage secretions.  I requested that Sharrie Rothman increase the gtt to 3 mg / hour as I made him a promise that he would not suffer and he would not feel SOB.  Lorella Nimrod to continue to titrate down the oxygen and to carefully look for any signs of distress.   Also advised that his oxygen sat no longer needs to be monitored.  Sharrie Rothman mentioned that she likes to be able to see it.  Florentina Jenny, PA-C Palliative Medicine Office:  [phone]  No charge note.

## 2020-11-20 NOTE — Plan of Care (Signed)
  Problem: Coping: Goal: Level of anxiety will decrease Outcome: Progressing   Problem: Pain Managment: Goal: General experience of comfort will improve Outcome: Progressing   Problem: Coping: Goal: Psychosocial and spiritual needs will be supported Outcome: Progressing   Problem: Respiratory: Goal: Will maintain a patent airway Outcome: Progressing

## 2020-11-20 NOTE — Progress Notes (Signed)
Pt alert & awake, given 1x dose of PRN haldol for mild agitation, offered continouos IV drip dilaudid  but  pt states that he will wait  for Dr. Bonner Puna to come and see him this morning and to start the medication in his presence and plan of care continues.

## 2020-11-21 DIAGNOSIS — R7989 Other specified abnormal findings of blood chemistry: Secondary | ICD-10-CM | POA: Diagnosis present

## 2020-11-21 DIAGNOSIS — R0602 Shortness of breath: Secondary | ICD-10-CM

## 2020-11-21 DIAGNOSIS — E785 Hyperlipidemia, unspecified: Secondary | ICD-10-CM | POA: Diagnosis present

## 2020-11-21 DIAGNOSIS — D6869 Other thrombophilia: Secondary | ICD-10-CM | POA: Diagnosis present

## 2020-11-21 DIAGNOSIS — R9431 Abnormal electrocardiogram [ECG] [EKG]: Secondary | ICD-10-CM | POA: Diagnosis present

## 2020-11-21 DIAGNOSIS — D696 Thrombocytopenia, unspecified: Secondary | ICD-10-CM

## 2020-11-21 DIAGNOSIS — I2699 Other pulmonary embolism without acute cor pulmonale: Secondary | ICD-10-CM

## 2020-11-21 MED ORDER — ACETAMINOPHEN 650 MG RE SUPP
650.0000 mg | RECTAL | Status: DC | PRN
  Administered 2020-11-21: 650 mg via RECTAL
  Filled 2020-11-21: qty 1

## 2020-12-12 NOTE — Plan of Care (Signed)

## 2020-12-12 NOTE — Death Summary Note (Signed)
DEATH SUMMARY   Patient Details  Name: Lawrence [name] MRN: [id number] DOB: Jun 21, 1951  Admission/Discharge Information   Admit Date:  2020/11/03  Date of Death: Date of Death: 11-30-20  Time of Death: Time of Death: 02-27-39  Length of Stay: 03-17-23  Referring Physician: [name], MD   Reason(s) for Hospitalization  Respiratory failure  Diagnoses  Preliminary cause of death: Acute hypoxic respiratory failure due to covid-19 pneumonia Secondary Diagnoses (including complications and co-morbidities):  Principal Problem:   Pneumonia due to COVID-19 virus Active Problems:   Aortic atherosclerosis (HCC)   Thoracic aortic aneurysm [facility])   Essential hypertension   Aortic root aneurysm (HCC)   Atrial fibrillation, chronic (HCC)   Acute respiratory failure with hypoxia (HCC)   Hyperglycemia   Syncope and collapse   Anxiety state   Thrombocytopenia (HCC)   Pulmonary embolism (HCC)   Hyperlipidemia   Acquired thrombophilia (HCC)   Prolonged QT interval   LFT elevation  Brief Hospital Course (including significant findings, care, treatment, and services provided and events leading to death)  Lawrence [name] is a 70 y.o. male with a history of atrial fibrillation and aneurysm who presented Nov 04, 2023 with covid pneumonia which has been treated with remdesivir, solumedrol, and refused immunomodulator treatment. He remained severely hypoxemic requiring heated high flor oxygen for >3 weeks. Developed right sided chest pain on 11/18/2020 with abrupt rise in d-dimer. Clinical instability did not all CTA chest and the patient declined anticoagulation, stating he desired transition to comfort measures. After making appropriate arrangements, palliative care oversaw transition to dilaudid infusion and weaning of oxygen with eventual inpatient death on November 30, 2020.  Pertinent Labs and Studies  Significant Diagnostic Studies DG Chest 1 View  Result Date: 11/11/2020 CLINICAL DATA:  Shortness of breath,  COVID-19 positive. EXAM: CHEST  1 VIEW COMPARISON:  November 02, 2020. FINDINGS: Stable cardiomediastinal silhouette. Stable bilateral lung opacities are noted concerning for multifocal pneumonia. No pneumothorax or pleural effusion is noted. Sternotomy wires are noted. Bony thorax is unremarkable. IMPRESSION: Stable bilateral lung opacities are noted concerning for multifocal pneumonia. Electronically Signed   By: Marijo Conception M.D.   On: 11/11/2020 16:14   DG CHEST PORT 1 VIEW  Result Date: 11/18/2020 CLINICAL DATA:  70 year old male with history of COVID pneumonia. EXAM: PORTABLE CHEST 1 VIEW COMPARISON:  11/14/2020 FINDINGS: The heart size and mediastinal contours are unchanged. Similar appearing patchy pulmonary opacities in the mid lungs and bases bilaterally. No pleural effusion or pneumothorax. No acute osseous abnormality. IMPRESSION: Similar appearing bilateral pulmonary opacities in keeping with multifocal pneumonia associated with COVID-19. Electronically Signed   By: Ruthann Cancer MD   On: 11/18/2020 12:47   DG CHEST PORT 1 VIEW  Result Date: 11/14/2020 CLINICAL DATA:  Respiratory failure, COVID pneumonia EXAM: PORTABLE CHEST 1 VIEW COMPARISON:  11/11/2020 FINDINGS: Prior median sternotomy. Stable cardiomediastinal contours. Bilateral airspace opacities most pronounced within the mid lung fields, not appreciably changed from prior. No pleural effusion or pneumothorax. IMPRESSION: No significant interval change in bilateral airspace opacities. Electronically Signed   By: Davina Poke D.O.   On: 11/14/2020 11:48   DG CHEST PORT 1 VIEW  Result Date: 11/02/2020 CLINICAL DATA:  Shortness of breath, COVID-19 positivity EXAM: PORTABLE CHEST 1 VIEW COMPARISON:  10/30/2020 FINDINGS: Cardiac shadow is within normal limits. Postsurgical changes are seen. Increasing bilateral airspace opacities are noted consistent with the given clinical history. No sizable effusion is seen. No bony abnormality is  noted. IMPRESSION: Bilateral airspace opacities  increased in the interval from the prior exam consistent with the given clinical history. Electronically Signed   By: Inez Catalina M.D.   On: 11/02/2020 09:11   DG CHEST PORT 1 VIEW  Result Date: 10/30/2020 CLINICAL DATA:  Worsening hypoxia; covid pna EXAM: PORTABLE CHEST - 1 VIEW COMPARISON:  the previous day's study FINDINGS: Low lung volumes. Mild diffuse interstitial prominence. Perihilar scattered airspace opacities appears slightly increased on the left, marginally improved on the right. Heart size normal.  Tortuous thoracic aorta. No effusion.  No pneumothorax. Sternotomy wires. IMPRESSION: Slight improvement in bilateral airspace opacities. Electronically Signed   By: Lucrezia Europe M.D.   On: 10/30/2020 08:16   DG CHEST PORT 1 VIEW  Result Date: 10/29/2020 CLINICAL DATA:  History of COVID-19.  Hypoxia. EXAM: PORTABLE CHEST 1 VIEW COMPARISON:  Chest radiograph 10/20/2020. FINDINGS: Monitoring leads overlie the patient. Stable cardiac and mediastinal contours status post median sternotomy. Tortuosity of the thoracic aorta. Low lung volumes. Bilateral mid lower lung airspace opacities, slightly increased from prior. No pleural effusion or pneumothorax. IMPRESSION: Slight interval increase in bilateral mid lower lung airspace opacities. Electronically Signed   By: Lovey Newcomer M.D.   On: 10/29/2020 14:35   DG Chest Port 1 View  Result Date: 11/04/2020 CLINICAL DATA:  Cough.  Shortness of breath.  Chest pain. EXAM: PORTABLE CHEST 1 VIEW COMPARISON:  Radiograph 08/27/2018. CT 07/08/2019 FINDINGS: Low lung volumes. Post median sternotomy. Heart is normal in size. Similar aortic tortuosity. Mild peribronchial no interstitial thickening. Superimposed patchy opacity at the lung bases and right mid lung. No pneumothorax or pleural effusion. No acute osseous abnormalities are seen. IMPRESSION: 1. Patchy opacity at the lung bases and right mid lung, suspicious  for atelectasis versus pneumonia in the setting of cough. 2. Mild peribronchial thickening. Electronically Signed   By: Keith Rake M.D.   On: 10/20/2020 20:12   ECHOCARDIOGRAM COMPLETE  Result Date: 10/26/2020    ECHOCARDIOGRAM REPORT   Patient Name:   [name] Date of Exam: 10/26/2020 Medical Rec #:  [id number]       Height:       71.0 in Accession #:    [id number]      Weight:       214.0 lb Date of Birth:  1951/05/27        BSA:          2.170 m Patient Age:    67 years        BP:           169/94 mmHg Patient Gender: M               HR:           49 bpm. Exam Location:  [facility] Procedure: 2D Echo Indications:    Syncope 780.2 / R55  History:        Patient has prior history of Echocardiogram examinations, most                 recent 08/10/2018. Arrythmias:Atrial Fibrillation,                 Signs/Symptoms:Syncope; Risk Factors:Hypertension and Former                 Smoker. Pneumonia due to [place], Aortic root aneurysm.  Sonographer:    Leavy Cella RDCS (AE) Referring Phys: [id number] OLADAPO ADEFESO IMPRESSIONS  1. Left ventricular ejection fraction, by estimation, is 55 to 60%. The left ventricle has  normal function. The left ventricle has no regional wall motion abnormalities. There is mild left ventricular hypertrophy. Left ventricular diastolic parameters are indeterminate.  2. Right ventricular systolic function is normal. The right ventricular size is normal. Tricuspid regurgitation signal is inadequate for assessing PA pressure.  3. The mitral valve is grossly normal. Trivial mitral valve regurgitation.  4. The aortic valve is bicuspid. There is mild calcification of the aortic valve. Aortic valve regurgitation is trivial. Mild to moderate aortic valve sclerosis/calcification is present, without any evidence of aortic stenosis. Aortic valve mean gradient measures 7.3 mmHg. Aortic valve Vmax measures 1.96 m/s.  5. 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 Aortic  dilatation noted. There is mild dilatation of the aortic root, measuring 42 mm based on various views.  6. The inferior vena cava is normal in size with greater than 50% respiratory variability, suggesting right atrial pressure of 3 mmHg. FINDINGS  Left Ventricle: Left ventricular ejection fraction, by estimation, is 55 to 60%. The left ventricle has normal function. The left ventricle has no regional wall motion abnormalities. The left ventricular internal cavity size was normal in size. There is  mild left ventricular hypertrophy. Left ventricular diastolic parameters are indeterminate. Right Ventricle: The right ventricular size is normal. No increase in right ventricular wall thickness. Right ventricular systolic function is normal. Tricuspid regurgitation signal is inadequate for assessing PA pressure. Left Atrium: Left atrial size was normal in size. Right Atrium: Right atrial size was normal in size. Pericardium: There is no evidence of pericardial effusion. Mitral Valve: The mitral valve is grossly normal. Trivial mitral valve regurgitation. Tricuspid Valve: The tricuspid valve is grossly normal. Tricuspid valve regurgitation is trivial. Aortic Valve: The aortic valve is bicuspid. There is mild calcification of the aortic valve. There is mild aortic valve annular calcification. Aortic valve regurgitation is trivial. Mild to moderate aortic valve sclerosis/calcification is present, without any evidence of aortic stenosis. Aortic valve mean gradient measures 7.3 mmHg. Aortic valve peak gradient measures 15.4 mmHg. Aortic valve area, by VTI measures 2.83 cm. Pulmonic Valve: The pulmonic valve was grossly normal. Pulmonic valve regurgitation is trivial. Aorta: Status supracoronary placement of 32 mm Hemashield graft. Aortic dilatation noted. There is mild dilatation of the aortic root, measuring 42 mm. Venous: The inferior vena cava is normal in size with greater than 50% respiratory variability, suggesting  right atrial pressure of 3 mmHg. IAS/Shunts: No atrial level shunt detected by color flow Doppler.  LEFT VENTRICLE PLAX 2D LVIDd:         4.40 cm  Diastology LVIDs:         2.93 cm  LV e' medial:    5.98 cm/s LV PW:         1.14 cm  LV E/e' medial:  7.5 LV IVS:        0.90 cm  LV e' lateral:   8.92 cm/s LVOT diam:     2.20 cm  LV E/e' lateral: 5.0 LV SV:         104 LV SV Index:   48 LVOT Area:     3.80 cm  RIGHT VENTRICLE RV S prime:     12.80 cm/s TAPSE (M-mode): 2.9 cm LEFT ATRIUM             Index       RIGHT ATRIUM           Index LA diam:        3.20 cm 1.47 cm/m  RA  Area:     17.70 cm LA Vol (A2C):   42.6 ml 19.63 ml/m RA Volume:   46.10 ml  21.24 ml/m LA Vol (A4C):   18.6 ml 8.57 ml/m LA Biplane Vol: 29.2 ml 13.46 ml/m  AORTIC VALVE AV Area (Vmax):    2.33 cm AV Area (Vmean):   2.60 cm AV Area (VTI):     2.83 cm AV Vmax:           196.33 cm/s AV Vmean:          125.333 cm/s AV VTI:            0.369 m AV Peak Grad:      15.4 mmHg AV Mean Grad:      7.3 mmHg LVOT Vmax:         120.33 cm/s LVOT Vmean:        85.800 cm/s LVOT VTI:          0.274 m LVOT/AV VTI ratio: 0.74  AORTA Ao Root diam: 3.80 cm MITRAL VALVE MV Area (PHT): 2.22 cm    SHUNTS MV Decel Time: 342 msec    Systemic VTI:  0.27 m MV E velocity: 44.60 cm/s  Systemic Diam: 2.20 cm MV A velocity: 54.80 cm/s MV E/A ratio:  0.81 [name] MD Electronically signed by [name] MD Signature Date/Time: 10/26/2020/12:33:59 PM    Final    VAS [place] LOWER EXTREMITY VENOUS (DVT)  Result Date: 10/31/2020  Lower Venous DVT Study Indications: Elevated ddimer.  Performing Technologist: Abram Sander RVS  Examination Guidelines: A complete evaluation includes B-mode imaging, spectral Doppler, color Doppler, and power Doppler as needed of all accessible portions of each vessel. Bilateral testing is considered an integral part of a complete examination. Limited examinations for reoccurring indications may be performed as noted. The reflux  portion of the exam is performed with the patient in reverse Trendelenburg.  +---------+---------------+---------+-----------+----------+--------------+ RIGHT    CompressibilityPhasicitySpontaneityPropertiesThrombus Aging +---------+---------------+---------+-----------+----------+--------------+ CFV      Full           Yes      Yes                                 +---------+---------------+---------+-----------+----------+--------------+ SFJ      Full                                                        +---------+---------------+---------+-----------+----------+--------------+ FV Prox  Full                                                        +---------+---------------+---------+-----------+----------+--------------+ FV Mid   Full                                                        +---------+---------------+---------+-----------+----------+--------------+ FV DistalFull                                                        +---------+---------------+---------+-----------+----------+--------------+  PFV      Full                                                        +---------+---------------+---------+-----------+----------+--------------+ POP      Full           Yes      Yes                                 +---------+---------------+---------+-----------+----------+--------------+ PTV      Full                                                        +---------+---------------+---------+-----------+----------+--------------+ PERO     Full                                                        +---------+---------------+---------+-----------+----------+--------------+   +---------+---------------+---------+-----------+----------+--------------+ LEFT     CompressibilityPhasicitySpontaneityPropertiesThrombus Aging +---------+---------------+---------+-----------+----------+--------------+ CFV      Full           Yes      Yes                                  +---------+---------------+---------+-----------+----------+--------------+ SFJ      Full                                                        +---------+---------------+---------+-----------+----------+--------------+ FV Prox  Full                                                        +---------+---------------+---------+-----------+----------+--------------+ FV Mid   Full                                                        +---------+---------------+---------+-----------+----------+--------------+ FV DistalFull                                                        +---------+---------------+---------+-----------+----------+--------------+ PFV      Full                                                        +---------+---------------+---------+-----------+----------+--------------+   POP      Full           Yes      Yes                                 +---------+---------------+---------+-----------+----------+--------------+ PTV      Full                                                        +---------+---------------+---------+-----------+----------+--------------+ PERO     Full                                                        +---------+---------------+---------+-----------+----------+--------------+     Summary: BILATERAL: - No evidence of deep vein thrombosis seen in the lower extremities, bilaterally. - No evidence of superficial venous thrombosis in the lower extremities, bilaterally. -No evidence of popliteal cyst, bilaterally.   *See table(s) above for measurements and observations. Electronically signed by Harold Barban MD on 10/31/2020 at 6:07:36 PM.    Final     Microbiology No results found for this or any previous visit (from the past 240 hour(s)).  Lab Basic Metabolic Panel: Recent Labs  Lab 11/15/20 0709 11/16/20 1341  NA 137 137  K 4.2 4.1  CL 103 103  CO2 28 26  GLUCOSE 145* 220*  BUN  39* 39*  CREATININE 0.71 0.75  CALCIUM 8.0* 8.0*  MG 2.2  --    Liver Function Tests: Recent Labs  Lab 11/15/20 0709  AST 23  ALT 70*  ALKPHOS 45  BILITOT 1.2  PROT 4.4*  ALBUMIN 2.2*   No results for input(s): LIPASE, AMYLASE in the last 168 hours. No results for input(s): AMMONIA in the last 168 hours. CBC: Recent Labs  Lab 11/15/20 0709 11/16/20 1341 11/18/20 1145  WBC 10.3 10.9* 4.0  NEUTROABS 10.0*  --  3.7  HGB 14.7 13.9 14.8  HCT 43.2 40.6 44.1  MCV 93.1 92.1 93.6  PLT 82* 79* 75*   Sepsis Labs: Recent Labs  Lab 11/15/20 0709 11/16/20 1341 11/18/20 1145  PROCALCITON  --   --  <0.10  WBC 10.3 10.9* 4.0    Procedures/Operations  None   [name], MD December 08, 2020, 1:17 PM

## 2020-12-12 NOTE — Progress Notes (Signed)
Spoke with friend Lattie Haw [phone]) regarding patients belongings.  Patient had a cell phone, electric razor, mirror, grip strengthener, upper dentures and a cane in his room.  All these were placed in a clean personal belonging bag and taken to main entrance. Lattie Haw understands that they are there and she is to come and pick those items up.

## 2020-12-12 NOTE — Progress Notes (Signed)
Received call from pts sister today requesting pts belongings be mailed to her. There is no other family listed on chart. Spoke with Aloha Gell and she to go ahead and send belongings to his sister. Belongings to be sent via certified mail. Pt has no local family. He had 2 friends that visited and neither of them took belongings with them when pt expired.

## 2020-12-12 NOTE — Progress Notes (Signed)
Palliative care progress note/ Death pronouncement  I observed Lawrence [name] briefly through his window this AM.  No distress noted at that point, but significant apnea noted.  I then spent a few minutes reviewing his chart and discussed with bedside RN.  She reports him continuing to change this AM with signs indicative of impending death.  She was going to change current dilaudid infusion and I accompanied her to examine him.  On entering room, patient lying in bed unresponsive and not breathing.  Exam: No interaction with verbal or tactile stimulation. Does not withdraw to painful stimuli. No corneal or pupillary reflexes. No palpable central or peripheral pulses. No auscultatable respiratory or cardiac activity for greater than one minute.  Time of death: 52  Dr. Bonner Puna notified of patient death.  I called and notified Lawrence [name] sister, Lawrence [name] of his death via phone.  [name] reports that a local friend, Lawrence [name], is making funeral arrangements.  She stated that she wishes she could speak with Lawrence [name] but does not have his number.  I told her that I could not give her that information, but, with her permission, I could share her number with Lawrence [name] if he would like to reach out to her.  I then called and also notified friend, Lawrence [name], of patient's death via telephone.    Lawrence [name] has been working with several other local friends regarding funeral arrangements.  He will call Lawrence [name] friends to notify others of patient's death and determine which funeral home they would like his body released.   Time of death: 1 Will ask RN to follow up with Lawrence [name] regarding funeral home choice.  Greater than 50%  of this time was spent counseling and coordinating care related to the above assessment and plan. Total time: 25 minutes  [name], MD [place] Team [phone]

## 2020-12-12 DEATH — deceased

## 2021-03-04 IMAGING — DX DG CHEST 1V PORT
1 series · 1 of 1 positions shown · non-contrast
Comparison: 11/11/2020

CLINICAL DATA: Respiratory failure, COVID pneumonia

EXAM:
PORTABLE CHEST 1 VIEW

[chest ap]
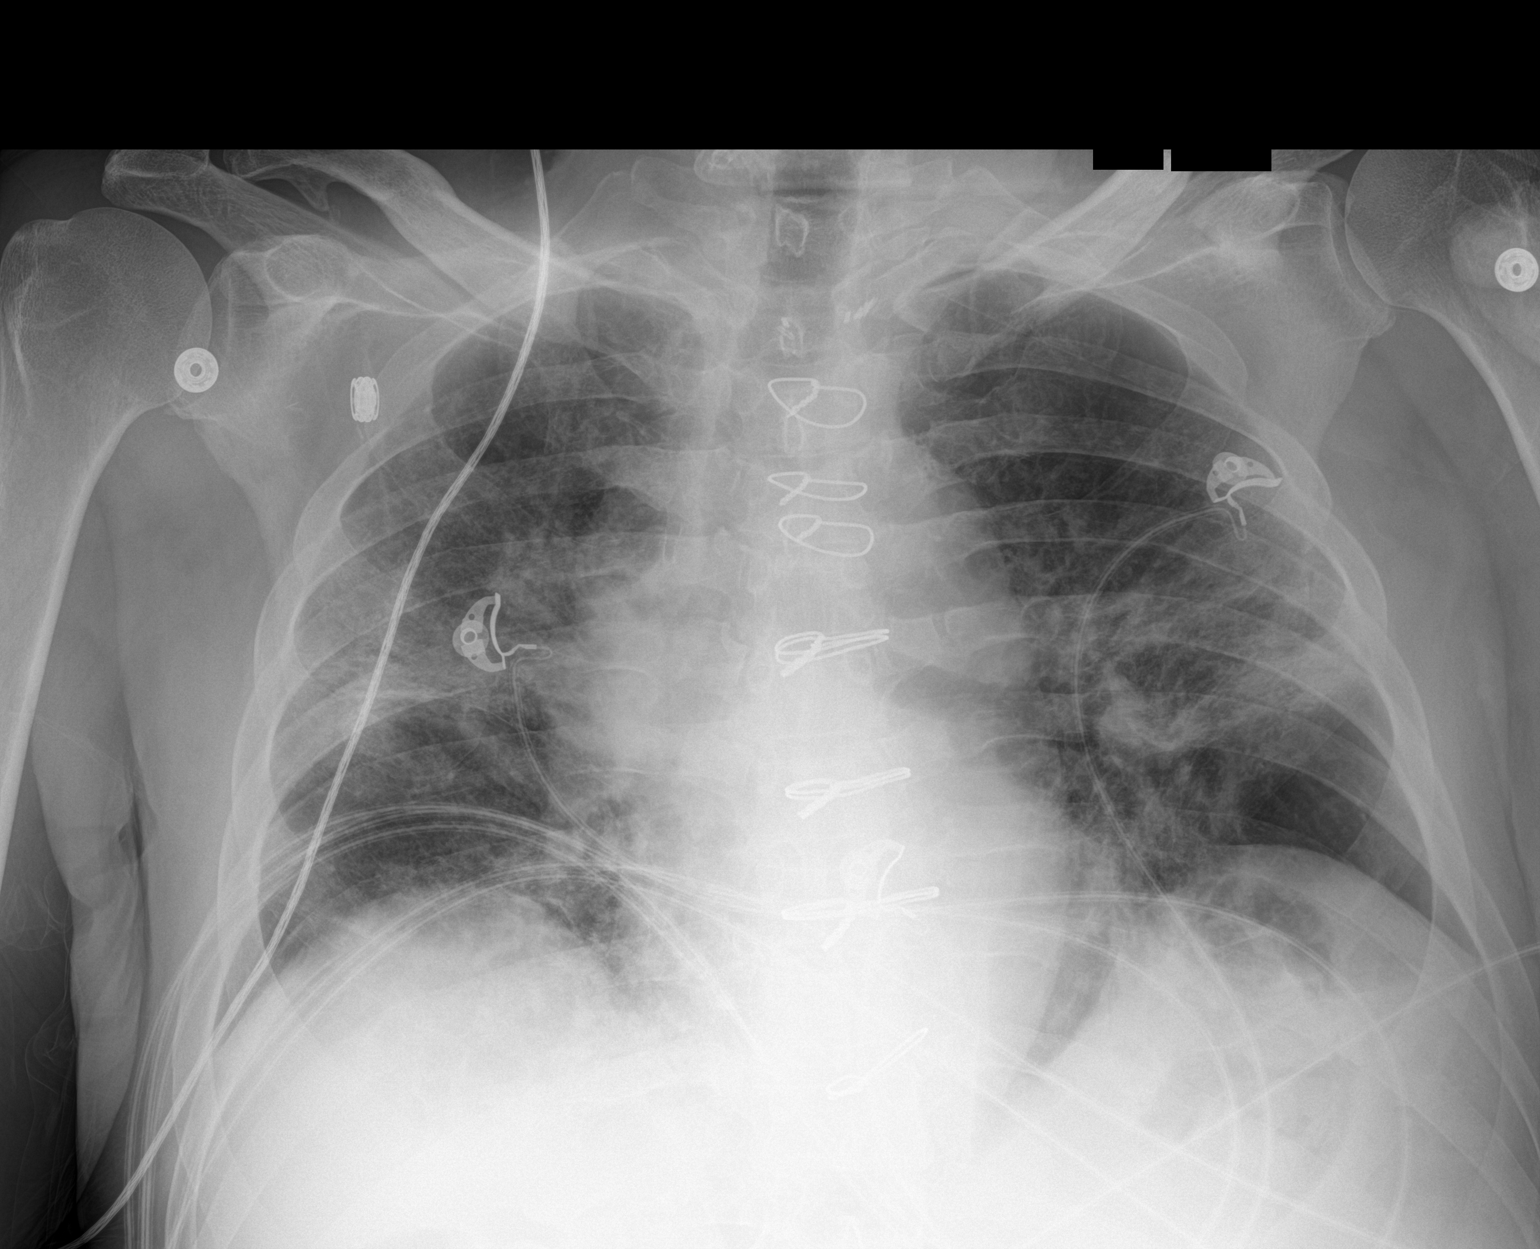

[1 of 1 positions shown; findings below may reference images not displayed]

FINDINGS: Prior median sternotomy. Stable cardiomediastinal contours.
Bilateral airspace opacities most pronounced within the mid lung
fields, not appreciably changed from prior. No pleural effusion or
pneumothorax.
IMPRESSION: No significant interval change in bilateral airspace opacities.
